# Patient Record
Sex: Female | Born: 1935 | Race: White | Hispanic: No | Marital: Married | Smoking: Never smoker
Health system: Southern US, Community
[De-identification: ages and names within clinical notes are randomized; demographics above are authoritative.]

## PROBLEM LIST (undated history)

## (undated) DIAGNOSIS — R10813 Right lower quadrant abdominal tenderness: Secondary | ICD-10-CM

## (undated) DIAGNOSIS — I10 Essential (primary) hypertension: Secondary | ICD-10-CM

## (undated) DIAGNOSIS — S4990XA Unspecified injury of shoulder and upper arm, unspecified arm, initial encounter: Secondary | ICD-10-CM

## (undated) DIAGNOSIS — H6120 Impacted cerumen, unspecified ear: Secondary | ICD-10-CM

## (undated) DIAGNOSIS — M109 Gout, unspecified: Secondary | ICD-10-CM

## (undated) DIAGNOSIS — G4701 Insomnia due to medical condition: Secondary | ICD-10-CM

## (undated) DIAGNOSIS — B9689 Other specified bacterial agents as the cause of diseases classified elsewhere: Secondary | ICD-10-CM

## (undated) DIAGNOSIS — M7061 Trochanteric bursitis, right hip: Secondary | ICD-10-CM

## (undated) DIAGNOSIS — G8929 Other chronic pain: Secondary | ICD-10-CM

## (undated) DIAGNOSIS — E785 Hyperlipidemia, unspecified: Secondary | ICD-10-CM

## (undated) DIAGNOSIS — N281 Cyst of kidney, acquired: Secondary | ICD-10-CM

## (undated) DIAGNOSIS — K5909 Other constipation: Secondary | ICD-10-CM

## (undated) DIAGNOSIS — N289 Disorder of kidney and ureter, unspecified: Secondary | ICD-10-CM

## (undated) DIAGNOSIS — E1129 Type 2 diabetes mellitus with other diabetic kidney complication: Secondary | ICD-10-CM

## (undated) DIAGNOSIS — M19019 Primary osteoarthritis, unspecified shoulder: Secondary | ICD-10-CM

## (undated) DIAGNOSIS — M5416 Radiculopathy, lumbar region: Secondary | ICD-10-CM

## (undated) DIAGNOSIS — M545 Low back pain, unspecified: Secondary | ICD-10-CM

## (undated) DIAGNOSIS — J209 Acute bronchitis, unspecified: Secondary | ICD-10-CM

## (undated) DIAGNOSIS — M25551 Pain in right hip: Secondary | ICD-10-CM

## (undated) DIAGNOSIS — K922 Gastrointestinal hemorrhage, unspecified: Secondary | ICD-10-CM

## (undated) DIAGNOSIS — H6690 Otitis media, unspecified, unspecified ear: Secondary | ICD-10-CM

## (undated) DIAGNOSIS — I4819 Other persistent atrial fibrillation: Secondary | ICD-10-CM

## (undated) DIAGNOSIS — K219 Gastro-esophageal reflux disease without esophagitis: Secondary | ICD-10-CM

## (undated) DIAGNOSIS — N185 Chronic kidney disease, stage 5: Secondary | ICD-10-CM

## (undated) DIAGNOSIS — R9431 Abnormal electrocardiogram [ECG] [EKG]: Secondary | ICD-10-CM

## (undated) DIAGNOSIS — D62 Acute posthemorrhagic anemia: Secondary | ICD-10-CM

## (undated) DIAGNOSIS — K625 Hemorrhage of anus and rectum: Secondary | ICD-10-CM

## (undated) DIAGNOSIS — D259 Leiomyoma of uterus, unspecified: Secondary | ICD-10-CM

## (undated) DIAGNOSIS — N390 Urinary tract infection, site not specified: Secondary | ICD-10-CM

## (undated) HISTORY — DX: Essential (primary) hypertension: I10

## (undated) HISTORY — DX: Low back pain, unspecified: M54.50

## (undated) HISTORY — DX: Disorder of kidney and ureter, unspecified: N28.9

## (undated) HISTORY — DX: Low back pain: M54.5

## (undated) HISTORY — DX: Hyperlipidemia, unspecified: E78.5

---
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
## 1997-06-12 ENCOUNTER — Ambulatory Visit (HOSPITAL_COMMUNITY): Admission: RE | Admit: 1997-06-12 | Discharge: 1997-06-12 | Payer: Self-pay | Admitting: Family Medicine

## 1997-06-20 ENCOUNTER — Other Ambulatory Visit: Admission: RE | Admit: 1997-06-20 | Discharge: 1997-06-20 | Payer: Self-pay | Admitting: Family Medicine

## 1998-08-07 ENCOUNTER — Other Ambulatory Visit: Admission: RE | Admit: 1998-08-07 | Discharge: 1998-08-07 | Payer: Self-pay | Admitting: Family Medicine

## 1999-09-02 ENCOUNTER — Other Ambulatory Visit: Admission: RE | Admit: 1999-09-02 | Discharge: 1999-09-02 | Payer: Self-pay | Admitting: Family Medicine

## 1999-10-28 ENCOUNTER — Encounter: Admission: RE | Admit: 1999-10-28 | Discharge: 1999-10-28 | Payer: Self-pay | Admitting: Family Medicine

## 1999-10-28 ENCOUNTER — Encounter: Payer: Self-pay | Admitting: Family Medicine

## 2001-06-04 ENCOUNTER — Other Ambulatory Visit: Admission: RE | Admit: 2001-06-04 | Discharge: 2001-06-04 | Payer: Self-pay | Admitting: Family Medicine

## 2001-06-15 ENCOUNTER — Encounter: Payer: Self-pay | Admitting: Family Medicine

## 2001-06-15 ENCOUNTER — Encounter: Admission: RE | Admit: 2001-06-15 | Discharge: 2001-06-15 | Payer: Self-pay | Admitting: Family Medicine

## 2002-07-10 ENCOUNTER — Other Ambulatory Visit: Admission: RE | Admit: 2002-07-10 | Discharge: 2002-07-10 | Payer: Self-pay | Admitting: Family Medicine

## 2002-07-22 ENCOUNTER — Encounter: Payer: Self-pay | Admitting: Family Medicine

## 2002-07-22 ENCOUNTER — Encounter: Admission: RE | Admit: 2002-07-22 | Discharge: 2002-07-22 | Payer: Self-pay | Admitting: Family Medicine

## 2003-02-24 LAB — HM COLONOSCOPY: HM Colonoscopy: NORMAL

## 2005-04-27 ENCOUNTER — Other Ambulatory Visit: Admission: RE | Admit: 2005-04-27 | Discharge: 2005-04-27 | Payer: Self-pay | Admitting: Family Medicine

## 2005-05-31 ENCOUNTER — Encounter: Admission: RE | Admit: 2005-05-31 | Discharge: 2005-05-31 | Payer: Self-pay | Admitting: Family Medicine

## 2006-09-10 ENCOUNTER — Encounter: Admission: RE | Admit: 2006-09-10 | Discharge: 2006-09-10 | Payer: Self-pay | Admitting: Family Medicine

## 2006-09-25 ENCOUNTER — Encounter: Admission: RE | Admit: 2006-09-25 | Discharge: 2006-09-25 | Payer: Self-pay | Admitting: Family Medicine

## 2007-09-28 ENCOUNTER — Other Ambulatory Visit: Admission: RE | Admit: 2007-09-28 | Discharge: 2007-09-28 | Payer: Self-pay | Admitting: Family Medicine

## 2007-09-28 ENCOUNTER — Encounter: Payer: Self-pay | Admitting: Internal Medicine

## 2007-10-03 LAB — CONVERTED CEMR LAB: Pap Smear: NORMAL

## 2007-11-01 ENCOUNTER — Encounter: Payer: Self-pay | Admitting: Internal Medicine

## 2007-11-01 ENCOUNTER — Encounter: Admission: RE | Admit: 2007-11-01 | Discharge: 2007-11-01 | Payer: Self-pay | Admitting: Family Medicine

## 2008-08-25 ENCOUNTER — Ambulatory Visit: Payer: Self-pay | Admitting: Internal Medicine

## 2008-08-25 DIAGNOSIS — N3281 Overactive bladder: Secondary | ICD-10-CM

## 2008-08-25 DIAGNOSIS — I10 Essential (primary) hypertension: Secondary | ICD-10-CM

## 2008-08-25 DIAGNOSIS — N185 Chronic kidney disease, stage 5: Secondary | ICD-10-CM | POA: Insufficient documentation

## 2008-08-25 DIAGNOSIS — M109 Gout, unspecified: Secondary | ICD-10-CM | POA: Insufficient documentation

## 2008-08-25 DIAGNOSIS — R9431 Abnormal electrocardiogram [ECG] [EKG]: Secondary | ICD-10-CM

## 2008-08-25 DIAGNOSIS — M546 Pain in thoracic spine: Secondary | ICD-10-CM

## 2008-08-25 DIAGNOSIS — E1129 Type 2 diabetes mellitus with other diabetic kidney complication: Secondary | ICD-10-CM

## 2008-08-25 HISTORY — DX: Chronic kidney disease, stage 5: N18.5

## 2008-08-25 HISTORY — DX: Abnormal electrocardiogram (ECG) (EKG): R94.31

## 2008-08-25 HISTORY — DX: Type 2 diabetes mellitus with other diabetic kidney complication: E11.29

## 2008-08-25 HISTORY — DX: Essential (primary) hypertension: I10

## 2008-08-25 HISTORY — DX: Gout, unspecified: M10.9

## 2008-08-25 LAB — CONVERTED CEMR LAB
ALT: 18 units/L (ref 0–35)
AST: 28 units/L (ref 0–37)
Albumin: 3.9 g/dL (ref 3.5–5.2)
Basophils Relative: 0.8 % (ref 0.0–3.0)
Eosinophils Relative: 6.3 % — ABNORMAL HIGH (ref 0.0–5.0)
GFR calc non Af Amer: 25.78 mL/min (ref >60)
Glucose, Bld: 108 mg/dL — ABNORMAL HIGH (ref 70–99)
HCT: 36.9 % (ref 36.0–46.0)
Hemoglobin: 12.6 g/dL (ref 12.0–15.0)
Ketones, ur: NEGATIVE mg/dL
LDL Cholesterol: 76 mg/dL (ref 0–99)
Lymphs Abs: 1.4 10*3/uL (ref 0.7–4.0)
Monocytes Relative: 6.7 % (ref 3.0–12.0)
Neutro Abs: 2.2 10*3/uL (ref 1.4–7.7)
Potassium: 3.9 meq/L (ref 3.5–5.1)
Sodium: 145 meq/L (ref 135–145)
Specific Gravity, Urine: 1.015 (ref 1.000–1.030)
TSH: 1 microintl units/mL (ref 0.35–5.50)
Urine Glucose: NEGATIVE mg/dL
Urobilinogen, UA: 0.2 (ref 0.0–1.0)
VLDL: 27.4 mg/dL (ref 0.0–40.0)
WBC: 4.2 10*3/uL — ABNORMAL LOW (ref 4.5–10.5)

## 2008-08-28 ENCOUNTER — Telehealth (INDEPENDENT_AMBULATORY_CARE_PROVIDER_SITE_OTHER): Payer: Self-pay | Admitting: *Deleted

## 2008-09-01 ENCOUNTER — Ambulatory Visit: Payer: Self-pay

## 2008-09-02 ENCOUNTER — Encounter: Payer: Self-pay | Admitting: Internal Medicine

## 2008-09-24 ENCOUNTER — Ambulatory Visit: Payer: Self-pay | Admitting: Internal Medicine

## 2008-10-22 ENCOUNTER — Ambulatory Visit: Payer: Self-pay | Admitting: Internal Medicine

## 2008-11-05 ENCOUNTER — Telehealth: Payer: Self-pay | Admitting: Internal Medicine

## 2008-11-10 ENCOUNTER — Encounter: Payer: Self-pay | Admitting: Internal Medicine

## 2008-12-08 ENCOUNTER — Telehealth: Payer: Self-pay | Admitting: Internal Medicine

## 2009-01-22 ENCOUNTER — Ambulatory Visit: Payer: Self-pay | Admitting: Internal Medicine

## 2009-01-22 LAB — CONVERTED CEMR LAB
BUN: 31 mg/dL — ABNORMAL HIGH (ref 6–23)
Basophils Absolute: 0.1 10*3/uL (ref 0.0–0.1)
Bilirubin, Direct: 0.1 mg/dL (ref 0.0–0.3)
Chloride: 105 meq/L (ref 96–112)
Cholesterol: 158 mg/dL (ref 0–200)
Creatinine, Ser: 1.6 mg/dL — ABNORMAL HIGH (ref 0.4–1.2)
Eosinophils Absolute: 0.3 10*3/uL (ref 0.0–0.7)
Eosinophils Relative: 5 % (ref 0.0–5.0)
Glucose, Bld: 104 mg/dL — ABNORMAL HIGH (ref 70–99)
Hgb A1c MFr Bld: 5.9 % (ref 4.6–6.5)
LDL Cholesterol: 92 mg/dL (ref 0–99)
Leukocytes, UA: NEGATIVE
MCHC: 33.7 g/dL (ref 30.0–36.0)
MCV: 99.5 fL (ref 78.0–100.0)
Monocytes Absolute: 0.4 10*3/uL (ref 0.1–1.0)
Neutrophils Relative %: 52 % (ref 43.0–77.0)
Nitrite: NEGATIVE
Platelets: 211 10*3/uL (ref 150.0–400.0)
RDW: 13.1 % (ref 11.5–14.6)
Specific Gravity, Urine: 1.02 (ref 1.000–1.030)
Total Bilirubin: 0.9 mg/dL (ref 0.3–1.2)
Total Protein, Urine: 100 mg/dL
Triglycerides: 132 mg/dL (ref 0.0–149.0)
WBC: 5.3 10*3/uL (ref 4.5–10.5)
pH: 5.5 (ref 5.0–8.0)

## 2009-01-23 ENCOUNTER — Encounter: Payer: Self-pay | Admitting: Internal Medicine

## 2009-02-18 ENCOUNTER — Encounter: Payer: Self-pay | Admitting: Internal Medicine

## 2009-02-23 ENCOUNTER — Telehealth: Payer: Self-pay | Admitting: Internal Medicine

## 2009-04-02 ENCOUNTER — Telehealth: Payer: Self-pay | Admitting: Internal Medicine

## 2009-05-27 ENCOUNTER — Ambulatory Visit: Payer: Self-pay | Admitting: Internal Medicine

## 2009-05-27 LAB — CONVERTED CEMR LAB
ALT: 18 U/L (ref 0–35)
AST: 25 U/L (ref 0–37)
Albumin: 3.7 g/dL (ref 3.5–5.2)
Alkaline Phosphatase: 77 U/L (ref 39–117)
BUN: 32 mg/dL — ABNORMAL HIGH (ref 6–23)
Basophils Absolute: 0 K/uL (ref 0.0–0.1)
Basophils Relative: 0.7 % (ref 0.0–3.0)
Bilirubin Urine: NEGATIVE
Bilirubin, Direct: 0.2 mg/dL (ref 0.0–0.3)
CO2: 33 meq/L — ABNORMAL HIGH (ref 19–32)
Calcium: 9.8 mg/dL (ref 8.4–10.5)
Chloride: 106 meq/L (ref 96–112)
Cholesterol: 160 mg/dL (ref 0–200)
Creatinine, Ser: 1.8 mg/dL — ABNORMAL HIGH (ref 0.4–1.2)
Eosinophils Absolute: 0.2 K/uL (ref 0.0–0.7)
Eosinophils Relative: 4.5 % (ref 0.0–5.0)
GFR calc non Af Amer: 29.06 mL/min (ref >60)
Glucose, Bld: 111 mg/dL — ABNORMAL HIGH (ref 70–99)
HCT: 39.1 % (ref 36.0–46.0)
HDL: 54.1 mg/dL (ref >39.00)
Hemoglobin: 12.9 g/dL (ref 12.0–15.0)
Hgb A1c MFr Bld: 5.9 % (ref 4.6–6.5)
Ketones, ur: NEGATIVE mg/dL
LDL Cholesterol: 79 mg/dL (ref 0–99)
Leukocytes, UA: NEGATIVE
Lymphocytes Relative: 35 % (ref 12.0–46.0)
Lymphs Abs: 1.6 K/uL (ref 0.7–4.0)
MCHC: 33.1 g/dL (ref 30.0–36.0)
MCV: 96.5 fL (ref 78.0–100.0)
Monocytes Absolute: 0.3 K/uL (ref 0.1–1.0)
Monocytes Relative: 7.4 % (ref 3.0–12.0)
Neutro Abs: 2.5 K/uL (ref 1.4–7.7)
Neutrophils Relative %: 52.4 % (ref 43.0–77.0)
Nitrite: NEGATIVE
Platelets: 189 K/uL (ref 150.0–400.0)
Potassium: 4.3 meq/L (ref 3.5–5.1)
RBC: 4.05 M/uL (ref 3.87–5.11)
RDW: 12.5 % (ref 11.5–14.6)
Sodium: 145 meq/L (ref 135–145)
Specific Gravity, Urine: >=1.03 (ref 1.000–1.030)
TSH: 3 u[IU]/mL (ref 0.35–5.50)
Total Bilirubin: 0.4 mg/dL (ref 0.3–1.2)
Total CHOL/HDL Ratio: 3
Total Protein, Urine: 100 mg/dL
Total Protein: 6.7 g/dL (ref 6.0–8.3)
Triglycerides: 134 mg/dL (ref 0.0–149.0)
Uric Acid, Serum: 6.1 mg/dL (ref 2.4–7.0)
Urine Glucose: NEGATIVE mg/dL
Urobilinogen, UA: 0.2 (ref 0.0–1.0)
VLDL: 26.8 mg/dL (ref 0.0–40.0)
WBC: 4.6 10*3/microliter (ref 4.5–10.5)
pH: 6 (ref 5.0–8.0)

## 2009-08-13 LAB — HM MAMMOGRAPHY: HM Mammogram: NORMAL

## 2009-09-29 ENCOUNTER — Ambulatory Visit: Payer: Self-pay | Admitting: Internal Medicine

## 2009-09-29 LAB — CONVERTED CEMR LAB
BUN: 43 mg/dL — ABNORMAL HIGH (ref 6–23)
CO2: 33 meq/L — ABNORMAL HIGH (ref 19–32)
Calcium: 9.9 mg/dL (ref 8.4–10.5)
Creatinine, Ser: 2.1 mg/dL — ABNORMAL HIGH (ref 0.4–1.2)
GFR calc non Af Amer: 24.04 mL/min (ref >60)
Glucose, Bld: 101 mg/dL — ABNORMAL HIGH (ref 70–99)
Sodium: 144 meq/L (ref 135–145)

## 2009-09-30 ENCOUNTER — Encounter: Payer: Self-pay | Admitting: Internal Medicine

## 2009-10-20 ENCOUNTER — Telehealth: Payer: Self-pay | Admitting: Internal Medicine

## 2009-11-02 ENCOUNTER — Encounter
Admission: RE | Admit: 2009-11-02 | Discharge: 2010-01-31 | Payer: Self-pay | Source: Home / Self Care | Admitting: Physical Medicine & Rehabilitation

## 2009-11-09 ENCOUNTER — Ambulatory Visit: Payer: Self-pay | Admitting: Physical Medicine & Rehabilitation

## 2009-12-03 ENCOUNTER — Ambulatory Visit: Payer: Self-pay | Admitting: Physical Medicine & Rehabilitation

## 2009-12-30 ENCOUNTER — Ambulatory Visit: Payer: Self-pay | Admitting: Internal Medicine

## 2009-12-30 DIAGNOSIS — E785 Hyperlipidemia, unspecified: Secondary | ICD-10-CM | POA: Insufficient documentation

## 2009-12-30 HISTORY — DX: Hyperlipidemia, unspecified: E78.5

## 2009-12-30 LAB — CONVERTED CEMR LAB
ALT: 16 units/L (ref 0–35)
Albumin: 3.6 g/dL (ref 3.5–5.2)
Alkaline Phosphatase: 72 units/L (ref 39–117)
Basophils Relative: 1 % (ref 0.0–3.0)
Bilirubin Urine: NEGATIVE
Bilirubin, Direct: 0.1 mg/dL (ref 0.0–0.3)
CO2: 31 meq/L (ref 19–32)
Calcium: 10.1 mg/dL (ref 8.4–10.5)
Chloride: 110 meq/L (ref 96–112)
Creatinine, Ser: 2 mg/dL — ABNORMAL HIGH (ref 0.4–1.2)
Eosinophils Relative: 5.7 % — ABNORMAL HIGH (ref 0.0–5.0)
Hemoglobin: 12.8 g/dL (ref 12.0–15.0)
Ketones, ur: NEGATIVE mg/dL
LDL Cholesterol: 92 mg/dL (ref 0–99)
Lymphocytes Relative: 34.8 % (ref 12.0–46.0)
MCV: 98.2 fL (ref 78.0–100.0)
Neutro Abs: 2.8 10*3/uL (ref 1.4–7.7)
Neutrophils Relative %: 51.8 % (ref 43.0–77.0)
RBC: 3.92 M/uL (ref 3.87–5.11)
Sodium: 148 meq/L — ABNORMAL HIGH (ref 135–145)
Total CHOL/HDL Ratio: 4
Total Protein, Urine: 100 mg/dL
Total Protein: 6.4 g/dL (ref 6.0–8.3)
Triglycerides: 140 mg/dL (ref 0.0–149.0)
Urine Glucose: NEGATIVE mg/dL
WBC: 5.4 10*3/uL (ref 4.5–10.5)
pH: 6 (ref 5.0–8.0)

## 2010-01-05 ENCOUNTER — Ambulatory Visit: Payer: Self-pay | Admitting: Physical Medicine & Rehabilitation

## 2010-04-01 ENCOUNTER — Encounter
Admission: RE | Admit: 2010-04-01 | Discharge: 2010-04-06 | Payer: Self-pay | Source: Home / Self Care | Attending: Physical Medicine & Rehabilitation | Admitting: Physical Medicine & Rehabilitation

## 2010-04-02 ENCOUNTER — Ambulatory Visit
Admission: RE | Admit: 2010-04-02 | Discharge: 2010-04-02 | Payer: Self-pay | Source: Home / Self Care | Attending: Internal Medicine | Admitting: Internal Medicine

## 2010-04-02 ENCOUNTER — Other Ambulatory Visit: Payer: Self-pay | Admitting: Internal Medicine

## 2010-04-02 LAB — HEPATIC FUNCTION PANEL
ALT: 17 U/L (ref 0–35)
AST: 21 U/L (ref 0–37)
Albumin: 3.9 g/dL (ref 3.5–5.2)
Alkaline Phosphatase: 69 U/L (ref 39–117)
Bilirubin, Direct: 0.1 mg/dL (ref 0.0–0.3)
Total Bilirubin: 0.6 mg/dL (ref 0.3–1.2)
Total Protein: 6.8 g/dL (ref 6.0–8.3)

## 2010-04-02 LAB — BASIC METABOLIC PANEL
BUN: 32 mg/dL — ABNORMAL HIGH (ref 6–23)
CO2: 32 mEq/L (ref 19–32)
Calcium: 9.7 mg/dL (ref 8.4–10.5)
Chloride: 103 mEq/L (ref 96–112)
Creatinine, Ser: 1.9 mg/dL — ABNORMAL HIGH (ref 0.4–1.2)
GFR: 27.92 mL/min — ABNORMAL LOW (ref >60.00)
Glucose, Bld: 105 mg/dL — ABNORMAL HIGH (ref 70–99)
Potassium: 4.1 mEq/L (ref 3.5–5.1)
Sodium: 145 mEq/L (ref 135–145)

## 2010-04-02 LAB — URIC ACID: Uric Acid, Serum: 5.8 mg/dL (ref 2.4–7.0)

## 2010-04-02 LAB — HEMOGLOBIN A1C: Hgb A1c MFr Bld: 5.8 % (ref 4.6–6.5)

## 2010-04-06 ENCOUNTER — Ambulatory Visit
Admission: RE | Admit: 2010-04-06 | Discharge: 2010-04-06 | Payer: Self-pay | Source: Home / Self Care | Attending: Physical Medicine & Rehabilitation | Admitting: Physical Medicine & Rehabilitation

## 2010-04-13 NOTE — Assessment & Plan Note (Signed)
Summary: 3 MONTH FOLLOW UP-LB   Vital Signs:  Patient profile:   77 year old female Menstrual status:  postmenopausal Height:      66 inches Weight:      215 pounds BMI:     34.83 O2 Sat:      97 % on Room air Temp:     97.8 degrees F oral Pulse rate:   64 / minute Pulse rhythm:   regular Resp:     16 per minute BP sitting:   128 / 70  (left arm) Cuff size:   large  Vitals Entered By: Estell Harpin CMA (December 30, 2009 9:39 AM)  Nutrition Counseling: Patient's BMI is greater than 25 and therefore counseled on weight management options.  O2 Flow:  Room air CC: follow-up visit, Preventive Care Is Patient Diabetic? Yes Did you bring your meter with you today? No Pain Assessment Patient in pain? no       Does patient need assistance? Functional Status Self care Ambulation Normal     Menstrual Status postmenopausal Last PAP Result Normal   Primary Care Provider:  [name] MD  CC:  follow-up visit and Preventive Care.  History of Present Illness:  Follow-Up Visit      This is a 77 year old woman who presents for Follow-up visit.  The patient denies chest pain, palpitations, dizziness, syncope, low blood sugar symptoms, high blood sugar symptoms, edema, SOB, DOE, PND, and orthopnea.  Since the last visit the patient notes no new problems or concerns.  The patient reports taking meds as prescribed, monitoring BP, monitoring blood sugars, and dietary compliance.  When questioned about possible medication side effects, the patient notes none.    Preventive Screening-Counseling & Management  Alcohol-Tobacco     Alcohol drinks/day: 0     Smoking Status: never     Tobacco Counseling: not indicated; no tobacco use  Hep-HIV-STD-Contraception     Hepatitis Risk: no risk noted     HIV Risk: no risk noted     STD Risk: no risk noted      Drug Use:  no.    Clinical Review Panels:  Prevention   Last Mammogram:  Normal Bilateral (08/13/2009)   Last Pap Smear:   Normal (10/03/2007)   Last Colonoscopy:  Normal (02/24/2003)  Immunizations   Last Tetanus Booster:  Tdap (08/25/2008)   Last Pneumovax:  Pneumovax (08/25/2008)  Lipid Management   Cholesterol:  160 (05/27/2009)   LDL (bad choesterol):  79 (05/27/2009)   HDL (good cholesterol):  54.10 (05/27/2009)  Diabetes Management   HgBA1C:  5.8 (09/29/2009)   Creatinine:  2.1 (09/29/2009)   Last Dilated Eye Exam:  normal (02/18/2009)   Last Foot Exam:  yes (12/30/2009)   Last Pneumovax:  Pneumovax (08/25/2008)  CBC   WBC:  4.6 (05/27/2009)   RBC:  4.05 (05/27/2009)   Hgb:  12.9 (05/27/2009)   Hct:  39.1 (05/27/2009)   Platelets:  189.0 (05/27/2009)   MCV  96.5 (05/27/2009)   MCHC  33.1 (05/27/2009)   RDW  12.5 (05/27/2009)   PMN:  52.4 (05/27/2009)   Lymphs:  35.0 (05/27/2009)   Monos:  7.4 (05/27/2009)   Eosinophils:  4.5 (05/27/2009)   Basophil:  0.7 (05/27/2009)  Complete Metabolic Panel   Glucose:  101 (09/29/2009)   Sodium:  144 (09/29/2009)   Potassium:  4.2 (09/29/2009)   Chloride:  108 (09/29/2009)   CO2:  33 (09/29/2009)   BUN:  43 (09/29/2009)   Creatinine:  2.1 (09/29/2009)   Albumin:  3.7 (05/27/2009)   Total Protein:  6.7 (05/27/2009)   Calcium:  9.9 (09/29/2009)   Total Bili:  0.4 (05/27/2009)   Alk Phos:  77 (05/27/2009)   SGPT (ALT):  18 (05/27/2009)   SGOT (AST):  25 (05/27/2009)   Medications Prior to Update: 1)  Lisinopril-Hydrochlorothiazide 20-25 Mg Tabs (Lisinopril-Hydrochlorothiazide) .... Take 1 Tablet By Mouth Once A Day 2)  Allopurinol 300 Mg Tabs (Allopurinol) .... 1/2 Once Daily 3)  Centrium Silver 4)  Prilosec Otc 5)  Klonopin 0.5 Mg Tabs (Clonazepam) .... One By Mouth Two Times A Day As Needed For Anxiety 6)  Bystolic 5 Mg Tabs (Nebivolol Hcl) .... Once Daily 7)  Bayer Contour Monitor W/device Kit (Blood Glucose Monitoring Suppl) .... Use Bid 8)  Bayer Contour Test  Strp (Glucose Blood) .... Use Two Times A Day 9)  Actos 15 Mg Tabs  (Pioglitazone Hcl) .... Once Daily 10)  Tramadol Hcl 50 Mg Tabs (Tramadol Hcl) .Marland Kitchen.. 1-2 By Mouth Qid As Needed For Low Back Pain  Current Medications (verified): 1)  Lisinopril-Hydrochlorothiazide 20-25 Mg Tabs (Lisinopril-Hydrochlorothiazide) .... Take 1 Tablet By Mouth Once A Day 2)  Allopurinol 300 Mg Tabs (Allopurinol) .... 1/2 Once Daily 3)  Centrium Silver 4)  Prilosec Otc 5)  Klonopin 0.5 Mg Tabs (Clonazepam) .... One By Mouth Two Times A Day As Needed For Anxiety 6)  Bystolic 5 Mg Tabs (Nebivolol Hcl) .... Once Daily 7)  Bayer Contour Monitor W/device Kit (Blood Glucose Monitoring Suppl) .... Use Bid 8)  Bayer Contour Test  Strp (Glucose Blood) .... Use Two Times A Day 9)  Tramadol Hcl 50 Mg Tabs (Tramadol Hcl) .Marland Kitchen.. 1-2 By Mouth Qid As Needed For Low Back Pain 10)  Januvia 100 Mg Tabs (Sitagliptin Phosphate) .... One By Mouth Once Daily For Diabetes 11)  Crestor 10 Mg Tabs (Rosuvastatin Calcium) .... One By Mouth Once Daily For Cholesterol  Allergies (verified): 1)  ! Metformin Hcl  Past History:  Past Surgical History: Last updated: 08/25/2008 Denies surgical history  Family History: Last updated: 09/24/2008 Family History Diabetes 1st degree relative Family History Hypertension  Social History: Last updated: 09/24/2008 Retired Never Smoked Alcohol use-no Drug use-no Regular exercise-no  Risk Factors: Alcohol Use: 0 (12/30/2009) Exercise: no (09/24/2008)  Risk Factors: Smoking Status: never (12/30/2009)  Past Medical History: Diabetes mellitus, type II Gout Hypertension Low back pain- foraminal narrowing at L4-5 and disc bulge at L5-S1 on MRI 08/2006 Renal insufficiency Urinary incontinence Hyperlipidemia  Family History: Reviewed history from 09/24/2008 and no changes required. Family History Diabetes 1st degree relative Family History Hypertension  Social History: Reviewed history from 09/24/2008 and no changes required. Retired Never  Smoked Alcohol use-no Drug use-no Regular exercise-no Hepatitis Risk:  no risk noted HIV Risk:  no risk noted STD Risk:  no risk noted  Review of Systems  The patient denies anorexia, fever, weight loss, weight gain, hoarseness, chest pain, syncope, dyspnea on exertion, prolonged cough, headaches, hemoptysis, abdominal pain, hematuria, suspicious skin lesions, difficulty walking, depression, enlarged lymph nodes, and angioedema.   MS:  Denies joint pain, joint redness, joint swelling, loss of strength, low back pain, muscle aches, muscle, cramps, and stiffness. Endo:  Denies cold intolerance, excessive hunger, excessive thirst, excessive urination, heat intolerance, polyuria, and weight change.  Physical Exam  General:  alert, well-developed, well-nourished, well-hydrated, cooperative to examination, good hygiene, and overweight-appearing.   Head:  normocephalic and atraumatic.   Mouth:  Oral mucosa and oropharynx without  lesions or exudates.  Teeth in good repair. Neck:  supple, full ROM, no masses, no carotid bruits, no cervical lymphadenopathy, and no neck tenderness.   Lungs:  normal respiratory effort, no intercostal retractions, no accessory muscle use, normal breath sounds, and no dullness.   Heart:  Normal rate and regular rhythm. S1 and S2 normal without gallop, murmur, click, rub or other extra sounds. Abdomen:  Bowel sounds positive,abdomen soft and non-tender without masses, organomegaly or hernias noted. Msk:  normal ROM, no joint tenderness, no joint swelling, and no joint warmth.   Pulses:  R and L carotid,radial,femoral,dorsalis pedis and posterior tibial pulses are full and equal bilaterally Extremities:  trace left pedal edema and trace right pedal edema.   Neurologic:  alert & oriented X3, cranial nerves II-XII intact, strength normal in all extremities, sensation intact to light touch, sensation intact to pinprick, gait normal, and DTRs symmetrical and normal.   Skin:   Intact without suspicious lesions or rashes Cervical Nodes:  no anterior cervical adenopathy and no posterior cervical adenopathy.   Psych:  Cognition and judgment appear intact. Alert and cooperative with normal attention span and concentration. No apparent delusions, illusions, hallucinations  Diabetes Management Exam:    Foot Exam (with socks and/or shoes not present):       Sensory-Pinprick/Light touch:          Left medial foot (L-4): normal          Left dorsal foot (L-5): normal          Left lateral foot (S-1): normal          Right medial foot (L-4): normal          Right dorsal foot (L-5): normal          Right lateral foot (S-1): normal       Sensory-Monofilament:          Left foot: normal          Right foot: normal       Inspection:          Left foot: normal          Right foot: normal       Nails:          Left foot: normal          Right foot: normal   Impression & Recommendations:  Problem # 1:  RENAL INSUFFICIENCY (ICD-588.9) Assessment Unchanged  Orders: Venipuncture [id number]) TLB-Lipid Panel (80061-LIPID) TLB-BMP (Basic Metabolic Panel-BMET) (99991111) TLB-CBC Platelet - w/Differential (85025-CBCD) TLB-Hepatic/Liver Function Pnl (80076-HEPATIC) TLB-TSH (Thyroid Stimulating Hormone) (84443-TSH) TLB-A1C / Hgb A1C (Glycohemoglobin) (83036-A1C) TLB-Udip w/ Micro (81001-URINE)  Problem # 2:  HYPERTENSION (ICD-401.9) Assessment: Improved  Her updated medication list for this problem includes:    Lisinopril-hydrochlorothiazide 20-25 Mg Tabs (Lisinopril-hydrochlorothiazide) .Marland Kitchen... Take 1 tablet by mouth once a day    Bystolic 5 Mg Tabs (Nebivolol hcl) ..... Once daily  Orders: Venipuncture [id number]) TLB-Lipid Panel (80061-LIPID) TLB-BMP (Basic Metabolic Panel-BMET) (99991111) TLB-CBC Platelet - w/Differential (85025-CBCD) TLB-Hepatic/Liver Function Pnl (80076-HEPATIC) TLB-TSH (Thyroid Stimulating Hormone) (84443-TSH) TLB-A1C / Hgb A1C  (Glycohemoglobin) (83036-A1C) TLB-Udip w/ Micro (81001-URINE)  BP today: 128/70 Prior BP: 122/84 (09/29/2009)  Prior 10 Yr Risk Heart Disease: 17 % (05/27/2009)  Labs Reviewed: K+: 4.2 (09/29/2009) Creat: : 2.1 (09/29/2009)   Chol: 160 (05/27/2009)   HDL: 54.10 (05/27/2009)   LDL: 79 (05/27/2009)   TG: 134.0 (05/27/2009)  Problem # 3:  DIABETES MELLITUS, TYPE II (ICD-250.00) Assessment: Unchanged  The  following medications were removed from the medication list:    Actos 15 Mg Tabs (Pioglitazone hcl) ..... Once daily Her updated medication list for this problem includes:    Lisinopril-hydrochlorothiazide 20-25 Mg Tabs (Lisinopril-hydrochlorothiazide) .Marland Kitchen... Take 1 tablet by mouth once a day    Januvia 100 Mg Tabs (Sitagliptin phosphate) ..... One by mouth once daily for diabetes  Orders: Venipuncture [id number]) TLB-Lipid Panel (80061-LIPID) TLB-BMP (Basic Metabolic Panel-BMET) (99991111) TLB-CBC Platelet - w/Differential (85025-CBCD) TLB-Hepatic/Liver Function Pnl (80076-HEPATIC) TLB-TSH (Thyroid Stimulating Hormone) (84443-TSH) TLB-A1C / Hgb A1C (Glycohemoglobin) (83036-A1C) TLB-Udip w/ Micro (81001-URINE)  Labs Reviewed: Creat: 2.1 (09/29/2009)     Last Eye Exam: normal (02/18/2009) Reviewed HgBA1c results: 5.8 (09/29/2009)  5.9 (05/27/2009)  Problem # 4:  GOUT (ICD-274.9) Assessment: Improved  Her updated medication list for this problem includes:    Allopurinol 300 Mg Tabs (Allopurinol) .Marland Kitchen... 1/2 once daily  Elevate extremity; warm compresses, symptomatic relief and medication as directed.   Problem # 5:  HYPERLIPIDEMIA (B2193296.4) Assessment: Unchanged  Her updated medication list for this problem includes:    Crestor 10 Mg Tabs (Rosuvastatin calcium) ..... One by mouth once daily for cholesterol  Labs Reviewed: SGOT: 25 (05/27/2009)   SGPT: 18 (05/27/2009)  Prior 10 Yr Risk Heart Disease: 17 % (05/27/2009)   HDL:54.10 (05/27/2009), 39.90 (01/22/2009)   LDL:79 (05/27/2009), 92 (01/22/2009)  Chol:160 (05/27/2009), 158 (01/22/2009)  Trig:134.0 (05/27/2009), 132.0 (01/22/2009)  Complete Medication List: 1)  Lisinopril-hydrochlorothiazide 20-25 Mg Tabs (Lisinopril-hydrochlorothiazide) .... Take 1 tablet by mouth once a day 2)  Allopurinol 300 Mg Tabs (Allopurinol) .... 1/2 once daily 3)  Centrium Silver  4)  Prilosec Otc  5)  Klonopin 0.5 Mg Tabs (Clonazepam) .... One by mouth two times a day as needed for anxiety 6)  Bystolic 5 Mg Tabs (Nebivolol hcl) .... Once daily 7)  Landscape architect W/device Kit (Blood glucose monitoring suppl) .... Use bid 8)  Bayer Contour Test Strp (Glucose blood) .... Use two times a day 9)  Tramadol Hcl 50 Mg Tabs (Tramadol hcl) .Marland Kitchen.. 1-2 by mouth qid as needed for low back pain 10)  Januvia 100 Mg Tabs (Sitagliptin phosphate) .... One by mouth once daily for diabetes 11)  Crestor 10 Mg Tabs (Rosuvastatin calcium) .... One by mouth once daily for cholesterol  Colorectal Screening:  Current Recommendations:    Colonoscopy recommended: patient defers today but will consider in the future  PAP Screening:    Hx Cervical Dysplasia in last 5 yrs? No    3 normal PAP smears in last 5 yrs? Yes    Last PAP smear:  10/03/2007    Reviewed PAP smear recommendations:  patient refuses understanding risks of delayed diagnosis  Mammogram Screening:    Last Mammogram:  08/13/2009  Mammogram Results:    Date of Exam:  08/13/2009    Results:  Normal Bilateral  Osteoporosis Risk Assessment:  Risk Factors for Fracture or Low Bone Density:   Race (White or Asian):     yes   Smoking status:       never  Immunization & Chemoprophylaxis:    Tetanus vaccine: Tdap  (08/25/2008)    Pneumovax: Pneumovax  (08/25/2008)  Patient Instructions: 1)  Please schedule a follow-up appointment in 3 months. 2)  It is important that you exercise regularly at least 20 minutes 5 times a week. If you develop chest pain, have severe  difficulty breathing, or feel very tired , stop exercising immediately and seek medical attention. 3)  You need to  lose weight. Consider a lower calorie diet and regular exercise.  4)  Check your blood sugars regularly. If your readings are usually above 200 or below 70 you should contact our office. 5)  It is important that your Diabetic A1c level is checked every 3 months. 6)  See your eye doctor yearly to check for diabetic eye damage. 7)  Check your feet each night for sore areas, calluses or signs of infection. 8)  Check your Blood Pressure regularly. If it is above 130/80: you should make an appointment. Prescriptions: CRESTOR 10 MG TABS (ROSUVASTATIN CALCIUM) One by mouth once daily for cholesterol  #84 x 0   Entered and Authorized by:   [name] MD   Signed by:   [name] MD on 12/30/2009   Method used:   Samples Given   RxID:   [id number] JANUVIA 100 MG TABS (SITAGLIPTIN PHOSPHATE) One by mouth once daily for diabetes  #140 x 0   Entered and Authorized by:   [name] MD   Signed by:   [name] MD on 12/30/2009   Method used:   Samples Given   RxID:   [id number]    Orders Added: 1)  Venipuncture [id number] 2)  TLB-Lipid Panel [80061-LIPID] 3)  TLB-BMP (Basic Metabolic Panel-BMET) [id number] 4)  TLB-CBC Platelet - w/Differential [85025-CBCD] 5)  TLB-Hepatic/Liver Function Pnl [80076-HEPATIC] 6)  TLB-TSH (Thyroid Stimulating Hormone) [84443-TSH] 7)  TLB-A1C / Hgb A1C (Glycohemoglobin) [83036-A1C] 8)  TLB-Udip w/ Micro [81001-URINE] 9)  Est. Patient Level IV [id number]

## 2010-04-13 NOTE — Letter (Signed)
Summary: Results Follow-up Letter  [facility]   [place], [place] [address]   Phone: [phone]  Fax: [phone]    09/30/2009  [address] [place], [place]  [address]  Dear Ms. HOGLEN,   The following are the results of your recent test(s):  Test     Result     Kidney function   a little worse Blood sugars   good control   _________________________________________________________  Please call for an appointment as directed _________________________________________________________ _________________________________________________________ _________________________________________________________  Sincerely,  [name] MD [facility]

## 2010-04-13 NOTE — Progress Notes (Signed)
  Phone Note Refill Request Message from:  Fax from Pharmacy on October 20, 2009 1:14 PM  Refills Requested: Medication #1:  KLONOPIN 0.5 MG TABS One by mouth two times a day as needed for anxiety   Dosage confirmed as above?Dosage Confirmed   Supply Requested: 1 month   Last Refilled: 02/24/2009   Notes: last given #60/3rf  Is this ok to refill? [facility]   Method Requested: Telephone to Pharmacy Next Appointment Scheduled: 12/30/2009 Initial call taken by: Estell Harpin CMA,  October 20, 2009 1:14 PM  Follow-up for Phone Call        yes Follow-up by: [name] MD,  October 21, 2009 7:37 AM    Prescriptions: KLONOPIN 0.5 MG TABS (CLONAZEPAM) One by mouth two times a day as needed for anxiety  #60 x 4   Entered by:   Estell Harpin CMA   Authorized by:   [name] MD   Signed by:   Estell Harpin CMA on 10/21/2009   Method used:   Telephoned to ...       [id number] (retail)       [address]       [place], [place]  [address]       Ph: [phone]       Fax: [phone]   RxID:   [id number]

## 2010-04-13 NOTE — Assessment & Plan Note (Signed)
Summary: 4 mos f/u // # / cd   Vital Signs:  Patient profile:   77 year old female Height:      66 inches Weight:      217 pounds BMI:     35.15 O2 Sat:      95 % on Room air Temp:     98.2 degrees F oral Pulse rate:   54 / minute Pulse rhythm:   regular Resp:     16 per minute BP sitting:   122 / 84  (left arm) Cuff size:   large  Vitals Entered By: Estell Harpin CMA (September 29, 2009 9:46 AM)  Nutrition Counseling: Patient's BMI is greater than 25 and therefore counseled on weight management options.  O2 Flow:  Room air CC: follow up/ lab results, Back pain Is Patient Diabetic? No Pain Assessment Patient in pain? no        Primary Care Provider:  [name] MD  CC:  follow up/ lab results and Back pain.  History of Present Illness:  Back Pain      This is a 77 year old woman who presents with Back pain.  The symptoms began 6-12 months ago.  The intensity is described as moderate.  The patient denies fever, chills, weakness, loss of sensation, fecal incontinence, urinary incontinence, urinary retention, dysuria, rest pain, inability to work, and inability to care for self.  The pain is located in the left low back.  The pain began gradually.  The pain radiates to the left hip.  The pain is made worse by standing or walking.  The pain is made better by inactivity and NSAID medications.  Risk factors for serious underlying conditions include duration of pain > 1 month, bedrest with no relief, and age >= 50 years.    Preventive Screening-Counseling & Management  Alcohol-Tobacco     Alcohol drinks/day: 0     Smoking Status: never  Clinical Review Panels:  Lipid Management   Cholesterol:  160 (05/27/2009)   LDL (bad choesterol):  79 (05/27/2009)   HDL (good cholesterol):  54.10 (05/27/2009)  Diabetes Management   HgBA1C:  5.9 (05/27/2009)   Creatinine:  1.8 (05/27/2009)   Last Dilated Eye Exam:  normal (02/18/2009)   Last Foot Exam:  yes (09/29/2009)   Last  Pneumovax:  Pneumovax (08/25/2008)  CBC   WBC:  4.6 (05/27/2009)   RBC:  4.05 (05/27/2009)   Hgb:  12.9 (05/27/2009)   Hct:  39.1 (05/27/2009)   Platelets:  189.0 (05/27/2009)   MCV  96.5 (05/27/2009)   MCHC  33.1 (05/27/2009)   RDW  12.5 (05/27/2009)   PMN:  52.4 (05/27/2009)   Lymphs:  35.0 (05/27/2009)   Monos:  7.4 (05/27/2009)   Eosinophils:  4.5 (05/27/2009)   Basophil:  0.7 (05/27/2009)  Complete Metabolic Panel   Glucose:  111 (05/27/2009)   Sodium:  145 (05/27/2009)   Potassium:  4.3 (05/27/2009)   Chloride:  106 (05/27/2009)   CO2:  33 (05/27/2009)   BUN:  32 (05/27/2009)   Creatinine:  1.8 (05/27/2009)   Albumin:  3.7 (05/27/2009)   Total Protein:  6.7 (05/27/2009)   Calcium:  9.8 (05/27/2009)   Total Bili:  0.4 (05/27/2009)   Alk Phos:  77 (05/27/2009)   SGPT (ALT):  18 (05/27/2009)   SGOT (AST):  25 (05/27/2009)   Medications Prior to Update: 1)  Lisinopril-Hydrochlorothiazide 20-25 Mg Tabs (Lisinopril-Hydrochlorothiazide) .... Take 1 Tablet By Mouth Once A Day 2)  Allopurinol 300  Mg Tabs (Allopurinol) .... 1/2 Once Daily 3)  Centrium Silver 4)  Prilosec Otc 5)  Klonopin 0.5 Mg Tabs (Clonazepam) .... One By Mouth Two Times A Day As Needed For Anxiety 6)  Bystolic 5 Mg Tabs (Nebivolol Hcl) .... Once Daily 7)  Bayer Contour Monitor W/device Kit (Blood Glucose Monitoring Suppl) .... Use Bid 8)  Bayer Contour Test  Strp (Glucose Blood) .... Use Two Times A Day 9)  Actos 15 Mg Tabs (Pioglitazone Hcl) .... Once Daily  Current Medications (verified): 1)  Lisinopril-Hydrochlorothiazide 20-25 Mg Tabs (Lisinopril-Hydrochlorothiazide) .... Take 1 Tablet By Mouth Once A Day 2)  Allopurinol 300 Mg Tabs (Allopurinol) .... 1/2 Once Daily 3)  Centrium Silver 4)  Prilosec Otc 5)  Klonopin 0.5 Mg Tabs (Clonazepam) .... One By Mouth Two Times A Day As Needed For Anxiety 6)  Bystolic 5 Mg Tabs (Nebivolol Hcl) .... Once Daily 7)  Bayer Contour Monitor W/device Kit (Blood  Glucose Monitoring Suppl) .... Use Bid 8)  Bayer Contour Test  Strp (Glucose Blood) .... Use Two Times A Day 9)  Actos 15 Mg Tabs (Pioglitazone Hcl) .... Once Daily 10)  Tramadol Hcl 50 Mg Tabs (Tramadol Hcl) .Marland Kitchen.. 1-2 By Mouth Qid As Needed For Low Back Pain  Allergies (verified): 1)  ! Metformin Hcl  Past History:  Past Medical History: Last updated: 08/25/2008 Diabetes mellitus, type II Gout Hypertension Low back pain- foraminal narrowing at L4-5 and disc bulge at L5-S1 on MRI 08/2006 Renal insufficiency Urinary incontinence  Past Surgical History: Last updated: 08/25/2008 Denies surgical history  Family History: Last updated: 09/24/2008 Family History Diabetes 1st degree relative Family History Hypertension  Social History: Last updated: 09/24/2008 Retired Never Smoked Alcohol use-no Drug use-no Regular exercise-no  Risk Factors: Alcohol Use: 0 (09/29/2009) Exercise: no (09/24/2008)  Risk Factors: Smoking Status: never (09/29/2009)  Family History: Reviewed history from 09/24/2008 and no changes required. Family History Diabetes 1st degree relative Family History Hypertension  Social History: Reviewed history from 09/24/2008 and no changes required. Retired Never Smoked Alcohol use-no Drug use-no Regular exercise-no  Review of Systems       The patient complains of weight gain.  The patient denies anorexia, fever, weight loss, chest pain, syncope, dyspnea on exertion, peripheral edema, prolonged cough, headaches, hemoptysis, abdominal pain, suspicious skin lesions, difficulty walking, and depression.   Endo:  Denies cold intolerance, excessive hunger, excessive thirst, excessive urination, heat intolerance, polyuria, and weight change.  Physical Exam  General:  alert, well-developed, well-nourished, well-hydrated, cooperative to examination, good hygiene, and overweight-appearing.   Mouth:  Oral mucosa and oropharynx without lesions or exudates.   Teeth in good repair. Neck:  supple, full ROM, no masses, no carotid bruits, no cervical lymphadenopathy, and no neck tenderness.   Lungs:  normal respiratory effort, no intercostal retractions, no accessory muscle use, normal breath sounds, and no dullness.   Heart:  Normal rate and regular rhythm. S1 and S2 normal without gallop, murmur, click, rub or other extra sounds. Abdomen:  Bowel sounds positive,abdomen soft and non-tender without masses, organomegaly or hernias noted. Msk:  normal ROM, no joint tenderness, no joint swelling, and no joint warmth.   Pulses:  R and L carotid,radial,femoral,dorsalis pedis and posterior tibial pulses are full and equal bilaterally Extremities:  trace left pedal edema and trace right pedal edema.   Neurologic:  alert & oriented X3, cranial nerves II-XII intact, strength normal in all extremities, sensation intact to light touch, sensation intact to pinprick, gait normal, and DTRs  symmetrical and normal.   Skin:  Intact without suspicious lesions or rashes Cervical Nodes:  no anterior cervical adenopathy and no posterior cervical adenopathy.   Axillary Nodes:  no R axillary adenopathy and no L axillary adenopathy.   Inguinal Nodes:  no R inguinal adenopathy and no L inguinal adenopathy.   Psych:  Cognition and judgment appear intact. Alert and cooperative with normal attention span and concentration. No apparent delusions, illusions, hallucinations  Diabetes Management Exam:    Foot Exam (with socks and/or shoes not present):       Sensory-Pinprick/Light touch:          Left medial foot (L-4): normal          Left dorsal foot (L-5): normal          Left lateral foot (S-1): normal          Right medial foot (L-4): normal          Right dorsal foot (L-5): normal          Right lateral foot (S-1): normal       Sensory-Monofilament:          Left foot: normal          Right foot: normal       Inspection:          Left foot: normal          Right foot:  normal       Nails:          Left foot: normal          Right foot: normal   Impression & Recommendations:  Problem # 1:  LOW BACK PAIN (ICD-724.2) Assessment Deteriorated  Her updated medication list for this problem includes:    Tramadol Hcl 50 Mg Tabs (Tramadol hcl) .Marland Kitchen... 1-2 by mouth qid as needed for low back pain  Orders: Pain Clinic Referral (Pain)  Problem # 2:  RENAL INSUFFICIENCY (ICD-588.9) Assessment: Unchanged  Orders: Venipuncture [id number]) TLB-BMP (Basic Metabolic Panel-BMET) (99991111) TLB-A1C / Hgb A1C (Glycohemoglobin) (83036-A1C)  Problem # 3:  HYPERTENSION (ICD-401.9) Assessment: Improved  Her updated medication list for this problem includes:    Lisinopril-hydrochlorothiazide 20-25 Mg Tabs (Lisinopril-hydrochlorothiazide) .Marland Kitchen... Take 1 tablet by mouth once a day    Bystolic 5 Mg Tabs (Nebivolol hcl) ..... Once daily  Orders: Venipuncture [id number]) TLB-BMP (Basic Metabolic Panel-BMET) (99991111) TLB-A1C / Hgb A1C (Glycohemoglobin) (83036-A1C)  BP today: 122/84 Prior BP: 136/70 (05/27/2009)  Prior 10 Yr Risk Heart Disease: 17 % (05/27/2009)  Labs Reviewed: K+: 4.3 (05/27/2009) Creat: : 1.8 (05/27/2009)   Chol: 160 (05/27/2009)   HDL: 54.10 (05/27/2009)   LDL: 79 (05/27/2009)   TG: 134.0 (05/27/2009)  Problem # 4:  DIABETES MELLITUS, TYPE II (ICD-250.00) Assessment: Improved  Her updated medication list for this problem includes:    Lisinopril-hydrochlorothiazide 20-25 Mg Tabs (Lisinopril-hydrochlorothiazide) .Marland Kitchen... Take 1 tablet by mouth once a day    Actos 15 Mg Tabs (Pioglitazone hcl) ..... Once daily  Orders: Venipuncture [id number]) TLB-BMP (Basic Metabolic Panel-BMET) (99991111) TLB-A1C / Hgb A1C (Glycohemoglobin) (83036-A1C)  Labs Reviewed: Creat: 1.8 (05/27/2009)     Last Eye Exam: normal (02/18/2009) Reviewed HgBA1c results: 5.9 (05/27/2009)  5.9 (01/22/2009)  Complete Medication List: 1)  Lisinopril-hydrochlorothiazide  20-25 Mg Tabs (Lisinopril-hydrochlorothiazide) .... Take 1 tablet by mouth once a day 2)  Allopurinol 300 Mg Tabs (Allopurinol) .... 1/2 once daily 3)  Centrium Silver  4)  Prilosec Otc  5)  Klonopin 0.5 Mg Tabs (  Clonazepam) .... One by mouth two times a day as needed for anxiety 6)  Bystolic 5 Mg Tabs (Nebivolol hcl) .... Once daily 7)  Landscape architect W/device Kit (Blood glucose monitoring suppl) .... Use bid 8)  Bayer Contour Test Strp (Glucose blood) .... Use two times a day 9)  Actos 15 Mg Tabs (Pioglitazone hcl) .... Once daily 10)  Tramadol Hcl 50 Mg Tabs (Tramadol hcl) .Marland Kitchen.. 1-2 by mouth qid as needed for low back pain  Other Orders: Radiology Referral (Radiology)  Patient Instructions: 1)  Please schedule a follow-up appointment in 3 months. 2)  It is important that you exercise regularly at least 20 minutes 5 times a week. If you develop chest pain, have severe difficulty breathing, or feel very tired , stop exercising immediately and seek medical attention. 3)  You need to lose weight. Consider a lower calorie diet and regular exercise.  4)  Check your blood sugars regularly. If your readings are usually above 200  or below 70 you should contact our office. 5)  It is important that your Diabetic A1c level is checked every 3 months. 6)  See your eye doctor yearly to check for diabetic eye damage. 7)  Check your feet each night for sore areas, calluses or signs of infection. 8)  Check your Blood Pressure regularly. If it is above 130/80: you should make an appointment. Prescriptions: TRAMADOL HCL 50 MG TABS (TRAMADOL HCL) 1-2 by mouth QID as needed for low back pain  #50 x 11   Entered and Authorized by:   [name] MD   Signed by:   [name] MD on 09/29/2009   Method used:   Print then Give to Patient   RxID:   [id number] BAYER CONTOUR TEST  STRP (GLUCOSE BLOOD) Use two times a day  #60 x 11   Entered and Authorized by:   [name] MD   Signed  by:   [name] MD on 09/29/2009   Method used:   Print then Give to Patient   [id number]    Not Administered:    Influenza Vaccine not given due to: vaccine availability

## 2010-04-13 NOTE — Letter (Signed)
Summary: Lipid Letter  [facility]   [place], [place] [address]   Phone: [phone]  Fax: [phone]    12/30/2009  Danielle Frazier [place], [place]  [address]  Dear Ms. Michalec:  We have carefully reviewed your last lipid profile from 12/30/2009 and the results are noted below with a summary of recommendations for lipid management.    Cholesterol:       163     Goal: <200   HDL "good" Cholesterol:   43.50     Goal: >50   LDL "bad" Cholesterol:   92     Goal: <100   Triglycerides:       140.0     Goal: <150    your other labs look great    TLC Diet (Therapeutic Lifestyle Change): Saturated Fats & Transfatty acids should be kept < 7% of total calories ***Reduce Saturated Fats Polyunstaurated Fat can be up to 10% of total calories Monounsaturated Fat Fat can be up to 20% of total calories Total Fat should be no greater than 25-35% of total calories Carbohydrates should be 50-60% of total calories Protein should be approximately 15% of total calories Fiber should be at least 20-30 grams a day ***Increased fiber may help lower LDL Total Cholesterol should be < 200mg /day Consider adding plant stanol/sterols to diet (example: Benacol spread) ***A higher intake of unsaturated fat may reduce Triglycerides and Increase HDL    Adjunctive Measures (may lower LIPIDS and reduce risk of Heart Attack) include: Aerobic Exercise (20-30 minutes 3-4 times a week) Limit Alcohol Consumption Weight Reduction Aspirin 75-81 mg a day by mouth (if not allergic or contraindicated) Dietary Fiber 20-30 grams a day by mouth     Current Medications: 1)    Lisinopril-hydrochlorothiazide 20-25 Mg Tabs (Lisinopril-hydrochlorothiazide) .... Take 1 tablet by mouth once a day 2)    Allopurinol 300 Mg Tabs (Allopurinol) .... 1/2 once daily 3)    Centrium Silver  4)    Prilosec Otc  5)    Klonopin 0.5 Mg Tabs (Clonazepam) .... One by mouth two times a day as needed for  anxiety 6)    Bystolic 5 Mg Tabs (Nebivolol hcl) .... Once daily 7)    Landscape architect W/device Kit (Blood glucose monitoring suppl) .... Use bid 8)    Bayer Contour Test  Strp (Glucose blood) .... Use two times a day 9)    Tramadol Hcl 50 Mg Tabs (Tramadol hcl) .Marland Kitchen.. 1-2 by mouth qid as needed for low back pain 10)    Januvia 100 Mg Tabs (Sitagliptin phosphate) .... One by mouth once daily for diabetes 11)    Crestor 10 Mg Tabs (Rosuvastatin calcium) .... One by mouth once daily for cholesterol  If you have any questions, please call. We appreciate being able to work with you.   Sincerely,    [facility] [name] MD

## 2010-04-13 NOTE — Progress Notes (Signed)
  Phone Note Call from Patient   Summary of Call: Needs a RX of Actos 15mg  sent to [facility] on [address]. Initial call taken by: Gardenia Phlegm CMA,  April 02, 2009 11:46 AM    Prescriptions: ACTOS 15 MG TABS (PIOGLITAZONE HCL) once daily  #56 x 2   Entered by:   Gardenia Phlegm CMA   Authorized by:   [name] MD   Signed by:   Gardenia Phlegm CMA on 04/02/2009   Method used:   Electronically to        [facility] [phone]* (retail)       [address]       [place], [place]  [address]       Ph: [phone]       Fax: [phone]   RxID:   [id number]

## 2010-04-13 NOTE — Letter (Signed)
Summary: Lipid Letter  [facility]   [place], [place] [address]   Phone: [phone]  Fax: [phone]    05/27/2009  Sherrey Suber [place], [place]  [address]  Dear Ms. Cisnero:  We have carefully reviewed your last lipid profile from 05/27/2009 and the results are noted below with a summary of recommendations for lipid management.    Cholesterol:       160     Goal: <200   HDL "good" Cholesterol:   54.10     Goal: >40   LDL "bad" Cholesterol:   79     Goal: <130   Triglycerides:       134.0     Goal: <150        TLC Diet (Therapeutic Lifestyle Change): Saturated Fats & Transfatty acids should be kept < 7% of total calories ***Reduce Saturated Fats Polyunstaurated Fat can be up to 10% of total calories Monounsaturated Fat Fat can be up to 20% of total calories Total Fat should be no greater than 25-35% of total calories Carbohydrates should be 50-60% of total calories Protein should be approximately 15% of total calories Fiber should be at least 20-30 grams a day ***Increased fiber may help lower LDL Total Cholesterol should be < 200mg /day Consider adding plant stanol/sterols to diet (example: Benacol spread) ***A higher intake of unsaturated fat may reduce Triglycerides and Increase HDL    Adjunctive Measures (may lower LIPIDS and reduce risk of Heart Attack) include: Aerobic Exercise (20-30 minutes 3-4 times a week) Limit Alcohol Consumption Weight Reduction Aspirin 75-81 mg a day by mouth (if not allergic or contraindicated) Dietary Fiber 20-30 grams a day by mouth     Current Medications: 1)    Lisinopril-hydrochlorothiazide 20-25 Mg Tabs (Lisinopril-hydrochlorothiazide) .... Take 1 tablet by mouth once a day 2)    Allopurinol 300 Mg Tabs (Allopurinol) .... 1/2 once daily 3)    Centrium Silver  4)    Prilosec Otc  5)    Klonopin 0.5 Mg Tabs (Clonazepam) .... One by mouth two times a day as needed for anxiety 6)    Bystolic 5  Mg Tabs (Nebivolol hcl) .... Once daily 7)    Landscape architect W/device Kit (Blood glucose monitoring suppl) .... Use bid 8)    Bayer Contour Test  Strp (Glucose blood) .... Use two times a day 9)    Actos 15 Mg Tabs (Pioglitazone hcl) .... Once daily  If you have any questions, please call. We appreciate being able to work with you.   Sincerely,    [facility] [name] MD

## 2010-04-13 NOTE — Letter (Signed)
Summary: Results Follow-up Letter  [facility]   [place], [place] [address]   Phone: [phone]  Fax: [phone]    05/27/2009  [address] [place], [place]  [address]  Dear Ms. KOZLOFF,   The following are the results of your recent test(s):  Test     Result     Kidney function   slightly worsened CBC       normal Liver       normal Thyroid     normal Blood sugars   normal Urine       normal   _________________________________________________________  Please call for an appointment as directed _________________________________________________________ _________________________________________________________ _________________________________________________________  Sincerely,  [name] MD [facility]

## 2010-04-13 NOTE — Assessment & Plan Note (Signed)
Summary: 4 mos f/u #/cd   Vital Signs:  Patient profile:   77 year old female Height:      66 inches Weight:      216 pounds BMI:     34.99 O2 Sat:      97 % on Room air Temp:     97.4 degrees F oral Pulse rate:   55 / minute Pulse rhythm:   regular BP sitting:   136 / 70  (left arm) Cuff size:   large  Vitals Entered By: Estell Harpin CMA (May 27, 2009 10:00 AM)  O2 Flow:  Room air CC: follow-up visit 50mos, Hypertension Management, Back Pain Is Patient Diabetic? Yes Did you bring your meter with you today? No Pain Assessment Patient in pain? no        Primary Care Provider:  [name] MD  CC:  follow-up visit 60mos, Hypertension Management, and Back Pain.  History of Present Illness: She returns for f/up and informs me that she has had LBP for many years and was told by her previous physician that she had bugling discs in her lower back and she has mild/persistent numbness in her left leg. She does not want to pursue any treatment of the LBP at this time b/c her husband is undergoing chemotherapy.  Back Pain History:      The patient's back pain started approximately 05/23/2006.  The pain is located in the lower back region and does radiate below the knees.  She states this is not work related.  She states that she has had a prior history of back pain.  The patient has not had any recent physical therapy for her back pain.  The following makes the back pain better: rest.  The following makes the back pain worse: bending.    Critical Exclusionary Diagnosis Criteria (CEDC) for Back Pain:      The patient denies a history of previous trauma.  She has no prior history of spinal surgery.  There are no symptoms to suggest infection, cauda equina, or psychosocial factors for back pain.  Cancer risk factors include age >50 yrs with new back pain and no improvement in low back pain after 4-6 weeks therapy.    Hypertension History:      She denies headache, chest pain,  palpitations, dyspnea with exertion, orthopnea, PND, peripheral edema, visual symptoms, neurologic problems, syncope, and side effects from treatment.  She notes no problems with any antihypertensive medication side effects.        Positive major cardiovascular risk factors include female age 72 years old or older, diabetes, and hypertension.  Negative major cardiovascular risk factors include no history of hyperlipidemia, negative family history for ischemic heart disease, and non-tobacco-user status.        Positive history for target organ damage include renal insufficiency.  Further assessment for target organ damage reveals no history of ASHD, cardiac end-organ damage (CHF/LVH), stroke/TIA, peripheral vascular disease, or hypertensive retinopathy.      Current Medications (verified): 1)  Lisinopril-Hydrochlorothiazide 20-25 Mg Tabs (Lisinopril-Hydrochlorothiazide) .... Take 1 Tablet By Mouth Once A Day 2)  Allopurinol 300 Mg Tabs (Allopurinol) .... 1/2 Once Daily 3)  Centrium Silver 4)  Prilosec Otc 5)  Klonopin 0.5 Mg Tabs (Clonazepam) .... One By Mouth Two Times A Day As Needed For Anxiety 6)  Bystolic 5 Mg Tabs (Nebivolol Hcl) .... Once Daily 7)  Bayer Contour Monitor W/device Kit (Blood Glucose Monitoring Suppl) .... Use Bid 8)  Bayer Contour  Test  Strp (Glucose Blood) .... Use Two Times A Day 9)  Actos 15 Mg Tabs (Pioglitazone Hcl) .... Once Daily  Allergies (verified): 1)  ! Metformin Hcl  Past History:  Past Medical History: Reviewed history from 08/25/2008 and no changes required. Diabetes mellitus, type II Gout Hypertension Low back pain- foraminal narrowing at L4-5 and disc bulge at L5-S1 on MRI 08/2006 Renal insufficiency Urinary incontinence  Past Surgical History: Reviewed history from 08/25/2008 and no changes required. Denies surgical history  Family History: Reviewed history from 09/24/2008 and no changes required. Family History Diabetes 1st degree  relative Family History Hypertension  Social History: Reviewed history from 09/24/2008 and no changes required. Retired Never Smoked Alcohol use-no Drug use-no Regular exercise-no  Review of Systems       The patient complains of weight gain.  The patient denies anorexia, fever, weight loss, chest pain, syncope, dyspnea on exertion, peripheral edema, prolonged cough, abdominal pain, hematuria, difficulty walking, depression, and angioedema.   Psych:  Denies alternate hallucination ( auditory/visual), anxiety, depression, easily angered, easily tearful, irritability, mental problems, panic attacks, sense of great danger, suicidal thoughts/plans, and thoughts of violence. Endo:  Denies cold intolerance, excessive hunger, excessive thirst, excessive urination, heat intolerance, and polyuria.  Physical Exam  General:  alert, well-developed, well-nourished, well-hydrated, cooperative to examination, good hygiene, and overweight-appearing.   Mouth:  Oral mucosa and oropharynx without lesions or exudates.  Teeth in good repair. Neck:  supple, full ROM, no masses, no carotid bruits, no cervical lymphadenopathy, and no neck tenderness.   Lungs:  normal respiratory effort, no intercostal retractions, no accessory muscle use, normal breath sounds, and no dullness.   Heart:  Normal rate and regular rhythm. S1 and S2 normal without gallop, murmur, click, rub or other extra sounds. Abdomen:  Bowel sounds positive,abdomen soft and non-tender without masses, organomegaly or hernias noted. Msk:  normal ROM, no joint tenderness, no joint swelling, and no joint warmth.   Pulses:  R and L carotid,radial,femoral,dorsalis pedis and posterior tibial pulses are full and equal bilaterally Extremities:  trace left pedal edema and trace right pedal edema.   Neurologic:  alert & oriented X3, cranial nerves II-XII intact, strength normal in all extremities, sensation intact to light touch, sensation intact to  pinprick, gait normal, and DTRs symmetrical and normal.   Skin:  Intact without suspicious lesions or rashes Cervical Nodes:  No lymphadenopathy noted Axillary Nodes:  No palpable lymphadenopathy Psych:  Cognition and judgment appear intact. Alert and cooperative with normal attention span and concentration. No apparent delusions, illusions, hallucinations  Low Back Pain Physical Exam:    Inspection-deformity:     No    Palpation-spinal tenderness:   No    Motor Exam/Strength:         Left Ankle Dorsiflexion (L5,L4):     normal       Left Great Toe Dorsiflexion (L5,L4):     normal       Left Heel Walk (L5,some L4):     normal       Left Single Squat & Rise-Quads (L4):   normal       Left Toe Walk-calf (S1):       normal       Right Ankle Dorsiflexion (L5,L4):     normal       Right Great Toe Dorsiflexion (L5,L4):       normal       Right Heel Walk (L5,some L4):     normal  Right Single Squat & Rise Quads (L4):   normal       Right Toe Walk-calf (S1):       normal  Diabetes Management Exam:    Foot Exam (with socks and/or shoes not present):       Sensory-Pinprick/Light touch:          Left medial foot (L-4): normal          Left dorsal foot (L-5): normal          Left lateral foot (S-1): normal          Right medial foot (L-4): normal          Right dorsal foot (L-5): normal          Right lateral foot (S-1): normal       Sensory-Monofilament:          Left foot: normal          Right foot: normal       Inspection:          Left foot: normal          Right foot: normal       Nails:          Left foot: normal          Right foot: normal   Impression & Recommendations:  Problem # 1:  LOW BACK PAIN (ICD-724.2) Assessment Unchanged  No further dx. or tx. at her request.  Discussed use of moist heat or ice, modified activities, medications, and stretching/strengthening exercises. Back care instructions given. To be seen in 2 weeks if no improvement; sooner if worsening  of symptoms.   Problem # 2:  HYPERTENSION (ICD-401.9) Assessment: Improved  Her updated medication list for this problem includes:    Lisinopril-hydrochlorothiazide 20-25 Mg Tabs (Lisinopril-hydrochlorothiazide) .Marland Kitchen... Take 1 tablet by mouth once a day    Bystolic 5 Mg Tabs (Nebivolol hcl) ..... Once daily  Orders: Prescription Created Electronically [phone]) Venipuncture [id number]) TLB-Lipid Panel (80061-LIPID) TLB-BMP (Basic Metabolic Panel-BMET) (99991111) TLB-CBC Platelet - w/Differential (85025-CBCD) TLB-Hepatic/Liver Function Pnl (80076-HEPATIC) TLB-TSH (Thyroid Stimulating Hormone) (84443-TSH) TLB-A1C / Hgb A1C (Glycohemoglobin) (83036-A1C) TLB-Udip w/ Micro (81001-URINE) TLB-Uric Acid, Blood (84550-URIC)  BP today: 136/70 Prior BP: 132/70 (01/22/2009)  10 Yr Risk Heart Disease: 17 % Prior 10 Yr Risk Heart Disease: 15 % (10/22/2008)  Labs Reviewed: K+: 3.9 (01/22/2009) Creat: : 1.6 (01/22/2009)   Chol: 158 (01/22/2009)   HDL: 39.90 (01/22/2009)   LDL: 92 (01/22/2009)   TG: 132.0 (01/22/2009)  Problem # 3:  DIABETES MELLITUS, TYPE II (ICD-250.00) Assessment: Unchanged  Her updated medication list for this problem includes:    Lisinopril-hydrochlorothiazide 20-25 Mg Tabs (Lisinopril-hydrochlorothiazide) .Marland Kitchen... Take 1 tablet by mouth once a day    Actos 15 Mg Tabs (Pioglitazone hcl) ..... Once daily  Orders: Prescription Created Electronically [phone]) Venipuncture ([phone]) TLB-Lipid Panel (80061-LIPID) TLB-BMP (Basic Metabolic Panel-BMET) (99991111) TLB-CBC Platelet - w/Differential (85025-CBCD) TLB-Hepatic/Liver Function Pnl (80076-HEPATIC) TLB-TSH (Thyroid Stimulating Hormone) (84443-TSH) TLB-A1C / Hgb A1C (Glycohemoglobin) (83036-A1C) TLB-Udip w/ Micro (81001-URINE) TLB-Uric Acid, Blood (84550-URIC)  Labs Reviewed: Creat: 1.6 (01/22/2009)     Last Eye Exam: normal (02/18/2009) Reviewed HgBA1c results: 5.9 (01/22/2009)  6.0 (08/25/2008)  Problem # 4:   ADJUSTMENT DISORDER WITH ANXIETY (ICD-309.24) Assessment: Improved  Complete Medication List: 1)  Lisinopril-hydrochlorothiazide 20-25 Mg Tabs (Lisinopril-hydrochlorothiazide) .... Take 1 tablet by mouth once a day 2)  Allopurinol 300 Mg Tabs (Allopurinol) .... 1/2 once daily 3)  Centrium Silver  4)  Prilosec Otc  5)  Klonopin 0.5 Mg Tabs (Clonazepam) .... One by mouth two times a day as needed for anxiety 6)  Bystolic 5 Mg Tabs (Nebivolol hcl) .... Once daily 7)  Landscape architect W/device Kit (Blood glucose monitoring suppl) .... Use bid 8)  Bayer Contour Test Strp (Glucose blood) .... Use two times a day 9)  Actos 15 Mg Tabs (Pioglitazone hcl) .... Once daily  Hypertension Assessment/Plan:      The patient's hypertensive risk group is category C: Target organ damage and/or diabetes.  Her calculated 10 year risk of coronary heart disease is 17 %.  Today's blood pressure is 136/70.  Her blood pressure goal is < 130/80.  Patient Instructions: 1)  Please schedule a follow-up appointment in 4 months. 2)  It is important that you exercise regularly at least 20 minutes 5 times a week. If you develop chest pain, have severe difficulty breathing, or feel very tired , stop exercising immediately and seek medical attention. 3)  You need to lose weight. Consider a lower calorie diet and regular exercise.  4)  Check your blood sugars regularly. If your readings are usually above 200  or below 70 you should contact our office. 5)  It is important that your Diabetic A1c level is checked every 3 months. 6)  See your eye doctor yearly to check for diabetic eye damage. 7)  Check your feet each night for sore areas, calluses or signs of infection. 8)  Check your Blood Pressure regularly. If it is above 130/80: you should make an appointment. Prescriptions: ACTOS 15 MG TABS (PIOGLITAZONE HCL) once daily  #30 x 11   Entered and Authorized by:   [name] MD   Signed by:   [name] MD on  05/27/2009   Method used:   Electronically to        [facility] [phone]* (retail)       [address]       [place], [place]  [address]       Ph: [phone]       Fax: [phone]   RxID:   [id number] ALLOPURINOL 300 MG TABS (ALLOPURINOL) 1/2 once daily  #30 Tablet x 11   Entered and Authorized by:   [name] MD   Signed by:   [name] MD on 05/27/2009   Method used:   Electronically to        [facility] [phone]* (retail)       [address]       [place], [place]  [address]       Ph: [phone]       Fax: [phone]   RxID:   [id number] LISINOPRIL-HYDROCHLOROTHIAZIDE 20-25 MG TABS (LISINOPRIL-HYDROCHLOROTHIAZIDE) Take 1 tablet by mouth once a day  #30 x 11   Entered and Authorized by:   [name] MD   Signed by:   [name] MD on 05/27/2009   Method used:   Electronically to        [facility] [phone]* (retail)       [address]       [place], [place]  [address]       Ph: [phone]       Fax: [phone]   RxID:  [id number]    Not Administered:    Influenza Vaccine not given due to: declined

## 2010-04-15 NOTE — Assessment & Plan Note (Signed)
Summary: 3 MO ROV /NWS  #   Vital Signs:  Patient profile:   77 year old female Menstrual status:  postmenopausal Height:      66 inches Weight:      213.50 pounds O2 Sat:      96 % on Room air Temp:     98.5 degrees F oral Pulse rate:   52 / minute Pulse rhythm:   regular Resp:     16 per minute BP sitting:   146 / 70  (left arm) Cuff size:   large  Vitals Entered By: [place] (April 02, 2010 9:36 AM)  O2 Flow:  Room air CC: follow-up visit Is Patient Diabetic? Yes Did you bring your meter with you today? No Pain Assessment Patient in pain? no        Primary Care Provider:  [name] MD  CC:  follow-up visit.  History of Present Illness:  Follow-Up Visit      This is a 77 year old woman who presents for Follow-up visit.  The patient denies chest pain, palpitations, dizziness, syncope, low blood sugar symptoms, high blood sugar symptoms, edema, SOB, DOE, PND, and orthopnea.  Since the last visit the patient notes no new problems or concerns.  The patient reports taking meds as prescribed, monitoring BP, monitoring blood sugars, and dietary compliance.  When questioned about possible medication side effects, the patient notes none.    Preventive Screening-Counseling & Management  Alcohol-Tobacco     Alcohol drinks/day: 0     Alcohol Counseling: not indicated; patient does not drink     Smoking Status: never     Tobacco Counseling: not indicated; no tobacco use  Hep-HIV-STD-Contraception     Hepatitis Risk: no risk noted     HIV Risk: no risk noted     STD Risk: no risk noted      Drug Use:  no.    Clinical Review Panels:  Prevention   Last Mammogram:  Normal Bilateral (08/13/2009)   Last Pap Smear:  Normal (10/03/2007)   Last Colonoscopy:  Normal (02/24/2003)  Immunizations   Last Tetanus Booster:  Tdap (08/25/2008)   Last Pneumovax:  Pneumovax (08/25/2008)  Lipid Management   Cholesterol:  163 (12/30/2009)   LDL (bad choesterol):   92 (12/30/2009)   HDL (good cholesterol):  43.50 (12/30/2009)  Diabetes Management   HgBA1C:  6.0 (12/30/2009)   Creatinine:  2.0 (12/30/2009)   Last Dilated Eye Exam:  normal (02/18/2009)   Last Foot Exam:  yes (04/02/2010)   Last Pneumovax:  Pneumovax (08/25/2008)  CBC   WBC:  5.4 (12/30/2009)   RBC:  3.92 (12/30/2009)   Hgb:  12.8 (12/30/2009)   Hct:  38.5 (12/30/2009)   Platelets:  200.0 (12/30/2009)   MCV  98.2 (12/30/2009)   MCHC  33.4 (12/30/2009)   RDW  14.0 (12/30/2009)   PMN:  51.8 (12/30/2009)   Lymphs:  34.8 (12/30/2009)   Monos:  6.7 (12/30/2009)   Eosinophils:  5.7 (12/30/2009)   Basophil:  1.0 (12/30/2009)  Complete Metabolic Panel   Glucose:  106 (12/30/2009)   Sodium:  148 (12/30/2009)   Potassium:  4.7 (12/30/2009)   Chloride:  110 (12/30/2009)   CO2:  31 (12/30/2009)   BUN:  32 (12/30/2009)   Creatinine:  2.0 (12/30/2009)   Albumin:  3.6 (12/30/2009)   Total Protein:  6.4 (12/30/2009)   Calcium:  10.1 (12/30/2009)   Total Bili:  0.7 (12/30/2009)   Alk Phos:  72 (  12/30/2009)   SGPT (ALT):  16 (12/30/2009)   SGOT (AST):  23 (12/30/2009)   Medications Prior to Update: 1)  Lisinopril-Hydrochlorothiazide 20-25 Mg Tabs (Lisinopril-Hydrochlorothiazide) .... Take 1 Tablet By Mouth Once A Day 2)  Allopurinol 300 Mg Tabs (Allopurinol) .... 1/2 Once Daily 3)  Centrium Silver 4)  Prilosec Otc 5)  Klonopin 0.5 Mg Tabs (Clonazepam) .... One By Mouth Two Times A Day As Needed For Anxiety 6)  Bystolic 5 Mg Tabs (Nebivolol Hcl) .... Once Daily 7)  Bayer Contour Monitor W/device Kit (Blood Glucose Monitoring Suppl) .... Use Bid 8)  Bayer Contour Test  Strp (Glucose Blood) .... Use Two Times A Day 9)  Tramadol Hcl 50 Mg Tabs (Tramadol Hcl) .Marland Kitchen.. 1-2 By Mouth Qid As Needed For Low Back Pain 10)  Januvia 100 Mg Tabs (Sitagliptin Phosphate) .... One By Mouth Once Daily For Diabetes 11)  Crestor 10 Mg Tabs (Rosuvastatin Calcium) .... One By Mouth Once Daily For  Cholesterol  Current Medications (verified): 1)  Lisinopril-Hydrochlorothiazide 20-25 Mg Tabs (Lisinopril-Hydrochlorothiazide) .... Take 1 Tablet By Mouth Once A Day 2)  Allopurinol 300 Mg Tabs (Allopurinol) .... 1/2 Once Daily 3)  Centrium Silver 4)  Prilosec Otc 5)  Klonopin 0.5 Mg Tabs (Clonazepam) .... One By Mouth Two Times A Day As Needed For Anxiety 6)  Bystolic 5 Mg Tabs (Nebivolol Hcl) .... Once Daily 7)  Bayer Contour Monitor W/device Kit (Blood Glucose Monitoring Suppl) .... Use Bid 8)  Bayer Contour Test  Strp (Glucose Blood) .... Use Two Times A Day 9)  Tramadol Hcl 50 Mg Tabs (Tramadol Hcl) .Marland Kitchen.. 1-2 By Mouth Qid As Needed For Low Back Pain 10)  Januvia 100 Mg Tabs (Sitagliptin Phosphate) .... One By Mouth Once Daily For Diabetes 11)  Crestor 10 Mg Tabs (Rosuvastatin Calcium) .... One By Mouth Once Daily For Cholesterol  Allergies (verified): 1)  ! Metformin Hcl  Past History:  Past Medical History: Last updated: 12/30/2009 Diabetes mellitus, type II Gout Hypertension Low back pain- foraminal narrowing at L4-5 and disc bulge at L5-S1 on MRI 08/2006 Renal insufficiency Urinary incontinence Hyperlipidemia  Past Surgical History: Last updated: 08/25/2008 Denies surgical history  Family History: Last updated: 09/24/2008 Family History Diabetes 1st degree relative Family History Hypertension  Social History: Last updated: 09/24/2008 Retired Never Smoked Alcohol use-no Drug use-no Regular exercise-no  Risk Factors: Alcohol Use: 0 (04/02/2010) Exercise: no (09/24/2008)  Risk Factors: Smoking Status: never (04/02/2010)  Family History: Reviewed history from 09/24/2008 and no changes required. Family History Diabetes 1st degree relative Family History Hypertension  Social History: Reviewed history from 09/24/2008 and no changes required. Retired Never Smoked Alcohol use-no Drug use-no Regular exercise-no  Review of Systems       The patient  complains of weight gain.  The patient denies anorexia, fever, weight loss, chest pain, syncope, dyspnea on exertion, peripheral edema, prolonged cough, headaches, hemoptysis, abdominal pain, hematuria, suspicious skin lesions, transient blindness, difficulty walking, depression, unusual weight change, enlarged lymph nodes, and angioedema.   MS:  Denies joint pain, joint redness, joint swelling, loss of strength, low back pain, mid back pain, muscle aches, and stiffness. Endo:  Denies cold intolerance, excessive hunger, excessive thirst, excessive urination, heat intolerance, polyuria, and weight change.  Physical Exam  General:  alert, well-developed, well-nourished, well-hydrated, cooperative to examination, good hygiene, and overweight-appearing.   Head:  normocephalic and atraumatic.   Mouth:  Oral mucosa and oropharynx without lesions or exudates.  Teeth in good repair. Neck:  supple,  full ROM, no masses, no carotid bruits, no cervical lymphadenopathy, and no neck tenderness.   Lungs:  normal respiratory effort, no intercostal retractions, no accessory muscle use, normal breath sounds, and no dullness.   Heart:  Normal rate and regular rhythm. S1 and S2 normal without gallop, murmur, click, rub or other extra sounds. Abdomen:  Bowel sounds positive,abdomen soft and non-tender without masses, organomegaly or hernias noted. Msk:  normal ROM, no joint tenderness, no joint swelling, and no joint warmth.   Pulses:  R and L carotid,radial,femoral,dorsalis pedis and posterior tibial pulses are full and equal bilaterally Extremities:  trace left pedal edema and trace right pedal edema.   Neurologic:  alert & oriented X3, cranial nerves II-XII intact, strength normal in all extremities, sensation intact to light touch, sensation intact to pinprick, gait normal, and DTRs symmetrical and normal.   Skin:  Intact without suspicious lesions or rashes Cervical Nodes:  no anterior cervical adenopathy and no  posterior cervical adenopathy.   Axillary Nodes:  no R axillary adenopathy and no L axillary adenopathy.   Psych:  Cognition and judgment appear intact. Alert and cooperative with normal attention span and concentration. No apparent delusions, illusions, hallucinations  Diabetes Management Exam:    Foot Exam (with socks and/or shoes not present):       Sensory-Pinprick/Light touch:          Left medial foot (L-4): normal          Left dorsal foot (L-5): normal          Left lateral foot (S-1): normal          Right medial foot (L-4): normal          Right dorsal foot (L-5): normal          Right lateral foot (S-1): normal       Sensory-Monofilament:          Left foot: normal          Right foot: normal       Inspection:          Left foot: normal          Right foot: normal       Nails:          Left foot: normal          Right foot: normal   Impression & Recommendations:  Problem # 1:  RENAL INSUFFICIENCY (ICD-588.9) Assessment Unchanged  Orders: Venipuncture [id number]) TLB-BMP (Basic Metabolic Panel-BMET) (99991111) TLB-Hepatic/Liver Function Pnl (80076-HEPATIC) TLB-A1C / Hgb A1C (Glycohemoglobin) (83036-A1C) TLB-Uric Acid, Blood (84550-URIC)  Problem # 2:  HYPERTENSION (ICD-401.9) Assessment: Unchanged  Her updated medication list for this problem includes:    Lisinopril-hydrochlorothiazide 20-25 Mg Tabs (Lisinopril-hydrochlorothiazide) .Marland Kitchen... Take 1 tablet by mouth once a day    Bystolic 5 Mg Tabs (Nebivolol hcl) ..... Once daily  Orders: Venipuncture [id number]) TLB-BMP (Basic Metabolic Panel-BMET) (99991111) TLB-Hepatic/Liver Function Pnl (80076-HEPATIC) TLB-A1C / Hgb A1C (Glycohemoglobin) (83036-A1C) TLB-Uric Acid, Blood (84550-URIC)  BP today: 146/70 Prior BP: 128/70 (12/30/2009)  Prior 10 Yr Risk Heart Disease: 17 % (05/27/2009)  Labs Reviewed: K+: 4.7 (12/30/2009) Creat: : 2.0 (12/30/2009)   Chol: 163 (12/30/2009)   HDL: 43.50 (12/30/2009)   LDL:  92 (12/30/2009)   TG: 140.0 (12/30/2009)  Problem # 3:  GOUT (ICD-274.9) Assessment: Improved  Her updated medication list for this problem includes:    Allopurinol 300 Mg Tabs (Allopurinol) .Marland Kitchen... 1/2 once daily  Orders: Venipuncture [id number]) TLB-BMP (Basic Metabolic  Panel-BMET) (80048-METABOL) TLB-Hepatic/Liver Function Pnl (80076-HEPATIC) TLB-A1C / Hgb A1C (Glycohemoglobin) (83036-A1C) TLB-Uric Acid, Blood (84550-URIC)  Elevate extremity; warm compresses, symptomatic relief and medication as directed.   Problem # 4:  DIABETES MELLITUS, TYPE II (ICD-250.00) Assessment: Improved  Her updated medication list for this problem includes:    Lisinopril-hydrochlorothiazide 20-25 Mg Tabs (Lisinopril-hydrochlorothiazide) .Marland Kitchen... Take 1 tablet by mouth once a day    Januvia 100 Mg Tabs (Sitagliptin phosphate) ..... One by mouth once daily for diabetes  Orders: Venipuncture [id number]) TLB-BMP (Basic Metabolic Panel-BMET) (99991111) TLB-Hepatic/Liver Function Pnl (80076-HEPATIC) TLB-A1C / Hgb A1C (Glycohemoglobin) (83036-A1C) TLB-Uric Acid, Blood (84550-URIC)  Labs Reviewed: Creat: 2.0 (12/30/2009)     Last Eye Exam: normal (02/18/2009) Reviewed HgBA1c results: 6.0 (12/30/2009)  5.8 (09/29/2009)  Complete Medication List: 1)  Lisinopril-hydrochlorothiazide 20-25 Mg Tabs (Lisinopril-hydrochlorothiazide) .... Take 1 tablet by mouth once a day 2)  Allopurinol 300 Mg Tabs (Allopurinol) .... 1/2 once daily 3)  Centrium Silver  4)  Prilosec Otc  5)  Klonopin 0.5 Mg Tabs (Clonazepam) .... One by mouth two times a day as needed for anxiety 6)  Bystolic 5 Mg Tabs (Nebivolol hcl) .... Once daily 7)  Landscape architect W/device Kit (Blood glucose monitoring suppl) .... Use bid 8)  Bayer Contour Test Strp (Glucose blood) .... Use two times a day 9)  Tramadol Hcl 50 Mg Tabs (Tramadol hcl) .Marland Kitchen.. 1-2 by mouth qid as needed for low back pain 10)  Januvia 100 Mg Tabs (Sitagliptin phosphate) ....  One by mouth once daily for diabetes 11)  Crestor 10 Mg Tabs (Rosuvastatin calcium) .... One by mouth once daily for cholesterol  Patient Instructions: 1)  Please schedule a follow-up appointment in 4 months. 2)  It is important that you exercise regularly at least 20 minutes 5 times a week. If you develop chest pain, have severe difficulty breathing, or feel very tired , stop exercising immediately and seek medical attention. 3)  You need to lose weight. Consider a lower calorie diet and regular exercise.  4)  Check your blood sugars regularly. If your readings are usually above 200 or below 70 you should contact our office. 5)  It is important that your Diabetic A1c level is checked every 3 months. 6)  See your eye doctor yearly to check for diabetic eye damage. 7)  Check your feet each night for sore areas, calluses or signs of infection. 8)  Check your Blood Pressure regularly. If it is above 130/80: you should make an appointment. Prescriptions: JANUVIA 100 MG TABS (SITAGLIPTIN PHOSPHATE) One by mouth once daily for diabetes  #140 x 0   Entered and Authorized by:   [name] MD   Signed by:   [name] MD on 04/02/2010   Method used:   Samples Given   RxID:   [id number] BYSTOLIC 5 MG TABS (NEBIVOLOL HCL) once daily  #168 x 0   Entered and Authorized by:   [name] MD   Signed by:   [name] MD on 04/02/2010   Method used:   Samples Given   RxID:   [id number]    Orders Added: 1)  Venipuncture [id number] 2)  TLB-BMP (Basic Metabolic Panel-BMET) 123456 3)  TLB-Hepatic/Liver Function Pnl [80076-HEPATIC] 4)  TLB-A1C / Hgb A1C (Glycohemoglobin) [83036-A1C] 5)  TLB-Uric Acid, Blood [84550-URIC] 6)  Est. Patient Level IV [id number]

## 2010-06-01 ENCOUNTER — Ambulatory Visit: Payer: Self-pay | Admitting: Physical Medicine & Rehabilitation

## 2010-06-03 ENCOUNTER — Other Ambulatory Visit: Payer: Self-pay | Admitting: Internal Medicine

## 2010-06-07 ENCOUNTER — Ambulatory Visit (HOSPITAL_BASED_OUTPATIENT_CLINIC_OR_DEPARTMENT_OTHER): Payer: MEDICARE | Admitting: Physical Medicine & Rehabilitation

## 2010-06-07 ENCOUNTER — Encounter: Payer: MEDICARE | Attending: Physical Medicine & Rehabilitation

## 2010-06-07 DIAGNOSIS — M47817 Spondylosis without myelopathy or radiculopathy, lumbosacral region: Secondary | ICD-10-CM

## 2010-06-07 DIAGNOSIS — M545 Low back pain, unspecified: Secondary | ICD-10-CM | POA: Insufficient documentation

## 2010-06-07 DIAGNOSIS — R209 Unspecified disturbances of skin sensation: Secondary | ICD-10-CM | POA: Insufficient documentation

## 2010-07-05 ENCOUNTER — Other Ambulatory Visit: Payer: Self-pay | Admitting: Internal Medicine

## 2010-07-08 ENCOUNTER — Other Ambulatory Visit: Payer: Self-pay | Admitting: Ophthalmology

## 2010-07-08 ENCOUNTER — Encounter (HOSPITAL_COMMUNITY): Payer: Medicare Other

## 2010-07-08 LAB — HEMOGLOBIN AND HEMATOCRIT, BLOOD
HCT: 41.4 % (ref 36.0–46.0)
Hemoglobin: 13.1 g/dL (ref 12.0–15.0)

## 2010-07-08 LAB — BASIC METABOLIC PANEL
Chloride: 102 mEq/L (ref 96–112)
Creatinine, Ser: 2.11 mg/dL — ABNORMAL HIGH (ref 0.4–1.2)
GFR calc Af Amer: 27 mL/min — ABNORMAL LOW (ref >60)
Potassium: 4.1 mEq/L (ref 3.5–5.1)
Sodium: 140 mEq/L (ref 135–145)

## 2010-07-12 ENCOUNTER — Encounter (HOSPITAL_BASED_OUTPATIENT_CLINIC_OR_DEPARTMENT_OTHER): Payer: Medicare Other | Admitting: Physical Medicine & Rehabilitation

## 2010-07-12 ENCOUNTER — Encounter: Payer: Medicare Other | Attending: Physical Medicine & Rehabilitation

## 2010-07-12 DIAGNOSIS — M545 Low back pain, unspecified: Secondary | ICD-10-CM | POA: Insufficient documentation

## 2010-07-12 DIAGNOSIS — M47817 Spondylosis without myelopathy or radiculopathy, lumbosacral region: Secondary | ICD-10-CM | POA: Insufficient documentation

## 2010-07-12 DIAGNOSIS — R209 Unspecified disturbances of skin sensation: Secondary | ICD-10-CM | POA: Insufficient documentation

## 2010-07-13 NOTE — Procedures (Signed)
Danielle Frazier, Danielle Frazier                 ACCOUNT NO.:  [id number]  MEDICAL RECORD NO.:  [id number]           PATIENT TYPE:  O  LOCATION:  [facility]                          FACILITY:  [facility]  PHYSICIAN:  Charlett Blake, M.D.DATE OF BIRTH:  21-Jul-1930  DATE OF PROCEDURE: DATE OF DISCHARGE:                              OPERATIVE REPORT  PROCEDURE:  Left L5 dorsal ramus injection, left L4 medial branch block, left L3 medial branch block under fluoroscopic guidance.  INDICATION:  Left-sided lumbar pain due to lumbosacral spondylosis.  She has had 6 months relief with medial branch blocks with recurrence approximately 1 month ago.  Informed consent was obtained after describing risks and benefits of the procedure with the patient.  These include bleeding, bruising, and infection.  She elects to proceed and has given written consent.  The patient placed prone on fluoroscopy table.  Betadine prep, sterile drape, a 25-gauge inch and half needle was used to anesthetize skin and subcu tissue with 1% lidocaine x2 mL at each of three sites.  Then a 22 gauge 3-1/2-inch spinal needle was inserted under fluoroscopic guidance first charting left S1 SAP sacral junction, bone contact made. Omnipaque 180 x 0.5 mL demonstrated no intravascular uptake, 10.5 mL of solution 1 mL of 4 mg/mL dexamethasone, 2% MPF lidocaine.  Then the left L5 SAP transverse process junction, targeted bone contact made, confirmed with lateral imaging.  Omnipaque 180 x 0.5 mL demonstrated no intravascular vascular uptake.  Then 0.5 mL of the dexamethasone lidocaine solution was injected.  Then the left L4 SAP transverse process junction, targeted bone contact made.  Omnipaque 180 x 0.5 mL demonstrated no intravascular uptake.  Then 0.5 mL of dexamethasone lidocaine solution was injected.  The patient tolerated procedure well. Postprocedure instructions given.     Charlett Blake, M.D. Electronically Signed    AEK/MEDQ   D:  07/12/2010 13:32:05  T:  07/13/2010 00:44:41  Job:  [id number]  cc:   [name], MD [place] [address]

## 2010-07-15 ENCOUNTER — Ambulatory Visit (HOSPITAL_COMMUNITY)
Admission: RE | Admit: 2010-07-15 | Discharge: 2010-07-15 | Disposition: A | Discharge status: Home / Self Care | Payer: Medicare Other | Source: Ambulatory Visit | Attending: Ophthalmology | Admitting: Ophthalmology

## 2010-07-15 DIAGNOSIS — Z79899 Other long term (current) drug therapy: Secondary | ICD-10-CM | POA: Insufficient documentation

## 2010-07-15 DIAGNOSIS — H2589 Other age-related cataract: Secondary | ICD-10-CM | POA: Insufficient documentation

## 2010-07-15 DIAGNOSIS — I10 Essential (primary) hypertension: Secondary | ICD-10-CM | POA: Insufficient documentation

## 2010-07-15 LAB — GLUCOSE, CAPILLARY: Glucose-Capillary: 95 mg/dL (ref 70–99)

## 2010-07-29 ENCOUNTER — Encounter: Payer: Self-pay | Admitting: Internal Medicine

## 2010-08-02 ENCOUNTER — Ambulatory Visit (INDEPENDENT_AMBULATORY_CARE_PROVIDER_SITE_OTHER): Payer: Medicare Other | Admitting: Internal Medicine

## 2010-08-02 ENCOUNTER — Encounter: Payer: Self-pay | Admitting: Internal Medicine

## 2010-08-02 VITALS — BP 140/76 | HR 56 | Temp 97.6°F | Resp 16 | Wt 211.8 lb

## 2010-08-02 DIAGNOSIS — I10 Essential (primary) hypertension: Secondary | ICD-10-CM

## 2010-08-02 DIAGNOSIS — E785 Hyperlipidemia, unspecified: Secondary | ICD-10-CM

## 2010-08-02 DIAGNOSIS — Z1231 Encounter for screening mammogram for malignant neoplasm of breast: Secondary | ICD-10-CM

## 2010-08-02 DIAGNOSIS — E119 Type 2 diabetes mellitus without complications: Secondary | ICD-10-CM

## 2010-08-02 DIAGNOSIS — N259 Disorder resulting from impaired renal tubular function, unspecified: Secondary | ICD-10-CM

## 2010-08-02 NOTE — Assessment & Plan Note (Signed)
Her renal function is stable 

## 2010-08-02 NOTE — Assessment & Plan Note (Signed)
Her blood pressure is well controlled. 

## 2010-08-02 NOTE — Assessment & Plan Note (Signed)
Her blood sugars are well controlled 

## 2010-08-02 NOTE — Patient Instructions (Signed)
Diabetes, Type 2 Diabetes is a lasting (chronic) disease. In type 2 diabetes, the pancreas does not make enough insulin (a hormone), and the body does not respond normally to the insulin that is made. This type of diabetes was also previously called adult onset diabetes. About 90% of all those who have diabetes have type 2. It usually occurs after the age of 40 but can occur at any age. CAUSES Unlike type 1 diabetes, which happens because insulin is no longer being made, type 2 diabetes happens because the body is making less insulin and has trouble using the insulin properly. SYMPTOMS  Drinking more than usual.   Urinating more than usual.   Blurred vision.   Dry, itchy skin.   Frequent infection like yeast infections in women.   More tired than usual (fatigue).  TREATMENT  Healthy eating.   Exercise.   Medication, if needed.   Monitoring blood glucose (sugar).   Seeing your caregiver regularly.  HOME CARE INSTRUCTIONS  Check your blood glucose (sugar) at least once daily. More frequent monitoring may be necessary, depending on your medications and on how well your diabetes is controlled. Your caregiver will advise you.   Take your medicine as directed by your caregiver.   Do not smoke.   Make wise food choices. Ask your caregiver for information. Weight loss can improve your diabetes.   Learn about low blood glucose (hypoglycemia) and how to treat it.   Get your eyes checked regularly.   Have a yearly physical exam. Have your blood pressure checked. Get your blood and urine tested.   Wear a pendant or bracelet saying that you have diabetes.   Check your feet every night for sores. Let your caregiver know if you have sores that are not healing.  SEEK MEDICAL CARE IF:  You are having problems keeping your blood glucose at target range.   You feel you might be having problems with your medicines.   You have symptoms of an illness that is not improving after 24  hours.   You have a sore or wound that is not healing.   You notice a change in vision or a new problem with your vision.   You develop a fever of more than 100.5.  Document Released: 02/28/2005 Document Re-Released: 03/22/2009 [employer] Patient Information 2011 [employer]. 

## 2010-08-02 NOTE — Assessment & Plan Note (Signed)
She is doing well on crestor 

## 2010-08-02 NOTE — Progress Notes (Signed)
Subjective:    Patient ID: Danielle Frazier, female    DOB: 26-Sep-1930, 77 y.o.   MRN: [id number]  Diabetes She presents for her follow-up diabetic visit. She has type 2 diabetes mellitus. Her disease course has been improving. There are no hypoglycemic associated symptoms. Pertinent negatives for hypoglycemia include no dizziness, headaches, pallor, seizures, speech difficulty or tremors. Pertinent negatives for diabetes include no blurred vision, no chest pain, no fatigue, no foot paresthesias, no foot ulcerations, no polydipsia, no polyphagia, no polyuria, no visual change, no weakness and no weight loss. There are no hypoglycemic complications. Symptoms are resolved. There are no diabetic complications. Current diabetic treatment includes oral agent (monotherapy). She is compliant with treatment all of the time. Her weight is stable. She is following a generally healthy diet. Meal planning includes avoidance of concentrated sweets. She has had a previous visit with a dietician. She participates in exercise intermittently. Her home blood glucose trend is decreasing steadily. Her breakfast blood glucose range is generally 90-110 mg/dl. Her lunch blood glucose range is generally 90-110 mg/dl. Her dinner blood glucose range is generally 90-110 mg/dl. Her highest blood glucose is 90-110 mg/dl. Her overall blood glucose range is 90-110 mg/dl. An ACE inhibitor/angiotensin II receptor blocker is being taken. She does not see a podiatrist.Eye exam is current.  Hyperlipidemia This is a chronic problem. The current episode started more than 1 year ago. The problem is controlled. Recent lipid tests were reviewed and are normal. Exacerbating diseases include chronic renal disease, diabetes and obesity. She has no history of hypothyroidism, liver disease or nephrotic syndrome. Factors aggravating her hyperlipidemia include beta blockers. Pertinent negatives include no chest pain, focal sensory loss, focal weakness, leg  pain, myalgias or shortness of breath. Current antihyperlipidemic treatment includes statins. The current treatment provides moderate improvement of lipids. There are no compliance problems.       Review of Systems  Constitutional: Negative for fever, chills, weight loss, diaphoresis, activity change, appetite change, fatigue and unexpected weight change.  HENT: Negative for sore throat, facial swelling, trouble swallowing, neck pain, neck stiffness and voice change.   Eyes: Negative for blurred vision, photophobia and visual disturbance.  Respiratory: Negative for apnea, cough, choking, chest tightness, shortness of breath, wheezing and stridor.   Cardiovascular: Negative for chest pain, palpitations and leg swelling.  Gastrointestinal: Negative for nausea, vomiting, abdominal pain, diarrhea, constipation and blood in stool.  Genitourinary: Negative for dysuria, urgency, polyuria, frequency, hematuria, decreased urine volume, enuresis, difficulty urinating and dyspareunia.  Musculoskeletal: Negative for myalgias, back pain, joint swelling, arthralgias and gait problem.  Skin: Negative for color change, pallor and rash.  Neurological: Negative for dizziness, tremors, focal weakness, seizures, syncope, facial asymmetry, speech difficulty, weakness, light-headedness, numbness and headaches.  Hematological: Negative for polydipsia, polyphagia and adenopathy. Does not bruise/bleed easily.  Psychiatric/Behavioral: Negative.        Objective:   Physical Exam  Vitals reviewed. Constitutional: She is oriented to person, place, and time. She appears well-developed and well-nourished. No distress.  HENT:  Head: Normocephalic and atraumatic.  Right Ear: External ear normal.  Left Ear: External ear normal.  Nose: Nose normal.  Mouth/Throat: Oropharynx is clear and moist. No oropharyngeal exudate.  Eyes: Conjunctivae and EOM are normal. Pupils are equal, round, and reactive to light. Right eye  exhibits no discharge. Left eye exhibits no discharge. No scleral icterus.  Neck: Normal range of motion. Neck supple. No JVD present. No tracheal deviation present. No thyromegaly present.  Cardiovascular: Normal rate,  regular rhythm, normal heart sounds and intact distal pulses.  Exam reveals no gallop and no friction rub.   No murmur heard. Pulmonary/Chest: Effort normal and breath sounds normal. No stridor. No respiratory distress. She has no wheezes. She has no rales. She exhibits no tenderness.  Abdominal: Soft. Bowel sounds are normal. She exhibits no distension and no mass. There is no tenderness. There is no guarding.  Musculoskeletal: Normal range of motion. She exhibits no edema and no tenderness.  Lymphadenopathy:    She has no cervical adenopathy.  Neurological: She is alert and oriented to person, place, and time. She has normal reflexes. She displays normal reflexes. No cranial nerve deficit. She exhibits normal muscle tone. Coordination normal.  Skin: Skin is warm and dry. No rash noted. She is not diaphoretic. No erythema. No pallor.  Psychiatric: She has a normal mood and affect. Her behavior is normal. Judgment and thought content normal.        Lab Results  Component Value Date   WBC 5.4 12/30/2009   HGB 13.1 07/08/2010   HCT 41.4 07/08/2010   PLT 200.0 12/30/2009   CHOL 163 12/30/2009   TRIG 140.0 12/30/2009   HDL 43.50 12/30/2009   ALT 17 04/02/2010   AST 21 04/02/2010   NA 140 07/08/2010   K 4.1 07/08/2010   CL 102 07/08/2010   CREATININE 2.11* 07/08/2010   BUN 28* 07/08/2010   CO2 31 07/08/2010   TSH 1.51 12/30/2009   HGBA1C 5.8 04/02/2010    Assessment & Plan:

## 2010-08-10 ENCOUNTER — Other Ambulatory Visit (HOSPITAL_COMMUNITY): Payer: Medicare Other

## 2010-08-10 ENCOUNTER — Encounter: Payer: Medicare Other | Attending: Physical Medicine & Rehabilitation

## 2010-08-10 ENCOUNTER — Ambulatory Visit (HOSPITAL_BASED_OUTPATIENT_CLINIC_OR_DEPARTMENT_OTHER): Payer: Medicare Other | Admitting: Physical Medicine & Rehabilitation

## 2010-08-10 DIAGNOSIS — M5430 Sciatica, unspecified side: Secondary | ICD-10-CM

## 2010-08-10 DIAGNOSIS — M545 Low back pain, unspecified: Secondary | ICD-10-CM | POA: Insufficient documentation

## 2010-08-10 DIAGNOSIS — M47817 Spondylosis without myelopathy or radiculopathy, lumbosacral region: Secondary | ICD-10-CM | POA: Insufficient documentation

## 2010-08-10 DIAGNOSIS — R209 Unspecified disturbances of skin sensation: Secondary | ICD-10-CM | POA: Insufficient documentation

## 2010-08-11 NOTE — Assessment & Plan Note (Signed)
Danielle Frazier is a patient who has seen Dr. Letta Pate for primary left-sided low back pain and thigh pain.  She comes in today following up her left L5 dorsal ramus injection and medial branch block.  She rates her pain is 8 about 2.  She states her injections helped quite a bit, may be not quite as much as the first but her pain is greatly improved.  Her sleep pattern is good now.  She has some aggravation with walking, bending, and standing.  Rest and injections seem to help mobility.  She is independent.  She drives.  She climbs steps.  She is retired.  REVIEW OF SYSTEMS:  Notable for those difficulties described above as well as some bladder control issues.  PAST MEDICAL HISTORY:  Unchanged.  SOCIAL HISTORY:  She is married.  She lives with her husband.  FAMILY HISTORY:  Unchanged.  PHYSICAL EXAMINATION:  VITAL SIGNS:  Blood pressure 159/67, pulse 57, respirations 18, O2 sats 97% on room air. NEUROLOGIC:  Motor strength is good in the lower extremities.  Sensation is intact.  Her reflexes are within normal limits.  Constitutionally she is within normal limits.  She is alert and oriented x3.  Her affect is bright and alert.  She is very nice lady.  Her gait is normal at this point.  IMPRESSION: 1. Left-sided low back pain with radiculopathy. 2. Facet arthropathy.  PLAN:  She will follow up with Dr. Letta Pate in 3 months.  No prescriptions were given.  Her questions were encouraged and answered.     Danielle Frazier L. Blenda Nicely Electronically Signed    RLW/MedQ D:  08/10/2010 14:28:02  T:  08/11/2010 03:40:01  Job #:  [id number]

## 2010-08-12 ENCOUNTER — Ambulatory Visit (HOSPITAL_COMMUNITY)
Admission: RE | Admit: 2010-08-12 | Discharge: 2010-08-12 | Disposition: A | Discharge status: Home / Self Care | Payer: Medicare Other | Source: Ambulatory Visit | Attending: Ophthalmology | Admitting: Ophthalmology

## 2010-08-12 DIAGNOSIS — E119 Type 2 diabetes mellitus without complications: Secondary | ICD-10-CM | POA: Insufficient documentation

## 2010-08-12 DIAGNOSIS — Z79899 Other long term (current) drug therapy: Secondary | ICD-10-CM | POA: Insufficient documentation

## 2010-08-12 DIAGNOSIS — H2589 Other age-related cataract: Secondary | ICD-10-CM | POA: Insufficient documentation

## 2010-08-12 DIAGNOSIS — I10 Essential (primary) hypertension: Secondary | ICD-10-CM | POA: Insufficient documentation

## 2010-08-12 LAB — GLUCOSE, CAPILLARY: Glucose-Capillary: 119 mg/dL — ABNORMAL HIGH (ref 70–99)

## 2010-08-23 NOTE — Op Note (Signed)
  NAMEBAILEIGH, Danielle Frazier NO.:  [id number]  MEDICAL RECORD NO.:  [id number]           PATIENT TYPE:  O  LOCATION:  [facility]YP                          FACILITY:  [facility]  PHYSICIAN:  Richardo Hanks, MD       DATE OF BIRTH:  [birth year]  DATE OF PROCEDURE:  08/12/2010 DATE OF DISCHARGE:                              OPERATIVE REPORT   PREOPERATIVE DIAGNOSIS:  Combined cataract, left side; diagnosis code 366.19.  POSTOPERATIVE DIAGNOSIS:  Combined cataract, left side; diagnosis code 366.19.  __________.  PROSTHETIC DEVICE USED:  Lenstec posterior chamber lens, model number is Softec HD, power of 22.75, serial number is [id number].          ______________________________ Richardo Hanks, MD     KEH/MEDQ  D:  08/12/2010  T:  08/13/2010  Job:  [id number]  Electronically Signed by Tonny Branch MD on 08/23/2010 12:19:24 PM

## 2010-08-23 NOTE — Op Note (Signed)
  Danielle Frazier, Danielle Frazier NO.:  [id number]  MEDICAL RECORD NO.:  [id number]           PATIENT TYPE:  O  LOCATION:  [facility]YP                          FACILITY:  [facility]  PHYSICIAN:  Richardo Hanks, MD       DATE OF BIRTH:  07-03-1930  DATE OF PROCEDURE:  07/15/2010 DATE OF DISCHARGE:                              OPERATIVE REPORT   PREOPERATIVE DIAGNOSIS:  Combined cataract, right eye.  Diagnosis code 366.19.  POSTOPERATIVE DIAGNOSIS:  Combined cataract, right eye.  Diagnosis code 366.19.  OPERATION PERFORMED:  Phacoemulsification with posterior chamber intraocular lens implantation, right eye.  SURGEON:  Franky Macho. Dewaun Kinzler, MD  ANESTHESIA:  General endotracheal anesthesia.  OPERATIVE SUMMARY:  In the preoperative area, dilating drops were placed into the right eye.  The patient was then brought into the operating room where she was placed under general anesthesia.  The eye was then prepped and draped.  Beginning with a 75 blade, a paracentesis port was made at the surgeon's 2 o'clock position.  The anterior chamber was then filled with a 1% nonpreserved lidocaine solution with epinephrine.  This was followed by Viscoat to deepen the chamber.  A small fornix-based peritomy was performed superiorly.  Next, a single iris hook was placed through the limbus superiorly.  A 2.4-mm keratome blade was then used to make a clear corneal incision over the iris hook.  A bent cystotome needle and Utrata forceps were used to create a continuous tear capsulotomy.  Hydrodissection was performed using balanced salt solution on a fine cannula.  The lens nucleus was then removed using phacoemulsification in a quadrant cracking technique.  The cortical material was then removed with irrigation and aspiration.  The capsular bag and anterior chamber were refilled with Provisc.  The wound was widened to approximately 3 mm and a posterior chamber intraocular lens was placed into the capsular bag  without difficulty using an [employer] lens injecting system.  A single 10-0 nylon suture was then used to close the incision as well as stromal hydration.  The Provisc was removed from the anterior chamber and capsular bag with irrigation and aspiration.  At this point, the wounds were tested for leak, which were negative.  The anterior chamber remained deep and stable.  The patient tolerated the procedure well.  There were no operative complications, and she awoke from general anesthesia without problem.  Prosthetic device used is a Lenstec posterior chamber lens model Softec HD, power of 22.5, serial number is [id number].          ______________________________ Richardo Hanks, MD     KEH/MEDQ  D:  07/16/2010  T:  07/16/2010  Job:  [id number]  Electronically Signed by Tonny Branch MD on 08/23/2010 12:19:20 PM

## 2010-11-02 ENCOUNTER — Encounter: Payer: Medicare Other | Attending: Physical Medicine & Rehabilitation

## 2010-11-02 ENCOUNTER — Ambulatory Visit: Payer: Medicare Other | Admitting: Physical Medicine & Rehabilitation

## 2010-11-02 DIAGNOSIS — M47817 Spondylosis without myelopathy or radiculopathy, lumbosacral region: Secondary | ICD-10-CM | POA: Insufficient documentation

## 2010-11-02 DIAGNOSIS — R209 Unspecified disturbances of skin sensation: Secondary | ICD-10-CM | POA: Insufficient documentation

## 2010-11-02 DIAGNOSIS — M545 Low back pain, unspecified: Secondary | ICD-10-CM | POA: Insufficient documentation

## 2010-11-11 ENCOUNTER — Telehealth: Payer: Self-pay | Admitting: *Deleted

## 2010-11-11 NOTE — Telephone Encounter (Signed)
Pt left [facility] req samples or RX of med, no name of med given.

## 2010-11-12 ENCOUNTER — Telehealth: Payer: Self-pay

## 2010-11-12 ENCOUNTER — Ambulatory Visit (HOSPITAL_BASED_OUTPATIENT_CLINIC_OR_DEPARTMENT_OTHER): Payer: Medicare Other | Admitting: Physical Medicine & Rehabilitation

## 2010-11-12 DIAGNOSIS — M47817 Spondylosis without myelopathy or radiculopathy, lumbosacral region: Secondary | ICD-10-CM

## 2010-11-12 MED ORDER — CLONAZEPAM 0.5 MG PO TABS: 0.5000 mg | ORAL_TABLET | Freq: Two times a day (BID) | ORAL | Status: DC | PRN

## 2010-11-12 NOTE — Telephone Encounter (Signed)
yes

## 2010-11-12 NOTE — Assessment & Plan Note (Signed)
REASON FOR VISIT:  Back pain.  HISTORY:  A 77 year old female who has lumbar spondylosis.  Her last medial branch block on left side was July 12, 2010.  She has had continued relief from that, she is 4 months post.  Her previous medial branch block lasted from December 03, 2009 until March 2012, this was about 6 months duration.  Pain level is 4/10.  Her Oswestry score 16% this compares with an Oswestry score of 20% on December 02, 2009.  She remains independent with all self-care and mobility.  REVIEW OF SYSTEMS:  Positive for bladder control problems, numbness, blood sugar regulation problems.  She is not really taking her tramadol anymore.  She takes occasionally but not everyday.  PAST MEDICAL HISTORY:  Hypertension, diabetes, and kidney disease.  SOCIAL HISTORY:  Married, lives with husband.  FAMILY HISTORY:  Heart disease, diabetes, hypertension.  PHYSICAL EXAMINATION:  VITAL SIGNS:  Blood pressure 159/57, pulse 56, respirations 16, and O2 sat 95% on room air. GENERAL:  This is an overweight female in no acute stress.  Orientation x3.  Affect alert.  Gait is normal.  Her back has no tenderness to palpation.  She has good forward flexion, extension is limited as this lateral bending left greater than right side.  IMPRESSION:  Lumbar spondylosis most symptomatic on the left, but overall doing well.  Still no significant recurrence 4 months post.  PLAN:  I will see her back in another 4 months.  Certainly if her medication usage increases that would be assigned that the injection is wearing off and she can call to schedule another one between now and then.  We would be doing left-sided medial branch blocks at L3, L4, L5. Discussed with the patient and agrees with plan.     Charlett Blake, M.D. Electronically Signed    AEK/MedQ D:  11/12/2010 13:30:56  T:  11/12/2010 14:29:20  Job #:  [id number]  cc:   Dr. Parks Ranger

## 2010-11-12 NOTE — Telephone Encounter (Signed)
Please advise if ok to refill clonazepam 0.5mg  thanks

## 2010-11-12 NOTE — Telephone Encounter (Signed)
Called in.

## 2010-11-12 NOTE — Telephone Encounter (Signed)
14 day samples place up front for pt pick up. Pt aware.

## 2010-12-09 ENCOUNTER — Other Ambulatory Visit: Payer: Self-pay | Admitting: Internal Medicine

## 2011-01-03 ENCOUNTER — Other Ambulatory Visit (INDEPENDENT_AMBULATORY_CARE_PROVIDER_SITE_OTHER): Payer: Medicare Other

## 2011-01-03 ENCOUNTER — Encounter: Payer: Self-pay | Admitting: Internal Medicine

## 2011-01-03 ENCOUNTER — Ambulatory Visit (INDEPENDENT_AMBULATORY_CARE_PROVIDER_SITE_OTHER): Payer: Medicare Other | Admitting: Internal Medicine

## 2011-01-03 DIAGNOSIS — E119 Type 2 diabetes mellitus without complications: Secondary | ICD-10-CM

## 2011-01-03 DIAGNOSIS — I10 Essential (primary) hypertension: Secondary | ICD-10-CM

## 2011-01-03 DIAGNOSIS — N259 Disorder resulting from impaired renal tubular function, unspecified: Secondary | ICD-10-CM

## 2011-01-03 LAB — URINALYSIS, ROUTINE W REFLEX MICROSCOPIC
Nitrite: NEGATIVE
Specific Gravity, Urine: 1.025 (ref 1.000–1.030)
Total Protein, Urine: 100
pH: 6 (ref 5.0–8.0)

## 2011-01-03 LAB — COMPREHENSIVE METABOLIC PANEL
AST: 28 U/L (ref 0–37)
Alkaline Phosphatase: 73 U/L (ref 39–117)
BUN: 35 mg/dL — ABNORMAL HIGH (ref 6–23)
Creatinine, Ser: 1.9 mg/dL — ABNORMAL HIGH (ref 0.4–1.2)
Total Bilirubin: 0.7 mg/dL (ref 0.3–1.2)

## 2011-01-03 LAB — CBC WITH DIFFERENTIAL/PLATELET
Basophils Absolute: 0 10*3/uL (ref 0.0–0.1)
Eosinophils Absolute: 0.2 10*3/uL (ref 0.0–0.7)
HCT: 40.3 % (ref 36.0–46.0)
Hemoglobin: 13.3 g/dL (ref 12.0–15.0)
Lymphocytes Relative: 29.3 % (ref 12.0–46.0)
Lymphs Abs: 2 10*3/uL (ref 0.7–4.0)
MCHC: 33 g/dL (ref 30.0–36.0)
Neutro Abs: 4.1 10*3/uL (ref 1.4–7.7)
RDW: 13.8 % (ref 11.5–14.6)

## 2011-01-03 NOTE — Assessment & Plan Note (Signed)
I will check her A1C to see if she is well controlled

## 2011-01-03 NOTE — Progress Notes (Signed)
Subjective:    Patient ID: Danielle Frazier, female    DOB: 17-Apr-1930, 77 y.o.   MRN: [id number]  Diabetes She presents for her follow-up diabetic visit. She has type 2 diabetes mellitus. Her disease course has been stable. There are no hypoglycemic associated symptoms. Pertinent negatives for hypoglycemia include no dizziness, headaches, pallor, seizures, speech difficulty or tremors. Pertinent negatives for diabetes include no blurred vision, no chest pain, no fatigue, no foot paresthesias, no foot ulcerations, no polydipsia, no polyphagia, no polyuria, no visual change, no weakness and no weight loss. There are no hypoglycemic complications. Symptoms are stable. There are no diabetic complications. Current diabetic treatment includes oral agent (monotherapy). She is compliant with treatment all of the time. Her weight is stable. She is following a generally healthy diet. Meal planning includes avoidance of concentrated sweets. She has not had a previous visit with a dietician. She never participates in exercise. There is no change in her home blood glucose trend. Her breakfast blood glucose range is generally 90-110 mg/dl. Her lunch blood glucose range is generally 110-130 mg/dl. Her dinner blood glucose range is generally 110-130 mg/dl. Her highest blood glucose is 110-130 mg/dl. Her overall blood glucose range is 110-130 mg/dl. An ACE inhibitor/angiotensin II receptor blocker is being taken. She does not see a podiatrist.Eye exam is current.      Review of Systems  Constitutional: Negative for fever, chills, weight loss, diaphoresis, activity change, appetite change, fatigue and unexpected weight change.  HENT: Negative.   Eyes: Negative.  Negative for blurred vision.  Respiratory: Negative for apnea, cough, choking, chest tightness, shortness of breath, wheezing and stridor.   Cardiovascular: Negative for chest pain, palpitations and leg swelling.  Gastrointestinal: Negative for nausea,  vomiting, abdominal pain, diarrhea and constipation.  Genitourinary: Negative.  Negative for polyuria.  Musculoskeletal: Negative for myalgias, back pain, joint swelling, arthralgias and gait problem.  Skin: Negative for color change, pallor and rash.  Neurological: Negative for dizziness, tremors, seizures, syncope, facial asymmetry, speech difficulty, weakness, light-headedness, numbness and headaches.  Hematological: Negative for polydipsia, polyphagia and adenopathy. Does not bruise/bleed easily.  Psychiatric/Behavioral: Negative.        Objective:   Physical Exam  Vitals reviewed. Constitutional: She is oriented to person, place, and time. She appears well-developed and well-nourished. No distress.  HENT:  Head: Normocephalic and atraumatic.  Mouth/Throat: Oropharynx is clear and moist. No oropharyngeal exudate.  Eyes: Conjunctivae are normal. Right eye exhibits no discharge. Left eye exhibits no discharge. No scleral icterus.  Neck: Normal range of motion. Neck supple. No JVD present. No tracheal deviation present. No thyromegaly present.  Cardiovascular: Normal rate, regular rhythm, normal heart sounds and intact distal pulses.  Exam reveals no gallop and no friction rub.   No murmur heard. Pulmonary/Chest: Effort normal and breath sounds normal. No stridor. No respiratory distress. She has no wheezes. She has no rales. She exhibits no tenderness.  Abdominal: Soft. Bowel sounds are normal. She exhibits no distension and no mass. There is no tenderness. There is no rebound and no guarding.  Musculoskeletal: Normal range of motion. She exhibits no edema and no tenderness.  Lymphadenopathy:    She has no cervical adenopathy.  Neurological: She is oriented to person, place, and time. She displays normal reflexes. No cranial nerve deficit. She exhibits normal muscle tone. Coordination normal.  Skin: Skin is warm and dry. No rash noted. She is not diaphoretic. No erythema. No pallor.    Psychiatric: She has a normal mood  and affect. Her behavior is normal. Judgment and thought content normal.      Lab Results  Component Value Date   WBC 5.4 12/30/2009   HGB 13.1 07/08/2010   HCT 41.4 07/08/2010   PLT 200.0 12/30/2009   GLUCOSE 107* 07/08/2010   CHOL 163 12/30/2009   TRIG 140.0 12/30/2009   HDL 43.50 12/30/2009   LDLCALC 92 12/30/2009   ALT 17 04/02/2010   AST 21 04/02/2010   NA 140 07/08/2010   K 4.1 07/08/2010   CL 102 07/08/2010   CREATININE 2.11* 07/08/2010   BUN 28* 07/08/2010   CO2 31 07/08/2010   TSH 1.51 12/30/2009   HGBA1C 5.8 04/02/2010      Assessment & Plan:

## 2011-01-03 NOTE — Patient Instructions (Signed)

## 2011-01-03 NOTE — Assessment & Plan Note (Signed)
I will monitor her renal function today 

## 2011-01-03 NOTE — Assessment & Plan Note (Signed)
Her BP is well controlled 

## 2011-03-10 ENCOUNTER — Encounter: Payer: Medicare Other | Attending: Physical Medicine & Rehabilitation

## 2011-03-10 ENCOUNTER — Ambulatory Visit (HOSPITAL_BASED_OUTPATIENT_CLINIC_OR_DEPARTMENT_OTHER): Payer: Medicare Other | Admitting: Physical Medicine & Rehabilitation

## 2011-03-10 DIAGNOSIS — R209 Unspecified disturbances of skin sensation: Secondary | ICD-10-CM | POA: Insufficient documentation

## 2011-03-10 DIAGNOSIS — M47817 Spondylosis without myelopathy or radiculopathy, lumbosacral region: Secondary | ICD-10-CM | POA: Insufficient documentation

## 2011-03-10 DIAGNOSIS — G8929 Other chronic pain: Secondary | ICD-10-CM | POA: Insufficient documentation

## 2011-03-10 NOTE — Assessment & Plan Note (Signed)
A 77 year old female with lumbosacral spondylosis, she has done very well after last lumbar medial branch block.  She has had good pain relief since April of 2012.  She states that the injection is still working.  She is using tramadol on a very occasional basis.  She took 1 tablet before cooking Christmas dinner, but otherwise did not use it on a daily basis even.  Her review of systems is positive for bladder control issue, this is a long-term issue, nothing new.  She has some numbness on her left leg which is also old.  Her pain score is 4/10.  Her pain is described as dull.  Her walking tolerance is 15 minutes.  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  PAST HISTORY:  Diabetes, kidney disease, hypertension, but she denies any new diagnoses or new surgeries since I last saw her.  SOCIAL HISTORY:  Married, nonsmoker, nondrinker.  OBJECTIVE:  VITAL SIGNS:  Blood pressure 181/61, pulse 64, respirations 16, O2 sat 88% on room air, weight 214 pounds, height 5 feet 6 inches. GENERAL:  Overweight female, in no acute distress. MUSCULOSKELETAL:  Her back has no tenderness to palpation.  Her skin has multiple seborrheic keratosis.  She has no tenderness over the hips, no pain with hip range of motion.  Lower extremity strength is normal. Lower extremity range of motion is normal.  Gait shows no signs of toe drag or knee instability.  IMPRESSION:  Lumbar spondylosis with chronic pain.  She has been doing well from a pain standpoint, minimal tramadol usage.  As I discussed with her, she is likely going to need another injection at some point, however, I cannot predict exactly how long this injection will last.  It has lasted 8 months so far.  The last injection lasted only about 6 months.  I will see her back in 6 months, but in the meantime, if it wears off sooner, she is to call to reschedule bilateral medial branch blocks L3, 4, 5.     Charlett Blake,  M.D. Electronically Signed    AEK/MedQ D:  03/10/2011 10:22:32  T:  03/10/2011 19:45:34  Job #:  [id number]  cc:   [name], MD [facility] [place] [id number]

## 2011-05-06 ENCOUNTER — Ambulatory Visit (INDEPENDENT_AMBULATORY_CARE_PROVIDER_SITE_OTHER): Payer: Medicare Other | Admitting: Internal Medicine

## 2011-05-06 ENCOUNTER — Encounter: Payer: Self-pay | Admitting: Internal Medicine

## 2011-05-06 ENCOUNTER — Other Ambulatory Visit (INDEPENDENT_AMBULATORY_CARE_PROVIDER_SITE_OTHER): Payer: Medicare Other

## 2011-05-06 DIAGNOSIS — N259 Disorder resulting from impaired renal tubular function, unspecified: Secondary | ICD-10-CM

## 2011-05-06 DIAGNOSIS — I10 Essential (primary) hypertension: Secondary | ICD-10-CM

## 2011-05-06 DIAGNOSIS — E1129 Type 2 diabetes mellitus with other diabetic kidney complication: Secondary | ICD-10-CM

## 2011-05-06 DIAGNOSIS — N058 Unspecified nephritic syndrome with other morphologic changes: Secondary | ICD-10-CM

## 2011-05-06 DIAGNOSIS — M545 Low back pain, unspecified: Secondary | ICD-10-CM

## 2011-05-06 DIAGNOSIS — E1165 Type 2 diabetes mellitus with hyperglycemia: Secondary | ICD-10-CM

## 2011-05-06 DIAGNOSIS — E785 Hyperlipidemia, unspecified: Secondary | ICD-10-CM

## 2011-05-06 DIAGNOSIS — Z79899 Other long term (current) drug therapy: Secondary | ICD-10-CM

## 2011-05-06 LAB — URINALYSIS, ROUTINE W REFLEX MICROSCOPIC
Nitrite: NEGATIVE
Total Protein, Urine: 100
pH: 6 (ref 5.0–8.0)

## 2011-05-06 LAB — CBC WITH DIFFERENTIAL/PLATELET
Basophils Relative: 0.3 % (ref 0.0–3.0)
Eosinophils Absolute: 0.3 10*3/uL (ref 0.0–0.7)
MCHC: 32.7 g/dL (ref 30.0–36.0)
MCV: 97.6 fl (ref 78.0–100.0)
Monocytes Absolute: 0.5 10*3/uL (ref 0.1–1.0)
Neutrophils Relative %: 60.6 % (ref 43.0–77.0)
Platelets: 190 10*3/uL (ref 150.0–400.0)
RDW: 13.8 % (ref 11.5–14.6)

## 2011-05-06 LAB — HEMOGLOBIN A1C: Hgb A1c MFr Bld: 6.1 % (ref 4.6–6.5)

## 2011-05-06 LAB — COMPREHENSIVE METABOLIC PANEL
AST: 21 U/L (ref 0–37)
Albumin: 3.8 g/dL (ref 3.5–5.2)
Alkaline Phosphatase: 75 U/L (ref 39–117)
BUN: 34 mg/dL — ABNORMAL HIGH (ref 6–23)
Potassium: 3.9 mEq/L (ref 3.5–5.1)
Sodium: 141 mEq/L (ref 135–145)

## 2011-05-06 MED ORDER — SITAGLIPTIN PHOSPHATE 100 MG PO TABS: 100.0000 mg | ORAL_TABLET | Freq: Every day | ORAL | Status: DC

## 2011-05-06 MED ORDER — LISINOPRIL-HYDROCHLOROTHIAZIDE 20-25 MG PO TABS: 1.0000 | ORAL_TABLET | Freq: Every day | ORAL | Status: DC

## 2011-05-06 MED ORDER — BAYER CONTOUR MONITOR W/DEVICE KIT: 1.0000 | PACK | Freq: Two times a day (BID) | Status: DC

## 2011-05-06 MED ORDER — NEBIVOLOL HCL 5 MG PO TABS: 5.0000 mg | ORAL_TABLET | Freq: Every day | ORAL | Status: DC

## 2011-05-06 MED ORDER — GLUCOSE BLOOD VI STRP: ORAL_STRIP | Status: DC

## 2011-05-06 MED ORDER — ROSUVASTATIN CALCIUM 10 MG PO TABS: 10.0000 mg | ORAL_TABLET | Freq: Every day | ORAL | Status: DC

## 2011-05-06 NOTE — Assessment & Plan Note (Signed)
Her BS seems to be well controlled, I will check her a1c today

## 2011-05-06 NOTE — Assessment & Plan Note (Signed)
unchanged

## 2011-05-06 NOTE — Assessment & Plan Note (Signed)
She is doing well on crestor, I will check her CPK/FLP/CMP today

## 2011-05-06 NOTE — Assessment & Plan Note (Signed)
Her BP is well controlled, I will monitor her lytes and renal function

## 2011-05-06 NOTE — Patient Instructions (Signed)

## 2011-05-06 NOTE — Progress Notes (Signed)
Subjective:    Patient ID: Danielle Frazier, female    DOB: 1931-02-16, 78 y.o.   MRN: [id number]  Diabetes She presents for her follow-up diabetic visit. She has type 2 diabetes mellitus. Her disease course has been stable. There are no hypoglycemic associated symptoms. Pertinent negatives for hypoglycemia include no dizziness, headaches, pallor, seizures, speech difficulty or tremors. Pertinent negatives for diabetes include no blurred vision, no chest pain, no fatigue, no foot paresthesias, no foot ulcerations, no polydipsia, no polyphagia, no polyuria, no visual change, no weakness and no weight loss. There are no hypoglycemic complications. Symptoms are stable. Diabetic complications include nephropathy. Risk factors for coronary artery disease include no known risk factors. Current diabetic treatment includes oral agent (monotherapy). She is compliant with treatment all of the time. Her weight is stable. She is following a generally healthy diet. Meal planning includes avoidance of concentrated sweets. She participates in exercise intermittently. There is no change in her home blood glucose trend. An ACE inhibitor/angiotensin II receptor blocker is being taken. She does not see a podiatrist.Eye exam is current.      Review of Systems  Constitutional: Negative for fever, chills, weight loss, diaphoresis, activity change, appetite change, fatigue and unexpected weight change.  HENT: Negative.   Eyes: Negative.  Negative for blurred vision.  Respiratory: Negative for cough, chest tightness, shortness of breath, wheezing and stridor.   Cardiovascular: Negative for chest pain, palpitations and leg swelling.  Gastrointestinal: Negative for nausea, vomiting, abdominal pain, diarrhea and constipation.  Genitourinary: Negative for dysuria, urgency, polyuria, frequency, hematuria, flank pain, decreased urine volume, enuresis, difficulty urinating and dyspareunia.  Musculoskeletal: Positive for back pain  (chronic, unchanged) and arthralgias (aching in her large joints). Negative for myalgias, joint swelling and gait problem.  Skin: Negative for color change, pallor, rash and wound.  Neurological: Negative for dizziness, tremors, seizures, syncope, facial asymmetry, speech difficulty, weakness, light-headedness, numbness and headaches.  Hematological: Negative for polydipsia, polyphagia and adenopathy. Does not bruise/bleed easily.  Psychiatric/Behavioral: Negative.        Objective:   Physical Exam  Vitals reviewed. Constitutional: She is oriented to person, place, and time. She appears well-developed and well-nourished. No distress.  HENT:  Head: Normocephalic and atraumatic.  Nose: Nose normal.  Mouth/Throat: Oropharynx is clear and moist. No oropharyngeal exudate.  Eyes: Conjunctivae are normal. Right eye exhibits no discharge. Left eye exhibits no discharge. No scleral icterus.  Neck: Normal range of motion. Neck supple. No JVD present. No tracheal deviation present. No thyromegaly present.  Cardiovascular: Normal rate, regular rhythm, normal heart sounds and intact distal pulses.  Exam reveals no gallop and no friction rub.   No murmur heard. Pulmonary/Chest: Effort normal and breath sounds normal. No stridor. No respiratory distress. She has no wheezes. She has no rales. She exhibits no tenderness.  Abdominal: Soft. Bowel sounds are normal. She exhibits no distension and no mass. There is no tenderness. There is no rebound and no guarding.  Musculoskeletal: Normal range of motion. She exhibits no edema and no tenderness.  Lymphadenopathy:    She has no cervical adenopathy.  Neurological: She is oriented to person, place, and time.  Skin: Skin is warm and dry. No rash noted. She is not diaphoretic. No erythema. No pallor.  Psychiatric: She has a normal mood and affect. Her behavior is normal. Judgment and thought content normal.     Lab Results  Component Value Date   WBC 6.9  01/03/2011   HGB 13.3 01/03/2011  HCT 40.3 01/03/2011   PLT 185.0 01/03/2011   GLUCOSE 132* 01/03/2011   CHOL 163 12/30/2009   TRIG 140.0 12/30/2009   HDL 43.50 12/30/2009   LDLCALC 92 12/30/2009   ALT 24 01/03/2011   AST 28 01/03/2011   NA 144 01/03/2011   K 4.1 01/03/2011   CL 105 01/03/2011   CREATININE 1.9* 01/03/2011   BUN 35* 01/03/2011   CO2 30 01/03/2011   TSH 1.51 12/30/2009   HGBA1C 6.0 01/03/2011       Assessment & Plan:

## 2011-05-06 NOTE — Assessment & Plan Note (Signed)
I will monitor her renal function today 

## 2011-07-08 ENCOUNTER — Other Ambulatory Visit: Payer: Self-pay

## 2011-07-08 MED ORDER — ALLOPURINOL 300 MG PO TABS: ORAL_TABLET | ORAL | Status: DC

## 2011-07-18 ENCOUNTER — Other Ambulatory Visit: Payer: Self-pay | Admitting: Internal Medicine

## 2011-07-19 ENCOUNTER — Telehealth: Payer: Self-pay

## 2011-07-19 NOTE — Telephone Encounter (Signed)
yes

## 2011-07-19 NOTE — Telephone Encounter (Signed)
Received refill request from [facility] request refills for clonazepam 0.5mg  bid. Rx last written 10/25/10 #[id number]/[id number] and pt last seen 05/06/11. Please advise Thanks

## 2011-07-21 NOTE — Telephone Encounter (Signed)
Rx called in 

## 2011-09-06 ENCOUNTER — Ambulatory Visit: Payer: Medicare Other | Admitting: Physical Medicine & Rehabilitation

## 2011-09-07 ENCOUNTER — Encounter: Payer: Self-pay | Admitting: Internal Medicine

## 2011-09-07 ENCOUNTER — Other Ambulatory Visit (INDEPENDENT_AMBULATORY_CARE_PROVIDER_SITE_OTHER): Payer: Medicare Other

## 2011-09-07 ENCOUNTER — Ambulatory Visit (INDEPENDENT_AMBULATORY_CARE_PROVIDER_SITE_OTHER): Payer: Medicare Other | Admitting: Internal Medicine

## 2011-09-07 VITALS — BP 120/70 | HR 52 | Temp 97.4°F | Resp 16 | Wt 213.5 lb

## 2011-09-07 DIAGNOSIS — N259 Disorder resulting from impaired renal tubular function, unspecified: Secondary | ICD-10-CM

## 2011-09-07 DIAGNOSIS — N281 Cyst of kidney, acquired: Secondary | ICD-10-CM

## 2011-09-07 DIAGNOSIS — E785 Hyperlipidemia, unspecified: Secondary | ICD-10-CM

## 2011-09-07 DIAGNOSIS — E1129 Type 2 diabetes mellitus with other diabetic kidney complication: Secondary | ICD-10-CM

## 2011-09-07 DIAGNOSIS — N058 Unspecified nephritic syndrome with other morphologic changes: Secondary | ICD-10-CM

## 2011-09-07 DIAGNOSIS — E1165 Type 2 diabetes mellitus with hyperglycemia: Secondary | ICD-10-CM

## 2011-09-07 DIAGNOSIS — I10 Essential (primary) hypertension: Secondary | ICD-10-CM

## 2011-09-07 DIAGNOSIS — Q619 Cystic kidney disease, unspecified: Secondary | ICD-10-CM

## 2011-09-07 HISTORY — DX: Cyst of kidney, acquired: N28.1

## 2011-09-07 LAB — BASIC METABOLIC PANEL
GFR: 19.86 mL/min — ABNORMAL LOW (ref >60.00)
Glucose, Bld: 110 mg/dL — ABNORMAL HIGH (ref 70–99)
Potassium: 3.7 mEq/L (ref 3.5–5.1)
Sodium: 144 mEq/L (ref 135–145)

## 2011-09-07 LAB — URINALYSIS, ROUTINE W REFLEX MICROSCOPIC
Ketones, ur: NEGATIVE
Specific Gravity, Urine: 1.025 (ref 1.000–1.030)
Total Protein, Urine: 100
Urine Glucose: NEGATIVE
Urobilinogen, UA: 0.2 (ref 0.0–1.0)

## 2011-09-07 LAB — HEMOGLOBIN A1C: Hgb A1c MFr Bld: 6.1 % (ref 4.6–6.5)

## 2011-09-07 NOTE — Assessment & Plan Note (Signed)
Prior scan was done several years ago, I will recheck to see if any of the cysts have become suspicious

## 2011-09-07 NOTE — Assessment & Plan Note (Signed)
I have asked her to get an updated u/s done to see if she has any signs of obstruction, scarring, enlargement - I will recheck her renal function today and will address if needed

## 2011-09-07 NOTE — Assessment & Plan Note (Signed)
I will check her a1c today and will monitor her renal function 

## 2011-09-07 NOTE — Patient Instructions (Signed)
Chronic Renal Insufficiency Chronic renal insufficiency (also called kidney failure) occurs when there is kidney damage done. The damage prevents the kidneys from working like they should.  The kidneys do many important things. They:  Filter waste out of the blood.   Regulate the amount of water and various salts in the blood stream.   Produce chemicals that:   Prompt the bone marrow to make red blood cells.   Regulate blood pressure.   Keep calcium in balance throughout the bones and the body.  When the kidneys are damaged, they can no longer filter waste products out of the blood. These substances build up in the blood, causing illness.  CAUSES   Diabetes.   High blood pressure.   Glomerular diseases: Conditions that damage the tiny blood vessels (glomeruli) within the kidneys, such as:   Membranous nephropathy.   IgA nephropathy.   Focal segmental glomerulosclerosis.   Poisons (such as overdoses or misuse of acetaminophen or NSAIDS, or exposure to other toxic substances).   Kidney injuries.   Kidney cancer or cancer that spreads to the kidney.   Medications such as NSAIDs. These problems are rare.   Kidney stones.   Alport disease.   Polycystic kidneys.  SYMPTOMS  Most people do not notice symptoms of kidney failure until their kidney function drops below about 30-40% of normal. Symptoms can include:  Weakness.   Tiredness.   Frequent urination.   Intense need to urinate.   Excess bruising.   Low urine production.   Blood in the urine.   Pain in the kidney area.   Feeling sick to your stomach (nausea).   Vomiting.   Unusual bleeding.   Numbness in hands and feet.   Swelling in legs, arms and face.   Confusion.  DIAGNOSIS  Your caregiver will look for signs of kidney failure. Tests to diagnose kidney failure may include:  Urine tests: May reveal the presence of blood, protein or sugar.   Blood tests: May show low red blood cell count  (anemia) or high levels of waste products (BUN and creatinine) that are normally filtered out of the bloodstream by the kidneys.   Imaging tests - These are tests that create pictures of the organs inside the abdomen, such as the kidneys. They may reveal masses growing in the kidneys or blockages to the flow of urine. Possible imaging tests may include:   Ultrasound.   CT scan.   MRI.   Intravenous pyelogram or IVP. This is a test that involves injecting dye into the bloodstream and then taking a series of x-rays of the kidneys. This allows the kidneys and other parts of the urinary system to be viewed more clearly.   Kidney biopsy - A small sample of kidney is removed using a special needle. The sample is examined for abnormalities under a microscope.  TREATMENT  Chronic kidney failure cannot usually be cured. The various symptoms are treated, and measures are taken to avoid further kidney damage. Treatment for mild to moderate kidney failure may include:  Medication for high blood pressure.   Good control of diabetes.   Medication and diet change to improve anemia.   A low-sodium, low-potassium, low-protein and/or low-cholesterol diet.   Limiting the quantity of liquids in the diet.  Treatment for more severe kidney failure may require:  Dialysis - Mechanical methods of filtering the blood.   Kidney transplant - An operation that removes the diseased kidney and replaces it with a donated kidney.  HOME  CARE INSTRUCTIONS   Take medication as told by your caregiver.   Quit smoking if you are a smoker. Talk to your caregiver about a smoking cessation program.   Follow your prescribed diet.   If you are prescribed vitamins, take them as told.  SEEK IMMEDIATE MEDICAL CARE IF:  You start to produce less urine.   You notice blood in your urine.   You have increased pain.   You have increased weakness, fatigue or confusion.   You notice new swelling.   You develop a  fever.   You feel that you are having side effects of medicines prescribed.  Document Released: 12/08/2007 Document Revised: 02/17/2011 Document Reviewed: 03/22/2010 [facility] Patient Information 2012 [place].

## 2011-09-07 NOTE — Assessment & Plan Note (Signed)
She has some muscle aches so I have asked her to stop crestor and I will check her CPK level today to see if she has myopathy

## 2011-09-07 NOTE — Assessment & Plan Note (Signed)
Her BP is well controlled, I will check her renal function and lytes

## 2011-09-07 NOTE — Progress Notes (Signed)
Subjective:    Patient ID: Danielle Frazier, female    DOB: [birth year], 78 y.o.   MRN: [id number]  Hypertension This is a chronic problem. The current episode started more than 1 year ago. The problem has been gradually improving since onset. The problem is controlled. Pertinent negatives include no anxiety, blurred vision, chest pain, headaches, malaise/fatigue, neck pain, orthopnea, palpitations, peripheral edema, PND, shortness of breath or sweats. Past treatments include ACE inhibitors, diuretics and beta blockers. The current treatment provides significant improvement. There are no compliance problems.  Identifiable causes of hypertension include chronic renal disease.  Hyperlipidemia This is a chronic problem. The current episode started more than 1 year ago. The problem is controlled. Exacerbating diseases include chronic renal disease, diabetes and obesity. She has no history of hypothyroidism, liver disease or nephrotic syndrome. Factors aggravating her hyperlipidemia include thiazides. Associated symptoms include myalgias. Pertinent negatives include no chest pain, focal sensory loss, focal weakness, leg pain or shortness of breath. Current antihyperlipidemic treatment includes statins. The current treatment provides significant improvement of lipids. There are no compliance problems.       Review of Systems  Constitutional: Negative for fever, chills, malaise/fatigue, diaphoresis, activity change, appetite change, fatigue and unexpected weight change.  HENT: Negative for neck pain.   Eyes: Negative.  Negative for blurred vision.  Respiratory: Negative for cough, chest tightness, shortness of breath, wheezing and stridor.   Cardiovascular: Negative for chest pain, palpitations, orthopnea, leg swelling and PND.  Gastrointestinal: Negative for nausea, vomiting, abdominal pain, diarrhea, constipation, blood in stool and abdominal distention.  Genitourinary: Negative.   Musculoskeletal:  Positive for myalgias and arthralgias. Negative for joint swelling and gait problem.  Skin: Negative for color change, pallor, rash and wound.  Neurological: Negative for dizziness, tremors, focal weakness, seizures, syncope, facial asymmetry, speech difficulty, weakness, light-headedness, numbness and headaches.  Hematological: Negative for adenopathy. Does not bruise/bleed easily.  Psychiatric/Behavioral: Negative.        Objective:   Physical Exam  Vitals reviewed. Constitutional: She is oriented to person, place, and time. She appears well-developed and well-nourished. No distress.  HENT:  Head: Normocephalic and atraumatic.  Mouth/Throat: Oropharynx is clear and moist. No oropharyngeal exudate.  Eyes: Conjunctivae are normal. Right eye exhibits no discharge. Left eye exhibits no discharge. No scleral icterus.  Neck: Normal range of motion. Neck supple. No JVD present. No tracheal deviation present. No thyromegaly present.  Cardiovascular: Normal rate, regular rhythm, normal heart sounds and intact distal pulses.  Exam reveals no gallop and no friction rub.   No murmur heard. Pulmonary/Chest: Effort normal and breath sounds normal. No stridor. No respiratory distress. She has no wheezes. She has no rales. She exhibits no tenderness.  Abdominal: Soft. Bowel sounds are normal. She exhibits no distension and no mass. There is no tenderness. There is no rebound and no guarding.  Musculoskeletal: Normal range of motion. She exhibits no edema and no tenderness.  Lymphadenopathy:    She has no cervical adenopathy.  Neurological: She is oriented to person, place, and time.  Skin: Skin is warm and dry. No rash noted. She is not diaphoretic. No erythema. No pallor.  Psychiatric: She has a normal mood and affect. Her behavior is normal. Judgment and thought content normal.      Lab Results  Component Value Date   WBC 7.5 05/06/2011   HGB 13.4 05/06/2011   HCT 41.2 05/06/2011   PLT 190.0  05/06/2011   GLUCOSE 115* 05/06/2011   CHOL 93 05/06/2011  TRIG 109.0 05/06/2011   HDL 47.60 05/06/2011   LDLCALC 24 05/06/2011   ALT 22 05/06/2011   AST 21 05/06/2011   NA 141 05/06/2011   K 3.9 05/06/2011   CL 104 05/06/2011   CREATININE 2.2* 05/06/2011   BUN 34* 05/06/2011   CO2 32 05/06/2011   TSH 1.51 12/30/2009   HGBA1C 6.1 05/06/2011      Assessment & Plan:

## 2011-09-19 ENCOUNTER — Ambulatory Visit
Admission: RE | Admit: 2011-09-19 | Discharge: 2011-09-19 | Disposition: A | Discharge status: Home / Self Care | Payer: Medicare Other | Source: Ambulatory Visit | Attending: Internal Medicine | Admitting: Internal Medicine

## 2011-09-19 DIAGNOSIS — N259 Disorder resulting from impaired renal tubular function, unspecified: Secondary | ICD-10-CM

## 2011-09-19 DIAGNOSIS — N281 Cyst of kidney, acquired: Secondary | ICD-10-CM

## 2011-09-27 LAB — HM DIABETES EYE EXAM

## 2012-01-04 ENCOUNTER — Ambulatory Visit (INDEPENDENT_AMBULATORY_CARE_PROVIDER_SITE_OTHER): Payer: Medicare Other | Admitting: Internal Medicine

## 2012-01-04 ENCOUNTER — Encounter: Payer: Self-pay | Admitting: Internal Medicine

## 2012-01-04 ENCOUNTER — Other Ambulatory Visit (INDEPENDENT_AMBULATORY_CARE_PROVIDER_SITE_OTHER): Payer: Medicare Other

## 2012-01-04 VITALS — BP 130/78 | HR 60 | Temp 97.6°F | Resp 16 | Ht 66.0 in | Wt 210.5 lb

## 2012-01-04 DIAGNOSIS — N259 Disorder resulting from impaired renal tubular function, unspecified: Secondary | ICD-10-CM

## 2012-01-04 DIAGNOSIS — E1129 Type 2 diabetes mellitus with other diabetic kidney complication: Secondary | ICD-10-CM

## 2012-01-04 DIAGNOSIS — I10 Essential (primary) hypertension: Secondary | ICD-10-CM

## 2012-01-04 DIAGNOSIS — Z23 Encounter for immunization: Secondary | ICD-10-CM

## 2012-01-04 DIAGNOSIS — E1165 Type 2 diabetes mellitus with hyperglycemia: Secondary | ICD-10-CM

## 2012-01-04 LAB — HEMOGLOBIN A1C: Hgb A1c MFr Bld: 6.1 % (ref 4.6–6.5)

## 2012-01-04 LAB — URINALYSIS, ROUTINE W REFLEX MICROSCOPIC
Ketones, ur: NEGATIVE
Total Protein, Urine: 100
Urine Glucose: NEGATIVE
Urobilinogen, UA: 0.2 (ref 0.0–1.0)

## 2012-01-04 LAB — BASIC METABOLIC PANEL
BUN: 39 mg/dL — ABNORMAL HIGH (ref 6–23)
Creatinine, Ser: 2.3 mg/dL — ABNORMAL HIGH (ref 0.4–1.2)
GFR: 21.43 mL/min — ABNORMAL LOW (ref >60.00)
Glucose, Bld: 109 mg/dL — ABNORMAL HIGH (ref 70–99)

## 2012-01-04 NOTE — Assessment & Plan Note (Signed)
I asked her to stop taking aleve, I will recheck her renal function today and will address if worsening function is noted

## 2012-01-04 NOTE — Patient Instructions (Signed)

## 2012-01-04 NOTE — Assessment & Plan Note (Signed)
Her BP is well controlled 

## 2012-01-04 NOTE — Assessment & Plan Note (Addendum)
I will check her a1c and will monitor her renal function today  Late note, her a1c= 6.1 so I have advised her to stop taking Januvia

## 2012-01-04 NOTE — Progress Notes (Signed)
Subjective:    Patient ID: Danielle Frazier, female    DOB: December 16, 1930, 78 y.o.   MRN: [id number]  Hypertension This is a chronic problem. The current episode started more than 1 year ago. The problem is unchanged. The problem is controlled. Pertinent negatives include no anxiety, blurred vision, chest pain, headaches, malaise/fatigue, neck pain, orthopnea, palpitations, peripheral edema, PND, shortness of breath or sweats. Agents associated with hypertension include NSAIDs. Past treatments include beta blockers, diuretics and ACE inhibitors. The current treatment provides significant improvement. Compliance problems include exercise and diet.  Hypertensive end-organ damage includes kidney disease. Identifiable causes of hypertension include chronic renal disease.      Review of Systems  Constitutional: Negative for fever, chills, malaise/fatigue, diaphoresis, activity change, appetite change, fatigue and unexpected weight change.  HENT: Negative.  Negative for neck pain.   Eyes: Negative.  Negative for blurred vision.  Respiratory: Negative for cough, chest tightness, shortness of breath, wheezing and stridor.   Cardiovascular: Negative for chest pain, palpitations, orthopnea, leg swelling and PND.  Gastrointestinal: Negative for nausea, vomiting, abdominal pain, diarrhea and constipation.  Genitourinary: Negative.  Negative for dysuria, urgency, frequency, hematuria, decreased urine volume, enuresis and difficulty urinating.  Musculoskeletal: Positive for arthralgias (knees). Negative for myalgias, back pain, joint swelling and gait problem.  Skin: Negative for color change, pallor, rash and wound.  Neurological: Negative for dizziness, tremors, seizures, syncope, facial asymmetry, speech difficulty, weakness, light-headedness, numbness and headaches.  Hematological: Negative for adenopathy. Does not bruise/bleed easily.  Psychiatric/Behavioral: Negative.        Objective:   Physical Exam    Vitals reviewed. Constitutional: She is oriented to person, place, and time. She appears well-developed and well-nourished. No distress.  HENT:  Head: Normocephalic and atraumatic.  Mouth/Throat: Oropharynx is clear and moist. No oropharyngeal exudate.  Eyes: Conjunctivae normal are normal. Right eye exhibits no discharge. Left eye exhibits no discharge. No scleral icterus.  Neck: Normal range of motion. Neck supple. No JVD present. No tracheal deviation present. No thyromegaly present.  Cardiovascular: Normal rate, regular rhythm, normal heart sounds and intact distal pulses.  Exam reveals no gallop and no friction rub.   No murmur heard. Pulmonary/Chest: Effort normal and breath sounds normal. No stridor. No respiratory distress. She has no wheezes. She has no rales. She exhibits no tenderness.  Abdominal: Soft. Bowel sounds are normal. She exhibits no distension and no mass. There is no tenderness. There is no rebound and no guarding.  Musculoskeletal: Normal range of motion. She exhibits no edema and no tenderness.  Lymphadenopathy:    She has no cervical adenopathy.  Neurological: She is oriented to person, place, and time.  Skin: Skin is warm and dry. No rash noted. She is not diaphoretic. No erythema. No pallor.  Psychiatric: She has a normal mood and affect. Her behavior is normal. Judgment and thought content normal.      Lab Results  Component Value Date   WBC 7.5 05/06/2011   HGB 13.4 05/06/2011   HCT 41.2 05/06/2011   PLT 190.0 05/06/2011   GLUCOSE 110* 09/07/2011   CHOL 93 05/06/2011   TRIG 109.0 05/06/2011   HDL 47.60 05/06/2011   LDLCALC 24 05/06/2011   ALT 22 05/06/2011   AST 21 05/06/2011   NA 144 09/07/2011   K 3.7 09/07/2011   CL 106 09/07/2011   CREATININE 2.5* 09/07/2011   BUN 34* 09/07/2011   CO2 33* 09/07/2011   TSH 1.51 12/30/2009   HGBA1C 6.1 09/07/2011  Assessment & Plan:

## 2012-01-05 ENCOUNTER — Encounter: Payer: Self-pay | Admitting: Internal Medicine

## 2012-01-05 NOTE — Addendum Note (Signed)
Addended by: [name] on: 01/05/2012 07:54 AM   Modules accepted: Orders

## 2012-01-31 LAB — HM MAMMOGRAPHY: HM Mammogram: NORMAL

## 2012-05-21 ENCOUNTER — Encounter: Payer: Self-pay | Admitting: Internal Medicine

## 2012-05-21 ENCOUNTER — Ambulatory Visit (INDEPENDENT_AMBULATORY_CARE_PROVIDER_SITE_OTHER)
Admission: RE | Admit: 2012-05-21 | Discharge: 2012-05-21 | Disposition: A | Discharge status: Home / Self Care | Payer: Medicare Other | Source: Ambulatory Visit | Attending: Internal Medicine | Admitting: Internal Medicine

## 2012-05-21 ENCOUNTER — Other Ambulatory Visit (INDEPENDENT_AMBULATORY_CARE_PROVIDER_SITE_OTHER): Payer: Medicare Other

## 2012-05-21 ENCOUNTER — Ambulatory Visit (INDEPENDENT_AMBULATORY_CARE_PROVIDER_SITE_OTHER): Payer: Medicare Other | Admitting: Internal Medicine

## 2012-05-21 VITALS — BP 146/84 | HR 59 | Temp 97.3°F | Resp 16 | Wt 211.5 lb

## 2012-05-21 DIAGNOSIS — E1129 Type 2 diabetes mellitus with other diabetic kidney complication: Secondary | ICD-10-CM

## 2012-05-21 DIAGNOSIS — S4990XA Unspecified injury of shoulder and upper arm, unspecified arm, initial encounter: Secondary | ICD-10-CM | POA: Insufficient documentation

## 2012-05-21 DIAGNOSIS — M19019 Primary osteoarthritis, unspecified shoulder: Secondary | ICD-10-CM

## 2012-05-21 DIAGNOSIS — E1165 Type 2 diabetes mellitus with hyperglycemia: Secondary | ICD-10-CM

## 2012-05-21 DIAGNOSIS — E785 Hyperlipidemia, unspecified: Secondary | ICD-10-CM

## 2012-05-21 DIAGNOSIS — S4991XA Unspecified injury of right shoulder and upper arm, initial encounter: Secondary | ICD-10-CM

## 2012-05-21 DIAGNOSIS — M545 Low back pain: Secondary | ICD-10-CM

## 2012-05-21 DIAGNOSIS — N259 Disorder resulting from impaired renal tubular function, unspecified: Secondary | ICD-10-CM

## 2012-05-21 DIAGNOSIS — I10 Essential (primary) hypertension: Secondary | ICD-10-CM

## 2012-05-21 DIAGNOSIS — S4980XA Other specified injuries of shoulder and upper arm, unspecified arm, initial encounter: Secondary | ICD-10-CM

## 2012-05-21 HISTORY — DX: Unspecified injury of shoulder and upper arm, unspecified arm, initial encounter: S49.90XA

## 2012-05-21 HISTORY — DX: Primary osteoarthritis, unspecified shoulder: M19.019

## 2012-05-21 LAB — CBC WITH DIFFERENTIAL/PLATELET
Basophils Absolute: 0 10*3/uL (ref 0.0–0.1)
Eosinophils Absolute: 0.2 10*3/uL (ref 0.0–0.7)
HCT: 41.7 % (ref 36.0–46.0)
Hemoglobin: 13.7 g/dL (ref 12.0–15.0)
Lymphs Abs: 2.4 10*3/uL (ref 0.7–4.0)
MCHC: 32.8 g/dL (ref 30.0–36.0)
Monocytes Absolute: 0.4 10*3/uL (ref 0.1–1.0)
Monocytes Relative: 6.6 % (ref 3.0–12.0)
Neutro Abs: 3.4 10*3/uL (ref 1.4–7.7)
Platelets: 203 10*3/uL (ref 150.0–400.0)
RDW: 14.1 % (ref 11.5–14.6)

## 2012-05-21 LAB — LIPID PANEL
HDL: 38.3 mg/dL — ABNORMAL LOW (ref >39.00)
Total CHOL/HDL Ratio: 4
VLDL: 33.6 mg/dL (ref 0.0–40.0)

## 2012-05-21 LAB — COMPREHENSIVE METABOLIC PANEL
ALT: 33 U/L (ref 0–35)
AST: 32 U/L (ref 0–37)
CO2: 30 mEq/L (ref 19–32)
Chloride: 106 mEq/L (ref 96–112)
Creatinine, Ser: 2.1 mg/dL — ABNORMAL HIGH (ref 0.4–1.2)
GFR: 24.54 mL/min — ABNORMAL LOW (ref >60.00)
Sodium: 142 mEq/L (ref 135–145)
Total Bilirubin: 0.9 mg/dL (ref 0.3–1.2)
Total Protein: 6.8 g/dL (ref 6.0–8.3)

## 2012-05-21 LAB — HEMOGLOBIN A1C: Hgb A1c MFr Bld: 6.2 % (ref 4.6–6.5)

## 2012-05-21 LAB — TSH: TSH: 2.26 u[IU]/mL (ref 0.35–5.50)

## 2012-05-21 MED ORDER — CELECOXIB 200 MG PO CAPS: 200.0000 mg | ORAL_CAPSULE | Freq: Every day | ORAL | Status: DC

## 2012-05-21 NOTE — Patient Instructions (Signed)
Shoulder Pain The shoulder is the joint that connects your arms to your body. The bones that form the shoulder joint include the upper arm bone (humerus), the shoulder blade (scapula), and the collarbone (clavicle). The top of the humerus is shaped like a ball and fits into a rather flat socket on the scapula (glenoid cavity). A combination of muscles and strong, fibrous tissues that connect muscles to bones (tendons) support your shoulder joint and hold the ball in the socket. Small, fluid-filled sacs (bursae) are located in different areas of the joint. They act as cushions between the bones and the overlying soft tissues and help reduce friction between the gliding tendons and the bone as you move your arm. Your shoulder joint allows a wide range of motion in your arm. This range of motion allows you to do things like scratch your back or throw a ball. However, this range of motion also makes your shoulder more prone to pain from overuse and injury. Causes of shoulder pain can originate from both injury and overuse and usually can be grouped in the following four categories:  Redness, swelling, and pain (inflammation) of the tendon (tendinitis) or the bursae (bursitis).  Instability, such as a dislocation of the joint.  Inflammation of the joint (arthritis).  Broken bone (fracture). HOME CARE INSTRUCTIONS   Apply ice to the sore area.  Put ice in a plastic bag.  Place a towel between your skin and the bag.  Leave the ice on for 15 to 20 minutes, 3 to 4 times per day for the first 2 days.  If you have a shoulder sling or immobilizer, wear it as long as your caregiver instructs. Only remove it to shower or bathe. Move your arm as little as possible, but keep your hand moving to prevent swelling.  Only take over-the-counter or prescription medicines for pain, discomfort, or fever as directed by your caregiver. SEEK MEDICAL CARE IF:   Your shoulder pain increases, or new pain develops in  your arm, hand, or fingers.  Your hand or fingers become cold and numb.  Your pain is not relieved with medicines. SEEK IMMEDIATE MEDICAL CARE IF:   Your arm, hand, or fingers are numb or tingling.  Your arm, hand, or fingers are significantly swollen or turn white or blue. MAKE SURE YOU:   Understand these instructions.  Will watch your condition.  Will get help right away if you are not doing well or get worse. Document Released: 12/08/2004 Document Revised: 05/23/2011 Document Reviewed: 02/12/2011 [employer] Patient Information 2013 [employer].  

## 2012-05-21 NOTE — Assessment & Plan Note (Signed)
Continue tramadol as needed 

## 2012-05-21 NOTE — Assessment & Plan Note (Signed)
Her BP is well controlled Today I will check her lytes and renal function 

## 2012-05-21 NOTE — Assessment & Plan Note (Signed)
I will check her a1c today and will treat if needed

## 2012-05-21 NOTE — Assessment & Plan Note (Signed)
I will monitor her renal function today. If it is declining then I will advise further.

## 2012-05-21 NOTE — Progress Notes (Signed)
Subjective:    Patient ID: Danielle Frazier, female    DOB: 1931-03-09, 79 y.o.   MRN: [id number]  Shoulder Injury  The incident occurred at home. The right shoulder is affected. Incident onset: 6 weeks ago. The injury mechanism was a fall. The quality of the pain is described as aching. The pain does not radiate. The pain is at a severity of 3/10. The pain is mild. Pertinent negatives include no chest pain, muscle weakness, numbness or tingling. The symptoms are aggravated by movement and palpation. Treatments tried: tramadol. The treatment provided mild relief.      Review of Systems  Constitutional: Negative for fever, chills, diaphoresis, activity change, appetite change, fatigue and unexpected weight change.  HENT: Negative.   Eyes: Negative.   Respiratory: Negative for cough, chest tightness, shortness of breath, wheezing and stridor.   Cardiovascular: Negative for chest pain, palpitations and leg swelling.  Gastrointestinal: Negative for nausea, vomiting, abdominal pain, diarrhea, constipation, blood in stool and abdominal distention.  Endocrine: Negative.   Genitourinary: Negative.   Musculoskeletal: Positive for back pain and arthralgias. Negative for myalgias, joint swelling and gait problem.  Skin: Negative for color change, pallor, rash and wound.  Allergic/Immunologic: Negative.   Neurological: Negative for dizziness, tingling, weakness and numbness.  Hematological: Negative for adenopathy. Does not bruise/bleed easily.  Psychiatric/Behavioral: Negative.        Objective:   Physical Exam  Vitals reviewed. Constitutional: She is oriented to person, place, and time. She appears well-developed and well-nourished. No distress.  HENT:  Head: Normocephalic and atraumatic.  Mouth/Throat: Oropharynx is clear and moist. No oropharyngeal exudate.  Eyes: Conjunctivae are normal. Right eye exhibits no discharge. Left eye exhibits no discharge. No scleral icterus.  Neck: Normal  range of motion. Neck supple. No JVD present. No tracheal deviation present. No thyromegaly present.  Cardiovascular: Normal rate, regular rhythm, normal heart sounds and intact distal pulses.  Exam reveals no gallop and no friction rub.   No murmur heard. Pulmonary/Chest: Effort normal and breath sounds normal. No stridor. No respiratory distress. She has no wheezes. She has no rales. She exhibits no tenderness.  Abdominal: Soft. Bowel sounds are normal. She exhibits no distension and no mass. There is no tenderness. There is no rebound and no guarding.  Musculoskeletal: She exhibits no edema and no tenderness.       Right shoulder: She exhibits decreased range of motion, tenderness and bony tenderness. She exhibits no swelling, no effusion, no crepitus, no deformity, no laceration, no pain, no spasm, normal pulse and normal strength.  Lymphadenopathy:    She has no cervical adenopathy.  Neurological: She is oriented to person, place, and time.  Skin: Skin is warm and dry. No rash noted. She is not diaphoretic. No erythema. No pallor.  Psychiatric: She has a normal mood and affect. Her behavior is normal. Judgment and thought content normal.     Lab Results  Component Value Date   WBC 7.5 05/06/2011   HGB 13.4 05/06/2011   HCT 41.2 05/06/2011   PLT 190.0 05/06/2011   GLUCOSE 109* 01/04/2012   CHOL 93 05/06/2011   TRIG 109.0 05/06/2011   HDL 47.60 05/06/2011   LDLCALC 24 05/06/2011   ALT 22 05/06/2011   AST 21 05/06/2011   NA 143 01/04/2012   K 4.6 01/04/2012   CL 106 01/04/2012   CREATININE 2.3* 01/04/2012   BUN 39* 01/04/2012   CO2 31 01/04/2012   TSH 1.51 12/30/2009   HGBA1C 6.1 01/04/2012  Assessment & Plan:

## 2012-05-21 NOTE — Assessment & Plan Note (Signed)
The plain xray shows DJD but no fracture She will try celebrex I have recommended that she see ortho

## 2012-05-21 NOTE — Assessment & Plan Note (Signed)
Repeat FLP today.

## 2012-07-12 ENCOUNTER — Other Ambulatory Visit: Payer: Self-pay | Admitting: Internal Medicine

## 2012-07-23 ENCOUNTER — Ambulatory Visit (INDEPENDENT_AMBULATORY_CARE_PROVIDER_SITE_OTHER)
Admission: RE | Admit: 2012-07-23 | Discharge: 2012-07-23 | Disposition: A | Discharge status: Home / Self Care | Payer: Medicare Other | Source: Ambulatory Visit | Attending: Internal Medicine | Admitting: Internal Medicine

## 2012-07-23 ENCOUNTER — Ambulatory Visit (INDEPENDENT_AMBULATORY_CARE_PROVIDER_SITE_OTHER): Payer: Medicare Other | Admitting: Internal Medicine

## 2012-07-23 ENCOUNTER — Encounter: Payer: Self-pay | Admitting: Internal Medicine

## 2012-07-23 VITALS — BP 110/70 | HR 65 | Temp 97.1°F | Resp 16 | Wt 211.0 lb

## 2012-07-23 DIAGNOSIS — M109 Gout, unspecified: Secondary | ICD-10-CM

## 2012-07-23 DIAGNOSIS — M25551 Pain in right hip: Secondary | ICD-10-CM

## 2012-07-23 DIAGNOSIS — I10 Essential (primary) hypertension: Secondary | ICD-10-CM

## 2012-07-23 DIAGNOSIS — M25559 Pain in unspecified hip: Secondary | ICD-10-CM

## 2012-07-23 DIAGNOSIS — G8929 Other chronic pain: Secondary | ICD-10-CM

## 2012-07-23 DIAGNOSIS — N259 Disorder resulting from impaired renal tubular function, unspecified: Secondary | ICD-10-CM

## 2012-07-23 DIAGNOSIS — E1165 Type 2 diabetes mellitus with hyperglycemia: Secondary | ICD-10-CM

## 2012-07-23 DIAGNOSIS — E1129 Type 2 diabetes mellitus with other diabetic kidney complication: Secondary | ICD-10-CM

## 2012-07-23 HISTORY — DX: Other chronic pain: G89.29

## 2012-07-23 MED ORDER — LISINOPRIL-HYDROCHLOROTHIAZIDE 20-25 MG PO TABS: 1.0000 | ORAL_TABLET | Freq: Every day | ORAL | Status: DC

## 2012-07-23 MED ORDER — ALLOPURINOL 300 MG PO TABS: ORAL_TABLET | ORAL | Status: DC

## 2012-07-23 NOTE — Assessment & Plan Note (Signed)
Her BP is well controlled 

## 2012-07-23 NOTE — Patient Instructions (Signed)

## 2012-07-23 NOTE — Assessment & Plan Note (Signed)
I will check a plain film today to see if she a fracture, djd, etc She will continue meds for pain

## 2012-07-23 NOTE — Progress Notes (Signed)
  Subjective:    Patient ID: Danielle Frazier, female    DOB: 24-May-1930, 79 y.o.   MRN: [id number]  Hip Pain  Incident onset: one month ago. There was no injury mechanism. The pain is present in the right hip. The quality of the pain is described as aching. The pain is at a severity of 2/10. The pain is mild. The pain has been intermittent since onset. Pertinent negatives include no inability to bear weight, loss of motion, loss of sensation, muscle weakness, numbness or tingling. She has tried NSAIDs and acetaminophen (tramadol) for the symptoms. The treatment provided moderate relief.      Review of Systems  Constitutional: Negative.   HENT: Negative.   Eyes: Negative.   Respiratory: Negative.  Negative for shortness of breath, wheezing and stridor.   Cardiovascular: Negative.  Negative for chest pain, palpitations and leg swelling.  Gastrointestinal: Negative.   Endocrine: Negative.   Genitourinary: Negative.   Musculoskeletal: Positive for arthralgias. Negative for myalgias, back pain, joint swelling and gait problem.  Skin: Negative.   Allergic/Immunologic: Negative.   Neurological: Negative.  Negative for dizziness, tingling, weakness and numbness.  Hematological: Negative.   Psychiatric/Behavioral: Negative.        Objective:   Physical Exam  Vitals reviewed. Constitutional: She is oriented to person, place, and time. She appears well-developed and well-nourished. No distress.  HENT:  Head: Normocephalic and atraumatic.  Mouth/Throat: Oropharynx is clear and moist. No oropharyngeal exudate.  Eyes: Conjunctivae are normal. Right eye exhibits no discharge. Left eye exhibits no discharge. No scleral icterus.  Neck: Normal range of motion. Neck supple. No JVD present. No tracheal deviation present. No thyromegaly present.  Cardiovascular: Normal rate, regular rhythm and intact distal pulses.  Exam reveals no gallop and no friction rub.   No murmur heard. Pulmonary/Chest: Effort  normal and breath sounds normal. No stridor. No respiratory distress. She has no wheezes. She has no rales. She exhibits no tenderness.  Abdominal: Soft. Bowel sounds are normal. She exhibits no distension and no mass. There is no tenderness. There is no rebound and no guarding.  Musculoskeletal: Normal range of motion. She exhibits no edema and no tenderness.       Right hip: Normal. She exhibits normal range of motion, normal strength, no tenderness, no bony tenderness, no swelling, no crepitus, no deformity and no laceration.  Lymphadenopathy:    She has no cervical adenopathy.  Neurological: She is oriented to person, place, and time.  Skin: Skin is warm and dry. No rash noted. She is not diaphoretic. No erythema. No pallor.  Psychiatric: She has a normal mood and affect. Her behavior is normal. Judgment and thought content normal.     Lab Results  Component Value Date   WBC 6.6 05/21/2012   HGB 13.7 05/21/2012   HCT 41.7 05/21/2012   PLT 203.0 05/21/2012   GLUCOSE 128* 05/21/2012   CHOL 143 05/21/2012   TRIG 168.0* 05/21/2012   HDL 38.30* 05/21/2012   LDLCALC 71 05/21/2012   ALT 33 05/21/2012   AST 32 05/21/2012   NA 142 05/21/2012   K 4.1 05/21/2012   CL 106 05/21/2012   CREATININE 2.1* 05/21/2012   BUN 34* 05/21/2012   CO2 30 05/21/2012   TSH 2.26 05/21/2012   HGBA1C 6.2 05/21/2012       Assessment & Plan:

## 2012-07-23 NOTE — Assessment & Plan Note (Signed)
Her blood sugar is well controlled

## 2012-08-20 ENCOUNTER — Ambulatory Visit (INDEPENDENT_AMBULATORY_CARE_PROVIDER_SITE_OTHER): Payer: Medicare Other | Admitting: Internal Medicine

## 2012-08-20 ENCOUNTER — Encounter: Payer: Self-pay | Admitting: Internal Medicine

## 2012-08-20 VITALS — BP 140/82 | HR 59 | Temp 97.5°F | Resp 16 | Wt 209.0 lb

## 2012-08-20 DIAGNOSIS — M545 Low back pain: Secondary | ICD-10-CM

## 2012-08-20 DIAGNOSIS — I10 Essential (primary) hypertension: Secondary | ICD-10-CM

## 2012-08-20 DIAGNOSIS — M109 Gout, unspecified: Secondary | ICD-10-CM

## 2012-08-20 DIAGNOSIS — M25559 Pain in unspecified hip: Secondary | ICD-10-CM

## 2012-08-20 MED ORDER — ALLOPURINOL 300 MG PO TABS: ORAL_TABLET | ORAL | Status: DC

## 2012-08-20 MED ORDER — TRAMADOL HCL 50 MG PO TABS: 50.0000 mg | ORAL_TABLET | Freq: Four times a day (QID) | ORAL | Status: DC | PRN

## 2012-08-20 NOTE — Assessment & Plan Note (Signed)
Her LBP has improved She will continue her current meds

## 2012-08-20 NOTE — Progress Notes (Signed)
Subjective:    Patient ID: Danielle Frazier, female    DOB: 1930/10/31, 79 y.o.   MRN: [id number]  Back Pain This is a recurrent problem. The current episode started more than 1 month ago. The problem occurs intermittently. The problem has been gradually improving since onset. The pain is present in the lumbar spine. The quality of the pain is described as aching. The pain does not radiate. The pain is at a severity of 2/10. The pain is mild. The pain is worse during the day. The symptoms are aggravated by bending and standing. Pertinent negatives include no abdominal pain, bladder incontinence, bowel incontinence, chest pain, dysuria, fever, headaches, leg pain, numbness, paresis, paresthesias, pelvic pain, perianal numbness, tingling, weakness or weight loss. She has tried NSAIDs and analgesics for the symptoms. The treatment provided significant relief.      Review of Systems  Constitutional: Negative.  Negative for fever and weight loss.  HENT: Negative.   Eyes: Negative.   Respiratory: Negative.  Negative for cough, chest tightness, shortness of breath, wheezing and stridor.   Cardiovascular: Negative.  Negative for chest pain, palpitations and leg swelling.  Gastrointestinal: Negative.  Negative for nausea, vomiting, abdominal pain, diarrhea, constipation and bowel incontinence.  Endocrine: Negative.   Genitourinary: Negative.  Negative for bladder incontinence, dysuria and pelvic pain.  Musculoskeletal: Positive for back pain. Negative for myalgias, joint swelling, arthralgias and gait problem.  Skin: Negative.   Allergic/Immunologic: Negative.   Neurological: Negative.  Negative for tingling, weakness, numbness, headaches and paresthesias.  Hematological: Negative.   Psychiatric/Behavioral: Negative.        Objective:   Physical Exam  Vitals reviewed. Constitutional: She is oriented to person, place, and time. She appears well-developed and well-nourished. No distress.  HENT:   Head: Normocephalic and atraumatic.  Mouth/Throat: Oropharynx is clear and moist. No oropharyngeal exudate.  Eyes: Conjunctivae are normal. Right eye exhibits no discharge. Left eye exhibits no discharge. No scleral icterus.  Neck: Normal range of motion. Neck supple. No JVD present. No tracheal deviation present. No thyromegaly present.  Cardiovascular: Normal rate, regular rhythm, normal heart sounds and intact distal pulses.  Exam reveals no gallop and no friction rub.   No murmur heard. Pulmonary/Chest: Effort normal and breath sounds normal. No stridor. No respiratory distress. She has no wheezes. She has no rales. She exhibits no tenderness.  Abdominal: Soft. Bowel sounds are normal. She exhibits no distension and no mass. There is no tenderness. There is no rebound and no guarding.  Musculoskeletal: Normal range of motion. She exhibits no edema and no tenderness.  Lymphadenopathy:    She has no cervical adenopathy.  Neurological: She is oriented to person, place, and time.  Skin: Skin is warm and dry. No rash noted. She is not diaphoretic. No erythema. No pallor.  Psychiatric: She has a normal mood and affect. Her behavior is normal. Judgment and thought content normal.      Lab Results  Component Value Date   WBC 6.6 05/21/2012   HGB 13.7 05/21/2012   HCT 41.7 05/21/2012   PLT 203.0 05/21/2012   GLUCOSE 128* 05/21/2012   CHOL 143 05/21/2012   TRIG 168.0* 05/21/2012   HDL 38.30* 05/21/2012   LDLCALC 71 05/21/2012   ALT 33 05/21/2012   AST 32 05/21/2012   NA 142 05/21/2012   K 4.1 05/21/2012   CL 106 05/21/2012   CREATININE 2.1* 05/21/2012   BUN 34* 05/21/2012   CO2 30 05/21/2012   TSH 2.26 05/21/2012  HGBA1C 6.2 05/21/2012      Assessment & Plan:

## 2012-08-20 NOTE — Patient Instructions (Signed)
Back Pain, Adult  Low back pain is very common. About 1 in 5 people have back pain. The cause of low back pain is rarely dangerous. The pain often gets better over time. About half of people with a sudden onset of back pain feel better in just 2 weeks. About 8 in 10 people feel better by 6 weeks.   CAUSES  Some common causes of back pain include:  · Strain of the muscles or ligaments supporting the spine.  · Wear and tear (degeneration) of the spinal discs.  · Arthritis.  · Direct injury to the back.  DIAGNOSIS  Most of the time, the direct cause of low back pain is not known. However, back pain can be treated effectively even when the exact cause of the pain is unknown. Answering your caregiver's questions about your overall health and symptoms is one of the most accurate ways to make sure the cause of your pain is not dangerous. If your caregiver needs more information, he or she may order lab work or imaging tests (X-rays or MRIs). However, even if imaging tests show changes in your back, this usually does not require surgery.  HOME CARE INSTRUCTIONS  For many people, back pain returns. Since low back pain is rarely dangerous, it is often a condition that people can learn to manage on their own.   · Remain active. It is stressful on the back to sit or stand in one place. Do not sit, drive, or stand in one place for more than 30 minutes at a time. Take short walks on level surfaces as soon as pain allows. Try to increase the length of time you walk each day.  · Do not stay in bed. Resting more than 1 or 2 days can delay your recovery.  · Do not avoid exercise or work. Your body is made to move. It is not dangerous to be active, even though your back may hurt. Your back will likely heal faster if you return to being active before your pain is gone.  · Pay attention to your body when you  bend and lift. Many people have less discomfort when lifting if they bend their knees, keep the load close to their bodies, and  avoid twisting. Often, the most comfortable positions are those that put less stress on your recovering back.  · Find a comfortable position to sleep. Use a firm mattress and lie on your side with your knees slightly bent. If you lie on your back, put a pillow under your knees.  · Only take over-the-counter or prescription medicines as directed by your caregiver. Over-the-counter medicines to reduce pain and inflammation are often the most helpful. Your caregiver may prescribe muscle relaxant drugs. These medicines help dull your pain so you can more quickly return to your normal activities and healthy exercise.  · Put ice on the injured area.  · Put ice in a plastic bag.  · Place a towel between your skin and the bag.  · Leave the ice on for 15-20 minutes, 3-4 times a day for the first 2 to 3 days. After that, ice and heat may be alternated to reduce pain and spasms.  · Ask your caregiver about trying back exercises and gentle massage. This may be of some benefit.  · Avoid feeling anxious or stressed. Stress increases muscle tension and can worsen back pain. It is important to recognize when you are anxious or stressed and learn ways to manage it. Exercise is a great option.  SEEK MEDICAL CARE IF:  · You have pain that is not relieved with rest or   medicine.  · You have pain that does not improve in 1 week.  · You have new symptoms.  · You are generally not feeling well.  SEEK IMMEDIATE MEDICAL CARE IF:   · You have pain that radiates from your back into your legs.  · You develop new bowel or bladder control problems.  · You have unusual weakness or numbness in your arms or legs.  · You develop nausea or vomiting.  · You develop abdominal pain.  · You feel faint.  Document Released: 02/28/2005 Document Revised: 08/30/2011 Document Reviewed: 07/19/2010  Exit[employer]® Patient Information ©2014 [employer].

## 2012-08-20 NOTE — Assessment & Plan Note (Signed)
Her BP is well controlled 

## 2012-10-02 LAB — HM DIABETES EYE EXAM

## 2012-10-27 ENCOUNTER — Other Ambulatory Visit: Payer: Self-pay | Admitting: Internal Medicine

## 2012-11-20 ENCOUNTER — Ambulatory Visit (INDEPENDENT_AMBULATORY_CARE_PROVIDER_SITE_OTHER)
Admission: RE | Admit: 2012-11-20 | Discharge: 2012-11-20 | Disposition: A | Discharge status: Home / Self Care | Payer: Medicare Other | Source: Ambulatory Visit | Attending: Internal Medicine | Admitting: Internal Medicine

## 2012-11-20 ENCOUNTER — Encounter: Payer: Self-pay | Admitting: Internal Medicine

## 2012-11-20 ENCOUNTER — Ambulatory Visit (INDEPENDENT_AMBULATORY_CARE_PROVIDER_SITE_OTHER): Payer: Medicare Other | Admitting: Internal Medicine

## 2012-11-20 ENCOUNTER — Other Ambulatory Visit (INDEPENDENT_AMBULATORY_CARE_PROVIDER_SITE_OTHER): Payer: Medicare Other

## 2012-11-20 VITALS — BP 120/84 | HR 64 | Temp 97.2°F | Resp 16 | Wt 210.0 lb

## 2012-11-20 DIAGNOSIS — M546 Pain in thoracic spine: Secondary | ICD-10-CM

## 2012-11-20 DIAGNOSIS — E1129 Type 2 diabetes mellitus with other diabetic kidney complication: Secondary | ICD-10-CM

## 2012-11-20 DIAGNOSIS — I10 Essential (primary) hypertension: Secondary | ICD-10-CM

## 2012-11-20 LAB — CBC WITH DIFFERENTIAL/PLATELET
Basophils Absolute: 0 10*3/uL (ref 0.0–0.1)
Eosinophils Absolute: 0.2 10*3/uL (ref 0.0–0.7)
Lymphocytes Relative: 23.7 % (ref 12.0–46.0)
MCHC: 32.8 g/dL (ref 30.0–36.0)
Monocytes Relative: 5.4 % (ref 3.0–12.0)
Neutro Abs: 5.6 10*3/uL (ref 1.4–7.7)
Neutrophils Relative %: 68.7 % (ref 43.0–77.0)
Platelets: 218 10*3/uL (ref 150.0–400.0)
RDW: 14.3 % (ref 11.5–14.6)

## 2012-11-20 LAB — BASIC METABOLIC PANEL
CO2: 30 mEq/L (ref 19–32)
Chloride: 106 mEq/L (ref 96–112)
Creatinine, Ser: 2.2 mg/dL — ABNORMAL HIGH (ref 0.4–1.2)
Potassium: 3.8 mEq/L (ref 3.5–5.1)
Sodium: 143 mEq/L (ref 135–145)

## 2012-11-20 LAB — HEMOGLOBIN A1C: Hgb A1c MFr Bld: 6.3 % (ref 4.6–6.5)

## 2012-11-20 NOTE — Progress Notes (Signed)
Subjective:    Patient ID: Danielle Frazier, female    DOB: 1930/12/20, 79 y.o.   MRN: [id number]  Back Pain This is a new problem. The current episode started in the past 7 days. The problem occurs intermittently. The pain is present in the thoracic spine. The quality of the pain is described as aching. The pain is at a severity of 2/10. The pain is mild. The pain is worse during the day. The symptoms are aggravated by standing and position. Pertinent negatives include no abdominal pain, bladder incontinence, bowel incontinence, chest pain, dysuria, fever, headaches, leg pain, numbness, paresis, paresthesias, pelvic pain, perianal numbness, tingling, weakness or weight loss. She has tried analgesics and NSAIDs for the symptoms. The treatment provided moderate relief.      Review of Systems  Constitutional: Negative.  Negative for fever, chills, weight loss, diaphoresis, appetite change and fatigue.  HENT: Negative.   Eyes: Negative.   Respiratory: Negative.  Negative for apnea, cough, choking, chest tightness, shortness of breath, wheezing and stridor.   Cardiovascular: Negative.  Negative for chest pain and leg swelling.  Gastrointestinal: Negative.  Negative for nausea, vomiting, abdominal pain, diarrhea, constipation and bowel incontinence.  Endocrine: Negative.  Negative for polydipsia, polyphagia and polyuria.  Genitourinary: Negative.  Negative for bladder incontinence, dysuria and pelvic pain.  Musculoskeletal: Positive for back pain. Negative for myalgias, joint swelling and gait problem.  Skin: Negative.   Allergic/Immunologic: Negative.   Neurological: Negative.  Negative for dizziness, tingling, tremors, seizures, syncope, facial asymmetry, speech difficulty, weakness, light-headedness, numbness, headaches and paresthesias.  Hematological: Negative.  Negative for adenopathy. Does not bruise/bleed easily.  Psychiatric/Behavioral: Negative.        Objective:   Physical Exam   Vitals reviewed. Constitutional: She is oriented to person, place, and time. She appears well-developed and well-nourished. No distress.  HENT:  Head: Normocephalic and atraumatic.  Mouth/Throat: Oropharynx is clear and moist. No oropharyngeal exudate.  Eyes: Conjunctivae are normal. Right eye exhibits no discharge. Left eye exhibits no discharge. No scleral icterus.  Neck: Normal range of motion. Neck supple. No JVD present. No tracheal deviation present. No thyromegaly present.  Cardiovascular: Normal rate, regular rhythm, normal heart sounds and intact distal pulses.  Exam reveals no gallop and no friction rub.   No murmur heard. Pulmonary/Chest: Effort normal and breath sounds normal. No stridor. No respiratory distress. She has no wheezes. She has no rales. She exhibits no tenderness.  Abdominal: Soft. Bowel sounds are normal. She exhibits no distension and no mass. There is no tenderness. There is no rebound and no guarding.  Musculoskeletal: Normal range of motion. She exhibits no edema and no tenderness.       Cervical back: Normal.       Thoracic back: Normal. She exhibits normal range of motion, no tenderness, no bony tenderness, no swelling, no edema, no deformity, no laceration, no pain, no spasm and normal pulse.       Lumbar back: Normal.  Lymphadenopathy:    She has no cervical adenopathy.  Neurological: She is alert and oriented to person, place, and time. She has normal reflexes. She displays normal reflexes. No cranial nerve deficit. She exhibits normal muscle tone. Coordination normal.  Skin: Skin is warm and dry. No rash noted. She is not diaphoretic. No erythema. No pallor.  Psychiatric: She has a normal mood and affect. Her behavior is normal. Judgment and thought content normal.     Lab Results  Component Value Date   WBC  6.6 05/21/2012   HGB 13.7 05/21/2012   HCT 41.7 05/21/2012   PLT 203.0 05/21/2012   GLUCOSE 128* 05/21/2012   CHOL 143 05/21/2012   TRIG 168.0*  05/21/2012   HDL 38.30* 05/21/2012   LDLCALC 71 05/21/2012   ALT 33 05/21/2012   AST 32 05/21/2012   NA 142 05/21/2012   K 4.1 05/21/2012   CL 106 05/21/2012   CREATININE 2.1* 05/21/2012   BUN 34* 05/21/2012   CO2 30 05/21/2012   TSH 2.26 05/21/2012   HGBA1C 6.2 05/21/2012       Assessment & Plan:

## 2012-11-20 NOTE — Assessment & Plan Note (Signed)
She is due for an A1C I will treat if it is much > 7.0

## 2012-11-20 NOTE — Assessment & Plan Note (Signed)
I will recheck her renal function today. °

## 2012-11-20 NOTE — Assessment & Plan Note (Signed)
Her BP is well controlled Today I will check her lytes and renal function 

## 2012-11-20 NOTE — Assessment & Plan Note (Signed)
I will check a plain film of her T-spine to see if there is a vertebral fracture She will continue the current meds for pain

## 2012-11-20 NOTE — Patient Instructions (Signed)
Back Pain, Adult  Low back pain is very common. About 1 in 5 people have back pain. The cause of low back pain is rarely dangerous. The pain often gets better over time. About half of people with a sudden onset of back pain feel better in just 2 weeks. About 8 in 10 people feel better by 6 weeks.   CAUSES  Some common causes of back pain include:  · Strain of the muscles or ligaments supporting the spine.  · Wear and tear (degeneration) of the spinal discs.  · Arthritis.  · Direct injury to the back.  DIAGNOSIS  Most of the time, the direct cause of low back pain is not known. However, back pain can be treated effectively even when the exact cause of the pain is unknown. Answering your caregiver's questions about your overall health and symptoms is one of the most accurate ways to make sure the cause of your pain is not dangerous. If your caregiver needs more information, he or she may order lab work or imaging tests (X-rays or MRIs). However, even if imaging tests show changes in your back, this usually does not require surgery.  HOME CARE INSTRUCTIONS  For many people, back pain returns. Since low back pain is rarely dangerous, it is often a condition that people can learn to manage on their own.   · Remain active. It is stressful on the back to sit or stand in one place. Do not sit, drive, or stand in one place for more than 30 minutes at a time. Take short walks on level surfaces as soon as pain allows. Try to increase the length of time you walk each day.  · Do not stay in bed. Resting more than 1 or 2 days can delay your recovery.  · Do not avoid exercise or work. Your body is made to move. It is not dangerous to be active, even though your back may hurt. Your back will likely heal faster if you return to being active before your pain is gone.  · Pay attention to your body when you  bend and lift. Many people have less discomfort when lifting if they bend their knees, keep the load close to their bodies, and  avoid twisting. Often, the most comfortable positions are those that put less stress on your recovering back.  · Find a comfortable position to sleep. Use a firm mattress and lie on your side with your knees slightly bent. If you lie on your back, put a pillow under your knees.  · Only take over-the-counter or prescription medicines as directed by your caregiver. Over-the-counter medicines to reduce pain and inflammation are often the most helpful. Your caregiver may prescribe muscle relaxant drugs. These medicines help dull your pain so you can more quickly return to your normal activities and healthy exercise.  · Put ice on the injured area.  · Put ice in a plastic bag.  · Place a towel between your skin and the bag.  · Leave the ice on for 15-20 minutes, 3-4 times a day for the first 2 to 3 days. After that, ice and heat may be alternated to reduce pain and spasms.  · Ask your caregiver about trying back exercises and gentle massage. This may be of some benefit.  · Avoid feeling anxious or stressed. Stress increases muscle tension and can worsen back pain. It is important to recognize when you are anxious or stressed and learn ways to manage it. Exercise is a great option.  SEEK MEDICAL CARE IF:  · You have pain that is not relieved with rest or   medicine.  · You have pain that does not improve in 1 week.  · You have new symptoms.  · You are generally not feeling well.  SEEK IMMEDIATE MEDICAL CARE IF:   · You have pain that radiates from your back into your legs.  · You develop new bowel or bladder control problems.  · You have unusual weakness or numbness in your arms or legs.  · You develop nausea or vomiting.  · You develop abdominal pain.  · You feel faint.  Document Released: 02/28/2005 Document Revised: 08/30/2011 Document Reviewed: 07/19/2010  Exit[employer]® Patient Information ©2014 [employer].

## 2012-12-31 ENCOUNTER — Other Ambulatory Visit: Payer: Self-pay | Admitting: Internal Medicine

## 2013-02-21 ENCOUNTER — Other Ambulatory Visit: Payer: Self-pay | Admitting: Internal Medicine

## 2013-03-22 ENCOUNTER — Encounter: Payer: Self-pay | Admitting: Internal Medicine

## 2013-03-22 ENCOUNTER — Ambulatory Visit (INDEPENDENT_AMBULATORY_CARE_PROVIDER_SITE_OTHER): Payer: Medicare Other | Admitting: Internal Medicine

## 2013-03-22 ENCOUNTER — Other Ambulatory Visit (INDEPENDENT_AMBULATORY_CARE_PROVIDER_SITE_OTHER): Payer: Medicare Other

## 2013-03-22 VITALS — BP 136/90 | HR 63 | Temp 98.2°F | Resp 16 | Ht 66.0 in | Wt 211.0 lb

## 2013-03-22 DIAGNOSIS — B9689 Other specified bacterial agents as the cause of diseases classified elsewhere: Secondary | ICD-10-CM | POA: Insufficient documentation

## 2013-03-22 DIAGNOSIS — N183 Chronic kidney disease, stage 3 unspecified: Secondary | ICD-10-CM

## 2013-03-22 DIAGNOSIS — I10 Essential (primary) hypertension: Secondary | ICD-10-CM

## 2013-03-22 DIAGNOSIS — E1129 Type 2 diabetes mellitus with other diabetic kidney complication: Secondary | ICD-10-CM

## 2013-03-22 DIAGNOSIS — J019 Acute sinusitis, unspecified: Secondary | ICD-10-CM

## 2013-03-22 DIAGNOSIS — E1165 Type 2 diabetes mellitus with hyperglycemia: Principal | ICD-10-CM

## 2013-03-22 DIAGNOSIS — E669 Obesity, unspecified: Secondary | ICD-10-CM

## 2013-03-22 HISTORY — DX: Other specified bacterial agents as the cause of diseases classified elsewhere: B96.89

## 2013-03-22 HISTORY — DX: Morbid (severe) obesity due to excess calories: E66.01

## 2013-03-22 LAB — BASIC METABOLIC PANEL
BUN: 36 mg/dL — AB (ref 6–23)
CHLORIDE: 107 meq/L (ref 96–112)
CO2: 29 mEq/L (ref 19–32)
CREATININE: 2.5 mg/dL — AB (ref 0.4–1.2)
Calcium: 9.3 mg/dL (ref 8.4–10.5)
GFR: 19.51 mL/min — ABNORMAL LOW (ref >60.00)
GLUCOSE: 123 mg/dL — AB (ref 70–99)
Potassium: 3.4 mEq/L — ABNORMAL LOW (ref 3.5–5.1)
Sodium: 144 mEq/L (ref 135–145)

## 2013-03-22 LAB — HEMOGLOBIN A1C: HEMOGLOBIN A1C: 6.4 % (ref 4.6–6.5)

## 2013-03-22 MED ORDER — CEFUROXIME AXETIL 500 MG PO TABS: 500.0000 mg | ORAL_TABLET | Freq: Two times a day (BID) | ORAL | Status: DC

## 2013-03-22 NOTE — Assessment & Plan Note (Signed)
I will treat the infection with ceftin 

## 2013-03-22 NOTE — Patient Instructions (Signed)

## 2013-03-22 NOTE — Assessment & Plan Note (Signed)
She is working on her lifestyle modifications to lose weight. 

## 2013-03-22 NOTE — Progress Notes (Signed)
Subjective:    Patient ID: Danielle Frazier, female    DOB: 1930/05/11, 80 y.o.   MRN: [id number]  Sinusitis This is a new problem. The current episode started 1 to 4 weeks ago. The problem has been gradually worsening since onset. There has been no fever. The fever has been present for less than 1 day. Her pain is at a severity of 0/10. She is experiencing no pain. Associated symptoms include chills, sinus pressure and a sore throat. Pertinent negatives include no congestion, coughing, diaphoresis, ear pain, headaches, hoarse voice, neck pain, shortness of breath, sneezing or swollen glands. Past treatments include oral decongestants. The treatment provided mild relief.      Review of Systems  Constitutional: Positive for chills. Negative for fever, diaphoresis, activity change, appetite change, fatigue and unexpected weight change.  HENT: Positive for postnasal drip, rhinorrhea, sinus pressure and sore throat. Negative for congestion, ear pain, hoarse voice, mouth sores, nosebleeds, sneezing and tinnitus.   Eyes: Negative.   Respiratory: Negative.  Negative for cough, choking, chest tightness, shortness of breath, wheezing and stridor.   Cardiovascular: Negative.  Negative for chest pain, palpitations and leg swelling.  Gastrointestinal: Negative.  Negative for nausea, abdominal pain, diarrhea, constipation and blood in stool.  Endocrine: Negative.  Negative for polydipsia, polyphagia and polyuria.  Genitourinary: Negative.  Negative for dysuria, urgency, frequency, hematuria, flank pain, enuresis, difficulty urinating and pelvic pain.  Musculoskeletal: Negative.  Negative for neck pain.  Skin: Negative.   Allergic/Immunologic: Negative.   Neurological: Negative.  Negative for dizziness and headaches.  Hematological: Negative.  Negative for adenopathy. Does not bruise/bleed easily.  Psychiatric/Behavioral: Negative.        Objective:   Physical Exam  Vitals reviewed. Constitutional:  She is oriented to person, place, and time. She appears well-developed and well-nourished.  Non-toxic appearance. She does not have a sickly appearance. She does not appear ill. No distress.  HENT:  Right Ear: Hearing, tympanic membrane, external ear and ear canal normal.  Left Ear: Hearing, tympanic membrane, external ear and ear canal normal.  Nose: No mucosal edema or rhinorrhea. Right sinus exhibits maxillary sinus tenderness. Right sinus exhibits no frontal sinus tenderness. Left sinus exhibits maxillary sinus tenderness. Left sinus exhibits no frontal sinus tenderness.  Mouth/Throat: Oropharynx is clear and moist and mucous membranes are normal. Mucous membranes are not pale, not dry and not cyanotic. No trismus in the jaw. No uvula swelling. No oropharyngeal exudate, posterior oropharyngeal edema, posterior oropharyngeal erythema or tonsillar abscesses.  Eyes: Conjunctivae are normal. Right eye exhibits no discharge. Left eye exhibits no discharge. No scleral icterus.  Neck: Normal range of motion. Neck supple. No JVD present. No tracheal deviation present. No thyromegaly present.  Cardiovascular: Normal rate, regular rhythm, normal heart sounds and intact distal pulses.  Exam reveals no gallop and no friction rub.   No murmur heard. Pulmonary/Chest: Effort normal and breath sounds normal. No stridor. No respiratory distress. She has no wheezes. She has no rales. She exhibits no tenderness.  Abdominal: Soft. Bowel sounds are normal. She exhibits no distension and no mass. There is no tenderness. There is no rebound and no guarding.  Musculoskeletal: Normal range of motion. She exhibits no edema and no tenderness.  Lymphadenopathy:    She has no cervical adenopathy.  Neurological: She is oriented to person, place, and time.  Skin: Skin is warm and dry. No rash noted. She is not diaphoretic. No erythema. No pallor.  Psychiatric: She has a normal mood  and affect. Her behavior is normal. Judgment  and thought content normal.     Lab Results  Component Value Date   WBC 8.1 11/20/2012   HGB 14.1 11/20/2012   HCT 43.1 11/20/2012   PLT 218.0 11/20/2012   GLUCOSE 140* 11/20/2012   CHOL 143 05/21/2012   TRIG 168.0* 05/21/2012   HDL 38.30* 05/21/2012   LDLCALC 71 05/21/2012   ALT 33 05/21/2012   AST 32 05/21/2012   NA 143 11/20/2012   K 3.8 11/20/2012   CL 106 11/20/2012   CREATININE 2.2* 11/20/2012   BUN 32* 11/20/2012   CO2 30 11/20/2012   TSH 2.26 05/21/2012   HGBA1C 6.3 11/20/2012       Assessment & Plan:

## 2013-03-22 NOTE — Assessment & Plan Note (Signed)
I will monitor her renal function today 

## 2013-03-22 NOTE — Assessment & Plan Note (Signed)
-   Repeat A1C today

## 2013-03-22 NOTE — Assessment & Plan Note (Signed)
Her BP is adequately well controlled 

## 2013-03-22 NOTE — Progress Notes (Signed)
Pre visit review using our clinic review tool, if applicable. No additional management support is needed unless otherwise documented below in the visit note. 

## 2013-04-24 ENCOUNTER — Ambulatory Visit (INDEPENDENT_AMBULATORY_CARE_PROVIDER_SITE_OTHER)
Admission: RE | Admit: 2013-04-24 | Discharge: 2013-04-24 | Disposition: A | Discharge status: Home / Self Care | Payer: Medicare Other | Source: Ambulatory Visit | Attending: Internal Medicine | Admitting: Internal Medicine

## 2013-04-24 ENCOUNTER — Ambulatory Visit (INDEPENDENT_AMBULATORY_CARE_PROVIDER_SITE_OTHER): Payer: Medicare Other | Admitting: Internal Medicine

## 2013-04-24 ENCOUNTER — Encounter: Payer: Self-pay | Admitting: Internal Medicine

## 2013-04-24 ENCOUNTER — Other Ambulatory Visit (INDEPENDENT_AMBULATORY_CARE_PROVIDER_SITE_OTHER): Payer: Medicare Other

## 2013-04-24 VITALS — BP 130/82 | HR 56 | Temp 98.5°F | Resp 16 | Wt 210.0 lb

## 2013-04-24 DIAGNOSIS — K5900 Constipation, unspecified: Secondary | ICD-10-CM

## 2013-04-24 DIAGNOSIS — N390 Urinary tract infection, site not specified: Secondary | ICD-10-CM | POA: Insufficient documentation

## 2013-04-24 DIAGNOSIS — Q619 Cystic kidney disease, unspecified: Secondary | ICD-10-CM

## 2013-04-24 DIAGNOSIS — R10813 Right lower quadrant abdominal tenderness: Secondary | ICD-10-CM

## 2013-04-24 DIAGNOSIS — K5909 Other constipation: Secondary | ICD-10-CM

## 2013-04-24 DIAGNOSIS — N281 Cyst of kidney, acquired: Secondary | ICD-10-CM

## 2013-04-24 HISTORY — DX: Right lower quadrant abdominal tenderness: R10.813

## 2013-04-24 HISTORY — DX: Urinary tract infection, site not specified: N39.0

## 2013-04-24 HISTORY — DX: Other constipation: K59.09

## 2013-04-24 LAB — COMPREHENSIVE METABOLIC PANEL
ALBUMIN: 3.8 g/dL (ref 3.5–5.2)
ALK PHOS: 94 U/L (ref 39–117)
ALT: 41 U/L — ABNORMAL HIGH (ref 0–35)
AST: 43 U/L — ABNORMAL HIGH (ref 0–37)
BUN: 35 mg/dL — AB (ref 6–23)
CALCIUM: 9.8 mg/dL (ref 8.4–10.5)
CO2: 27 meq/L (ref 19–32)
Chloride: 107 mEq/L (ref 96–112)
Creatinine, Ser: 2.4 mg/dL — ABNORMAL HIGH (ref 0.4–1.2)
GFR: 21.15 mL/min — AB (ref >60.00)
GLUCOSE: 143 mg/dL — AB (ref 70–99)
POTASSIUM: 3.6 meq/L (ref 3.5–5.1)
Sodium: 143 mEq/L (ref 135–145)
Total Bilirubin: 0.8 mg/dL (ref 0.3–1.2)
Total Protein: 7.3 g/dL (ref 6.0–8.3)

## 2013-04-24 LAB — CBC WITH DIFFERENTIAL/PLATELET
BASOS ABS: 0 10*3/uL (ref 0.0–0.1)
Basophils Relative: 0.5 % (ref 0.0–3.0)
EOS ABS: 0.2 10*3/uL (ref 0.0–0.7)
Eosinophils Relative: 1.8 % (ref 0.0–5.0)
HCT: 42.6 % (ref 36.0–46.0)
Hemoglobin: 13.5 g/dL (ref 12.0–15.0)
LYMPHS ABS: 1.5 10*3/uL (ref 0.7–4.0)
Lymphocytes Relative: 17.3 % (ref 12.0–46.0)
MCHC: 31.8 g/dL (ref 30.0–36.0)
MCV: 99.4 fl (ref 78.0–100.0)
MONO ABS: 0.4 10*3/uL (ref 0.1–1.0)
Monocytes Relative: 4.1 % (ref 3.0–12.0)
NEUTROS ABS: 6.6 10*3/uL (ref 1.4–7.7)
Neutrophils Relative %: 76.3 % (ref 43.0–77.0)
Platelets: 268 10*3/uL (ref 150.0–400.0)
RBC: 4.28 Mil/uL (ref 3.87–5.11)
RDW: 13.7 % (ref 11.5–14.6)
WBC: 8.7 10*3/uL (ref 4.5–10.5)

## 2013-04-24 LAB — URINALYSIS, ROUTINE W REFLEX MICROSCOPIC
BILIRUBIN URINE: NEGATIVE
KETONES UR: NEGATIVE
Nitrite: NEGATIVE
Specific Gravity, Urine: >=1.03 — AB (ref 1.000–1.030)
Total Protein, Urine: >=300 — AB
URINE GLUCOSE: NEGATIVE
UROBILINOGEN UA: 0.2 (ref 0.0–1.0)
pH: 5.5 (ref 5.0–8.0)

## 2013-04-24 LAB — LIPASE: Lipase: 59 U/L (ref 11.0–59.0)

## 2013-04-24 LAB — AMYLASE: Amylase: 131 U/L (ref 27–131)

## 2013-04-24 LAB — TSH: TSH: 4.54 u[IU]/mL (ref 0.35–5.50)

## 2013-04-24 MED ORDER — CIPROFLOXACIN HCL 250 MG PO TABS: 250.0000 mg | ORAL_TABLET | Freq: Two times a day (BID) | ORAL | Status: DC

## 2013-04-24 MED ORDER — LINACLOTIDE 145 MCG PO CAPS: 145.0000 ug | ORAL_CAPSULE | Freq: Every day | ORAL | Status: DC

## 2013-04-24 NOTE — Progress Notes (Signed)
Subjective:    Patient ID: Danielle Frazier, female    DOB: 07-27-1930, 80 y.o.   MRN: [id number]  Abdominal Pain This is a recurrent problem. The current episode started 1 to 4 weeks ago. The onset quality is gradual. The problem occurs intermittently. The problem has been unchanged. The pain is located in the RLQ. The pain is at a severity of 2/10. The pain is mild. The quality of the pain is aching. The abdominal pain does not radiate. Associated symptoms include constipation, dysuria and frequency. Pertinent negatives include no anorexia, arthralgias, belching, diarrhea, fever, flatus, headaches, hematochezia, hematuria, melena, myalgias, nausea, vomiting or weight loss. Nothing aggravates the pain. The pain is relieved by nothing. She has tried nothing for the symptoms. There is no history of abdominal surgery, Crohn's disease, gallstones, GERD, irritable bowel syndrome, pancreatitis, PUD or ulcerative colitis.      Review of Systems  Constitutional: Negative.  Negative for fever and weight loss.  HENT: Negative.   Eyes: Negative.   Cardiovascular: Negative for chest pain, palpitations and leg swelling.  Gastrointestinal: Positive for abdominal pain and constipation. Negative for nausea, vomiting, diarrhea, blood in stool, melena, hematochezia, abdominal distention, anal bleeding, rectal pain, anorexia and flatus.  Endocrine: Negative.   Genitourinary: Positive for dysuria, frequency and pelvic pain. Negative for urgency, hematuria, flank pain, decreased urine volume, vaginal bleeding, vaginal discharge, enuresis, difficulty urinating, vaginal pain, menstrual problem and dyspareunia.  Musculoskeletal: Negative.  Negative for arthralgias and myalgias.  Skin: Negative.   Allergic/Immunologic: Negative.   Neurological: Negative.  Negative for headaches.  Hematological: Negative.  Negative for adenopathy. Does not bruise/bleed easily.  Psychiatric/Behavioral: Negative.        Objective:     Physical Exam  Vitals reviewed. Constitutional: She is oriented to person, place, and time. She appears well-developed and well-nourished.  Non-toxic appearance. She does not have a sickly appearance. She does not appear ill. No distress.  HENT:  Head: Normocephalic and atraumatic.  Mouth/Throat: Oropharynx is clear and moist. No oropharyngeal exudate.  Eyes: Conjunctivae are normal. Right eye exhibits no discharge. Left eye exhibits no discharge. No scleral icterus.  Neck: Normal range of motion. Neck supple. No JVD present. No tracheal deviation present. No thyromegaly present.  Cardiovascular: Normal rate, regular rhythm, normal heart sounds and intact distal pulses.  Exam reveals no gallop and no friction rub.   No murmur heard. Pulmonary/Chest: Effort normal and breath sounds normal. No stridor. No respiratory distress. She has no wheezes. She has no rales. She exhibits no tenderness.  Abdominal: Soft. She exhibits no distension, no abdominal bruit, no pulsatile midline mass and no mass. Bowel sounds are decreased. There is no hepatosplenomegaly, splenomegaly or hepatomegaly. There is tenderness in the right lower quadrant and suprapubic area. There is no rigidity, no rebound, no guarding, no CVA tenderness, no tenderness at McBurney's point and negative Murphy's sign. No hernia. Hernia confirmed negative in the ventral area, confirmed negative in the right inguinal area and confirmed negative in the left inguinal area.  Musculoskeletal: Normal range of motion. She exhibits no edema and no tenderness.  Lymphadenopathy:    She has no cervical adenopathy.  Neurological: She is oriented to person, place, and time.  Skin: Skin is warm and dry. No rash noted. She is not diaphoretic. No erythema. No pallor.  Psychiatric: She has a normal mood and affect. Her behavior is normal. Judgment and thought content normal.     Lab Results  Component Value Date  WBC 8.1 11/20/2012   HGB 14.1 11/20/2012    HCT 43.1 11/20/2012   PLT 218.0 11/20/2012   GLUCOSE 123* 03/22/2013   CHOL 143 05/21/2012   TRIG 168.0* 05/21/2012   HDL 38.30* 05/21/2012   LDLCALC 71 05/21/2012   ALT 33 05/21/2012   AST 32 05/21/2012   NA 144 03/22/2013   K 3.4* 03/22/2013   CL 107 03/22/2013   CREATININE 2.5* 03/22/2013   BUN 36* 03/22/2013   CO2 29 03/22/2013   TSH 2.26 05/21/2012   HGBA1C 6.4 03/22/2013       Assessment & Plan:

## 2013-04-24 NOTE — Assessment & Plan Note (Signed)
On the UA there appears to be evidence of a UTI, will treat with cipro

## 2013-04-24 NOTE — Patient Instructions (Signed)
Abdominal Pain, Adult °Many things can cause abdominal pain. Usually, abdominal pain is not caused by a disease and will improve without treatment. It can often be observed and treated at home. Your health care provider will do a physical exam and possibly order blood tests and X-rays to help determine the seriousness of your pain. However, in many cases, more time must pass before a clear cause of the pain can be found. Before that point, your health care provider may not know if you need more testing or further treatment. °HOME CARE INSTRUCTIONS  °Monitor your abdominal pain for any changes. The following actions may help to alleviate any discomfort you are experiencing: °· Only take over-the-counter or prescription medicines as directed by your health care provider. °· Do not take laxatives unless directed to do so by your health care provider. °· Try a clear liquid diet (broth, tea, or water) as directed by your health care provider. Slowly move to a bland diet as tolerated. °SEEK MEDICAL CARE IF: °· You have unexplained abdominal pain. °· You have abdominal pain associated with nausea or diarrhea. °· You have pain when you urinate or have a bowel movement. °· You experience abdominal pain that wakes you in the night. °· You have abdominal pain that is worsened or improved by eating food. °· You have abdominal pain that is worsened with eating fatty foods. °SEEK IMMEDIATE MEDICAL CARE IF:  °· Your pain does not go away within 2 hours. °· You have a fever. °· You keep throwing up (vomiting). °· Your pain is felt only in portions of the abdomen, such as the right side or the left lower portion of the abdomen. °· You pass bloody or black tarry stools. °MAKE SURE YOU: °· Understand these instructions.   °· Will watch your condition.   °· Will get help right away if you are not doing well or get worse.   °Document Released: 12/08/2004 Document Revised: 12/19/2012 Document Reviewed: 11/07/2012 °[employer]® Patient  Information ©2014 [employer]. ° °

## 2013-04-24 NOTE — Assessment & Plan Note (Signed)
I will check her labs to look for organic causes of pain and have asked her to get an abd/pelvic U/S done to look for mass, ovarian cyst, etc

## 2013-04-24 NOTE — Assessment & Plan Note (Signed)
Start cipro and recheck the renal u/s

## 2013-04-24 NOTE — Assessment & Plan Note (Signed)
Plain film is normal Labs show no evidence of secondary causes of constipation She will try linzess for symptom relief

## 2013-04-24 NOTE — Progress Notes (Signed)
Pre visit review using our clinic review tool, if applicable. No additional management support is needed unless otherwise documented below in the visit note. 

## 2013-04-29 ENCOUNTER — Ambulatory Visit
Admission: RE | Admit: 2013-04-29 | Discharge: 2013-04-29 | Disposition: A | Discharge status: Home / Self Care | Payer: Medicare Other | Source: Ambulatory Visit | Attending: Internal Medicine | Admitting: Internal Medicine

## 2013-04-29 DIAGNOSIS — K5909 Other constipation: Secondary | ICD-10-CM

## 2013-04-29 DIAGNOSIS — R10813 Right lower quadrant abdominal tenderness: Secondary | ICD-10-CM

## 2013-05-16 ENCOUNTER — Ambulatory Visit (INDEPENDENT_AMBULATORY_CARE_PROVIDER_SITE_OTHER): Payer: Medicare Other | Admitting: Internal Medicine

## 2013-05-16 ENCOUNTER — Encounter: Payer: Self-pay | Admitting: Internal Medicine

## 2013-05-16 VITALS — BP 130/88 | HR 59 | Temp 98.5°F | Resp 16 | Ht 66.0 in | Wt 211.5 lb

## 2013-05-16 DIAGNOSIS — D259 Leiomyoma of uterus, unspecified: Secondary | ICD-10-CM

## 2013-05-16 DIAGNOSIS — Q619 Cystic kidney disease, unspecified: Secondary | ICD-10-CM

## 2013-05-16 DIAGNOSIS — I10 Essential (primary) hypertension: Secondary | ICD-10-CM

## 2013-05-16 DIAGNOSIS — N281 Cyst of kidney, acquired: Secondary | ICD-10-CM

## 2013-05-16 DIAGNOSIS — R10813 Right lower quadrant abdominal tenderness: Secondary | ICD-10-CM

## 2013-05-16 HISTORY — DX: Leiomyoma of uterus, unspecified: D25.9

## 2013-05-16 NOTE — Assessment & Plan Note (Signed)
Her BP is well controlled 

## 2013-05-16 NOTE — Assessment & Plan Note (Signed)
Her pain is resolving GYN referral to see if the fibroid is causing the pain

## 2013-05-16 NOTE — Patient Instructions (Signed)
Abdominal Pain, Adult °Many things can cause abdominal pain. Usually, abdominal pain is not caused by a disease and will improve without treatment. It can often be observed and treated at home. Your health care provider will do a physical exam and possibly order blood tests and X-rays to help determine the seriousness of your pain. However, in many cases, more time must pass before a clear cause of the pain can be found. Before that point, your health care provider may not know if you need more testing or further treatment. °HOME CARE INSTRUCTIONS  °Monitor your abdominal pain for any changes. The following actions may help to alleviate any discomfort you are experiencing: °· Only take over-the-counter or prescription medicines as directed by your health care provider. °· Do not take laxatives unless directed to do so by your health care provider. °· Try a clear liquid diet (broth, tea, or water) as directed by your health care provider. Slowly move to a bland diet as tolerated. °SEEK MEDICAL CARE IF: °· You have unexplained abdominal pain. °· You have abdominal pain associated with nausea or diarrhea. °· You have pain when you urinate or have a bowel movement. °· You experience abdominal pain that wakes you in the night. °· You have abdominal pain that is worsened or improved by eating food. °· You have abdominal pain that is worsened with eating fatty foods. °SEEK IMMEDIATE MEDICAL CARE IF:  °· Your pain does not go away within 2 hours. °· You have a fever. °· You keep throwing up (vomiting). °· Your pain is felt only in portions of the abdomen, such as the right side or the left lower portion of the abdomen. °· You pass bloody or black tarry stools. °MAKE SURE YOU: °· Understand these instructions.   °· Will watch your condition.   °· Will get help right away if you are not doing well or get worse.   °Document Released: 12/08/2004 Document Revised: 12/19/2012 Document Reviewed: 11/07/2012 °[employer]® Patient  Information ©2014 [employer]. ° °

## 2013-05-16 NOTE — Assessment & Plan Note (Signed)
I have asked her to see GYN to see if this needs further investigation

## 2013-05-16 NOTE — Assessment & Plan Note (Signed)
These were stable on the recent U/S

## 2013-05-16 NOTE — Progress Notes (Signed)
Subjective:    Patient ID: Danielle Frazier, female    DOB: 22-Mar-1930, 80 y.o.   MRN: [id number]  Abdominal Pain This is a recurrent problem. The onset quality is gradual. The problem occurs intermittently. The problem has been gradually improving. The pain is located in the RLQ. The pain is at a severity of 1/10. The pain is mild. The quality of the pain is aching. The abdominal pain radiates to the RLQ. Pertinent negatives include no anorexia, arthralgias, belching, constipation, diarrhea, dysuria, fever, flatus, frequency, headaches, hematochezia, hematuria, melena, myalgias, nausea, vomiting or weight loss.      Review of Systems  Constitutional: Negative.  Negative for fever, chills, weight loss, diaphoresis, activity change and fatigue.  HENT: Negative.   Eyes: Negative.   Respiratory: Negative.  Negative for apnea, cough, choking, chest tightness, shortness of breath, wheezing and stridor.   Cardiovascular: Negative.  Negative for chest pain, palpitations and leg swelling.  Gastrointestinal: Positive for abdominal pain. Negative for nausea, vomiting, diarrhea, constipation, melena, hematochezia, anorexia and flatus.  Endocrine: Negative.   Genitourinary: Negative.  Negative for dysuria, urgency, frequency, hematuria, flank pain, decreased urine volume and difficulty urinating.  Musculoskeletal: Negative.  Negative for arthralgias and myalgias.  Skin: Negative.   Allergic/Immunologic: Negative.   Neurological: Negative.  Negative for headaches.  Hematological: Negative.  Negative for adenopathy. Does not bruise/bleed easily.  Psychiatric/Behavioral: Negative.        Objective:   Physical Exam  Vitals reviewed. Constitutional: She is oriented to person, place, and time. She appears well-developed and well-nourished. No distress.  HENT:  Head: Normocephalic and atraumatic.  Mouth/Throat: Oropharynx is clear and moist. No oropharyngeal exudate.  Eyes: Conjunctivae are normal.  Right eye exhibits no discharge. Left eye exhibits no discharge. No scleral icterus.  Neck: Normal range of motion. Neck supple. No JVD present. No tracheal deviation present. No thyromegaly present.  Cardiovascular: Normal rate, regular rhythm, normal heart sounds and intact distal pulses.  Exam reveals no gallop and no friction rub.   No murmur heard. Pulmonary/Chest: Effort normal and breath sounds normal. No stridor. No respiratory distress. She has no wheezes. She has no rales. She exhibits no tenderness.  Abdominal: Soft. Normal appearance and bowel sounds are normal. She exhibits no shifting dullness, no distension, no pulsatile liver, no fluid wave, no abdominal bruit, no ascites, no pulsatile midline mass and no mass. There is no hepatosplenomegaly, splenomegaly or hepatomegaly. There is no tenderness. There is no rigidity, no rebound, no guarding, no CVA tenderness, no tenderness at McBurney's point and negative Murphy's sign. No hernia. Hernia confirmed negative in the ventral area, confirmed negative in the right inguinal area and confirmed negative in the left inguinal area.  Musculoskeletal: Normal range of motion. She exhibits no edema and no tenderness.  Lymphadenopathy:    She has no cervical adenopathy.  Neurological: She is oriented to person, place, and time.  Skin: Skin is warm and dry. No rash noted. She is not diaphoretic. No erythema. No pallor.    Lab Results  Component Value Date   WBC 8.7 04/24/2013   HGB 13.5 04/24/2013   HCT 42.6 04/24/2013   PLT 268.0 04/24/2013   GLUCOSE 143* 04/24/2013   CHOL 143 05/21/2012   TRIG 168.0* 05/21/2012   HDL 38.30* 05/21/2012   LDLCALC 71 05/21/2012   ALT 41* 04/24/2013   AST 43* 04/24/2013   NA 143 04/24/2013   K 3.6 04/24/2013   CL 107 04/24/2013   CREATININE 2.4* 04/24/2013  BUN 35* 04/24/2013   CO2 27 04/24/2013   TSH 4.54 04/24/2013   HGBA1C 6.4 03/22/2013        Assessment & Plan:

## 2013-05-16 NOTE — Progress Notes (Signed)
Pre visit review using our clinic review tool, if applicable. No additional management support is needed unless otherwise documented below in the visit note. 

## 2013-06-07 ENCOUNTER — Encounter: Payer: Self-pay | Admitting: Internal Medicine

## 2013-07-04 ENCOUNTER — Ambulatory Visit (INDEPENDENT_AMBULATORY_CARE_PROVIDER_SITE_OTHER): Payer: Medicare Other | Admitting: Obstetrics & Gynecology

## 2013-07-04 ENCOUNTER — Encounter: Payer: Self-pay | Admitting: Obstetrics & Gynecology

## 2013-07-04 ENCOUNTER — Other Ambulatory Visit (HOSPITAL_COMMUNITY)
Admission: RE | Admit: 2013-07-04 | Discharge: 2013-07-04 | Disposition: A | Discharge status: Home / Self Care | Payer: Medicare Other | Source: Ambulatory Visit | Attending: Obstetrics & Gynecology | Admitting: Obstetrics & Gynecology

## 2013-07-04 VITALS — BP 176/86 | HR 82 | Temp 98.2°F | Ht 66.0 in | Wt 207.4 lb

## 2013-07-04 DIAGNOSIS — N841 Polyp of cervix uteri: Secondary | ICD-10-CM | POA: Insufficient documentation

## 2013-07-04 DIAGNOSIS — D219 Benign neoplasm of connective and other soft tissue, unspecified: Secondary | ICD-10-CM

## 2013-07-04 DIAGNOSIS — D259 Leiomyoma of uterus, unspecified: Secondary | ICD-10-CM

## 2013-07-04 NOTE — Progress Notes (Signed)
Patient reports that she has pelvic pain intermittently for the past few months. She had an ultrasound that showed a fibroid.

## 2013-07-04 NOTE — Progress Notes (Signed)
   Subjective:    Patient ID: Danielle Frazier, female    DOB: July 02, 1930, 80 y.o.   MRN: [id number]  HPI  This lovely 80 yo healthy lady is here today because of a 6 month h/o a pain in her right groin, RLQ that radiates down her anterior thigh. It previously used to radiate down to her toe. On a pain scale, she rates this pain as a 1 out of 10. She is not sexually active now because her husband had prostate cancer. An u/s showed a 1.5 cm fibroid in the posterior wall.  Review of Systems     Objective:   Physical Exam  Speculum exam reveals a small polyp. I removed it easily and reassured her that this is most likely benign. I did send it to pathology.      Assessment & Plan:  Pain as described above. I am certain that this is not from her 1.5 cm post menopausal fibroid. I strongly suspect disc disease. I have recommended a chiropractor.

## 2013-07-06 ENCOUNTER — Other Ambulatory Visit: Payer: Self-pay | Admitting: Internal Medicine

## 2013-08-20 ENCOUNTER — Other Ambulatory Visit (INDEPENDENT_AMBULATORY_CARE_PROVIDER_SITE_OTHER): Payer: Medicare Other

## 2013-08-20 ENCOUNTER — Ambulatory Visit (INDEPENDENT_AMBULATORY_CARE_PROVIDER_SITE_OTHER): Payer: Medicare Other | Admitting: Internal Medicine

## 2013-08-20 ENCOUNTER — Encounter: Payer: Self-pay | Admitting: Internal Medicine

## 2013-08-20 VITALS — BP 138/88 | HR 54 | Temp 97.8°F | Resp 16 | Ht 66.0 in | Wt 209.0 lb

## 2013-08-20 DIAGNOSIS — N183 Chronic kidney disease, stage 3 unspecified: Secondary | ICD-10-CM

## 2013-08-20 DIAGNOSIS — R32 Unspecified urinary incontinence: Secondary | ICD-10-CM

## 2013-08-20 DIAGNOSIS — E1165 Type 2 diabetes mellitus with hyperglycemia: Principal | ICD-10-CM

## 2013-08-20 DIAGNOSIS — N2889 Other specified disorders of kidney and ureter: Secondary | ICD-10-CM

## 2013-08-20 DIAGNOSIS — I10 Essential (primary) hypertension: Secondary | ICD-10-CM

## 2013-08-20 DIAGNOSIS — E1129 Type 2 diabetes mellitus with other diabetic kidney complication: Secondary | ICD-10-CM

## 2013-08-20 DIAGNOSIS — E785 Hyperlipidemia, unspecified: Secondary | ICD-10-CM

## 2013-08-20 DIAGNOSIS — Z23 Encounter for immunization: Secondary | ICD-10-CM

## 2013-08-20 LAB — URINALYSIS, ROUTINE W REFLEX MICROSCOPIC
Bilirubin Urine: NEGATIVE
KETONES UR: NEGATIVE
LEUKOCYTES UA: NEGATIVE
Nitrite: NEGATIVE
Total Protein, Urine: >=300 — AB
URINE GLUCOSE: NEGATIVE
UROBILINOGEN UA: 0.2 (ref 0.0–1.0)
pH: 6 (ref 5.0–8.0)

## 2013-08-20 LAB — CBC WITH DIFFERENTIAL/PLATELET
BASOS ABS: 0 10*3/uL (ref 0.0–0.1)
Basophils Relative: 0.4 % (ref 0.0–3.0)
Eosinophils Absolute: 0.2 10*3/uL (ref 0.0–0.7)
Eosinophils Relative: 3.3 % (ref 0.0–5.0)
HEMATOCRIT: 40.7 % (ref 36.0–46.0)
Hemoglobin: 13.5 g/dL (ref 12.0–15.0)
LYMPHS ABS: 2.7 10*3/uL (ref 0.7–4.0)
Lymphocytes Relative: 35.4 % (ref 12.0–46.0)
MCHC: 33.1 g/dL (ref 30.0–36.0)
MCV: 95.5 fl (ref 78.0–100.0)
MONO ABS: 0.5 10*3/uL (ref 0.1–1.0)
Monocytes Relative: 6.7 % (ref 3.0–12.0)
NEUTROS PCT: 54.2 % (ref 43.0–77.0)
Neutro Abs: 4.1 10*3/uL (ref 1.4–7.7)
PLATELETS: 219 10*3/uL (ref 150.0–400.0)
RBC: 4.26 Mil/uL (ref 3.87–5.11)
RDW: 14.2 % (ref 11.5–15.5)
WBC: 7.6 10*3/uL (ref 4.0–10.5)

## 2013-08-20 LAB — BASIC METABOLIC PANEL
BUN: 43 mg/dL — AB (ref 6–23)
CO2: 28 mEq/L (ref 19–32)
CREATININE: 2.3 mg/dL — AB (ref 0.4–1.2)
Calcium: 9.8 mg/dL (ref 8.4–10.5)
Chloride: 108 mEq/L (ref 96–112)
GFR: 21.23 mL/min — ABNORMAL LOW (ref >60.00)
GLUCOSE: 125 mg/dL — AB (ref 70–99)
Potassium: 4 mEq/L (ref 3.5–5.1)
Sodium: 143 mEq/L (ref 135–145)

## 2013-08-20 LAB — LIPID PANEL
CHOL/HDL RATIO: 4
CHOLESTEROL: 153 mg/dL (ref 0–200)
HDL: 41.2 mg/dL (ref >39.00)
LDL Cholesterol: 84 mg/dL (ref 0–99)
NonHDL: 111.8
Triglycerides: 138 mg/dL (ref 0.0–149.0)
VLDL: 27.6 mg/dL (ref 0.0–40.0)

## 2013-08-20 LAB — TSH: TSH: 2.94 u[IU]/mL (ref 0.35–4.50)

## 2013-08-20 LAB — HEMOGLOBIN A1C: Hgb A1c MFr Bld: 6.4 % (ref 4.6–6.5)

## 2013-08-20 MED ORDER — DARIFENACIN HYDROBROMIDE ER 15 MG PO TB24: 15.0000 mg | ORAL_TABLET | Freq: Every day | ORAL | Status: DC

## 2013-08-20 NOTE — Patient Instructions (Signed)

## 2013-08-20 NOTE — Progress Notes (Signed)
Pre visit review using our clinic review tool, if applicable. No additional management support is needed unless otherwise documented below in the visit note. 

## 2013-08-20 NOTE — Progress Notes (Signed)
Subjective:    Patient ID: Danielle Frazier, female    DOB: 04-Mar-1931, 80 y.o.   MRN: [id number]  Hypertension This is a chronic problem. The current episode started more than 1 year ago. The problem has been gradually improving since onset. The problem is controlled. Pertinent negatives include no anxiety, blurred vision, chest pain, headaches, malaise/fatigue, neck pain, orthopnea, palpitations, peripheral edema, PND, shortness of breath or sweats. There are no associated agents to hypertension. Past treatments include angiotensin blockers, diuretics and beta blockers. Hypertensive end-organ damage includes kidney disease. Identifiable causes of hypertension include chronic renal disease.      Review of Systems  Constitutional: Negative.  Negative for fever, chills, malaise/fatigue, diaphoresis, appetite change and fatigue.  HENT: Negative.   Eyes: Negative.  Negative for blurred vision.  Respiratory: Negative.  Negative for apnea, cough, choking, chest tightness, shortness of breath, wheezing and stridor.   Cardiovascular: Negative.  Negative for chest pain, palpitations, orthopnea, leg swelling and PND.  Gastrointestinal: Negative.  Negative for nausea, vomiting, abdominal pain, diarrhea, constipation and blood in stool.  Endocrine: Positive for polyuria. Negative for polydipsia and polyphagia.  Genitourinary: Positive for frequency. Negative for dysuria, urgency, hematuria, flank pain, decreased urine volume, vaginal bleeding, vaginal discharge, enuresis, difficulty urinating, genital sores, vaginal pain, menstrual problem, pelvic pain and dyspareunia.  Musculoskeletal: Negative.  Negative for arthralgias, back pain, joint swelling, myalgias and neck pain.  Skin: Negative.  Negative for rash.  Allergic/Immunologic: Negative.   Neurological: Negative.  Negative for dizziness, tremors, weakness, light-headedness, numbness and headaches.  Hematological: Negative.  Negative for adenopathy.  Does not bruise/bleed easily.  Psychiatric/Behavioral: Negative.        Objective:   Physical Exam  Vitals reviewed. Constitutional: She is oriented to person, place, and time. She appears well-developed and well-nourished. No distress.  HENT:  Head: Normocephalic and atraumatic.  Mouth/Throat: Oropharynx is clear and moist. No oropharyngeal exudate.  Eyes: Conjunctivae are normal. Right eye exhibits no discharge. Left eye exhibits no discharge. No scleral icterus.  Neck: Normal range of motion. Neck supple. No JVD present. No tracheal deviation present. No thyromegaly present.  Cardiovascular: Normal rate, regular rhythm, normal heart sounds and intact distal pulses.  Exam reveals no gallop and no friction rub.   No murmur heard. Pulmonary/Chest: Effort normal and breath sounds normal. No stridor. No respiratory distress. She has no wheezes. She has no rales. She exhibits no tenderness.  Abdominal: Soft. Bowel sounds are normal. She exhibits no distension and no mass. There is no tenderness. There is no rebound and no guarding.  Musculoskeletal: Normal range of motion. She exhibits no edema and no tenderness.  Lymphadenopathy:    She has no cervical adenopathy.  Neurological: She is oriented to person, place, and time.  Skin: Skin is warm and dry. No rash noted. She is not diaphoretic. No erythema. No pallor.  Psychiatric: She has a normal mood and affect. Her behavior is normal. Judgment and thought content normal.     Lab Results  Component Value Date   WBC 8.7 04/24/2013   HGB 13.5 04/24/2013   HCT 42.6 04/24/2013   PLT 268.0 04/24/2013   GLUCOSE 143* 04/24/2013   CHOL 143 05/21/2012   TRIG 168.0* 05/21/2012   HDL 38.30* 05/21/2012   LDLCALC 71 05/21/2012   ALT 41* 04/24/2013   AST 43* 04/24/2013   NA 143 04/24/2013   K 3.6 04/24/2013   CL 107 04/24/2013   CREATININE 2.4* 04/24/2013   BUN 35* 04/24/2013  CO2 27 04/24/2013   TSH 4.54 04/24/2013   HGBA1C 6.4 03/22/2013         Assessment & Plan:

## 2013-08-21 ENCOUNTER — Encounter: Payer: Self-pay | Admitting: Internal Medicine

## 2013-08-21 NOTE — Assessment & Plan Note (Signed)
Her BP is well controlled I will monitor her lytes and renal function 

## 2013-08-21 NOTE — Assessment & Plan Note (Signed)
She will try enablex for this

## 2013-08-21 NOTE — Assessment & Plan Note (Signed)
Her blood sugars are well controlled 

## 2013-08-21 NOTE — Assessment & Plan Note (Signed)
Her renal function is stable 

## 2013-08-21 NOTE — Assessment & Plan Note (Signed)
She is not willing to take a statin

## 2013-09-23 LAB — HM MAMMOGRAPHY: HM MAMMO: NORMAL

## 2013-09-29 ENCOUNTER — Other Ambulatory Visit: Payer: Self-pay | Admitting: Internal Medicine

## 2013-12-18 LAB — HM DIABETES EYE EXAM

## 2013-12-20 ENCOUNTER — Ambulatory Visit: Payer: Medicare Other | Admitting: Internal Medicine

## 2013-12-31 ENCOUNTER — Encounter: Payer: Self-pay | Admitting: Internal Medicine

## 2013-12-31 ENCOUNTER — Other Ambulatory Visit (INDEPENDENT_AMBULATORY_CARE_PROVIDER_SITE_OTHER): Payer: Medicare Other

## 2013-12-31 ENCOUNTER — Ambulatory Visit (INDEPENDENT_AMBULATORY_CARE_PROVIDER_SITE_OTHER): Payer: Medicare Other | Admitting: Internal Medicine

## 2013-12-31 VITALS — BP 140/86 | HR 58 | Temp 97.8°F | Resp 16 | Ht 66.0 in | Wt 210.0 lb

## 2013-12-31 DIAGNOSIS — N183 Chronic kidney disease, stage 3 unspecified: Secondary | ICD-10-CM

## 2013-12-31 DIAGNOSIS — N189 Chronic kidney disease, unspecified: Secondary | ICD-10-CM

## 2013-12-31 DIAGNOSIS — I10 Essential (primary) hypertension: Secondary | ICD-10-CM

## 2013-12-31 DIAGNOSIS — E1122 Type 2 diabetes mellitus with diabetic chronic kidney disease: Secondary | ICD-10-CM

## 2013-12-31 DIAGNOSIS — Z23 Encounter for immunization: Secondary | ICD-10-CM

## 2013-12-31 LAB — BASIC METABOLIC PANEL
BUN: 32 mg/dL — ABNORMAL HIGH (ref 6–23)
CO2: 31 meq/L (ref 19–32)
Calcium: 10.1 mg/dL (ref 8.4–10.5)
Chloride: 106 mEq/L (ref 96–112)
Creatinine, Ser: 2.5 mg/dL — ABNORMAL HIGH (ref 0.4–1.2)
GFR: 19.75 mL/min — AB (ref >60.00)
GLUCOSE: 143 mg/dL — AB (ref 70–99)
Potassium: 4.4 mEq/L (ref 3.5–5.1)
SODIUM: 142 meq/L (ref 135–145)

## 2013-12-31 LAB — HEMOGLOBIN A1C: Hgb A1c MFr Bld: 6.3 % (ref 4.6–6.5)

## 2013-12-31 NOTE — Assessment & Plan Note (Signed)
Her blood sugars are well controlled 

## 2013-12-31 NOTE — Progress Notes (Signed)
Pre visit review using our clinic review tool, if applicable. No additional management support is needed unless otherwise documented below in the visit note. 

## 2013-12-31 NOTE — Assessment & Plan Note (Signed)
Her renal function has declined just slightly She will avoid nephrotoxic agents Will control her BP and BS as well as possible

## 2013-12-31 NOTE — Patient Instructions (Signed)

## 2013-12-31 NOTE — Progress Notes (Signed)
   Subjective:    Patient ID: Danielle Frazier, female    DOB: 05-Nov-1930, 80 y.o.   MRN: [id number]  Hypertension This is a chronic problem. The current episode started more than 1 year ago. The problem is controlled. Associated symptoms include anxiety. Pertinent negatives include no blurred vision, chest pain, headaches, malaise/fatigue, neck pain, orthopnea, palpitations, peripheral edema, PND, shortness of breath or sweats. Past treatments include beta blockers, ACE inhibitors and diuretics. The current treatment provides moderate improvement. Compliance problems include diet and exercise.  Hypertensive end-organ damage includes kidney disease. Identifiable causes of hypertension include chronic renal disease.      Review of Systems  Constitutional: Negative.  Negative for fever, chills, malaise/fatigue, diaphoresis, appetite change and fatigue.  HENT: Negative.   Eyes: Negative.  Negative for blurred vision.  Respiratory: Negative.  Negative for cough, choking, chest tightness and shortness of breath.   Cardiovascular: Negative.  Negative for chest pain, palpitations, orthopnea, leg swelling and PND.  Gastrointestinal: Negative.  Negative for nausea, vomiting, abdominal pain, diarrhea, constipation and blood in stool.  Endocrine: Negative.  Negative for polydipsia, polyphagia and polyuria.  Genitourinary: Negative.  Negative for dysuria, urgency, frequency, hematuria, decreased urine volume, enuresis and difficulty urinating.  Musculoskeletal: Negative.  Negative for arthralgias, back pain and neck pain.  Skin: Negative.  Negative for rash.  Allergic/Immunologic: Negative.   Neurological: Negative.  Negative for headaches.  Hematological: Negative.  Negative for adenopathy. Does not bruise/bleed easily.  Psychiatric/Behavioral: Negative.        Objective:   Physical Exam  Vitals reviewed. Constitutional: She is oriented to person, place, and time. She appears well-developed and  well-nourished. No distress.  HENT:  Head: Normocephalic and atraumatic.  Mouth/Throat: Oropharynx is clear and moist. No oropharyngeal exudate.  Eyes: Conjunctivae are normal. Right eye exhibits no discharge. Left eye exhibits no discharge. No scleral icterus.  Neck: Normal range of motion. Neck supple. No JVD present. No tracheal deviation present. No thyromegaly present.  Cardiovascular: Normal rate, regular rhythm, normal heart sounds and intact distal pulses.  Exam reveals no gallop and no friction rub.   No murmur heard. Pulmonary/Chest: Effort normal and breath sounds normal. No stridor. No respiratory distress. She has no wheezes. She has no rales. She exhibits no tenderness.  Abdominal: Soft. Bowel sounds are normal. She exhibits no distension and no mass. There is no tenderness. There is no rebound and no guarding.  Musculoskeletal: Normal range of motion. She exhibits no edema and no tenderness.  Lymphadenopathy:    She has no cervical adenopathy.  Neurological: She is oriented to person, place, and time.  Skin: Skin is warm and dry. No rash noted. She is not diaphoretic. No erythema. No pallor.     Lab Results  Component Value Date   WBC 7.6 08/20/2013   HGB 13.5 08/20/2013   HCT 40.7 08/20/2013   PLT 219.0 08/20/2013   GLUCOSE 125* 08/20/2013   CHOL 153 08/20/2013   TRIG 138.0 08/20/2013   HDL 41.20 08/20/2013   LDLCALC 84 08/20/2013   ALT 41* 04/24/2013   AST 43* 04/24/2013   NA 143 08/20/2013   K 4.0 08/20/2013   CL 108 08/20/2013   CREATININE 2.3* 08/20/2013   BUN 43* 08/20/2013   CO2 28 08/20/2013   TSH 2.94 08/20/2013   HGBA1C 6.4 08/20/2013       Assessment & Plan:

## 2013-12-31 NOTE — Assessment & Plan Note (Signed)
Her BP is well controlled 

## 2014-01-01 ENCOUNTER — Telehealth: Payer: Self-pay | Admitting: Internal Medicine

## 2014-01-01 NOTE — Telephone Encounter (Signed)
emmi mailed  °

## 2014-01-03 ENCOUNTER — Telehealth: Payer: Self-pay

## 2014-01-03 NOTE — Telephone Encounter (Signed)
LVM for pt to call back.    RE: schedule AWV for 2015 with Marya Amsler if pt allows.

## 2014-01-06 ENCOUNTER — Telehealth: Payer: Self-pay

## 2014-01-06 NOTE — Telephone Encounter (Signed)
Spoke to pt. Pt would like to wait until April appointment to do that.

## 2014-01-27 ENCOUNTER — Ambulatory Visit: Payer: Medicare Other | Admitting: Gastroenterology

## 2014-04-12 ENCOUNTER — Other Ambulatory Visit: Payer: Self-pay | Admitting: Internal Medicine

## 2014-05-01 ENCOUNTER — Encounter: Payer: Self-pay | Admitting: Internal Medicine

## 2014-05-25 ENCOUNTER — Other Ambulatory Visit: Payer: Self-pay | Admitting: Internal Medicine

## 2014-07-02 ENCOUNTER — Ambulatory Visit (INDEPENDENT_AMBULATORY_CARE_PROVIDER_SITE_OTHER): Payer: Medicare Other | Admitting: Internal Medicine

## 2014-07-02 ENCOUNTER — Other Ambulatory Visit (INDEPENDENT_AMBULATORY_CARE_PROVIDER_SITE_OTHER): Payer: Medicare Other

## 2014-07-02 ENCOUNTER — Encounter: Payer: Self-pay | Admitting: Internal Medicine

## 2014-07-02 ENCOUNTER — Ambulatory Visit: Payer: Medicare Other | Admitting: Internal Medicine

## 2014-07-02 VITALS — BP 118/68 | HR 63 | Temp 97.8°F | Resp 16 | Ht 66.0 in | Wt 210.0 lb

## 2014-07-02 DIAGNOSIS — N183 Chronic kidney disease, stage 3 unspecified: Secondary | ICD-10-CM

## 2014-07-02 DIAGNOSIS — E1122 Type 2 diabetes mellitus with diabetic chronic kidney disease: Secondary | ICD-10-CM

## 2014-07-02 DIAGNOSIS — I10 Essential (primary) hypertension: Secondary | ICD-10-CM

## 2014-07-02 DIAGNOSIS — N189 Chronic kidney disease, unspecified: Secondary | ICD-10-CM | POA: Diagnosis not present

## 2014-07-02 DIAGNOSIS — E785 Hyperlipidemia, unspecified: Secondary | ICD-10-CM | POA: Diagnosis not present

## 2014-07-02 DIAGNOSIS — Z79891 Long term (current) use of opiate analgesic: Secondary | ICD-10-CM | POA: Diagnosis not present

## 2014-07-02 DIAGNOSIS — Z79899 Other long term (current) drug therapy: Secondary | ICD-10-CM | POA: Diagnosis not present

## 2014-07-02 LAB — CBC WITH DIFFERENTIAL/PLATELET
Basophils Absolute: 0 10*3/uL (ref 0.0–0.1)
Basophils Relative: 0.5 % (ref 0.0–3.0)
EOS ABS: 0.2 10*3/uL (ref 0.0–0.7)
Eosinophils Relative: 3.2 % (ref 0.0–5.0)
HCT: 41.5 % (ref 36.0–46.0)
HEMOGLOBIN: 13.6 g/dL (ref 12.0–15.0)
LYMPHS ABS: 2.3 10*3/uL (ref 0.7–4.0)
LYMPHS PCT: 31.8 % (ref 12.0–46.0)
MCHC: 32.8 g/dL (ref 30.0–36.0)
MCV: 95.8 fl (ref 78.0–100.0)
MONO ABS: 0.5 10*3/uL (ref 0.1–1.0)
Monocytes Relative: 6.5 % (ref 3.0–12.0)
NEUTROS ABS: 4.2 10*3/uL (ref 1.4–7.7)
Neutrophils Relative %: 58 % (ref 43.0–77.0)
Platelets: 201 10*3/uL (ref 150.0–400.0)
RBC: 4.33 Mil/uL (ref 3.87–5.11)
RDW: 14.3 % (ref 11.5–15.5)
WBC: 7.3 10*3/uL (ref 4.0–10.5)

## 2014-07-02 LAB — COMPREHENSIVE METABOLIC PANEL
ALBUMIN: 4 g/dL (ref 3.5–5.2)
ALT: 38 U/L — ABNORMAL HIGH (ref 0–35)
AST: 40 U/L — ABNORMAL HIGH (ref 0–37)
Alkaline Phosphatase: 122 U/L — ABNORMAL HIGH (ref 39–117)
BILIRUBIN TOTAL: 0.6 mg/dL (ref 0.2–1.2)
BUN: 45 mg/dL — ABNORMAL HIGH (ref 6–23)
CALCIUM: 10.3 mg/dL (ref 8.4–10.5)
CO2: 29 mEq/L (ref 19–32)
Chloride: 105 mEq/L (ref 96–112)
Creatinine, Ser: 2.47 mg/dL — ABNORMAL HIGH (ref 0.40–1.20)
GFR: 19.9 mL/min — ABNORMAL LOW (ref >60.00)
GLUCOSE: 133 mg/dL — AB (ref 70–99)
POTASSIUM: 4.1 meq/L (ref 3.5–5.1)
Sodium: 141 mEq/L (ref 135–145)
TOTAL PROTEIN: 7.1 g/dL (ref 6.0–8.3)

## 2014-07-02 LAB — URINALYSIS, ROUTINE W REFLEX MICROSCOPIC
Bilirubin Urine: NEGATIVE
Ketones, ur: NEGATIVE
Leukocytes, UA: NEGATIVE
Nitrite: NEGATIVE
SPECIFIC GRAVITY, URINE: 1.025 (ref 1.000–1.030)
TOTAL PROTEIN, URINE-UPE24: 100 — AB
URINE GLUCOSE: NEGATIVE
UROBILINOGEN UA: 0.2 (ref 0.0–1.0)
pH: 6 (ref 5.0–8.0)

## 2014-07-02 LAB — HEMOGLOBIN A1C: Hgb A1c MFr Bld: 6.3 % (ref 4.6–6.5)

## 2014-07-02 LAB — MICROALBUMIN / CREATININE URINE RATIO
CREATININE, U: 91.4 mg/dL
Microalb Creat Ratio: 105 mg/g — ABNORMAL HIGH (ref 0.0–30.0)
Microalb, Ur: 96 mg/dL — ABNORMAL HIGH (ref 0.0–1.9)

## 2014-07-02 LAB — LIPID PANEL
Cholesterol: 154 mg/dL (ref 0–200)
HDL: 39.3 mg/dL (ref >39.00)
LDL Cholesterol: 75 mg/dL (ref 0–99)
NonHDL: 114.7
Total CHOL/HDL Ratio: 4
Triglycerides: 199 mg/dL — ABNORMAL HIGH (ref 0.0–149.0)
VLDL: 39.8 mg/dL (ref 0.0–40.0)

## 2014-07-02 LAB — TSH: TSH: 3.05 u[IU]/mL (ref 0.35–4.50)

## 2014-07-02 NOTE — Patient Instructions (Signed)
Preventive Care for Adults A healthy lifestyle and preventive care can promote health and wellness. Preventive health guidelines for women include the following key practices.  A routine yearly physical is a good way to check with your health care provider about your health and preventive screening. It is a chance to share any concerns and updates on your health and to receive a thorough exam.  Visit your dentist for a routine exam and preventive care every 6 months. Brush your teeth twice a day and floss once a day. Good oral hygiene prevents tooth decay and gum disease.  The frequency of eye exams is based on your age, health, family medical history, use of contact lenses, and other factors. Follow your health care provider's recommendations for frequency of eye exams.  Eat a healthy diet. Foods like vegetables, fruits, whole grains, low-fat dairy products, and lean protein foods contain the nutrients you need without too many calories. Decrease your intake of foods high in solid fats, added sugars, and salt. Eat the right amount of calories for you.Get information about a proper diet from your health care provider, if necessary.  Regular physical exercise is one of the most important things you can do for your health. Most adults should get at least 150 minutes of moderate-intensity exercise (any activity that increases your heart rate and causes you to sweat) each week. In addition, most adults need muscle-strengthening exercises on 2 or more days a week.  Maintain a healthy weight. The body mass index (BMI) is a screening tool to identify possible weight problems. It provides an estimate of body fat based on height and weight. Your health care provider can find your BMI and can help you achieve or maintain a healthy weight.For adults 20 years and older:  A BMI below 18.5 is considered underweight.  A BMI of 18.5 to 24.9 is normal.  A BMI of 25 to 29.9 is considered overweight.  A BMI of  30 and above is considered obese.  Maintain normal blood lipids and cholesterol levels by exercising and minimizing your intake of saturated fat. Eat a balanced diet with plenty of fruit and vegetables. Blood tests for lipids and cholesterol should begin at age 76 and be repeated every 5 years. If your lipid or cholesterol levels are high, you are over 50, or you are at high risk for heart disease, you may need your cholesterol levels checked more frequently.Ongoing high lipid and cholesterol levels should be treated with medicines if diet and exercise are not working.  If you smoke, find out from your health care provider how to quit. If you do not use tobacco, do not start.  Lung cancer screening is recommended for adults aged 22-80 years who are at high risk for developing lung cancer because of a history of smoking. A yearly low-dose CT scan of the lungs is recommended for people who have at least a 30-pack-year history of smoking and are a current smoker or have quit within the past 15 years. A pack year of smoking is smoking an average of 1 pack of cigarettes a day for 1 year (for example: 1 pack a day for 30 years or 2 packs a day for 15 years). Yearly screening should continue until the smoker has stopped smoking for at least 15 years. Yearly screening should be stopped for people who develop a health problem that would prevent them from having lung cancer treatment.  If you are pregnant, do not drink alcohol. If you are breastfeeding,  be very cautious about drinking alcohol. If you are not pregnant and choose to drink alcohol, do not have more than 1 drink per day. One drink is considered to be 12 ounces (355 mL) of beer, 5 ounces (148 mL) of wine, or 1.5 ounces (44 mL) of liquor.  Avoid use of street drugs. Do not share needles with anyone. Ask for help if you need support or instructions about stopping the use of drugs.  High blood pressure causes heart disease and increases the risk of  stroke. Your blood pressure should be checked at least every 1 to 2 years. Ongoing high blood pressure should be treated with medicines if weight loss and exercise do not work.  If you are 75-52 years old, ask your health care provider if you should take aspirin to prevent strokes.  Diabetes screening involves taking a blood sample to check your fasting blood sugar level. This should be done once every 3 years, after age 15, if you are within normal weight and without risk factors for diabetes. Testing should be considered at a younger age or be carried out more frequently if you are overweight and have at least 1 risk factor for diabetes.  Breast cancer screening is essential preventive care for women. You should practice "breast self-awareness." This means understanding the normal appearance and feel of your breasts and may include breast self-examination. Any changes detected, no matter how small, should be reported to a health care provider. Women in their 58s and 30s should have a clinical breast exam (CBE) by a health care provider as part of a regular health exam every 1 to 3 years. After age 16, women should have a CBE every year. Starting at age 53, women should consider having a mammogram (breast X-ray test) every year. Women who have a family history of breast cancer should talk to their health care provider about genetic screening. Women at a high risk of breast cancer should talk to their health care providers about having an MRI and a mammogram every year.  Breast cancer gene (BRCA)-related cancer risk assessment is recommended for women who have family members with BRCA-related cancers. BRCA-related cancers include breast, ovarian, tubal, and peritoneal cancers. Having family members with these cancers may be associated with an increased risk for harmful changes (mutations) in the breast cancer genes BRCA1 and BRCA2. Results of the assessment will determine the need for genetic counseling and  BRCA1 and BRCA2 testing.  Routine pelvic exams to screen for cancer are no longer recommended for nonpregnant women who are considered low risk for cancer of the pelvic organs (ovaries, uterus, and vagina) and who do not have symptoms. Ask your health care provider if a screening pelvic exam is right for you.  If you have had past treatment for cervical cancer or a condition that could lead to cancer, you need Pap tests and screening for cancer for at least 20 years after your treatment. If Pap tests have been discontinued, your risk factors (such as having a new sexual partner) need to be reassessed to determine if screening should be resumed. Some women have medical problems that increase the chance of getting cervical cancer. In these cases, your health care provider may recommend more frequent screening and Pap tests.  The HPV test is an additional test that may be used for cervical cancer screening. The HPV test looks for the virus that can cause the cell changes on the cervix. The cells collected during the Pap test can be  tested for HPV. The HPV test could be used to screen women aged 30 years and older, and should be used in women of any age who have unclear Pap test results. After the age of 30, women should have HPV testing at the same frequency as a Pap test.  Colorectal cancer can be detected and often prevented. Most routine colorectal cancer screening begins at the age of 50 years and continues through age 75 years. However, your health care provider may recommend screening at an earlier age if you have risk factors for colon cancer. On a yearly basis, your health care provider may provide home test kits to check for hidden blood in the stool. Use of a small camera at the end of a tube, to directly examine the colon (sigmoidoscopy or colonoscopy), can detect the earliest forms of colorectal cancer. Talk to your health care provider about this at age 50, when routine screening begins. Direct  exam of the colon should be repeated every 5-10 years through age 75 years, unless early forms of pre-cancerous polyps or small growths are found.  People who are at an increased risk for hepatitis B should be screened for this virus. You are considered at high risk for hepatitis B if:  You were born in a country where hepatitis B occurs often. Talk with your health care provider about which countries are considered high risk.  Your parents were born in a high-risk country and you have not received a shot to protect against hepatitis B (hepatitis B vaccine).  You have HIV or AIDS.  You use needles to inject street drugs.  You live with, or have sex with, someone who has hepatitis B.  You get hemodialysis treatment.  You take certain medicines for conditions like cancer, organ transplantation, and autoimmune conditions.  Hepatitis C blood testing is recommended for all people born from 1945 through 1965 and any individual with known risks for hepatitis C.  Practice safe sex. Use condoms and avoid high-risk sexual practices to reduce the spread of sexually transmitted infections (STIs). STIs include gonorrhea, chlamydia, syphilis, trichomonas, herpes, HPV, and human immunodeficiency virus (HIV). Herpes, HIV, and HPV are viral illnesses that have no cure. They can result in disability, cancer, and death.  You should be screened for sexually transmitted illnesses (STIs) including gonorrhea and chlamydia if:  You are sexually active and are younger than 24 years.  You are older than 24 years and your health care provider tells you that you are at risk for this type of infection.  Your sexual activity has changed since you were last screened and you are at an increased risk for chlamydia or gonorrhea. Ask your health care provider if you are at risk.  If you are at risk of being infected with HIV, it is recommended that you take a prescription medicine daily to prevent HIV infection. This is  called preexposure prophylaxis (PrEP). You are considered at risk if:  You are a heterosexual woman, are sexually active, and are at increased risk for HIV infection.  You take drugs by injection.  You are sexually active with a partner who has HIV.  Talk with your health care provider about whether you are at high risk of being infected with HIV. If you choose to begin PrEP, you should first be tested for HIV. You should then be tested every 3 months for as long as you are taking PrEP.  Osteoporosis is a disease in which the bones lose minerals and strength   with aging. This can result in serious bone fractures or breaks. The risk of osteoporosis can be identified using a bone density scan. Women ages 65 years and over and women at risk for fractures or osteoporosis should discuss screening with their health care providers. Ask your health care provider whether you should take a calcium supplement or vitamin D to reduce the rate of osteoporosis.  Menopause can be associated with physical symptoms and risks. Hormone replacement therapy is available to decrease symptoms and risks. You should talk to your health care provider about whether hormone replacement therapy is right for you.  Use sunscreen. Apply sunscreen liberally and repeatedly throughout the day. You should seek shade when your shadow is shorter than you. Protect yourself by wearing long sleeves, pants, a wide-brimmed hat, and sunglasses year round, whenever you are outdoors.  Once a month, do a whole body skin exam, using a mirror to look at the skin on your back. Tell your health care provider of new moles, moles that have irregular borders, moles that are larger than a pencil eraser, or moles that have changed in shape or color.  Stay current with required vaccines (immunizations).  Influenza vaccine. All adults should be immunized every year.  Tetanus, diphtheria, and acellular pertussis (Td, Tdap) vaccine. Pregnant women should  receive 1 dose of Tdap vaccine during each pregnancy. The dose should be obtained regardless of the length of time since the last dose. Immunization is preferred during the 27th-36th week of gestation. An adult who has not previously received Tdap or who does not know her vaccine status should receive 1 dose of Tdap. This initial dose should be followed by tetanus and diphtheria toxoids (Td) booster doses every 10 years. Adults with an unknown or incomplete history of completing a 3-dose immunization series with Td-containing vaccines should begin or complete a primary immunization series including a Tdap dose. Adults should receive a Td booster every 10 years.  Varicella vaccine. An adult without evidence of immunity to varicella should receive 2 doses or a second dose if she has previously received 1 dose. Pregnant females who do not have evidence of immunity should receive the first dose after pregnancy. This first dose should be obtained before leaving the health care facility. The second dose should be obtained 4-8 weeks after the first dose.  Human papillomavirus (HPV) vaccine. Females aged 13-26 years who have not received the vaccine previously should obtain the 3-dose series. The vaccine is not recommended for use in pregnant females. However, pregnancy testing is not needed before receiving a dose. If a female is found to be pregnant after receiving a dose, no treatment is needed. In that case, the remaining doses should be delayed until after the pregnancy. Immunization is recommended for any person with an immunocompromised condition through the age of 26 years if she did not get any or all doses earlier. During the 3-dose series, the second dose should be obtained 4-8 weeks after the first dose. The third dose should be obtained 24 weeks after the first dose and 16 weeks after the second dose.  Zoster vaccine. One dose is recommended for adults aged 60 years or older unless certain conditions are  present.  Measles, mumps, and rubella (MMR) vaccine. Adults born before 1957 generally are considered immune to measles and mumps. Adults born in 1957 or later should have 1 or more doses of MMR vaccine unless there is a contraindication to the vaccine or there is laboratory evidence of immunity to   each of the three diseases. A routine second dose of MMR vaccine should be obtained at least 28 days after the first dose for students attending postsecondary schools, health care workers, or international travelers. People who received inactivated measles vaccine or an unknown type of measles vaccine during 1963-1967 should receive 2 doses of MMR vaccine. People who received inactivated mumps vaccine or an unknown type of mumps vaccine before 1979 and are at high risk for mumps infection should consider immunization with 2 doses of MMR vaccine. For females of childbearing age, rubella immunity should be determined. If there is no evidence of immunity, females who are not pregnant should be vaccinated. If there is no evidence of immunity, females who are pregnant should delay immunization until after pregnancy. Unvaccinated health care workers born before 1957 who lack laboratory evidence of measles, mumps, or rubella immunity or laboratory confirmation of disease should consider measles and mumps immunization with 2 doses of MMR vaccine or rubella immunization with 1 dose of MMR vaccine.  Pneumococcal 13-valent conjugate (PCV13) vaccine. When indicated, a person who is uncertain of her immunization history and has no record of immunization should receive the PCV13 vaccine. An adult aged 19 years or older who has certain medical conditions and has not been previously immunized should receive 1 dose of PCV13 vaccine. This PCV13 should be followed with a dose of pneumococcal polysaccharide (PPSV23) vaccine. The PPSV23 vaccine dose should be obtained at least 8 weeks after the dose of PCV13 vaccine. An adult aged 19  years or older who has certain medical conditions and previously received 1 or more doses of PPSV23 vaccine should receive 1 dose of PCV13. The PCV13 vaccine dose should be obtained 1 or more years after the last PPSV23 vaccine dose.  Pneumococcal polysaccharide (PPSV23) vaccine. When PCV13 is also indicated, PCV13 should be obtained first. All adults aged 65 years and older should be immunized. An adult younger than age 65 years who has certain medical conditions should be immunized. Any person who resides in a nursing home or long-term care facility should be immunized. An adult smoker should be immunized. People with an immunocompromised condition and certain other conditions should receive both PCV13 and PPSV23 vaccines. People with human immunodeficiency virus (HIV) infection should be immunized as soon as possible after diagnosis. Immunization during chemotherapy or radiation therapy should be avoided. Routine use of PPSV23 vaccine is not recommended for American Indians, [place] Natives, or people younger than 65 years unless there are medical conditions that require PPSV23 vaccine. When indicated, people who have unknown immunization and have no record of immunization should receive PPSV23 vaccine. One-time revaccination 5 years after the first dose of PPSV23 is recommended for people aged 19-64 years who have chronic kidney failure, nephrotic syndrome, asplenia, or immunocompromised conditions. People who received 1-2 doses of PPSV23 before age 65 years should receive another dose of PPSV23 vaccine at age 65 years or later if at least 5 years have passed since the previous dose. Doses of PPSV23 are not needed for people immunized with PPSV23 at or after age 65 years.  Meningococcal vaccine. Adults with asplenia or persistent complement component deficiencies should receive 2 doses of quadrivalent meningococcal conjugate (MenACWY-D) vaccine. The doses should be obtained at least 2 months apart.  Microbiologists working with certain meningococcal bacteria, military recruits, people at risk during an outbreak, and people who travel to or live in countries with a high rate of meningitis should be immunized. A first-year college student up through age   21 years who is living in a residence hall should receive a dose if she did not receive a dose on or after her 16th birthday. Adults who have certain high-risk conditions should receive one or more doses of vaccine.  Hepatitis A vaccine. Adults who wish to be protected from this disease, have certain high-risk conditions, work with hepatitis A-infected animals, work in hepatitis A research labs, or travel to or work in countries with a high rate of hepatitis A should be immunized. Adults who were previously unvaccinated and who anticipate close contact with an international adoptee during the first 60 days after arrival in the [place] from a country with a high rate of hepatitis A should be immunized.  Hepatitis B vaccine. Adults who wish to be protected from this disease, have certain high-risk conditions, may be exposed to blood or other infectious body fluids, are household contacts or sex partners of hepatitis B positive people, are clients or workers in certain care facilities, or travel to or work in countries with a high rate of hepatitis B should be immunized.  Haemophilus influenzae type b (Hib) vaccine. A previously unvaccinated person with asplenia or sickle cell disease or having a scheduled splenectomy should receive 1 dose of Hib vaccine. Regardless of previous immunization, a recipient of a hematopoietic stem cell transplant should receive a 3-dose series 6-12 months after her successful transplant. Hib vaccine is not recommended for adults with HIV infection. Preventive Services / Frequency Ages 64 to 68 years  Blood pressure check.** / Every 1 to 2 years.  Lipid and cholesterol check.** / Every 5 years beginning at age  22.  Clinical breast exam.** / Every 3 years for women in their 88s and 53s.  BRCA-related cancer risk assessment.** / For women who have family members with a BRCA-related cancer (breast, ovarian, tubal, or peritoneal cancers).  Pap test.** / Every 2 years from ages 90 through 51. Every 3 years starting at age 21 through age 56 or 3 with a history of 3 consecutive normal Pap tests.  HPV screening.** / Every 3 years from ages 24 through ages 1 to 46 with a history of 3 consecutive normal Pap tests.  Hepatitis C blood test.** / For any individual with known risks for hepatitis C.  Skin self-exam. / Monthly.  Influenza vaccine. / Every year.  Tetanus, diphtheria, and acellular pertussis (Tdap, Td) vaccine.** / Consult your health care provider. Pregnant women should receive 1 dose of Tdap vaccine during each pregnancy. 1 dose of Td every 10 years.  Varicella vaccine.** / Consult your health care provider. Pregnant females who do not have evidence of immunity should receive the first dose after pregnancy.  HPV vaccine. / 3 doses over 6 months, if 72 and younger. The vaccine is not recommended for use in pregnant females. However, pregnancy testing is not needed before receiving a dose.  Measles, mumps, rubella (MMR) vaccine.** / You need at least 1 dose of MMR if you were born in 1957 or later. You may also need a 2nd dose. For females of childbearing age, rubella immunity should be determined. If there is no evidence of immunity, females who are not pregnant should be vaccinated. If there is no evidence of immunity, females who are pregnant should delay immunization until after pregnancy.  Pneumococcal 13-valent conjugate (PCV13) vaccine.** / Consult your health care provider.  Pneumococcal polysaccharide (PPSV23) vaccine.** / 1 to 2 doses if you smoke cigarettes or if you have certain conditions.  Meningococcal vaccine.** /  1 dose if you are age 19 to 21 years and a first-year college  student living in a residence hall, or have one of several medical conditions, you need to get vaccinated against meningococcal disease. You may also need additional booster doses.  Hepatitis A vaccine.** / Consult your health care provider.  Hepatitis B vaccine.** / Consult your health care provider.  Haemophilus influenzae type b (Hib) vaccine.** / Consult your health care provider. Ages 40 to 64 years  Blood pressure check.** / Every 1 to 2 years.  Lipid and cholesterol check.** / Every 5 years beginning at age 20 years.  Lung cancer screening. / Every year if you are aged 55-80 years and have a 30-pack-year history of smoking and currently smoke or have quit within the past 15 years. Yearly screening is stopped once you have quit smoking for at least 15 years or develop a health problem that would prevent you from having lung cancer treatment.  Clinical breast exam.** / Every year after age 40 years.  BRCA-related cancer risk assessment.** / For women who have family members with a BRCA-related cancer (breast, ovarian, tubal, or peritoneal cancers).  Mammogram.** / Every year beginning at age 40 years and continuing for as long as you are in good health. Consult with your health care provider.  Pap test.** / Every 3 years starting at age 30 years through age 65 or 70 years with a history of 3 consecutive normal Pap tests.  HPV screening.** / Every 3 years from ages 30 years through ages 65 to 70 years with a history of 3 consecutive normal Pap tests.  Fecal occult blood test (FOBT) of stool. / Every year beginning at age 50 years and continuing until age 75 years. You may not need to do this test if you get a colonoscopy every 10 years.  Flexible sigmoidoscopy or colonoscopy.** / Every 5 years for a flexible sigmoidoscopy or every 10 years for a colonoscopy beginning at age 50 years and continuing until age 75 years.  Hepatitis C blood test.** / For all people born from 1945 through  1965 and any individual with known risks for hepatitis C.  Skin self-exam. / Monthly.  Influenza vaccine. / Every year.  Tetanus, diphtheria, and acellular pertussis (Tdap/Td) vaccine.** / Consult your health care provider. Pregnant women should receive 1 dose of Tdap vaccine during each pregnancy. 1 dose of Td every 10 years.  Varicella vaccine.** / Consult your health care provider. Pregnant females who do not have evidence of immunity should receive the first dose after pregnancy.  Zoster vaccine.** / 1 dose for adults aged 60 years or older.  Measles, mumps, rubella (MMR) vaccine.** / You need at least 1 dose of MMR if you were born in 1957 or later. You may also need a 2nd dose. For females of childbearing age, rubella immunity should be determined. If there is no evidence of immunity, females who are not pregnant should be vaccinated. If there is no evidence of immunity, females who are pregnant should delay immunization until after pregnancy.  Pneumococcal 13-valent conjugate (PCV13) vaccine.** / Consult your health care provider.  Pneumococcal polysaccharide (PPSV23) vaccine.** / 1 to 2 doses if you smoke cigarettes or if you have certain conditions.  Meningococcal vaccine.** / Consult your health care provider.  Hepatitis A vaccine.** / Consult your health care provider.  Hepatitis B vaccine.** / Consult your health care provider.  Haemophilus influenzae type b (Hib) vaccine.** / Consult your health care provider. Ages 65   years and over  Blood pressure check.** / Every 1 to 2 years.  Lipid and cholesterol check.** / Every 5 years beginning at age 22 years.  Lung cancer screening. / Every year if you are aged 73-80 years and have a 30-pack-year history of smoking and currently smoke or have quit within the past 15 years. Yearly screening is stopped once you have quit smoking for at least 15 years or develop a health problem that would prevent you from having lung cancer  treatment.  Clinical breast exam.** / Every year after age 4 years.  BRCA-related cancer risk assessment.** / For women who have family members with a BRCA-related cancer (breast, ovarian, tubal, or peritoneal cancers).  Mammogram.** / Every year beginning at age 40 years and continuing for as long as you are in good health. Consult with your health care provider.  Pap test.** / Every 3 years starting at age 9 years through age 34 or 91 years with 3 consecutive normal Pap tests. Testing can be stopped between 65 and 70 years with 3 consecutive normal Pap tests and no abnormal Pap or HPV tests in the past 10 years.  HPV screening.** / Every 3 years from ages 57 years through ages 64 or 45 years with a history of 3 consecutive normal Pap tests. Testing can be stopped between 65 and 70 years with 3 consecutive normal Pap tests and no abnormal Pap or HPV tests in the past 10 years.  Fecal occult blood test (FOBT) of stool. / Every year beginning at age 15 years and continuing until age 17 years. You may not need to do this test if you get a colonoscopy every 10 years.  Flexible sigmoidoscopy or colonoscopy.** / Every 5 years for a flexible sigmoidoscopy or every 10 years for a colonoscopy beginning at age 86 years and continuing until age 71 years.  Hepatitis C blood test.** / For all people born from [birth year] through 1963 and any individual with known risks for hepatitis C.  Osteoporosis screening.** / A one-time screening for women ages 83 years and over and women at risk for fractures or osteoporosis.  Skin self-exam. / Monthly.  Influenza vaccine. / Every year.  Tetanus, diphtheria, and acellular pertussis (Tdap/Td) vaccine.** / 1 dose of Td every 10 years.  Varicella vaccine.** / Consult your health care provider.  Zoster vaccine.** / 1 dose for adults aged 61 years or older.  Pneumococcal 13-valent conjugate (PCV13) vaccine.** / Consult your health care provider.  Pneumococcal  polysaccharide (PPSV23) vaccine.** / 1 dose for all adults aged 28 years and older.  Meningococcal vaccine.** / Consult your health care provider.  Hepatitis A vaccine.** / Consult your health care provider.  Hepatitis B vaccine.** / Consult your health care provider.  Haemophilus influenzae type b (Hib) vaccine.** / Consult your health care provider. ** Family history and personal history of risk and conditions may change your health care provider's recommendations. Document Released: 04/26/2001 Document Revised: 07/15/2013 Document Reviewed: 07/26/2010 [facility] Patient Information 2015 [place], [place]. This information is not intended to replace advice given to you by your health care provider. Make sure you discuss any questions you have with your health care provider.

## 2014-07-02 NOTE — Progress Notes (Signed)
   Subjective:    Patient ID: Danielle Frazier, female    DOB: 24-Sep-1930, 81 y.o.   MRN: [id number]  Hypertension This is a chronic problem. The current episode started more than 1 year ago. The problem has been gradually improving since onset. The problem is controlled. Associated symptoms include anxiety. Pertinent negatives include no blurred vision, chest pain, headaches, malaise/fatigue, neck pain, orthopnea, palpitations, peripheral edema, PND, shortness of breath or sweats. Past treatments include diuretics and ACE inhibitors. The current treatment provides significant improvement. Compliance problems include diet and exercise.  Hypertensive end-organ damage includes kidney disease.      Review of Systems  Constitutional: Negative.  Negative for fever, chills, malaise/fatigue, diaphoresis, appetite change and fatigue.  HENT: Negative.   Eyes: Negative.  Negative for blurred vision.  Respiratory: Negative.  Negative for cough, choking, chest tightness, shortness of breath and stridor.   Cardiovascular: Negative.  Negative for chest pain, palpitations, orthopnea, leg swelling and PND.  Gastrointestinal: Negative.  Negative for nausea, vomiting, abdominal pain, diarrhea, constipation and blood in stool.  Endocrine: Negative.  Negative for polydipsia, polyphagia and polyuria.  Genitourinary: Negative.   Musculoskeletal: Positive for arthralgias. Negative for myalgias, back pain, joint swelling and neck pain.  Skin: Negative.  Negative for rash.  Allergic/Immunologic: Negative.   Neurological: Negative.  Negative for headaches.  Hematological: Negative.  Negative for adenopathy. Does not bruise/bleed easily.  Psychiatric/Behavioral: Negative.        Objective:   Physical Exam  Constitutional: She is oriented to person, place, and time. She appears well-developed and well-nourished. No distress.  HENT:  Head: Normocephalic and atraumatic.  Mouth/Throat: Oropharynx is clear and moist.  No oropharyngeal exudate.  Eyes: Conjunctivae are normal. Right eye exhibits no discharge. Left eye exhibits no discharge. No scleral icterus.  Neck: Normal range of motion. Neck supple. No JVD present. No tracheal deviation present. No thyromegaly present.  Cardiovascular: Normal rate, regular rhythm, normal heart sounds and intact distal pulses.  Exam reveals no gallop and no friction rub.   No murmur heard. Pulmonary/Chest: Effort normal and breath sounds normal. No stridor. No respiratory distress. She has no wheezes. She has no rales. She exhibits no tenderness.  Abdominal: Soft. Bowel sounds are normal. She exhibits no distension and no mass. There is no tenderness. There is no rebound and no guarding.  Musculoskeletal: Normal range of motion. She exhibits no edema or tenderness.  Lymphadenopathy:    She has no cervical adenopathy.  Neurological: She is oriented to person, place, and time.  Skin: Skin is warm and dry. No rash noted. She is not diaphoretic. No erythema. No pallor.  Psychiatric: She has a normal mood and affect. Her behavior is normal. Judgment and thought content normal.  Vitals reviewed.    Lab Results  Component Value Date   WBC 7.6 08/20/2013   HGB 13.5 08/20/2013   HCT 40.7 08/20/2013   PLT 219.0 08/20/2013   GLUCOSE 143* 12/31/2013   CHOL 153 08/20/2013   TRIG 138.0 08/20/2013   HDL 41.20 08/20/2013   LDLCALC 84 08/20/2013   ALT 41* 04/24/2013   AST 43* 04/24/2013   NA 142 12/31/2013   K 4.4 12/31/2013   CL 106 12/31/2013   CREATININE 2.5* 12/31/2013   BUN 32* 12/31/2013   CO2 31 12/31/2013   TSH 2.94 08/20/2013   HGBA1C 6.3 12/31/2013       Assessment & Plan:

## 2014-07-02 NOTE — Progress Notes (Signed)
Pre visit review using our clinic review tool, if applicable. No additional management support is needed unless otherwise documented below in the visit note. 

## 2014-07-05 ENCOUNTER — Encounter: Payer: Self-pay | Admitting: Internal Medicine

## 2014-07-05 NOTE — Assessment & Plan Note (Signed)
Her LDL is well controlled She does not want to start a statin

## 2014-07-05 NOTE — Assessment & Plan Note (Signed)
She has prediabetes No meds are needed She will work to improve her lifestyle modifications

## 2014-07-05 NOTE — Assessment & Plan Note (Signed)
Her BP is well controlled Her renal function is stable Lytes are all WNL

## 2014-07-05 NOTE — Assessment & Plan Note (Signed)
Her renal function is stable Will avoid nephrotoxic agents Will maintain good control of the BP

## 2014-08-06 ENCOUNTER — Encounter: Payer: Self-pay | Admitting: Internal Medicine

## 2014-09-04 ENCOUNTER — Other Ambulatory Visit: Payer: Self-pay

## 2014-09-04 MED ORDER — NEBIVOLOL HCL 5 MG PO TABS: 5.0000 mg | ORAL_TABLET | Freq: Every day | ORAL | Status: DC

## 2014-11-28 ENCOUNTER — Other Ambulatory Visit: Payer: Self-pay

## 2014-11-28 MED ORDER — ALLOPURINOL 300 MG PO TABS: 150.0000 mg | ORAL_TABLET | Freq: Every day | ORAL | Status: DC

## 2014-12-12 ENCOUNTER — Other Ambulatory Visit: Payer: Self-pay

## 2014-12-12 MED ORDER — LISINOPRIL-HYDROCHLOROTHIAZIDE 20-25 MG PO TABS: 1.0000 | ORAL_TABLET | Freq: Every day | ORAL | Status: DC

## 2014-12-25 DIAGNOSIS — Z1231 Encounter for screening mammogram for malignant neoplasm of breast: Secondary | ICD-10-CM | POA: Diagnosis not present

## 2014-12-25 LAB — HM MAMMOGRAPHY

## 2014-12-29 ENCOUNTER — Encounter: Payer: Self-pay | Admitting: Internal Medicine

## 2015-01-01 ENCOUNTER — Ambulatory Visit (INDEPENDENT_AMBULATORY_CARE_PROVIDER_SITE_OTHER): Payer: Medicare Other | Admitting: Internal Medicine

## 2015-01-01 ENCOUNTER — Encounter: Payer: Self-pay | Admitting: Internal Medicine

## 2015-01-01 ENCOUNTER — Other Ambulatory Visit (INDEPENDENT_AMBULATORY_CARE_PROVIDER_SITE_OTHER): Payer: Medicare Other

## 2015-01-01 VITALS — BP 136/88 | HR 60 | Temp 98.0°F | Resp 16 | Ht 66.0 in | Wt 208.0 lb

## 2015-01-01 DIAGNOSIS — Z23 Encounter for immunization: Secondary | ICD-10-CM

## 2015-01-01 DIAGNOSIS — E1121 Type 2 diabetes mellitus with diabetic nephropathy: Secondary | ICD-10-CM

## 2015-01-01 DIAGNOSIS — N183 Chronic kidney disease, stage 3 unspecified: Secondary | ICD-10-CM

## 2015-01-01 DIAGNOSIS — I10 Essential (primary) hypertension: Secondary | ICD-10-CM

## 2015-01-01 DIAGNOSIS — N189 Chronic kidney disease, unspecified: Secondary | ICD-10-CM

## 2015-01-01 LAB — COMPREHENSIVE METABOLIC PANEL
ALT: 32 U/L (ref 0–35)
AST: 32 U/L (ref 0–37)
Albumin: 4.1 g/dL (ref 3.5–5.2)
Alkaline Phosphatase: 128 U/L — ABNORMAL HIGH (ref 39–117)
BUN: 37 mg/dL — AB (ref 6–23)
CO2: 30 mEq/L (ref 19–32)
Calcium: 10.1 mg/dL (ref 8.4–10.5)
Chloride: 106 mEq/L (ref 96–112)
Creatinine, Ser: 2.7 mg/dL — ABNORMAL HIGH (ref 0.40–1.20)
GFR: 17.94 mL/min — AB (ref >60.00)
GLUCOSE: 133 mg/dL — AB (ref 70–99)
POTASSIUM: 3.9 meq/L (ref 3.5–5.1)
SODIUM: 144 meq/L (ref 135–145)
Total Bilirubin: 0.5 mg/dL (ref 0.2–1.2)
Total Protein: 7.2 g/dL (ref 6.0–8.3)

## 2015-01-01 LAB — HEMOGLOBIN A1C: Hgb A1c MFr Bld: 6.2 % (ref 4.6–6.5)

## 2015-01-01 NOTE — Progress Notes (Signed)
Subjective:  Patient ID: Danielle Frazier, female    DOB: 1930-07-28  Age: 81 y.o. MRN: [id number]  CC: Hypertension   HPI Danielle Frazier presents for f/up on HTN - she feels well and offers no new complaints.  Outpatient Prescriptions Prior to Visit  Medication Sig Dispense Refill  . allopurinol (ZYLOPRIM) 300 MG tablet Take 0.5 tablets (150 mg total) by mouth daily. 45 tablet 1  . Blood Glucose Monitoring Suppl (BAYER CONTOUR MONITOR) W/DEVICE KIT 1 Act by Does not apply route 2 (two) times daily. 100 kit 0  . clonazePAM (KLONOPIN) 0.5 MG tablet TAKE 1 TABLET TWICE A DAY 60 tablet 3  . darifenacin (ENABLEX) 15 MG 24 hr tablet Take 1 tablet (15 mg total) by mouth daily. 90 tablet 3  . glucose blood (BAYER CONTOUR TEST) test strip Use BID 100 each 11  . Linaclotide (LINZESS) 145 MCG CAPS capsule Take 1 capsule (145 mcg total) by mouth daily. 35 capsule 0  . lisinopril-hydrochlorothiazide (PRINZIDE,ZESTORETIC) 20-25 MG tablet Take 1 tablet by mouth daily. 90 tablet 1  . Multiple Vitamins-Minerals (CENTRUM SILVER PO) Take 1 tablet by mouth daily.      . nebivolol (BYSTOLIC) 5 MG tablet Take 1 tablet (5 mg total) by mouth daily. 90 tablet 1  . omeprazole (PRILOSEC OTC) 20 MG tablet Take 20 mg by mouth daily.      . traMADol (ULTRAM) 50 MG tablet Take 1 tablet (50 mg total) by mouth every 6 (six) hours as needed for pain. 65 tablet 11   No facility-administered medications prior to visit.    ROS Review of Systems  Constitutional: Negative.  Negative for fever, chills, diaphoresis, appetite change and fatigue.  HENT: Negative.  Negative for sore throat, trouble swallowing and voice change.   Eyes: Negative.  Negative for photophobia and visual disturbance.  Respiratory: Negative.  Negative for cough, choking, chest tightness and shortness of breath.   Cardiovascular: Negative.  Negative for chest pain, palpitations and leg swelling.  Gastrointestinal: Positive for constipation. Negative  for nausea, vomiting, abdominal pain and diarrhea.  Endocrine: Negative.   Genitourinary: Negative.   Musculoskeletal: Positive for arthralgias.  Skin: Negative.   Allergic/Immunologic: Negative.   Neurological: Negative.  Negative for dizziness.  Hematological: Negative.  Negative for adenopathy. Does not bruise/bleed easily.  Psychiatric/Behavioral: Negative.     Objective:  BP 136/88 mmHg  Pulse 60  Temp(Src) 98 F (36.7 C) (Oral)  Resp 16  Ht _0  (1.676 m)  Wt 208 lb (94.348 kg)  BMI 33.59 kg/m2  SpO2 94%  BP Readings from Last 3 Encounters:  01/01/15 136/88  07/02/14 118/68  12/31/13 140/86    Wt Readings from Last 3 Encounters:  01/01/15 208 lb (94.348 kg)  07/02/14 210 lb (95.255 kg)  12/31/13 210 lb (95.255 kg)    Physical Exam  Constitutional: She is oriented to person, place, and time. No distress.  HENT:  Head: Normocephalic and atraumatic.  Mouth/Throat: Oropharynx is clear and moist. No oropharyngeal exudate.  Eyes: Conjunctivae are normal. Right eye exhibits no discharge. Left eye exhibits no discharge. No scleral icterus.  Neck: Normal range of motion. Neck supple. No JVD present. No tracheal deviation present. No thyromegaly present.  Cardiovascular: Normal rate, regular rhythm, normal heart sounds and intact distal pulses.  Exam reveals no gallop and no friction rub.   No murmur heard. Pulmonary/Chest: Effort normal and breath sounds normal. No stridor. No respiratory distress. She has no wheezes. She has no  rales. She exhibits no tenderness.  Abdominal: Soft. Bowel sounds are normal. She exhibits no distension and no mass. There is no tenderness. There is no rebound and no guarding.  Musculoskeletal: Normal range of motion. She exhibits no edema or tenderness.  Lymphadenopathy:    She has no cervical adenopathy.  Neurological: She is oriented to person, place, and time.  Skin: Skin is warm and dry. No rash noted. She is not diaphoretic. No  erythema. No pallor.  Vitals reviewed.   Lab Results  Component Value Date   WBC 7.3 07/02/2014   HGB 13.6 07/02/2014   HCT 41.5 07/02/2014   PLT 201.0 07/02/2014   GLUCOSE 133* 01/01/2015   CHOL 154 07/02/2014   TRIG 199.0* 07/02/2014   HDL 39.30 07/02/2014   LDLCALC 75 07/02/2014   ALT 32 01/01/2015   AST 32 01/01/2015   NA 144 01/01/2015   K 3.9 01/01/2015   CL 106 01/01/2015   CREATININE 2.70* 01/01/2015   BUN 37* 01/01/2015   CO2 30 01/01/2015   TSH 3.05 07/02/2014   HGBA1C 6.2 01/01/2015   MICROALBUR 96.0* 07/02/2014    No results found.  Assessment & Plan:   Danielle Frazier was seen today for hypertension.  Diagnoses and all orders for this visit:  Essential hypertension- her BP is well controlled, lytes and renal function are stable -     Comprehensive metabolic panel; Future  Chronic renal insufficiency, stage III (moderate)- her renal function is stable, she will avoid nsaids, will maintain good BP controlled -     Comprehensive metabolic panel; Future  Type 2 diabetes mellitus with diabetic nephropathy, without long-term current use of insulin (Danielle Frazier)- her blood sugars are well controlled, she does not need any oral meds at this time -     Comprehensive metabolic panel; Future -     Hemoglobin A1c; Future  Need for influenza vaccination -     Flu vaccine HIGH DOSE PF  I have discontinued Danielle Frazier's traMADol. I am also having her maintain her Multiple Vitamins-Minerals (CENTRUM SILVER PO), omeprazole, BAYER CONTOUR MONITOR, glucose blood, Linaclotide, darifenacin, clonazePAM, nebivolol, allopurinol, and lisinopril-hydrochlorothiazide.  No orders of the defined types were placed in this encounter.     Follow-up: Return in about 6 months (around 07/02/2015).  [name], MD

## 2015-01-01 NOTE — Patient Instructions (Signed)
Hypertension Hypertension, commonly called high blood pressure, is when the force of blood pumping through your arteries is too strong. Your arteries are the blood vessels that carry blood from your heart throughout your body. A blood pressure reading consists of a higher number over a lower number, such as 110/72. The higher number (systolic) is the pressure inside your arteries when your heart pumps. The lower number (diastolic) is the pressure inside your arteries when your heart relaxes. Ideally you want your blood pressure below 120/80. Hypertension forces your heart to work harder to pump blood. Your arteries may become narrow or stiff. Having untreated or uncontrolled hypertension can cause heart attack, stroke, kidney disease, and other problems. RISK FACTORS Some risk factors for high blood pressure are controllable. Others are not.  Risk factors you cannot control include:   Race. You may be at higher risk if you are African American.  Age. Risk increases with age.  Gender. Men are at higher risk than women before age 45 years. After age 65, women are at higher risk than men. Risk factors you can control include:  Not getting enough exercise or physical activity.  Being overweight.  Getting too much fat, sugar, calories, or salt in your diet.  Drinking too much alcohol. SIGNS AND SYMPTOMS Hypertension does not usually cause signs or symptoms. Extremely high blood pressure (hypertensive crisis) may cause headache, anxiety, shortness of breath, and nosebleed. DIAGNOSIS To check if you have hypertension, your health care provider will measure your blood pressure while you are seated, with your arm held at the level of your heart. It should be measured at least twice using the same arm. Certain conditions can cause a difference in blood pressure between your right and left arms. A blood pressure reading that is higher than normal on one occasion does not mean that you need treatment. If  it is not clear whether you have high blood pressure, you may be asked to return on a different day to have your blood pressure checked again. Or, you may be asked to monitor your blood pressure at home for 1 or more weeks. TREATMENT Treating high blood pressure includes making lifestyle changes and possibly taking medicine. Living a healthy lifestyle can help lower high blood pressure. You may need to change some of your habits. Lifestyle changes may include:  Following the DASH diet. This diet is high in fruits, vegetables, and whole grains. It is low in salt, red meat, and added sugars.  Keep your sodium intake below 2,300 mg per day.  Getting at least 30-45 minutes of aerobic exercise at least 4 times per week.  Losing weight if necessary.  Not smoking.  Limiting alcoholic beverages.  Learning ways to reduce stress. Your health care provider may prescribe medicine if lifestyle changes are not enough to get your blood pressure under control, and if one of the following is true:  You are 18-59 years of age and your systolic blood pressure is above 140.  You are 60 years of age or older, and your systolic blood pressure is above 150.  Your diastolic blood pressure is above 90.  You have diabetes, and your systolic blood pressure is over 140 or your diastolic blood pressure is over 90.  You have kidney disease and your blood pressure is above 140/90.  You have heart disease and your blood pressure is above 140/90. Your personal target blood pressure may vary depending on your medical conditions, your age, and other factors. HOME CARE INSTRUCTIONS    Have your blood pressure rechecked as directed by your health care provider.   Take medicines only as directed by your health care provider. Follow the directions carefully. Blood pressure medicines must be taken as prescribed. The medicine does not work as well when you skip doses. Skipping doses also puts you at risk for  problems.  Do not smoke.   Monitor your blood pressure at home as directed by your health care provider. SEEK MEDICAL CARE IF:   You think you are having a reaction to medicines taken.  You have recurrent headaches or feel dizzy.  You have swelling in your ankles.  You have trouble with your vision. SEEK IMMEDIATE MEDICAL CARE IF:  You develop a severe headache or confusion.  You have unusual weakness, numbness, or feel faint.  You have severe chest or abdominal pain.  You vomit repeatedly.  You have trouble breathing. MAKE SURE YOU:   Understand these instructions.  Will watch your condition.  Will get help right away if you are not doing well or get worse.   This information is not intended to replace advice given to you by your health care provider. Make sure you discuss any questions you have with your health care provider.   Document Released: 02/28/2005 Document Revised: 07/15/2014 Document Reviewed: 12/21/2012 [employer] Interactive Patient Education 2016 [employer]  

## 2015-01-01 NOTE — Progress Notes (Signed)
Pre visit review using our clinic review tool, if applicable. No additional management support is needed unless otherwise documented below in the visit note. 

## 2015-02-12 DIAGNOSIS — M546 Pain in thoracic spine: Secondary | ICD-10-CM | POA: Diagnosis not present

## 2015-02-24 ENCOUNTER — Other Ambulatory Visit: Payer: Self-pay | Admitting: Internal Medicine

## 2015-03-02 ENCOUNTER — Encounter: Payer: Self-pay | Admitting: Internal Medicine

## 2015-04-08 ENCOUNTER — Other Ambulatory Visit: Payer: Self-pay | Admitting: Internal Medicine

## 2015-05-25 ENCOUNTER — Other Ambulatory Visit: Payer: Self-pay | Admitting: Internal Medicine

## 2015-05-25 NOTE — Telephone Encounter (Signed)
Last OV adddressing gout was may/2014----are you ok with refilling---please advise, thanks

## 2015-05-26 DIAGNOSIS — H524 Presbyopia: Secondary | ICD-10-CM | POA: Diagnosis not present

## 2015-05-26 DIAGNOSIS — H5203 Hypermetropia, bilateral: Secondary | ICD-10-CM | POA: Diagnosis not present

## 2015-05-26 DIAGNOSIS — Z961 Presence of intraocular lens: Secondary | ICD-10-CM | POA: Diagnosis not present

## 2015-05-26 DIAGNOSIS — H52223 Regular astigmatism, bilateral: Secondary | ICD-10-CM | POA: Diagnosis not present

## 2015-05-26 LAB — HM DIABETES EYE EXAM

## 2015-06-13 ENCOUNTER — Other Ambulatory Visit: Payer: Self-pay | Admitting: Internal Medicine

## 2015-07-06 ENCOUNTER — Encounter: Payer: Self-pay | Admitting: Internal Medicine

## 2015-07-06 ENCOUNTER — Ambulatory Visit (INDEPENDENT_AMBULATORY_CARE_PROVIDER_SITE_OTHER): Payer: Medicare Other | Admitting: Internal Medicine

## 2015-07-06 ENCOUNTER — Other Ambulatory Visit (INDEPENDENT_AMBULATORY_CARE_PROVIDER_SITE_OTHER): Payer: Medicare Other

## 2015-07-06 VITALS — BP 138/84 | HR 62 | Temp 98.8°F | Resp 16 | Ht 66.0 in | Wt 203.0 lb

## 2015-07-06 DIAGNOSIS — N183 Chronic kidney disease, stage 3 unspecified: Secondary | ICD-10-CM

## 2015-07-06 DIAGNOSIS — Z23 Encounter for immunization: Secondary | ICD-10-CM | POA: Diagnosis not present

## 2015-07-06 DIAGNOSIS — K5900 Constipation, unspecified: Secondary | ICD-10-CM

## 2015-07-06 DIAGNOSIS — I10 Essential (primary) hypertension: Secondary | ICD-10-CM

## 2015-07-06 DIAGNOSIS — E785 Hyperlipidemia, unspecified: Secondary | ICD-10-CM

## 2015-07-06 DIAGNOSIS — N189 Chronic kidney disease, unspecified: Secondary | ICD-10-CM

## 2015-07-06 DIAGNOSIS — E1121 Type 2 diabetes mellitus with diabetic nephropathy: Secondary | ICD-10-CM

## 2015-07-06 DIAGNOSIS — Q6102 Congenital multiple renal cysts: Secondary | ICD-10-CM | POA: Diagnosis not present

## 2015-07-06 DIAGNOSIS — Z0000 Encounter for general adult medical examination without abnormal findings: Secondary | ICD-10-CM | POA: Insufficient documentation

## 2015-07-06 DIAGNOSIS — M109 Gout, unspecified: Secondary | ICD-10-CM | POA: Diagnosis not present

## 2015-07-06 DIAGNOSIS — K5909 Other constipation: Secondary | ICD-10-CM

## 2015-07-06 DIAGNOSIS — K219 Gastro-esophageal reflux disease without esophagitis: Secondary | ICD-10-CM | POA: Insufficient documentation

## 2015-07-06 DIAGNOSIS — N281 Cyst of kidney, acquired: Secondary | ICD-10-CM

## 2015-07-06 HISTORY — DX: Gastro-esophageal reflux disease without esophagitis: K21.9

## 2015-07-06 LAB — URINALYSIS, ROUTINE W REFLEX MICROSCOPIC
Bilirubin Urine: NEGATIVE
Ketones, ur: NEGATIVE
Leukocytes, UA: NEGATIVE
NITRITE: NEGATIVE
Specific Gravity, Urine: 1.025 (ref 1.000–1.030)
Urine Glucose: NEGATIVE
Urobilinogen, UA: 0.2 (ref 0.0–1.0)
WBC UA: NONE SEEN (ref 0–?)
pH: 5.5 (ref 5.0–8.0)

## 2015-07-06 LAB — CBC WITH DIFFERENTIAL/PLATELET
BASOS ABS: 0 10*3/uL (ref 0.0–0.1)
Basophils Relative: 0.5 % (ref 0.0–3.0)
EOS ABS: 0.2 10*3/uL (ref 0.0–0.7)
Eosinophils Relative: 2.7 % (ref 0.0–5.0)
HCT: 41.3 % (ref 36.0–46.0)
Hemoglobin: 13.7 g/dL (ref 12.0–15.0)
LYMPHS ABS: 2.2 10*3/uL (ref 0.7–4.0)
LYMPHS PCT: 28.2 % (ref 12.0–46.0)
MCHC: 33.2 g/dL (ref 30.0–36.0)
MCV: 96.5 fl (ref 78.0–100.0)
Monocytes Absolute: 0.5 10*3/uL (ref 0.1–1.0)
Monocytes Relative: 5.8 % (ref 3.0–12.0)
NEUTROS ABS: 4.9 10*3/uL (ref 1.4–7.7)
NEUTROS PCT: 62.8 % (ref 43.0–77.0)
PLATELETS: 210 10*3/uL (ref 150.0–400.0)
RBC: 4.28 Mil/uL (ref 3.87–5.11)
RDW: 14.3 % (ref 11.5–15.5)
WBC: 7.8 10*3/uL (ref 4.0–10.5)

## 2015-07-06 LAB — COMPREHENSIVE METABOLIC PANEL
ALK PHOS: 131 U/L — AB (ref 39–117)
ALT: 28 U/L (ref 0–35)
AST: 27 U/L (ref 0–37)
Albumin: 4 g/dL (ref 3.5–5.2)
BILIRUBIN TOTAL: 0.6 mg/dL (ref 0.2–1.2)
BUN: 41 mg/dL — ABNORMAL HIGH (ref 6–23)
CALCIUM: 10.3 mg/dL (ref 8.4–10.5)
CO2: 29 meq/L (ref 19–32)
CREATININE: 2.72 mg/dL — AB (ref 0.40–1.20)
Chloride: 105 mEq/L (ref 96–112)
GFR: 17.76 mL/min — ABNORMAL LOW (ref >60.00)
Glucose, Bld: 138 mg/dL — ABNORMAL HIGH (ref 70–99)
Potassium: 3.8 mEq/L (ref 3.5–5.1)
Sodium: 143 mEq/L (ref 135–145)
TOTAL PROTEIN: 7.1 g/dL (ref 6.0–8.3)

## 2015-07-06 LAB — LIPID PANEL
CHOL/HDL RATIO: 4
CHOLESTEROL: 158 mg/dL (ref 0–200)
HDL: 39.8 mg/dL (ref >39.00)
NonHDL: 118.56
Triglycerides: 209 mg/dL — ABNORMAL HIGH (ref 0.0–149.0)
VLDL: 41.8 mg/dL — ABNORMAL HIGH (ref 0.0–40.0)

## 2015-07-06 LAB — LDL CHOLESTEROL, DIRECT: Direct LDL: 90 mg/dL

## 2015-07-06 LAB — T4, FREE: Free T4: 0.86 ng/dL (ref 0.60–1.60)

## 2015-07-06 LAB — MICROALBUMIN / CREATININE URINE RATIO
CREATININE, U: 171.1 mg/dL
MICROALB UR: 101.9 mg/dL — AB (ref 0.0–1.9)
MICROALB/CREAT RATIO: 59.5 mg/g — AB (ref 0.0–30.0)

## 2015-07-06 LAB — TSH: TSH: 3.13 u[IU]/mL (ref 0.35–4.50)

## 2015-07-06 LAB — URIC ACID: URIC ACID, SERUM: 6.3 mg/dL (ref 2.4–7.0)

## 2015-07-06 LAB — HEMOGLOBIN A1C: HEMOGLOBIN A1C: 6.3 % (ref 4.6–6.5)

## 2015-07-06 MED ORDER — RANITIDINE HCL 300 MG PO TABS: 300.0000 mg | ORAL_TABLET | Freq: Every day | ORAL | Status: DC

## 2015-07-06 MED ORDER — CLONAZEPAM 0.5 MG PO TABS: 0.5000 mg | ORAL_TABLET | Freq: Two times a day (BID) | ORAL | Status: DC

## 2015-07-06 NOTE — Progress Notes (Signed)
Subjective:  Patient ID: Danielle Frazier, female    DOB: [birth year]  Age: 82 y.o. MRN: [id number]  CC: Annual Exam; Hyperlipidemia; Hypertension; Diabetes; and Medicare Wellness   HPI [name] presents for a CPX.  She tells me her blood pressure iss well controlled on a combination of lisinopril, hydrochlorothiazide, and Bystolic. She has had no episodes of headache, blurred vision, chest pain, shortness of breath, edema, or palpitations.  She is taking a proton pump inhibitor for GERD, she is concerned about the renal side effects and would like for me to prescribe something for heartburn that doesn't affect her kidneys.  She tells me her blood sugars are well-controlled, she has a rare episode of polyuria but she denies polydipsia, and polyphagia.  Outpatient Prescriptions Prior to Visit  Medication Sig Dispense Refill  . allopurinol (ZYLOPRIM) 300 MG tablet TAKE 1/2 TABLETS (150 MG TOTAL) BY MOUTH DAILY. 45 tablet 1  . Blood Glucose Monitoring Suppl (BAYER CONTOUR MONITOR) W/DEVICE KIT 1 Act by Does not apply route 2 (two) times daily. 100 kit 0  . BYSTOLIC 5 MG tablet TAKE 1 TABLET BY MOUTH DAILY 90 tablet 1  . darifenacin (ENABLEX) 15 MG 24 hr tablet Take 1 tablet (15 mg total) by mouth daily. 90 tablet 3  . glucose blood (BAYER CONTOUR TEST) test strip Use BID 100 each 11  . Linaclotide (LINZESS) 145 MCG CAPS capsule Take 1 capsule (145 mcg total) by mouth daily. 35 capsule 0  . lisinopril-hydrochlorothiazide (PRINZIDE,ZESTORETIC) 20-25 MG tablet TAKE 1 TABLET BY MOUTH DAILY. 90 tablet 1  . Multiple Vitamins-Minerals (CENTRUM SILVER PO) Take 1 tablet by mouth daily.      . clonazePAM (KLONOPIN) 0.5 MG tablet TAKE 1 TABLET BY MOUTH TWICE A DAY 60 tablet 3  . omeprazole (PRILOSEC OTC) 20 MG tablet Take 20 mg by mouth daily.       No facility-administered medications prior to visit.    ROS Review of Systems  Constitutional: Negative.  Negative for fever, chills, diaphoresis,  appetite change and fatigue.  HENT: Negative.   Eyes: Negative.   Respiratory: Negative.  Negative for cough, choking, chest tightness, shortness of breath and stridor.   Cardiovascular: Negative.  Negative for chest pain, palpitations and leg swelling.  Gastrointestinal: Positive for constipation. Negative for nausea, vomiting, abdominal pain and diarrhea.  Endocrine: Positive for polyuria. Negative for polydipsia and polyphagia.  Genitourinary: Negative.  Negative for dysuria, urgency, frequency, decreased urine volume and difficulty urinating.  Musculoskeletal: Positive for back pain. Negative for myalgias, joint swelling and arthralgias.       She has chronic, unchanged back pain.  Skin: Negative.  Negative for color change and rash.  Allergic/Immunologic: Negative.   Neurological: Negative.   Hematological: Negative.  Negative for adenopathy. Does not bruise/bleed easily.  Psychiatric/Behavioral: Positive for sleep disturbance. Negative for suicidal ideas, confusion, self-injury, dysphoric mood and decreased concentration. The patient is nervous/anxious.     Objective:  BP 138/84 mmHg  Pulse 62  Temp(Src) 98.8 F (37.1 C) (Oral)  Resp 16  Ht 5' 6" (1.676 m)  Wt 203 lb (92.08 kg)  BMI 32.78 kg/m2  SpO2 93%  BP Readings from Last 3 Encounters:  07/06/15 138/84  01/01/15 136/88  07/02/14 118/68    Wt Readings from Last 3 Encounters:  07/06/15 203 lb (92.08 kg)  01/01/15 208 lb (94.348 kg)  07/02/14 210 lb (95.255 kg)    Physical Exam  Constitutional: She is oriented to person, place, and  time. She appears well-developed and well-nourished.  Non-toxic appearance. She does not have a sickly appearance. She does not appear ill. No distress.  HENT:  Mouth/Throat: Oropharynx is clear and moist.  Eyes: Conjunctivae are normal. Right eye exhibits no discharge. Left eye exhibits no discharge. No scleral icterus.  Neck: Normal range of motion. Neck supple. No JVD present. No  tracheal deviation present. No thyromegaly present.  Cardiovascular: Normal rate, regular rhythm, normal heart sounds and intact distal pulses.  Exam reveals no gallop and no friction rub.   No murmur heard. Pulmonary/Chest: Effort normal and breath sounds normal. No stridor. No respiratory distress. She has no decreased breath sounds. She has no wheezes. She has no rhonchi. She has no rales. She exhibits no tenderness.  Abdominal: Soft. Bowel sounds are normal. She exhibits no distension and no mass. There is no tenderness. There is no rebound and no guarding.  Genitourinary: No breast swelling, tenderness, discharge or bleeding. Pelvic exam was performed with patient supine.  GU and rectal exam was deferred at her request  Musculoskeletal: Normal range of motion. She exhibits no edema or tenderness.  Lymphadenopathy:    She has no cervical adenopathy.  Neurological: She is oriented to person, place, and time.  Skin: Skin is warm and dry. No rash noted. She is not diaphoretic. No erythema. No pallor.  Vitals reviewed.   Lab Results  Component Value Date   WBC 7.8 07/06/2015   HGB 13.7 07/06/2015   HCT 41.3 07/06/2015   PLT 210.0 07/06/2015   GLUCOSE 138* 07/06/2015   CHOL 158 07/06/2015   TRIG 209.0* 07/06/2015   HDL 39.80 07/06/2015   LDLDIRECT 90.0 07/06/2015   LDLCALC 75 07/02/2014   ALT 28 07/06/2015   AST 27 07/06/2015   NA 143 07/06/2015   K 3.8 07/06/2015   CL 105 07/06/2015   CREATININE 2.72* 07/06/2015   BUN 41* 07/06/2015   CO2 29 07/06/2015   TSH 3.13 07/06/2015   HGBA1C 6.3 07/06/2015   MICROALBUR 101.9* 07/06/2015    No results found.  Assessment & Plan:   Catie was seen today for annual exam, hyperlipidemia, hypertension, diabetes and medicare wellness.  Diagnoses and all orders for this visit:  Essential hypertension- her blood pressures well controlled, electrolytes and renal function are stable. -     Comprehensive metabolic panel;  Future  Constipation, chronic- her labs show no secondary metabolic causes for constipation, improvement noted. -     Comprehensive metabolic panel; Future -     T4, free; Future -     TSH; Future  Type 2 diabetes mellitus with diabetic nephropathy, without long-term current use of insulin ([place])- her blood sugars are well-controlled, no therapy is needed at this time. -     Hemoglobin A1c; Future -     Microalbumin / creatinine urine ratio; Future  Hyperlipidemia with target LDL less than 100- she has achieved her LDL goal -     Lipid panel; Future  GOUT- her uric acid level is nearly at the goal of 6, she has had no recent outbreaks of gout, will continue allopurinol -     Uric acid; Future  Chronic renal insufficiency, stage III (moderate)- her renal function is stable, she will avoid nephrotoxic agents, will continue to maintain good blood pressure control -     Comprehensive metabolic panel; Future -     CBC with Differential/Platelet; Future -     Urinalysis, Routine w reflex microscopic (not at [facility]); Future  Bilateral renal cysts -     Urinalysis, Routine w reflex microscopic (not at [facility]); Future  Gastroesophageal reflux disease without esophagitis- I will stop the proton pump inhibitor at her request and will treat the heartburn with an H2 blocker -     ranitidine (ZANTAC) 300 MG tablet; Take 1 tablet (300 mg total) by mouth at bedtime.  Need for 23-polyvalent pneumococcal polysaccharide vaccine -     Pneumococcal polysaccharide vaccine 23-valent greater than or equal to 2yo subcutaneous/IM  Other orders -     clonazePAM (KLONOPIN) 0.5 MG tablet; Take 1 tablet (0.5 mg total) by mouth 2 (two) times daily.  I have discontinued Ms. Daquila's omeprazole. I have also changed her clonazePAM. Additionally, I am having her start on ranitidine. Lastly, I am having her maintain her Multiple Vitamins-Minerals (CENTRUM SILVER PO), BAYER CONTOUR MONITOR, glucose blood, linaclotide,  darifenacin, BYSTOLIC, allopurinol, and lisinopril-hydrochlorothiazide.  Meds ordered this encounter  Medications  . clonazePAM (KLONOPIN) 0.5 MG tablet    Sig: Take 1 tablet (0.5 mg total) by mouth 2 (two) times daily.    Dispense:  60 tablet    Refill:  5    This request is for a new prescription for a controlled substance as required by Federal/State law.  . ranitidine (ZANTAC) 300 MG tablet    Sig: Take 1 tablet (300 mg total) by mouth at bedtime.    Dispense:  90 tablet    Refill:  3   See AVS for instructions about healthy living and anticipatory guidance.  Follow-up: Return in about 6 months (around 01/05/2016).  [name], MD

## 2015-07-06 NOTE — Progress Notes (Signed)
Pre visit review using our clinic review tool, if applicable. No additional management support is needed unless otherwise documented below in the visit note. 

## 2015-07-06 NOTE — Assessment & Plan Note (Signed)

## 2015-07-06 NOTE — Patient Instructions (Addendum)
Danielle Frazier , Thank you for taking time to come for your Medicare Wellness Visit. I appreciate your ongoing commitment to your health goals. Please review the following plan we discussed and let me know if I can assist you in the future.   These are the goals we discussed: Will keep up exercise for tone and ROM in shoulders with the bands as instructed;  Continue to walk; short and brief;  Goals    None      This is a list of the screening recommended for you and due dates:  Health Maintenance  Topic Date Due  . Shingles Vaccine  03/01/1993  . Complete foot exam   01/01/2015  . Eye exam for diabetics  03/15/2015  . Hemoglobin A1C  07/02/2015  . Flu Shot  10/13/2015  . Tetanus Vaccine  08/26/2018  . DEXA scan (bone density measurement)  Completed  . Pneumonia vaccines  Completed    '   Preventive Care for Adults, Female A healthy lifestyle and preventive care can promote health and wellness. Preventive health guidelines for women include the following key practices.  A routine yearly physical is a good way to check with your health care provider about your health and preventive screening. It is a chance to share any concerns and updates on your health and to receive a thorough exam.  Visit your dentist for a routine exam and preventive care every 6 months. Brush your teeth twice a day and floss once a day. Good oral hygiene prevents tooth decay and gum disease.  The frequency of eye exams is based on your age, health, family medical history, use of contact lenses, and other factors. Follow your health care provider's recommendations for frequency of eye exams.  Eat a healthy diet. Foods like vegetables, fruits, whole grains, low-fat dairy products, and lean protein foods contain the nutrients you need without too many calories. Decrease your intake of foods high in solid fats, added sugars, and salt. Eat the right amount of calories for you.Get information about a proper diet from  your health care provider, if necessary.  Regular physical exercise is one of the most important things you can do for your health. Most adults should get at least 150 minutes of moderate-intensity exercise (any activity that increases your heart rate and causes you to sweat) each week. In addition, most adults need muscle-strengthening exercises on 2 or more days a week.  Maintain a healthy weight. The body mass index (BMI) is a screening tool to identify possible weight problems. It provides an estimate of body fat based on height and weight. Your health care provider can find your BMI and can help you achieve or maintain a healthy weight.For adults 20 years and older:  A BMI below 18.5 is considered underweight.  A BMI of 18.5 to 24.9 is normal.  A BMI of 25 to 29.9 is considered overweight.  A BMI of 30 and above is considered obese.  Maintain normal blood lipids and cholesterol levels by exercising and minimizing your intake of saturated fat. Eat a balanced diet with plenty of fruit and vegetables. Blood tests for lipids and cholesterol should begin at age 53 and be repeated every 5 years. If your lipid or cholesterol levels are high, you are over 50, or you are at high risk for heart disease, you may need your cholesterol levels checked more frequently.Ongoing high lipid and cholesterol levels should be treated with medicines if diet and exercise are not working.  If  you smoke, find out from your health care provider how to quit. If you do not use tobacco, do not start.  Lung cancer screening is recommended for adults aged 52-80 years who are at high risk for developing lung cancer because of a history of smoking. A yearly low-dose CT scan of the lungs is recommended for people who have at least a 30-pack-year history of smoking and are a current smoker or have quit within the past 15 years. A pack year of smoking is smoking an average of 1 pack of cigarettes a day for 1 year (for example:  1 pack a day for 30 years or 2 packs a day for 15 years). Yearly screening should continue until the smoker has stopped smoking for at least 15 years. Yearly screening should be stopped for people who develop a health problem that would prevent them from having lung cancer treatment.  If you are pregnant, do not drink alcohol. If you are breastfeeding, be very cautious about drinking alcohol. If you are not pregnant and choose to drink alcohol, do not have more than 1 drink per day. One drink is considered to be 12 ounces (355 mL) of beer, 5 ounces (148 mL) of wine, or 1.5 ounces (44 mL) of liquor.  Avoid use of street drugs. Do not share needles with anyone. Ask for help if you need support or instructions about stopping the use of drugs.  High blood pressure causes heart disease and increases the risk of stroke. Your blood pressure should be checked at least every 1 to 2 years. Ongoing high blood pressure should be treated with medicines if weight loss and exercise do not work.  If you are 60-2 years old, ask your health care provider if you should take aspirin to prevent strokes.  Diabetes screening is done by taking a blood sample to check your blood glucose level after you have not eaten for a certain period of time (fasting). If you are not overweight and you do not have risk factors for diabetes, you should be screened once every 3 years starting at age 66. If you are overweight or obese and you are 73-33 years of age, you should be screened for diabetes every year as part of your cardiovascular risk assessment.  Breast cancer screening is essential preventive care for women. You should practice "breast self-awareness." This means understanding the normal appearance and feel of your breasts and may include breast self-examination. Any changes detected, no matter how small, should be reported to a health care provider. Women in their 33s and 30s should have a clinical breast exam (CBE) by a health  care provider as part of a regular health exam every 1 to 3 years. After age 80, women should have a CBE every year. Starting at age 61, women should consider having a mammogram (breast X-ray test) every year. Women who have a family history of breast cancer should talk to their health care provider about genetic screening. Women at a high risk of breast cancer should talk to their health care providers about having an MRI and a mammogram every year.  Breast cancer gene (BRCA)-related cancer risk assessment is recommended for women who have family members with BRCA-related cancers. BRCA-related cancers include breast, ovarian, tubal, and peritoneal cancers. Having family members with these cancers may be associated with an increased risk for harmful changes (mutations) in the breast cancer genes BRCA1 and BRCA2. Results of the assessment will determine the need for genetic counseling and BRCA1  and BRCA2 testing.  Your health care provider may recommend that you be screened regularly for cancer of the pelvic organs (ovaries, uterus, and vagina). This screening involves a pelvic examination, including checking for microscopic changes to the surface of your cervix (Pap test). You may be encouraged to have this screening done every 3 years, beginning at age 27.  For women ages 22-65, health care providers may recommend pelvic exams and Pap testing every 3 years, or they may recommend the Pap and pelvic exam, combined with testing for human papilloma virus (HPV), every 5 years. Some types of HPV increase your risk of cervical cancer. Testing for HPV may also be done on women of any age with unclear Pap test results.  Other health care providers may not recommend any screening for nonpregnant women who are considered low risk for pelvic cancer and who do not have symptoms. Ask your health care provider if a screening pelvic exam is right for you.  If you have had past treatment for cervical cancer or a condition  that could lead to cancer, you need Pap tests and screening for cancer for at least 20 years after your treatment. If Pap tests have been discontinued, your risk factors (such as having a new sexual partner) need to be reassessed to determine if screening should resume. Some women have medical problems that increase the chance of getting cervical cancer. In these cases, your health care provider may recommend more frequent screening and Pap tests.  Colorectal cancer can be detected and often prevented. Most routine colorectal cancer screening begins at the age of 27 years and continues through age 62 years. However, your health care provider may recommend screening at an earlier age if you have risk factors for colon cancer. On a yearly basis, your health care provider may provide home test kits to check for hidden blood in the stool. Use of a small camera at the end of a tube, to directly examine the colon (sigmoidoscopy or colonoscopy), can detect the earliest forms of colorectal cancer. Talk to your health care provider about this at age 32, when routine screening begins. Direct exam of the colon should be repeated every 5-10 years through age 30 years, unless early forms of precancerous polyps or small growths are found.  People who are at an increased risk for hepatitis B should be screened for this virus. You are considered at high risk for hepatitis B if:  You were born in a country where hepatitis B occurs often. Talk with your health care provider about which countries are considered high risk.  Your parents were born in a high-risk country and you have not received a shot to protect against hepatitis B (hepatitis B vaccine).  You have HIV or AIDS.  You use needles to inject street drugs.  You live with, or have sex with, someone who has hepatitis B.  You get hemodialysis treatment.  You take certain medicines for conditions like cancer, organ transplantation, and autoimmune  conditions.  Hepatitis C blood testing is recommended for all people born from [birth year] through 1965 and any individual with known risks for hepatitis C.  Practice safe sex. Use condoms and avoid high-risk sexual practices to reduce the spread of sexually transmitted infections (STIs). STIs include gonorrhea, chlamydia, syphilis, trichomonas, herpes, HPV, and human immunodeficiency virus (HIV). Herpes, HIV, and HPV are viral illnesses that have no cure. They can result in disability, cancer, and death.  You should be screened for sexually transmitted illnesses (STIs)  including gonorrhea and chlamydia if:  You are sexually active and are younger than 24 years.  You are older than 24 years and your health care provider tells you that you are at risk for this type of infection.  Your sexual activity has changed since you were last screened and you are at an increased risk for chlamydia or gonorrhea. Ask your health care provider if you are at risk.  If you are at risk of being infected with HIV, it is recommended that you take a prescription medicine daily to prevent HIV infection. This is called preexposure prophylaxis (PrEP). You are considered at risk if:  You are sexually active and do not regularly use condoms or know the HIV status of your partner(s).  You take drugs by injection.  You are sexually active with a partner who has HIV.  Talk with your health care provider about whether you are at high risk of being infected with HIV. If you choose to begin PrEP, you should first be tested for HIV. You should then be tested every 3 months for as long as you are taking PrEP.  Osteoporosis is a disease in which the bones lose minerals and strength with aging. This can result in serious bone fractures or breaks. The risk of osteoporosis can be identified using a bone density scan. Women ages 68 years and over and women at risk for fractures or osteoporosis should discuss screening with their health  care providers. Ask your health care provider whether you should take a calcium supplement or vitamin D to reduce the rate of osteoporosis.  Menopause can be associated with physical symptoms and risks. Hormone replacement therapy is available to decrease symptoms and risks. You should talk to your health care provider about whether hormone replacement therapy is right for you.  Use sunscreen. Apply sunscreen liberally and repeatedly throughout the day. You should seek shade when your shadow is shorter than you. Protect yourself by wearing long sleeves, pants, a wide-brimmed hat, and sunglasses year round, whenever you are outdoors.  Once a month, do a whole body skin exam, using a mirror to look at the skin on your back. Tell your health care provider of new moles, moles that have irregular borders, moles that are larger than a pencil eraser, or moles that have changed in shape or color.  Stay current with required vaccines (immunizations).  Influenza vaccine. All adults should be immunized every year.  Tetanus, diphtheria, and acellular pertussis (Td, Tdap) vaccine. Pregnant women should receive 1 dose of Tdap vaccine during each pregnancy. The dose should be obtained regardless of the length of time since the last dose. Immunization is preferred during the 27th-36th week of gestation. An adult who has not previously received Tdap or who does not know her vaccine status should receive 1 dose of Tdap. This initial dose should be followed by tetanus and diphtheria toxoids (Td) booster doses every 10 years. Adults with an unknown or incomplete history of completing a 3-dose immunization series with Td-containing vaccines should begin or complete a primary immunization series including a Tdap dose. Adults should receive a Td booster every 10 years.  Varicella vaccine. An adult without evidence of immunity to varicella should receive 2 doses or a second dose if she has previously received 1 dose. Pregnant  females who do not have evidence of immunity should receive the first dose after pregnancy. This first dose should be obtained before leaving the health care facility. The second dose should be obtained  4-8 weeks after the first dose.  Human papillomavirus (HPV) vaccine. Females aged 13-26 years who have not received the vaccine previously should obtain the 3-dose series. The vaccine is not recommended for use in pregnant females. However, pregnancy testing is not needed before receiving a dose. If a female is found to be pregnant after receiving a dose, no treatment is needed. In that case, the remaining doses should be delayed until after the pregnancy. Immunization is recommended for any person with an immunocompromised condition through the age of 62 years if she did not get any or all doses earlier. During the 3-dose series, the second dose should be obtained 4-8 weeks after the first dose. The third dose should be obtained 24 weeks after the first dose and 16 weeks after the second dose.  Zoster vaccine. One dose is recommended for adults aged 59 years or older unless certain conditions are present.  Measles, mumps, and rubella (MMR) vaccine. Adults born before 40 generally are considered immune to measles and mumps. Adults born in [birth year] or later should have 1 or more doses of MMR vaccine unless there is a contraindication to the vaccine or there is laboratory evidence of immunity to each of the three diseases. A routine second dose of MMR vaccine should be obtained at least 28 days after the first dose for students attending postsecondary schools, health care workers, or international travelers. People who received inactivated measles vaccine or an unknown type of measles vaccine during 1963-1967 should receive 2 doses of MMR vaccine. People who received inactivated mumps vaccine or an unknown type of mumps vaccine before 1979 and are at high risk for mumps infection should consider immunization with 2  doses of MMR vaccine. For females of childbearing age, rubella immunity should be determined. If there is no evidence of immunity, females who are not pregnant should be vaccinated. If there is no evidence of immunity, females who are pregnant should delay immunization until after pregnancy. Unvaccinated health care workers born before [birth year] who lack laboratory evidence of measles, mumps, or rubella immunity or laboratory confirmation of disease should consider measles and mumps immunization with 2 doses of MMR vaccine or rubella immunization with 1 dose of MMR vaccine.  Pneumococcal 13-valent conjugate (PCV13) vaccine. When indicated, a person who is uncertain of his immunization history and has no record of immunization should receive the PCV13 vaccine. All adults 72 years of age and older should receive this vaccine. An adult aged 1 years or older who has certain medical conditions and has not been previously immunized should receive 1 dose of PCV13 vaccine. This PCV13 should be followed with a dose of pneumococcal polysaccharide (PPSV23) vaccine. Adults who are at high risk for pneumococcal disease should obtain the PPSV23 vaccine at least 8 weeks after the dose of PCV13 vaccine. Adults older than 80 years of age who have normal immune system function should obtain the PPSV23 vaccine dose at least 1 year after the dose of PCV13 vaccine.  Pneumococcal polysaccharide (PPSV23) vaccine. When PCV13 is also indicated, PCV13 should be obtained first. All adults aged 31 years and older should be immunized. An adult younger than age 88 years who has certain medical conditions should be immunized. Any person who resides in a nursing home or long-term care facility should be immunized. An adult smoker should be immunized. People with an immunocompromised condition and certain other conditions should receive both PCV13 and PPSV23 vaccines. People with human immunodeficiency virus (HIV) infection should be immunized as  soon as possible after diagnosis. Immunization during chemotherapy or radiation therapy should be avoided. Routine use of PPSV23 vaccine is not recommended for American Indians, [place] Natives, or people younger than 65 years unless there are medical conditions that require PPSV23 vaccine. When indicated, people who have unknown immunization and have no record of immunization should receive PPSV23 vaccine. One-time revaccination 5 years after the first dose of PPSV23 is recommended for people aged 19-64 years who have chronic kidney failure, nephrotic syndrome, asplenia, or immunocompromised conditions. People who received 1-2 doses of PPSV23 before age 14 years should receive another dose of PPSV23 vaccine at age 23 years or later if at least 5 years have passed since the previous dose. Doses of PPSV23 are not needed for people immunized with PPSV23 at or after age 75 years.  Meningococcal vaccine. Adults with asplenia or persistent complement component deficiencies should receive 2 doses of quadrivalent meningococcal conjugate (MenACWY-D) vaccine. The doses should be obtained at least 2 months apart. Microbiologists working with certain meningococcal bacteria, [employer] recruits, people at risk during an outbreak, and people who travel to or live in countries with a high rate of meningitis should be immunized. A first-year college student up through age 17 years who is living in a residence hall should receive a dose if she did not receive a dose on or after her 16th birthday. Adults who have certain high-risk conditions should receive one or more doses of vaccine.  Hepatitis A vaccine. Adults who wish to be protected from this disease, have certain high-risk conditions, work with hepatitis A-infected animals, work in hepatitis A research labs, or travel to or work in countries with a high rate of hepatitis A should be immunized. Adults who were previously unvaccinated and who anticipate close contact with an  international adoptee during the first 60 days after arrival in the [place] from a country with a high rate of hepatitis A should be immunized.  Hepatitis B vaccine. Adults who wish to be protected from this disease, have certain high-risk conditions, may be exposed to blood or other infectious body fluids, are household contacts or sex partners of hepatitis B positive people, are clients or workers in certain care facilities, or travel to or work in countries with a high rate of hepatitis B should be immunized.  Haemophilus influenzae type b (Hib) vaccine. A previously unvaccinated person with asplenia or sickle cell disease or having a scheduled splenectomy should receive 1 dose of Hib vaccine. Regardless of previous immunization, a recipient of a hematopoietic stem cell transplant should receive a 3-dose series 6-12 months after her successful transplant. Hib vaccine is not recommended for adults with HIV infection. Preventive Services / Frequency Ages 18 to 20 years  Blood pressure check.** / Every 3-5 years.  Lipid and cholesterol check.** / Every 5 years beginning at age 37.  Clinical breast exam.** / Every 3 years for women in their 59s and 51s.  BRCA-related cancer risk assessment.** / For women who have family members with a BRCA-related cancer (breast, ovarian, tubal, or peritoneal cancers).  Pap test.** / Every 2 years from ages 71 through 58. Every 3 years starting at age 1 through age 39 or 57 with a history of 3 consecutive normal Pap tests.  HPV screening.** / Every 3 years from ages 67 through ages 75 to 67 with a history of 3 consecutive normal Pap tests.  Hepatitis C blood test.** / For any individual with known risks for hepatitis C.  Skin self-exam. /  Monthly.  Influenza vaccine. / Every year.  Tetanus, diphtheria, and acellular pertussis (Tdap, Td) vaccine.** / Consult your health care provider. Pregnant women should receive 1 dose of Tdap vaccine during each  pregnancy. 1 dose of Td every 10 years.  Varicella vaccine.** / Consult your health care provider. Pregnant females who do not have evidence of immunity should receive the first dose after pregnancy.  HPV vaccine. / 3 doses over 6 months, if 26 and younger. The vaccine is not recommended for use in pregnant females. However, pregnancy testing is not needed before receiving a dose.  Measles, mumps, rubella (MMR) vaccine.** / You need at least 1 dose of MMR if you were born in 1957 or later. You may also need a 2nd dose. For females of childbearing age, rubella immunity should be determined. If there is no evidence of immunity, females who are not pregnant should be vaccinated. If there is no evidence of immunity, females who are pregnant should delay immunization until after pregnancy.  Pneumococcal 13-valent conjugate (PCV13) vaccine.** / Consult your health care provider.  Pneumococcal polysaccharide (PPSV23) vaccine.** / 1 to 2 doses if you smoke cigarettes or if you have certain conditions.  Meningococcal vaccine.** / 1 dose if you are age 19 to 21 years and a first-year college student living in a residence hall, or have one of several medical conditions, you need to get vaccinated against meningococcal disease. You may also need additional booster doses.  Hepatitis A vaccine.** / Consult your health care provider.  Hepatitis B vaccine.** / Consult your health care provider.  Haemophilus influenzae type b (Hib) vaccine.** / Consult your health care provider. Ages 40 to 64 years  Blood pressure check.** / Every year.  Lipid and cholesterol check.** / Every 5 years beginning at age 20 years.  Lung cancer screening. / Every year if you are aged 55-80 years and have a 30-pack-year history of smoking and currently smoke or have quit within the past 15 years. Yearly screening is stopped once you have quit smoking for at least 15 years or develop a health problem that would prevent you from  having lung cancer treatment.  Clinical breast exam.** / Every year after age 40 years.  BRCA-related cancer risk assessment.** / For women who have family members with a BRCA-related cancer (breast, ovarian, tubal, or peritoneal cancers).  Mammogram.** / Every year beginning at age 40 years and continuing for as long as you are in good health. Consult with your health care provider.  Pap test.** / Every 3 years starting at age 30 years through age 65 or 70 years with a history of 3 consecutive normal Pap tests.  HPV screening.** / Every 3 years from ages 30 years through ages 65 to 70 years with a history of 3 consecutive normal Pap tests.  Fecal occult blood test (FOBT) of stool. / Every year beginning at age 50 years and continuing until age 75 years. You may not need to do this test if you get a colonoscopy every 10 years.  Flexible sigmoidoscopy or colonoscopy.** / Every 5 years for a flexible sigmoidoscopy or every 10 years for a colonoscopy beginning at age 50 years and continuing until age 75 years.  Hepatitis C blood test.** / For all people born from 1945 through 1965 and any individual with known risks for hepatitis C.  Skin self-exam. / Monthly.  Influenza vaccine. / Every year.  Tetanus, diphtheria, and acellular pertussis (Tdap/Td) vaccine.** / Consult your health care provider. Pregnant   women should receive 1 dose of Tdap vaccine during each pregnancy. 1 dose of Td every 10 years.  Varicella vaccine.** / Consult your health care provider. Pregnant females who do not have evidence of immunity should receive the first dose after pregnancy.  Zoster vaccine.** / 1 dose for adults aged 26 years or older.  Measles, mumps, rubella (MMR) vaccine.** / You need at least 1 dose of MMR if you were born in 1957 or later. You may also need a second dose. For females of childbearing age, rubella immunity should be determined. If there is no evidence of immunity, females who are not  pregnant should be vaccinated. If there is no evidence of immunity, females who are pregnant should delay immunization until after pregnancy.  Pneumococcal 13-valent conjugate (PCV13) vaccine.** / Consult your health care provider.  Pneumococcal polysaccharide (PPSV23) vaccine.** / 1 to 2 doses if you smoke cigarettes or if you have certain conditions.  Meningococcal vaccine.** / Consult your health care provider.  Hepatitis A vaccine.** / Consult your health care provider.  Hepatitis B vaccine.** / Consult your health care provider.  Haemophilus influenzae type b (Hib) vaccine.** / Consult your health care provider. Ages 51 years and over  Blood pressure check.** / Every year.  Lipid and cholesterol check.** / Every 5 years beginning at age 31 years.  Lung cancer screening. / Every year if you are aged 19-80 years and have a 30-pack-year history of smoking and currently smoke or have quit within the past 15 years. Yearly screening is stopped once you have quit smoking for at least 15 years or develop a health problem that would prevent you from having lung cancer treatment.  Clinical breast exam.** / Every year after age 38 years.  BRCA-related cancer risk assessment.** / For women who have family members with a BRCA-related cancer (breast, ovarian, tubal, or peritoneal cancers).  Mammogram.** / Every year beginning at age 62 years and continuing for as long as you are in good health. Consult with your health care provider.  Pap test.** / Every 3 years starting at age 21 years through age 65 or 6 years with 3 consecutive normal Pap tests. Testing can be stopped between 65 and 70 years with 3 consecutive normal Pap tests and no abnormal Pap or HPV tests in the past 10 years.  HPV screening.** / Every 3 years from ages 55 years through ages 75 or 12 years with a history of 3 consecutive normal Pap tests. Testing can be stopped between 65 and 70 years with 3 consecutive normal Pap tests  and no abnormal Pap or HPV tests in the past 10 years.  Fecal occult blood test (FOBT) of stool. / Every year beginning at age 75 years and continuing until age 54 years. You may not need to do this test if you get a colonoscopy every 10 years.  Flexible sigmoidoscopy or colonoscopy.** / Every 5 years for a flexible sigmoidoscopy or every 10 years for a colonoscopy beginning at age 69 years and continuing until age 33 years.  Hepatitis C blood test.** / For all people born from [birth year] through 1965 and any individual with known risks for hepatitis C.  Osteoporosis screening.** / A one-time screening for women ages 67 years and over and women at risk for fractures or osteoporosis.  Skin self-exam. / Monthly.  Influenza vaccine. / Every year.  Tetanus, diphtheria, and acellular pertussis (Tdap/Td) vaccine.** / 1 dose of Td every 10 years.  Varicella vaccine.** / Consult your  health care provider.  Zoster vaccine.** / 1 dose for adults aged 73 years or older.  Pneumococcal 13-valent conjugate (PCV13) vaccine.** / Consult your health care provider.  Pneumococcal polysaccharide (PPSV23) vaccine.** / 1 dose for all adults aged 76 years and older.  Meningococcal vaccine.** / Consult your health care provider.  Hepatitis A vaccine.** / Consult your health care provider.  Hepatitis B vaccine.** / Consult your health care provider.  Haemophilus influenzae type b (Hib) vaccine.** / Consult your health care provider. ** Family history and personal history of risk and conditions may change your health care provider's recommendations.   This information is not intended to replace advice given to you by your health care provider. Make sure you discuss any questions you have with your health care provider.   Document Released: 04/26/2001 Document Revised: 03/21/2014 Document Reviewed: 07/26/2010 Elsevier Interactive Patient Education 2016 [place] Prevention in the Home  Falls can  cause injuries and can affect people from all age groups. There are many simple things that you can do to make your home safe and to help prevent falls. WHAT CAN I DO ON THE OUTSIDE OF MY HOME?  Regularly repair the edges of walkways and driveways and fix any cracks.  Remove high doorway thresholds.  Trim any shrubbery on the main path into your home.  Use bright outdoor lighting.  Clear walkways of debris and clutter, including tools and rocks.  Regularly check that handrails are securely fastened and in good repair. Both sides of any steps should have handrails.  Install guardrails along the edges of any raised decks or porches.  Have leaves, snow, and ice cleared regularly.  Use sand or salt on walkways during winter months.  In the garage, clean up any spills right away, including grease or oil spills. WHAT CAN I DO IN THE BATHROOM?  Use night lights.  Install grab bars by the toilet and in the tub and shower. Do not use towel bars as grab bars.  Use non-skid mats or decals on the floor of the tub or shower.  If you need to sit down while you are in the shower, use a plastic, non-slip stool.Marland Kitchen  Keep the floor dry. Immediately clean up any water that spills on the floor.  Remove soap buildup in the tub or shower on a regular basis.  Attach bath mats securely with double-sided non-slip rug tape.  Remove throw rugs and other tripping hazards from the floor. WHAT CAN I DO IN THE BEDROOM?  Use night lights.  Make sure that a bedside light is easy to reach.  Do not use oversized bedding that drapes onto the floor.  Have a firm chair that has side arms to use for getting dressed.  Remove throw rugs and other tripping hazards from the floor. WHAT CAN I DO IN THE KITCHEN?   Clean up any spills right away.  Avoid walking on wet floors.  Place frequently used items in easy-to-reach places.  If you need to reach for something above you, use a sturdy step stool that  has a grab bar.  Keep electrical cables out of the way.  Do not use floor polish or wax that makes floors slippery. If you have to use wax, make sure that it is non-skid floor wax.  Remove throw rugs and other tripping hazards from the floor. WHAT CAN I DO IN THE STAIRWAYS?  Do not leave any items on the stairs.  Make sure that there are  handrails on both sides of the stairs. Fix handrails that are broken or loose. Make sure that handrails are as long as the stairways.  Check any carpeting to make sure that it is firmly attached to the stairs. Fix any carpet that is loose or worn.  Avoid having throw rugs at the top or bottom of stairways, or secure the rugs with carpet tape to prevent them from moving.  Make sure that you have a light switch at the top of the stairs and the bottom of the stairs. If you do not have them, have them installed. WHAT ARE SOME OTHER FALL PREVENTION TIPS?  Wear closed-toe shoes that fit well and support your feet. Wear shoes that have rubber soles or low heels.  When you use a stepladder, make sure that it is completely opened and that the sides are firmly locked. Have someone hold the ladder while you are using it. Do not climb a closed stepladder.  Add color or contrast paint or tape to grab bars and handrails in your home. Place contrasting color strips on the first and last steps.  Use mobility aids as needed, such as canes, walkers, scooters, and crutches.  Turn on lights if it is dark. Replace any light bulbs that burn out.  Set up furniture so that there are clear paths. Keep the furniture in the same spot.  Fix any uneven floor surfaces.  Choose a carpet design that does not hide the edge of steps of a stairway.  Be aware of any and all pets.  Review your medicines with your healthcare provider. Some medicines can cause dizziness or changes in blood pressure, which increase your risk of falling. Talk with your health care provider about other  ways that you can decrease your risk of falls. This may include working with a physical therapist or trainer to improve your strength, balance, and endurance.   This information is not intended to replace advice given to you by your health care provider. Make sure you discuss any questions you have with your health care provider.   Document Released: 02/18/2002 Document Revised: 07/15/2014 Document Reviewed: 04/04/2014 Elsevier Interactive Patient Education [employer].

## 2015-07-06 NOTE — Progress Notes (Signed)
Subjective:   Danielle Frazier is a 82 y.o. female who presents for Medicare Annual (Subsequent) preventive examination.  Review of Systems:  HRA assessment completed during visit; [place]  The Patient was informed that this wellness visit is to identify risk and educate on how to reduce risk for increase disease through lifestyle changes.   ROS deferred to CPE exam with physician today Family and medical hx given below;   Risk Type 2 DM/ do not take any meds; rechecking labs today  Hyperlipidemia: Triglycerides slightly elevated; has snacks; not concerned with weight; Eats at home HTN medically managed /  CKD; understands to monitor new meds and low sodium diet Had shot for back pain; x 5 to 6 years ago  Tobacco; never smoked  Secondary smoke? no ETOH; no  Medication review/ Adherence/ but then stated she does not take med for over active bladder; only gets up x 1 at hs (9pm to 6am)   BMI: 3f 6in 203  Diet; eat 3 meals a day; Cereal and toast Lunch snack; sandwich Meal at hs; cooks meal; not many processed foods   Exercise; does not do much Walking; can't do as much; legs give out;  When she gets down, she can't get back up  Recommended walking in short intervals several times throughout the day and continue to use stretch bands at home for upper body   HOME SAFETY; lives in one level;  [place] near;  Age in place? (Long term plan) will stay as long as she can; No plan if she should have to move; suggested she look at long term communities and discuss with family any preferences   Removal of clutter clearing paths through the home,  No railing in the  bathroom;  Bathroom safety; Shower in tub; no issues; has sliding glass doors and may take them down; can get a shower; understands a shower transfer bench may be helpful but does not want one now.  [place] reviewed and will keep in a  safe place if these exist. Driving accidents; no; Wears a seatbelt Advised to use sun protection or large brim hat Not in the sun very much   Stressors (1-5) no stress verbalized   Depression: Denies feeling depressed or hopeless; voices pleasure in daily life Mood stable; no; tries to have an upbeat look   Cognitive; Presents with no issues; Engaged in assessment  Manages checkbook, medications; no failures of task Has issues with names or people; sometimes she can remember well;  Ad8 score reviewed for issues; . Issues making decisions; no . Less interest in hobbies / activities: no . Repeats questions, stories; family complaining: no . Trouble using ordinary gadgets; microwave; computer: no . Forgets the month or year: no . Mismanaging finances: no . Missing apt: no . Daily problems with thinking of memory; sometimes . AD8 Score 1  Patient presents with minimal issues; Abbreviated MMSE;  Recall was 3 for 3; could not subtract serial 7's from 100; but subtracted serial 3's from 20 x 5; copy of picture adequate; could write a sentence; Oriented to date and time x3  Copied picture;  *Could not show correct placement of hands on a clock if time were 3:35;  Fall assessment; no falls  Gait assessment appears normal; states she has limited dizziness in the sun; or under stress;   Mobilization and Functional losses from last year to this year? Last year  was easier to get up and down   Sleep pattern changes; lately sleeps well Hours of sleep on average? Goes to bed at 9am and sleeps until 6am  Urinary or fecal incontinence reviewed/ get up at hx x 1 but goes back to sleep  Not taking bladder medicine; doesn't feel she really needs it on most day unless traveling;   Counseling Health Maintenance Colonoscopy; 02/2003; no more needed  EKG: 06/2010  Mammogram 12/2014/ had one 2017; neg  Dexa 04/2011; not sure she has had one but no risk at this time   PAP; no more  Hearing:  '2000hz'  both ears   Ophthalmology exam; had one in March; "[facility]"  in [place].    Immunizations Due  Shingles; has not had one; but is checking at the drug store   Advanced Directive;  06/2013  Health Recommendations and Referrals To continue to do strengthening exercises every day in lieu of immobility being a threat to her remaining independent in her home.   Current Care Team reviewed and updated  Cardiac Risk Factors include: advanced age (>22mn, >>55women);diabetes mellitus;dyslipidemia;family history of premature cardiovascular disease;hypertension;microalbuminuria;sedentary lifestyle     Objective:     Vitals: BP 138/84 mmHg  Pulse 62  Temp(Src) 98.8 F (37.1 C) (Oral)  Resp 16  Ht '5\' 6"'  (1.676 m)  Wt 203 lb (92.08 kg)  BMI 32.78 kg/m2  SpO2 93%  Body mass index is 32.78 kg/(m^2).   Tobacco History  Smoking status  . Never Smoker   Smokeless tobacco  . Never Used     Counseling given: Not Answered   Past Medical History  Diagnosis Date  . Diabetes mellitus     type II  . Gout   . Hypertension   . LBP (low back pain)     foraminal narrowing at L4-5 and disc bulge at L5- S1 on MRI 6-08  . Renal insufficiency   . Urinary incontinence   . Hyperlipidemia    No past surgical history on file. Family History  Problem Relation Age of Onset  . Diabetes Other   . Hypertension Other    History  Sexual Activity  . Sexual Activity: Not Currently    Outpatient Encounter Prescriptions as of 07/06/2015  Medication Sig  . allopurinol (ZYLOPRIM) 300 MG tablet TAKE 1/2 TABLETS (150 MG TOTAL) BY MOUTH DAILY.  .Marland KitchenBlood Glucose Monitoring Suppl (BAYER CONTOUR MONITOR) W/DEVICE KIT 1 Act by Does not apply route 2 (two) times daily.  .Marland KitchenBYSTOLIC 5 MG tablet TAKE 1 TABLET BY MOUTH DAILY  . clonazePAM (KLONOPIN) 0.5 MG tablet Take 1 tablet (0.5 mg total) by mouth 2 (two) times daily.  .Marland Kitchendarifenacin (ENABLEX) 15 MG 24 hr tablet Take 1 tablet (15 mg total) by  mouth daily.  .Marland Kitchenglucose blood (BAYER CONTOUR TEST) test strip Use BID  . Linaclotide (LINZESS) 145 MCG CAPS capsule Take 1 capsule (145 mcg total) by mouth daily.  .Marland Kitchenlisinopril-hydrochlorothiazide (PRINZIDE,ZESTORETIC) 20-25 MG tablet TAKE 1 TABLET BY MOUTH DAILY.  . Multiple Vitamins-Minerals (CENTRUM SILVER PO) Take 1 tablet by mouth daily.    . [DISCONTINUED] clonazePAM (KLONOPIN) 0.5 MG tablet TAKE 1 TABLET BY MOUTH TWICE A DAY  . [DISCONTINUED] omeprazole (PRILOSEC OTC) 20 MG tablet Take 20 mg by mouth daily.    . ranitidine (ZANTAC) 300 MG tablet Take 1 tablet (300 mg total) by mouth at bedtime.   No facility-administered encounter medications on file as of 07/06/2015.    Activities of  Daily Living In your present state of health, do you have any difficulty performing the following activities: 07/06/2015  Hearing? N  Vision? N  Difficulty concentrating or making decisions? N  Walking or climbing stairs? Y  Dressing or bathing? N  Doing errands, shopping? N  Preparing Food and eating ? N  Using the Toilet? N  In the past six months, have you accidently leaked urine? N  Do you have problems with loss of bowel control? N  Managing your Medications? N  Managing your Finances? N  Housekeeping or managing your Housekeeping? N    Patient Care Team: [name], MD as PCP - General    Assessment:     Exercise Activities and Dietary recommendations Current Exercise Habits: Home exercise routine, Type of exercise: strength training/weights;walking, Time (Minutes): 20, Frequency (Times/Week): 3, Weekly Exercise (Minutes/Week): 60, Intensity: Mild, Exercise limited by: orthopedic condition(s)  Goals    . Exercise 150 minutes per week (moderate activity)     To do short, interval walks To continue to do resistance exercise with stretch bands as directed by PT.       Fall Risk Fall Risk  07/06/2015 07/06/2015 07/02/2014 07/04/2013 11/20/2012  Falls in the past year? No No No Yes No   Number falls in past yr: - - - 2 or more -   Depression Screen PHQ 2/9 Scores 07/06/2015 07/06/2015 07/02/2014 11/20/2012  PHQ - 2 Score 0 0 0 0     Cognitive Testing No flowsheet data found.  See note Ad8 score 1 with minor recall Failed clock test; otherwise normal   Immunization History  Administered Date(s) Administered  . Influenza Split 01/04/2012  . Influenza, High Dose Seasonal PF 01/01/2015  . Influenza,inj,Quad PF,36+ Mos 12/31/2013  . Influenza-Unspecified 12/12/2012  . Pneumococcal Conjugate-13 08/20/2013  . Pneumococcal Polysaccharide-23 08/25/2008  . Td 08/25/2008   Screening Tests Health Maintenance  Topic Date Due  . ZOSTAVAX  03/01/1993  . FOOT EXAM  01/01/2015  . OPHTHALMOLOGY EXAM  03/15/2015  . HEMOGLOBIN A1C  07/02/2015  . INFLUENZA VACCINE  10/13/2015  . TETANUS/TDAP  08/26/2018  . DEXA SCAN  Completed  . PNA vac Low Risk Adult  Completed      Plan:   Will continue to exercise;  Discussed risk of falls with decreased mobility and given information.   During the course of the visit the patient was educated and counseled about the following appropriate screening and preventive services:   Vaccines to include Pneumoccal, Influenza, Hepatitis B, Td, Zostavax, HCV/ updated per Dr. Ronnald Ramp  Will plan to take shingles at pharmacy  Electrocardiogram/ deferred  Cardiovascular Disease/ discussed; BP well managed; is comfortable at current weight; low sodium diet  Colorectal cancer screening; aged out  Bone density screening/ defers; no issues; no sure if she had Dexa in 2013;   Diabetes screening/ ongoing  Glaucoma screening/ recent eye exam neg   Mammography/ recent 2017 and neg  Nutrition counseling / low sodium discussed in lieu of CKD   Patient Instructions (the written plan) was given to the patient.   Wynetta Fines, RN  07/06/2015

## 2015-07-09 ENCOUNTER — Telehealth: Payer: Self-pay

## 2015-07-09 NOTE — Telephone Encounter (Signed)
Call to [facility] in [place]; [phone] and is faxing over last eye exam to [phone]

## 2015-07-15 NOTE — Telephone Encounter (Signed)
No eye report on chart; Called [facility] and will refax vision report to my attention. Last eye exam was 05/26/2015

## 2015-07-20 ENCOUNTER — Encounter: Payer: Self-pay | Admitting: Internal Medicine

## 2015-10-21 ENCOUNTER — Other Ambulatory Visit: Payer: Self-pay | Admitting: Internal Medicine

## 2015-11-17 ENCOUNTER — Other Ambulatory Visit: Payer: Self-pay | Admitting: Internal Medicine

## 2015-12-15 ENCOUNTER — Other Ambulatory Visit: Payer: Self-pay | Admitting: Internal Medicine

## 2016-01-05 ENCOUNTER — Encounter: Payer: Self-pay | Admitting: Internal Medicine

## 2016-01-05 ENCOUNTER — Ambulatory Visit (INDEPENDENT_AMBULATORY_CARE_PROVIDER_SITE_OTHER): Payer: Medicare Other | Admitting: Internal Medicine

## 2016-01-05 ENCOUNTER — Other Ambulatory Visit (INDEPENDENT_AMBULATORY_CARE_PROVIDER_SITE_OTHER): Payer: Medicare Other

## 2016-01-05 VITALS — BP 150/90 | HR 68 | Temp 98.3°F | Resp 16 | Ht 66.0 in | Wt 199.8 lb

## 2016-01-05 DIAGNOSIS — E1121 Type 2 diabetes mellitus with diabetic nephropathy: Secondary | ICD-10-CM

## 2016-01-05 DIAGNOSIS — Z23 Encounter for immunization: Secondary | ICD-10-CM

## 2016-01-05 DIAGNOSIS — I10 Essential (primary) hypertension: Secondary | ICD-10-CM | POA: Diagnosis not present

## 2016-01-05 DIAGNOSIS — E669 Obesity, unspecified: Secondary | ICD-10-CM

## 2016-01-05 LAB — CBC WITH DIFFERENTIAL/PLATELET
BASOS ABS: 0 10*3/uL (ref 0.0–0.1)
Basophils Relative: 0.6 % (ref 0.0–3.0)
EOS ABS: 0.3 10*3/uL (ref 0.0–0.7)
Eosinophils Relative: 3.7 % (ref 0.0–5.0)
HEMATOCRIT: 40.8 % (ref 36.0–46.0)
Hemoglobin: 13.4 g/dL (ref 12.0–15.0)
LYMPHS ABS: 2.5 10*3/uL (ref 0.7–4.0)
LYMPHS PCT: 30.4 % (ref 12.0–46.0)
MCHC: 32.9 g/dL (ref 30.0–36.0)
MCV: 97.3 fl (ref 78.0–100.0)
MONOS PCT: 6.1 % (ref 3.0–12.0)
Monocytes Absolute: 0.5 10*3/uL (ref 0.1–1.0)
NEUTROS ABS: 4.9 10*3/uL (ref 1.4–7.7)
NEUTROS PCT: 59.2 % (ref 43.0–77.0)
PLATELETS: 226 10*3/uL (ref 150.0–400.0)
RBC: 4.19 Mil/uL (ref 3.87–5.11)
RDW: 14.4 % (ref 11.5–15.5)
WBC: 8.2 10*3/uL (ref 4.0–10.5)

## 2016-01-05 LAB — BASIC METABOLIC PANEL
BUN: 39 mg/dL — ABNORMAL HIGH (ref 6–23)
CHLORIDE: 106 meq/L (ref 96–112)
CO2: 29 meq/L (ref 19–32)
Calcium: 10.4 mg/dL (ref 8.4–10.5)
Creatinine, Ser: 2.74 mg/dL — ABNORMAL HIGH (ref 0.40–1.20)
GFR: 17.59 mL/min — ABNORMAL LOW (ref >60.00)
Glucose, Bld: 126 mg/dL — ABNORMAL HIGH (ref 70–99)
POTASSIUM: 4 meq/L (ref 3.5–5.1)
Sodium: 143 mEq/L (ref 135–145)

## 2016-01-05 NOTE — Progress Notes (Signed)
Subjective:  Patient ID: Danielle Frazier, female    DOB: [birth year]  Age: 82 y.o. MRN: [id number]  CC: Hypertension and Diabetes   HPI Danielle Frazier presents for follow-up. She feels well and offers no complaints. She tells me her blood pressure and blood sugars are well-controlled at home. She has had no recent episodes of headache, blurred vision, chest pain, shortness of breath, palpitations, edema, or fatigue.  Outpatient Medications Prior to Visit  Medication Sig Dispense Refill  . allopurinol (ZYLOPRIM) 300 MG tablet TAKE 1/2 TABLETS (150 MG TOTAL) BY MOUTH DAILY. 45 tablet 1  . Blood Glucose Monitoring Suppl (BAYER CONTOUR MONITOR) W/DEVICE KIT 1 Act by Does not apply route 2 (two) times daily. 100 kit 0  . BYSTOLIC 5 MG tablet TAKE 1 TABLET BY MOUTH DAILY 90 tablet 1  . clonazePAM (KLONOPIN) 0.5 MG tablet Take 1 tablet (0.5 mg total) by mouth 2 (two) times daily. 60 tablet 5  . darifenacin (ENABLEX) 15 MG 24 hr tablet Take 1 tablet (15 mg total) by mouth daily. 90 tablet 3  . glucose blood (BAYER CONTOUR TEST) test strip Use BID 100 each 11  . Linaclotide (LINZESS) 145 MCG CAPS capsule Take 1 capsule (145 mcg total) by mouth daily. 35 capsule 0  . lisinopril-hydrochlorothiazide (PRINZIDE,ZESTORETIC) 20-25 MG tablet TAKE 1 TABLET BY MOUTH DAILY. 90 tablet 1  . Multiple Vitamins-Minerals (CENTRUM SILVER PO) Take 1 tablet by mouth daily.      . ranitidine (ZANTAC) 300 MG tablet Take 1 tablet (300 mg total) by mouth at bedtime. 90 tablet 3   No facility-administered medications prior to visit.     ROS Review of Systems  Constitutional: Negative.  Negative for activity change, appetite change, diaphoresis, fatigue and unexpected weight change.  HENT: Negative.   Eyes: Negative.  Negative for photophobia and visual disturbance.  Respiratory: Negative.  Negative for cough, choking, chest tightness, shortness of breath and stridor.   Cardiovascular: Negative.  Negative for chest pain,  palpitations and leg swelling.  Gastrointestinal: Negative.  Negative for abdominal pain, constipation, diarrhea, nausea and vomiting.  Endocrine: Negative for polydipsia, polyphagia and polyuria.  Genitourinary: Negative.  Negative for decreased urine volume, difficulty urinating, dysuria, hematuria and urgency.  Musculoskeletal: Negative for back pain, myalgias and neck pain.  Skin: Negative.  Negative for color change and rash.  Allergic/Immunologic: Negative.   Neurological: Negative.  Negative for dizziness, weakness and light-headedness.  Hematological: Negative.  Negative for adenopathy. Does not bruise/bleed easily.  Psychiatric/Behavioral: Negative.     Objective:  BP (!) 150/90 (BP Location: Left Arm, Patient Position: Sitting, Cuff Size: Large)   Pulse 68   Temp 98.3 F (36.8 C) (Oral)   Resp 16   Ht '5\' 6"'  (1.676 m)   Wt 199 lb 12 oz (90.6 kg)   SpO2 98%   BMI 32.24 kg/m   BP Readings from Last 3 Encounters:  01/05/16 (!) 150/90  07/06/15 138/84  01/01/15 136/88    Wt Readings from Last 3 Encounters:  01/05/16 199 lb 12 oz (90.6 kg)  07/06/15 203 lb (92.1 kg)  01/01/15 208 lb (94.3 kg)    Physical Exam  Constitutional: She is oriented to person, place, and time. No distress.  HENT:  Mouth/Throat: Oropharynx is clear and moist. No oropharyngeal exudate.  Eyes: Conjunctivae are normal. Right eye exhibits no discharge. Left eye exhibits no discharge. No scleral icterus.  Neck: Normal range of motion. Neck supple. No JVD present. No tracheal deviation  present. No thyromegaly present.  Cardiovascular: Normal rate, regular rhythm, normal heart sounds and intact distal pulses.  Exam reveals no gallop and no friction rub.   No murmur heard. Pulmonary/Chest: Effort normal and breath sounds normal. No stridor. No respiratory distress. She has no wheezes. She has no rales. She exhibits no tenderness.  Abdominal: Soft. Bowel sounds are normal. She exhibits no distension  and no mass. There is no tenderness. There is no rebound and no guarding.  Musculoskeletal: Normal range of motion. She exhibits no edema, tenderness or deformity.  Lymphadenopathy:    She has no cervical adenopathy.  Neurological: She is oriented to person, place, and time.  Skin: Skin is warm and dry. No rash noted. She is not diaphoretic. No erythema. No pallor.  Vitals reviewed.   Lab Results  Component Value Date   WBC 8.2 01/05/2016   HGB 13.4 01/05/2016   HCT 40.8 01/05/2016   PLT 226.0 01/05/2016   GLUCOSE 126 (H) 01/05/2016   CHOL 158 07/06/2015   TRIG 209.0 (H) 07/06/2015   HDL 39.80 07/06/2015   LDLDIRECT 90.0 07/06/2015   LDLCALC 75 07/02/2014   ALT 28 07/06/2015   AST 27 07/06/2015   NA 143 01/05/2016   K 4.0 01/05/2016   CL 106 01/05/2016   CREATININE 2.74 (H) 01/05/2016   BUN 39 (H) 01/05/2016   CO2 29 01/05/2016   TSH 3.13 07/06/2015   HGBA1C 6.3 07/06/2015   MICROALBUR 101.9 (H) 07/06/2015    No results found.  Assessment & Plan:   Danielle Frazier was seen today for hypertension and diabetes.  Diagnoses and all orders for this visit:  Need for prophylactic vaccination and inoculation against influenza -     Flu vaccine HIGH DOSE PF (Fluzone High dose)  Essential hypertension- her blood pressure is adequately well-controlled, electrolytes and renal function are stable.  Type 2 diabetes mellitus with diabetic nephropathy, without long-term current use of insulin (Danielle Frazier)- her blood sugars are adequately well-controlled -     CBC w/Diff; Future -     Basic Metabolic Panel (BMET); Future  Obesity (BMI 30.0-34.9)- she agrees to work on her lifestyle modifications to help her lose weight.   I am having Danielle Frazier maintain her Multiple Vitamins-Minerals (CENTRUM SILVER PO), BAYER CONTOUR MONITOR, glucose blood, linaclotide, darifenacin, clonazePAM, ranitidine, BYSTOLIC, allopurinol, and lisinopril-hydrochlorothiazide.  No orders of the defined types were placed in  this encounter.    Follow-up: Return in about 6 months (around 07/05/2016).  Danielle Calico, MD

## 2016-01-05 NOTE — Progress Notes (Signed)
Pre visit review using our clinic review tool, if applicable. No additional management support is needed unless otherwise documented below in the visit note. 

## 2016-01-05 NOTE — Patient Instructions (Signed)
Hypertension Hypertension, commonly called high blood pressure, is when the force of blood pumping through your arteries is too strong. Your arteries are the blood vessels that carry blood from your heart throughout your body. A blood pressure reading consists of a higher number over a lower number, such as 110/72. The higher number (systolic) is the pressure inside your arteries when your heart pumps. The lower number (diastolic) is the pressure inside your arteries when your heart relaxes. Ideally you want your blood pressure below 120/80. Hypertension forces your heart to work harder to pump blood. Your arteries may become narrow or stiff. Having untreated or uncontrolled hypertension can cause heart attack, stroke, kidney disease, and other problems. RISK FACTORS Some risk factors for high blood pressure are controllable. Others are not.  Risk factors you cannot control include:   Race. You may be at higher risk if you are African American.  Age. Risk increases with age.  Gender. Men are at higher risk than women before age 45 years. After age 65, women are at higher risk than men. Risk factors you can control include:  Not getting enough exercise or physical activity.  Being overweight.  Getting too much fat, sugar, calories, or salt in your diet.  Drinking too much alcohol. SIGNS AND SYMPTOMS Hypertension does not usually cause signs or symptoms. Extremely high blood pressure (hypertensive crisis) may cause headache, anxiety, shortness of breath, and nosebleed. DIAGNOSIS To check if you have hypertension, your health care provider will measure your blood pressure while you are seated, with your arm held at the level of your heart. It should be measured at least twice using the same arm. Certain conditions can cause a difference in blood pressure between your right and left arms. A blood pressure reading that is higher than normal on one occasion does not mean that you need treatment. If  it is not clear whether you have high blood pressure, you may be asked to return on a different day to have your blood pressure checked again. Or, you may be asked to monitor your blood pressure at home for 1 or more weeks. TREATMENT Treating high blood pressure includes making lifestyle changes and possibly taking medicine. Living a healthy lifestyle can help lower high blood pressure. You may need to change some of your habits. Lifestyle changes may include:  Following the DASH diet. This diet is high in fruits, vegetables, and whole grains. It is low in salt, red meat, and added sugars.  Keep your sodium intake below 2,300 mg per day.  Getting at least 30-45 minutes of aerobic exercise at least 4 times per week.  Losing weight if necessary.  Not smoking.  Limiting alcoholic beverages.  Learning ways to reduce stress. Your health care provider may prescribe medicine if lifestyle changes are not enough to get your blood pressure under control, and if one of the following is true:  You are 18-59 years of age and your systolic blood pressure is above 140.  You are 60 years of age or older, and your systolic blood pressure is above 150.  Your diastolic blood pressure is above 90.  You have diabetes, and your systolic blood pressure is over 140 or your diastolic blood pressure is over 90.  You have kidney disease and your blood pressure is above 140/90.  You have heart disease and your blood pressure is above 140/90. Your personal target blood pressure may vary depending on your medical conditions, your age, and other factors. HOME CARE INSTRUCTIONS    Have your blood pressure rechecked as directed by your health care provider.   Take medicines only as directed by your health care provider. Follow the directions carefully. Blood pressure medicines must be taken as prescribed. The medicine does not work as well when you skip doses. Skipping doses also puts you at risk for  problems.  Do not smoke.   Monitor your blood pressure at home as directed by your health care provider. SEEK MEDICAL CARE IF:   You think you are having a reaction to medicines taken.  You have recurrent headaches or feel dizzy.  You have swelling in your ankles.  You have trouble with your vision. SEEK IMMEDIATE MEDICAL CARE IF:  You develop a severe headache or confusion.  You have unusual weakness, numbness, or feel faint.  You have severe chest or abdominal pain.  You vomit repeatedly.  You have trouble breathing. MAKE SURE YOU:   Understand these instructions.  Will watch your condition.  Will get help right away if you are not doing well or get worse.   This information is not intended to replace advice given to you by your health care provider. Make sure you discuss any questions you have with your health care provider.   Document Released: 02/28/2005 Document Revised: 07/15/2014 Document Reviewed: 12/21/2012 [employer] Interactive Patient Education 2016 [employer]  

## 2016-01-27 DIAGNOSIS — Z1231 Encounter for screening mammogram for malignant neoplasm of breast: Secondary | ICD-10-CM | POA: Diagnosis not present

## 2016-01-27 LAB — HM MAMMOGRAPHY

## 2016-01-28 ENCOUNTER — Encounter: Payer: Self-pay | Admitting: Internal Medicine

## 2016-05-03 ENCOUNTER — Other Ambulatory Visit: Payer: Self-pay | Admitting: Internal Medicine

## 2016-06-03 ENCOUNTER — Other Ambulatory Visit: Payer: Self-pay | Admitting: Internal Medicine

## 2016-06-12 ENCOUNTER — Other Ambulatory Visit: Payer: Self-pay | Admitting: Internal Medicine

## 2016-06-21 DIAGNOSIS — C44622 Squamous cell carcinoma of skin of right upper limb, including shoulder: Secondary | ICD-10-CM | POA: Diagnosis not present

## 2016-06-21 DIAGNOSIS — C44212 Basal cell carcinoma of skin of right ear and external auricular canal: Secondary | ICD-10-CM | POA: Diagnosis not present

## 2016-06-21 DIAGNOSIS — L570 Actinic keratosis: Secondary | ICD-10-CM | POA: Diagnosis not present

## 2016-06-23 DIAGNOSIS — C44212 Basal cell carcinoma of skin of right ear and external auricular canal: Secondary | ICD-10-CM | POA: Diagnosis not present

## 2016-06-23 DIAGNOSIS — C44622 Squamous cell carcinoma of skin of right upper limb, including shoulder: Secondary | ICD-10-CM | POA: Diagnosis not present

## 2016-07-04 ENCOUNTER — Other Ambulatory Visit: Payer: Self-pay | Admitting: Internal Medicine

## 2016-07-05 ENCOUNTER — Other Ambulatory Visit (INDEPENDENT_AMBULATORY_CARE_PROVIDER_SITE_OTHER): Payer: Medicare Other

## 2016-07-05 ENCOUNTER — Encounter: Payer: Self-pay | Admitting: Internal Medicine

## 2016-07-05 ENCOUNTER — Ambulatory Visit (INDEPENDENT_AMBULATORY_CARE_PROVIDER_SITE_OTHER): Payer: Medicare Other | Admitting: Internal Medicine

## 2016-07-05 VITALS — BP 134/84 | HR 62 | Temp 98.0°F | Ht 66.0 in | Wt 195.2 lb

## 2016-07-05 DIAGNOSIS — I10 Essential (primary) hypertension: Secondary | ICD-10-CM

## 2016-07-05 DIAGNOSIS — E785 Hyperlipidemia, unspecified: Secondary | ICD-10-CM

## 2016-07-05 DIAGNOSIS — N183 Chronic kidney disease, stage 3 unspecified: Secondary | ICD-10-CM

## 2016-07-05 DIAGNOSIS — E1121 Type 2 diabetes mellitus with diabetic nephropathy: Secondary | ICD-10-CM | POA: Diagnosis not present

## 2016-07-05 LAB — CBC WITH DIFFERENTIAL/PLATELET
BASOS ABS: 0.1 10*3/uL (ref 0.0–0.1)
Basophils Relative: 0.8 % (ref 0.0–3.0)
Eosinophils Absolute: 0.2 10*3/uL (ref 0.0–0.7)
Eosinophils Relative: 3.2 % (ref 0.0–5.0)
HEMATOCRIT: 40.2 % (ref 36.0–46.0)
Hemoglobin: 13.1 g/dL (ref 12.0–15.0)
LYMPHS PCT: 25.7 % (ref 12.0–46.0)
Lymphs Abs: 2 10*3/uL (ref 0.7–4.0)
MCHC: 32.7 g/dL (ref 30.0–36.0)
MCV: 99.4 fl (ref 78.0–100.0)
MONOS PCT: 6.2 % (ref 3.0–12.0)
Monocytes Absolute: 0.5 10*3/uL (ref 0.1–1.0)
NEUTROS PCT: 64.1 % (ref 43.0–77.0)
Neutro Abs: 5 10*3/uL (ref 1.4–7.7)
Platelets: 220 10*3/uL (ref 150.0–400.0)
RBC: 4.04 Mil/uL (ref 3.87–5.11)
RDW: 14.7 % (ref 11.5–15.5)
WBC: 7.8 10*3/uL (ref 4.0–10.5)

## 2016-07-05 LAB — MICROALBUMIN / CREATININE URINE RATIO
CREATININE, U: 98.6 mg/dL
Microalb Creat Ratio: 147.9 mg/g — ABNORMAL HIGH (ref 0.0–30.0)
Microalb, Ur: 145.9 mg/dL — ABNORMAL HIGH (ref 0.0–1.9)

## 2016-07-05 LAB — URINALYSIS, ROUTINE W REFLEX MICROSCOPIC
BILIRUBIN URINE: NEGATIVE
KETONES UR: NEGATIVE
Nitrite: NEGATIVE
PH: 6 (ref 5.0–8.0)
Specific Gravity, Urine: 1.02 (ref 1.000–1.030)
Total Protein, Urine: 100 — AB
UROBILINOGEN UA: 0.2 (ref 0.0–1.0)
Urine Glucose: NEGATIVE

## 2016-07-05 LAB — BASIC METABOLIC PANEL
BUN: 37 mg/dL — ABNORMAL HIGH (ref 6–23)
CALCIUM: 11 mg/dL — AB (ref 8.4–10.5)
CO2: 31 mEq/L (ref 19–32)
Chloride: 104 mEq/L (ref 96–112)
Creatinine, Ser: 2.75 mg/dL — ABNORMAL HIGH (ref 0.40–1.20)
GFR: 17.5 mL/min — AB (ref >60.00)
Glucose, Bld: 121 mg/dL — ABNORMAL HIGH (ref 70–99)
Potassium: 4.1 mEq/L (ref 3.5–5.1)
SODIUM: 142 meq/L (ref 135–145)

## 2016-07-05 LAB — LIPID PANEL
CHOL/HDL RATIO: 3
Cholesterol: 126 mg/dL (ref 0–200)
HDL: 40.7 mg/dL (ref >39.00)
LDL CALC: 56 mg/dL (ref 0–99)
NONHDL: 84.81
Triglycerides: 144 mg/dL (ref 0.0–149.0)
VLDL: 28.8 mg/dL (ref 0.0–40.0)

## 2016-07-05 LAB — HEMOGLOBIN A1C: Hgb A1c MFr Bld: 6.1 % (ref 4.6–6.5)

## 2016-07-05 NOTE — Progress Notes (Signed)
Subjective:  Patient ID: Danielle Frazier, female    DOB: 07/22/1930  Age: 83 y.o. MRN: [id number]  CC: Hyperlipidemia; Hypertension; and Diabetes   HPI Danielle Frazier presents for f/up - she complains of persistent anxiety but offers no other complaints.  Outpatient Medications Prior to Visit  Medication Sig Dispense Refill  . allopurinol (ZYLOPRIM) 300 MG tablet TAKE 1/2 TABLET BY MOUTH DAILY 45 tablet 1  . Blood Glucose Monitoring Suppl (BAYER CONTOUR MONITOR) W/DEVICE KIT 1 Act by Does not apply route 2 (two) times daily. 100 kit 0  . BYSTOLIC 5 MG tablet TAKE 1 TABLET BY MOUTH DAILY 90 tablet 1  . glucose blood (BAYER CONTOUR TEST) test strip Use BID 100 each 11  . Linaclotide (LINZESS) 145 MCG CAPS capsule Take 1 capsule (145 mcg total) by mouth daily. 35 capsule 0  . lisinopril-hydrochlorothiazide (PRINZIDE,ZESTORETIC) 20-25 MG tablet TAKE 1 TABLET BY MOUTH DAILY. 90 tablet 1  . Multiple Vitamins-Minerals (CENTRUM SILVER PO) Take 1 tablet by mouth daily.      . ranitidine (ZANTAC) 300 MG tablet Take 1 tablet (300 mg total) by mouth at bedtime. 90 tablet 3  . clonazePAM (KLONOPIN) 0.5 MG tablet Take 1 tablet (0.5 mg total) by mouth 2 (two) times daily. 60 tablet 5  . darifenacin (ENABLEX) 15 MG 24 hr tablet Take 1 tablet (15 mg total) by mouth daily. 90 tablet 3   No facility-administered medications prior to visit.     ROS Review of Systems  Constitutional: Negative.  Negative for appetite change, diaphoresis, fatigue and unexpected weight change.  HENT: Negative.   Eyes: Negative for visual disturbance.  Respiratory: Negative for cough, chest tightness, shortness of breath and wheezing.   Cardiovascular: Negative for chest pain, palpitations and leg swelling.  Gastrointestinal: Negative for abdominal pain, constipation, diarrhea, nausea and vomiting.  Endocrine: Negative.  Negative for cold intolerance, heat intolerance, polydipsia, polyphagia and polyuria.  Genitourinary:  Negative.  Negative for difficulty urinating.  Musculoskeletal: Negative.  Negative for back pain and neck pain.  Skin: Negative.   Allergic/Immunologic: Negative.   Neurological: Negative for dizziness, weakness and headaches.  Hematological: Negative for adenopathy. Does not bruise/bleed easily.  Psychiatric/Behavioral: Negative for confusion, decreased concentration, dysphoric mood, self-injury, sleep disturbance and suicidal ideas. The patient is nervous/anxious.     Objective:  BP 134/84 (BP Location: Left Arm, Patient Position: Sitting, Cuff Size: Normal)   Pulse 62   Temp 98 F (36.7 C) (Oral)   Ht '5\' 6"'  (1.676 m)   Wt 195 lb 4 oz (88.6 kg)   SpO2 96%   BMI 31.51 kg/m   BP Readings from Last 3 Encounters:  07/05/16 134/84  01/05/16 (!) 150/90  07/06/15 138/84    Wt Readings from Last 3 Encounters:  07/05/16 195 lb 4 oz (88.6 kg)  01/05/16 199 lb 12 oz (90.6 kg)  07/06/15 203 lb (92.1 kg)    Physical Exam  Constitutional: She is oriented to person, place, and time.  Non-toxic appearance. She does not have a sickly appearance. She does not appear ill. No distress.  HENT:  Mouth/Throat: Oropharynx is clear and moist. No oropharyngeal exudate.  Eyes: Conjunctivae are normal. Right eye exhibits no discharge. Left eye exhibits no discharge. No scleral icterus.  Neck: Normal range of motion. Neck supple. No JVD present. No tracheal deviation present. No thyromegaly present.  Cardiovascular: Normal rate, regular rhythm, normal heart sounds and intact distal pulses.  Exam reveals no gallop and no friction  rub.   No murmur heard. Pulmonary/Chest: Effort normal and breath sounds normal. No stridor. No respiratory distress. She has no wheezes. She has no rales. She exhibits no tenderness.  Abdominal: Soft. Bowel sounds are normal. She exhibits no distension and no mass. There is no tenderness. There is no rebound and no guarding.  Musculoskeletal: Normal range of motion. She  exhibits no edema, tenderness or deformity.  Lymphadenopathy:    She has no cervical adenopathy.  Neurological: She is oriented to person, place, and time.  Skin: Skin is warm and dry. No rash noted. She is not diaphoretic. No erythema. No pallor.  Psychiatric: She has a normal mood and affect. Her behavior is normal. Judgment and thought content normal.  Vitals reviewed.   Lab Results  Component Value Date   WBC 7.8 07/05/2016   HGB 13.1 07/05/2016   HCT 40.2 07/05/2016   PLT 220.0 07/05/2016   GLUCOSE 121 (H) 07/05/2016   CHOL 126 07/05/2016   TRIG 144.0 07/05/2016   HDL 40.70 07/05/2016   LDLDIRECT 90.0 07/06/2015   LDLCALC 56 07/05/2016   ALT 28 07/06/2015   AST 27 07/06/2015   NA 142 07/05/2016   K 4.1 07/05/2016   CL 104 07/05/2016   CREATININE 2.75 (H) 07/05/2016   BUN 37 (H) 07/05/2016   CO2 31 07/05/2016   TSH 3.13 07/06/2015   HGBA1C 6.1 07/05/2016   MICROALBUR 145.9 (H) 07/05/2016    No results found.  Assessment & Plan:   Danielle Frazier was seen today for hyperlipidemia, hypertension and diabetes.  Diagnoses and all orders for this visit:  Essential hypertension- Her blood pressure is adequately well controlled, electrolytes are normal, renal function is stable. -     Basic metabolic panel; Future -     CBC with Differential/Platelet; Future -     Urinalysis, Routine w reflex microscopic; Future  Chronic renal insufficiency, stage III (moderate)- her renal function is stable, she agrees to avoid nephrotoxic agents, will continue to maintain good blood pressure and blood sugar control. -     CBC with Differential/Platelet; Future -     Urinalysis, Routine w reflex microscopic; Future  Type 2 diabetes mellitus with diabetic nephropathy, without long-term current use of insulin ([place])- with an A1c of 6.1% her blood sugars are adequately well controlled. -     Hemoglobin A1c; Future -     Microalbumin / creatinine urine ratio; Future  Hyperlipidemia with target  LDL less than 100- she has achieved her LDL goal and she is not interested in taking a statin for cardiovascular risk reduction. -     Lipid panel; Future   I have discontinued [name]'s darifenacin. I am also having her maintain her Multiple Vitamins-Minerals (CENTRUM SILVER PO), BAYER CONTOUR MONITOR, glucose blood, linaclotide, ranitidine, BYSTOLIC, lisinopril-hydrochlorothiazide, and allopurinol.  No orders of the defined types were placed in this encounter.    Follow-up: Return in about 6 months (around 01/04/2017).  [name], MD

## 2016-07-05 NOTE — Patient Instructions (Signed)

## 2016-07-05 NOTE — Progress Notes (Signed)
Pre visit review using our clinic review tool, if applicable. No additional management support is needed unless otherwise documented below in the visit note. 

## 2016-07-06 ENCOUNTER — Other Ambulatory Visit: Payer: Self-pay | Admitting: Internal Medicine

## 2016-07-19 DIAGNOSIS — L821 Other seborrheic keratosis: Secondary | ICD-10-CM | POA: Diagnosis not present

## 2016-07-19 DIAGNOSIS — D225 Melanocytic nevi of trunk: Secondary | ICD-10-CM | POA: Diagnosis not present

## 2016-07-19 DIAGNOSIS — Z85828 Personal history of other malignant neoplasm of skin: Secondary | ICD-10-CM | POA: Diagnosis not present

## 2016-07-19 DIAGNOSIS — L820 Inflamed seborrheic keratosis: Secondary | ICD-10-CM | POA: Diagnosis not present

## 2016-07-19 DIAGNOSIS — L570 Actinic keratosis: Secondary | ICD-10-CM | POA: Diagnosis not present

## 2016-08-02 DIAGNOSIS — C44622 Squamous cell carcinoma of skin of right upper limb, including shoulder: Secondary | ICD-10-CM | POA: Diagnosis not present

## 2016-08-25 DIAGNOSIS — C44212 Basal cell carcinoma of skin of right ear and external auricular canal: Secondary | ICD-10-CM | POA: Diagnosis not present

## 2016-11-14 ENCOUNTER — Other Ambulatory Visit: Payer: Self-pay | Admitting: Internal Medicine

## 2016-11-16 DIAGNOSIS — Z9849 Cataract extraction status, unspecified eye: Secondary | ICD-10-CM | POA: Diagnosis not present

## 2016-11-16 DIAGNOSIS — Z961 Presence of intraocular lens: Secondary | ICD-10-CM | POA: Diagnosis not present

## 2016-11-16 DIAGNOSIS — H5203 Hypermetropia, bilateral: Secondary | ICD-10-CM | POA: Diagnosis not present

## 2016-11-16 DIAGNOSIS — H43813 Vitreous degeneration, bilateral: Secondary | ICD-10-CM | POA: Diagnosis not present

## 2017-01-04 ENCOUNTER — Other Ambulatory Visit (INDEPENDENT_AMBULATORY_CARE_PROVIDER_SITE_OTHER): Payer: Medicare Other

## 2017-01-04 ENCOUNTER — Ambulatory Visit (INDEPENDENT_AMBULATORY_CARE_PROVIDER_SITE_OTHER): Payer: Medicare Other | Admitting: Internal Medicine

## 2017-01-04 ENCOUNTER — Encounter: Payer: Self-pay | Admitting: Internal Medicine

## 2017-01-04 VITALS — BP 140/80 | HR 66 | Temp 97.7°F | Resp 16 | Ht 66.0 in | Wt 195.0 lb

## 2017-01-04 DIAGNOSIS — Z23 Encounter for immunization: Secondary | ICD-10-CM

## 2017-01-04 DIAGNOSIS — M25551 Pain in right hip: Secondary | ICD-10-CM | POA: Diagnosis not present

## 2017-01-04 DIAGNOSIS — E1121 Type 2 diabetes mellitus with diabetic nephropathy: Secondary | ICD-10-CM | POA: Diagnosis not present

## 2017-01-04 DIAGNOSIS — G8929 Other chronic pain: Secondary | ICD-10-CM

## 2017-01-04 DIAGNOSIS — I10 Essential (primary) hypertension: Secondary | ICD-10-CM

## 2017-01-04 LAB — BASIC METABOLIC PANEL
BUN: 42 mg/dL — ABNORMAL HIGH (ref 6–23)
CHLORIDE: 103 meq/L (ref 96–112)
CO2: 30 meq/L (ref 19–32)
Calcium: 10.1 mg/dL (ref 8.4–10.5)
Creatinine, Ser: 3.09 mg/dL — ABNORMAL HIGH (ref 0.40–1.20)
GFR: 15.28 mL/min — ABNORMAL LOW (ref >60.00)
Glucose, Bld: 136 mg/dL — ABNORMAL HIGH (ref 70–99)
POTASSIUM: 3.7 meq/L (ref 3.5–5.1)
SODIUM: 144 meq/L (ref 135–145)

## 2017-01-04 LAB — HEMOGLOBIN A1C: HEMOGLOBIN A1C: 6.2 % (ref 4.6–6.5)

## 2017-01-04 NOTE — Progress Notes (Signed)
Subjective:  Patient ID: Danielle Frazier, female    DOB: Apr 26, 1930  Age: 83 y.o. MRN: [id number]  CC: Hypertension; Diabetes; and Osteoarthritis   HPI Danielle Frazier presents for f/up - she complains of a several year history of discomfort in the front of her right hip and groin.  The pain occurs with activity and she takes an over-the-counter anti-inflammatory which provides symptom relief.  She previously had plain films done that were unremarkable.  She does not want to repeat any x-rays today.  She offers no other new complaints today.  Outpatient Medications Prior to Visit  Medication Sig Dispense Refill  . allopurinol (ZYLOPRIM) 300 MG tablet TAKE 1/2 TABLET BY MOUTH DAILY 45 tablet 1  . Blood Glucose Monitoring Suppl (BAYER CONTOUR MONITOR) W/DEVICE KIT 1 Act by Does not apply route 2 (two) times daily. 100 kit 0  . BYSTOLIC 5 MG tablet TAKE 1 TABLET BY MOUTH DAILY 90 tablet 1  . clonazePAM (KLONOPIN) 0.5 MG tablet TAKE 1 TABLET BY MOUTH TWICE A DAY 60 tablet 5  . glucose blood (BAYER CONTOUR TEST) test strip Use BID 100 each 11  . Linaclotide (LINZESS) 145 MCG CAPS capsule Take 1 capsule (145 mcg total) by mouth daily. 35 capsule 0  . lisinopril-hydrochlorothiazide (PRINZIDE,ZESTORETIC) 20-25 MG tablet TAKE 1 TABLET BY MOUTH DAILY. 90 tablet 1  . Multiple Vitamins-Minerals (CENTRUM SILVER PO) Take 1 tablet by mouth daily.      . ranitidine (ZANTAC) 300 MG tablet Take 1 tablet (300 mg total) by mouth at bedtime. 90 tablet 3   No facility-administered medications prior to visit.     ROS Review of Systems  Constitutional: Negative.  Negative for appetite change, chills, diaphoresis, fatigue and fever.  HENT: Negative.   Eyes: Negative.   Respiratory: Negative.  Negative for cough, chest tightness, shortness of breath and wheezing.   Cardiovascular: Negative for chest pain, palpitations and leg swelling.  Gastrointestinal: Negative for abdominal pain, blood in stool, constipation,  diarrhea, nausea and vomiting.  Endocrine: Negative.  Negative for polydipsia, polyphagia and polyuria.  Genitourinary: Negative.   Musculoskeletal: Positive for arthralgias. Negative for back pain, myalgias and neck pain.  Skin: Negative.  Negative for color change and rash.  Allergic/Immunologic: Negative.   Neurological: Negative.  Negative for dizziness, weakness, light-headedness and headaches.  Hematological: Negative for adenopathy. Does not bruise/bleed easily.  Psychiatric/Behavioral: Negative for confusion, decreased concentration, dysphoric mood, sleep disturbance and suicidal ideas. The patient is nervous/anxious.     Objective:  BP 140/80 (BP Location: Right Arm, Patient Position: Sitting, Cuff Size: Large)   Pulse 66   Temp 97.7 F (36.5 C) (Oral)   Resp 16   Ht _0  (1.676 m)   Wt 195 lb (88.5 kg)   SpO2 96%   BMI 31.47 kg/m   BP Readings from Last 3 Encounters:  01/04/17 140/80  07/05/16 134/84  01/05/16 (!) 150/90    Wt Readings from Last 3 Encounters:  01/04/17 195 lb (88.5 kg)  07/05/16 195 lb 4 oz (88.6 kg)  01/05/16 199 lb 12 oz (90.6 kg)    Physical Exam  Constitutional: She is oriented to person, place, and time. No distress.  HENT:  Mouth/Throat: Oropharynx is clear and moist. No oropharyngeal exudate.  Eyes: Conjunctivae are normal. Right eye exhibits no discharge. Left eye exhibits no discharge. No scleral icterus.  Neck: Normal range of motion. Neck supple. No JVD present. No thyromegaly present.  Cardiovascular: Normal rate, regular rhythm and  intact distal pulses.  Exam reveals no gallop and no friction rub.   No murmur heard. Pulmonary/Chest: Effort normal and breath sounds normal. No respiratory distress. She has no wheezes. She has no rales. She exhibits no tenderness.  Abdominal: Soft. Bowel sounds are normal. She exhibits no distension and no mass. There is no tenderness. There is no rebound and no guarding.  Musculoskeletal: Normal  range of motion. She exhibits no edema, tenderness or deformity.       Right hip: Normal. She exhibits normal range of motion, normal strength, no tenderness, no bony tenderness, no swelling and no deformity.  Lymphadenopathy:    She has no cervical adenopathy.  Neurological: She is alert and oriented to person, place, and time.  Skin: Skin is warm and dry. No rash noted. She is not diaphoretic. No erythema. No pallor.  Vitals reviewed.   Lab Results  Component Value Date   WBC 7.8 07/05/2016   HGB 13.1 07/05/2016   HCT 40.2 07/05/2016   PLT 220.0 07/05/2016   GLUCOSE 136 (H) 01/04/2017   CHOL 126 07/05/2016   TRIG 144.0 07/05/2016   HDL 40.70 07/05/2016   LDLDIRECT 90.0 07/06/2015   LDLCALC 56 07/05/2016   ALT 28 07/06/2015   AST 27 07/06/2015   NA 144 01/04/2017   K 3.7 01/04/2017   CL 103 01/04/2017   CREATININE 3.09 (H) 01/04/2017   BUN 42 (H) 01/04/2017   CO2 30 01/04/2017   TSH 3.13 07/06/2015   HGBA1C 6.2 01/04/2017   MICROALBUR 145.9 (H) 07/05/2016    No results found.  Assessment & Plan:   Danielle Frazier was seen today for hypertension, diabetes and osteoarthritis.  Diagnoses and all orders for this visit:  Need for influenza vaccination -     Flu vaccine HIGH DOSE PF (Fluzone High dose)  Hip pain, chronic, right- her exam and the description of her pain sounds musculoskeletal.  Since she has renal insufficiency have asked her to stop taking an NSAID and instead control the pain with Tylenol as needed.  Essential hypertension-blood pressures well controlled.  Electrolytes are normal.  She has had a slight decrease in her renal function.  She agrees to avoid anti-inflammatories. -     Basic metabolic panel; Future  Type 2 diabetes mellitus with diabetic nephropathy, without long-term current use of insulin (Danielle Frazier)- her A1c is down to 6.2%.  Her blood sugars are well controlled with no medication. -     Basic metabolic panel; Future -     Hemoglobin A1c; Future   I  am having Danielle Frazier maintain her Multiple Vitamins-Minerals (CENTRUM SILVER PO), BAYER CONTOUR MONITOR, glucose blood, linaclotide, ranitidine, lisinopril-hydrochlorothiazide, allopurinol, clonazePAM, and BYSTOLIC.  No orders of the defined types were placed in this encounter.    Follow-up: Return in about 6 months (around 07/05/2017).  [name], MD

## 2017-01-04 NOTE — Patient Instructions (Signed)

## 2017-01-05 ENCOUNTER — Encounter: Payer: Self-pay | Admitting: Internal Medicine

## 2017-01-06 ENCOUNTER — Telehealth: Payer: Self-pay | Admitting: *Deleted

## 2017-01-06 NOTE — Telephone Encounter (Signed)
Called patient to schedule an AWV. Patient stated she would call nurse back with an appointment date. Nurse gave patient her contact information.

## 2017-03-12 ENCOUNTER — Other Ambulatory Visit: Payer: Self-pay | Admitting: Internal Medicine

## 2017-03-28 DIAGNOSIS — M5441 Lumbago with sciatica, right side: Secondary | ICD-10-CM | POA: Diagnosis not present

## 2017-03-28 DIAGNOSIS — M9902 Segmental and somatic dysfunction of thoracic region: Secondary | ICD-10-CM | POA: Diagnosis not present

## 2017-03-28 DIAGNOSIS — M9905 Segmental and somatic dysfunction of pelvic region: Secondary | ICD-10-CM | POA: Diagnosis not present

## 2017-03-28 DIAGNOSIS — M9903 Segmental and somatic dysfunction of lumbar region: Secondary | ICD-10-CM | POA: Diagnosis not present

## 2017-03-31 DIAGNOSIS — M9902 Segmental and somatic dysfunction of thoracic region: Secondary | ICD-10-CM | POA: Diagnosis not present

## 2017-03-31 DIAGNOSIS — M9905 Segmental and somatic dysfunction of pelvic region: Secondary | ICD-10-CM | POA: Diagnosis not present

## 2017-03-31 DIAGNOSIS — M5441 Lumbago with sciatica, right side: Secondary | ICD-10-CM | POA: Diagnosis not present

## 2017-03-31 DIAGNOSIS — M9903 Segmental and somatic dysfunction of lumbar region: Secondary | ICD-10-CM | POA: Diagnosis not present

## 2017-04-03 DIAGNOSIS — M9903 Segmental and somatic dysfunction of lumbar region: Secondary | ICD-10-CM | POA: Diagnosis not present

## 2017-04-03 DIAGNOSIS — M9905 Segmental and somatic dysfunction of pelvic region: Secondary | ICD-10-CM | POA: Diagnosis not present

## 2017-04-03 DIAGNOSIS — M9902 Segmental and somatic dysfunction of thoracic region: Secondary | ICD-10-CM | POA: Diagnosis not present

## 2017-04-03 DIAGNOSIS — M5441 Lumbago with sciatica, right side: Secondary | ICD-10-CM | POA: Diagnosis not present

## 2017-04-05 DIAGNOSIS — M5441 Lumbago with sciatica, right side: Secondary | ICD-10-CM | POA: Diagnosis not present

## 2017-04-05 DIAGNOSIS — M9905 Segmental and somatic dysfunction of pelvic region: Secondary | ICD-10-CM | POA: Diagnosis not present

## 2017-04-05 DIAGNOSIS — M9902 Segmental and somatic dysfunction of thoracic region: Secondary | ICD-10-CM | POA: Diagnosis not present

## 2017-04-05 DIAGNOSIS — M9903 Segmental and somatic dysfunction of lumbar region: Secondary | ICD-10-CM | POA: Diagnosis not present

## 2017-04-10 DIAGNOSIS — M9903 Segmental and somatic dysfunction of lumbar region: Secondary | ICD-10-CM | POA: Diagnosis not present

## 2017-04-10 DIAGNOSIS — M9902 Segmental and somatic dysfunction of thoracic region: Secondary | ICD-10-CM | POA: Diagnosis not present

## 2017-04-10 DIAGNOSIS — M9905 Segmental and somatic dysfunction of pelvic region: Secondary | ICD-10-CM | POA: Diagnosis not present

## 2017-04-10 DIAGNOSIS — M5441 Lumbago with sciatica, right side: Secondary | ICD-10-CM | POA: Diagnosis not present

## 2017-04-12 DIAGNOSIS — M9903 Segmental and somatic dysfunction of lumbar region: Secondary | ICD-10-CM | POA: Diagnosis not present

## 2017-04-12 DIAGNOSIS — M9905 Segmental and somatic dysfunction of pelvic region: Secondary | ICD-10-CM | POA: Diagnosis not present

## 2017-04-12 DIAGNOSIS — M9902 Segmental and somatic dysfunction of thoracic region: Secondary | ICD-10-CM | POA: Diagnosis not present

## 2017-04-12 DIAGNOSIS — M5441 Lumbago with sciatica, right side: Secondary | ICD-10-CM | POA: Diagnosis not present

## 2017-04-17 DIAGNOSIS — M9903 Segmental and somatic dysfunction of lumbar region: Secondary | ICD-10-CM | POA: Diagnosis not present

## 2017-04-17 DIAGNOSIS — M9902 Segmental and somatic dysfunction of thoracic region: Secondary | ICD-10-CM | POA: Diagnosis not present

## 2017-04-17 DIAGNOSIS — M9905 Segmental and somatic dysfunction of pelvic region: Secondary | ICD-10-CM | POA: Diagnosis not present

## 2017-04-17 DIAGNOSIS — M5441 Lumbago with sciatica, right side: Secondary | ICD-10-CM | POA: Diagnosis not present

## 2017-04-18 DIAGNOSIS — D485 Neoplasm of uncertain behavior of skin: Secondary | ICD-10-CM | POA: Diagnosis not present

## 2017-04-18 DIAGNOSIS — C44729 Squamous cell carcinoma of skin of left lower limb, including hip: Secondary | ICD-10-CM | POA: Diagnosis not present

## 2017-04-18 DIAGNOSIS — L578 Other skin changes due to chronic exposure to nonionizing radiation: Secondary | ICD-10-CM | POA: Diagnosis not present

## 2017-04-18 DIAGNOSIS — Z85828 Personal history of other malignant neoplasm of skin: Secondary | ICD-10-CM | POA: Diagnosis not present

## 2017-04-18 DIAGNOSIS — L570 Actinic keratosis: Secondary | ICD-10-CM | POA: Diagnosis not present

## 2017-04-21 DIAGNOSIS — M9905 Segmental and somatic dysfunction of pelvic region: Secondary | ICD-10-CM | POA: Diagnosis not present

## 2017-04-21 DIAGNOSIS — M9902 Segmental and somatic dysfunction of thoracic region: Secondary | ICD-10-CM | POA: Diagnosis not present

## 2017-04-21 DIAGNOSIS — M9903 Segmental and somatic dysfunction of lumbar region: Secondary | ICD-10-CM | POA: Diagnosis not present

## 2017-04-21 DIAGNOSIS — M5441 Lumbago with sciatica, right side: Secondary | ICD-10-CM | POA: Diagnosis not present

## 2017-04-25 DIAGNOSIS — C44729 Squamous cell carcinoma of skin of left lower limb, including hip: Secondary | ICD-10-CM | POA: Diagnosis not present

## 2017-05-05 DIAGNOSIS — M5441 Lumbago with sciatica, right side: Secondary | ICD-10-CM | POA: Diagnosis not present

## 2017-05-05 DIAGNOSIS — M9905 Segmental and somatic dysfunction of pelvic region: Secondary | ICD-10-CM | POA: Diagnosis not present

## 2017-05-05 DIAGNOSIS — M9902 Segmental and somatic dysfunction of thoracic region: Secondary | ICD-10-CM | POA: Diagnosis not present

## 2017-05-05 DIAGNOSIS — M9903 Segmental and somatic dysfunction of lumbar region: Secondary | ICD-10-CM | POA: Diagnosis not present

## 2017-05-11 ENCOUNTER — Other Ambulatory Visit: Payer: Self-pay | Admitting: Internal Medicine

## 2017-06-02 DIAGNOSIS — M9905 Segmental and somatic dysfunction of pelvic region: Secondary | ICD-10-CM | POA: Diagnosis not present

## 2017-06-02 DIAGNOSIS — M9903 Segmental and somatic dysfunction of lumbar region: Secondary | ICD-10-CM | POA: Diagnosis not present

## 2017-06-02 DIAGNOSIS — M9902 Segmental and somatic dysfunction of thoracic region: Secondary | ICD-10-CM | POA: Diagnosis not present

## 2017-06-02 DIAGNOSIS — M5441 Lumbago with sciatica, right side: Secondary | ICD-10-CM | POA: Diagnosis not present

## 2017-06-16 DIAGNOSIS — L905 Scar conditions and fibrosis of skin: Secondary | ICD-10-CM | POA: Diagnosis not present

## 2017-06-16 DIAGNOSIS — L570 Actinic keratosis: Secondary | ICD-10-CM | POA: Diagnosis not present

## 2017-06-16 DIAGNOSIS — Z85828 Personal history of other malignant neoplasm of skin: Secondary | ICD-10-CM | POA: Diagnosis not present

## 2017-06-16 DIAGNOSIS — D485 Neoplasm of uncertain behavior of skin: Secondary | ICD-10-CM | POA: Diagnosis not present

## 2017-07-05 ENCOUNTER — Other Ambulatory Visit (INDEPENDENT_AMBULATORY_CARE_PROVIDER_SITE_OTHER): Payer: Medicare Other

## 2017-07-05 ENCOUNTER — Encounter: Payer: Self-pay | Admitting: Internal Medicine

## 2017-07-05 ENCOUNTER — Ambulatory Visit (INDEPENDENT_AMBULATORY_CARE_PROVIDER_SITE_OTHER): Payer: Medicare Other | Admitting: Internal Medicine

## 2017-07-05 VITALS — BP 154/90 | HR 64 | Temp 98.4°F | Resp 16 | Ht 66.0 in | Wt 195.0 lb

## 2017-07-05 DIAGNOSIS — E785 Hyperlipidemia, unspecified: Secondary | ICD-10-CM

## 2017-07-05 DIAGNOSIS — N183 Chronic kidney disease, stage 3 unspecified: Secondary | ICD-10-CM

## 2017-07-05 DIAGNOSIS — I10 Essential (primary) hypertension: Secondary | ICD-10-CM

## 2017-07-05 DIAGNOSIS — E1121 Type 2 diabetes mellitus with diabetic nephropathy: Secondary | ICD-10-CM | POA: Diagnosis not present

## 2017-07-05 LAB — LIPID PANEL
CHOL/HDL RATIO: 3
Cholesterol: 146 mg/dL (ref 0–200)
HDL: 43.6 mg/dL (ref >39.00)
LDL CALC: 65 mg/dL (ref 0–99)
NONHDL: 102.86
TRIGLYCERIDES: 188 mg/dL — AB (ref 0.0–149.0)
VLDL: 37.6 mg/dL (ref 0.0–40.0)

## 2017-07-05 LAB — THYROID PANEL WITH TSH
FREE THYROXINE INDEX: 2.3 (ref 1.4–3.8)
T3 UPTAKE: 28 % (ref 22–35)
T4 TOTAL: 8.1 ug/dL (ref 5.1–11.9)
TSH: 2.96 mIU/L (ref 0.40–4.50)

## 2017-07-05 LAB — BASIC METABOLIC PANEL
BUN: 38 mg/dL — ABNORMAL HIGH (ref 6–23)
CO2: 29 mEq/L (ref 19–32)
Calcium: 10.4 mg/dL (ref 8.4–10.5)
Chloride: 105 mEq/L (ref 96–112)
Creatinine, Ser: 2.79 mg/dL — ABNORMAL HIGH (ref 0.40–1.20)
GFR: 17.17 mL/min — AB (ref >60.00)
Glucose, Bld: 121 mg/dL — ABNORMAL HIGH (ref 70–99)
POTASSIUM: 4 meq/L (ref 3.5–5.1)
SODIUM: 143 meq/L (ref 135–145)

## 2017-07-05 LAB — HEMOGLOBIN A1C: Hgb A1c MFr Bld: 6.1 % (ref 4.6–6.5)

## 2017-07-05 NOTE — Patient Instructions (Signed)

## 2017-07-05 NOTE — Progress Notes (Signed)
Subjective:  Patient ID: Danielle Frazier, female    DOB: [birth year]  Age: 84 y.o. MRN: [id number]  CC: Hypertension; Hyperlipidemia; and Diabetes   HPI Danielle Frazier presents for f/up -she is in her usual state of health.  She tells me her blood pressure has been well controlled.  She feels well today and offers no complaints.  Outpatient Medications Prior to Visit  Medication Sig Dispense Refill  . allopurinol (ZYLOPRIM) 300 MG tablet TAKE 1/2 TABLET BY MOUTH DAILY 45 tablet 1  . Blood Glucose Monitoring Suppl (BAYER CONTOUR MONITOR) W/DEVICE KIT 1 Act by Does not apply route 2 (two) times daily. 100 kit 0  . BYSTOLIC 5 MG tablet TAKE 1 TABLET BY MOUTH DAILY 90 tablet 1  . clonazePAM (KLONOPIN) 0.5 MG tablet TAKE 1 TABLET BY MOUTH TWICE A DAY 60 tablet 5  . glucose blood (BAYER CONTOUR TEST) test strip Use BID 100 each 11  . Linaclotide (LINZESS) 145 MCG CAPS capsule Take 1 capsule (145 mcg total) by mouth daily. 35 capsule 0  . lisinopril-hydrochlorothiazide (PRINZIDE,ZESTORETIC) 20-25 MG tablet TAKE 1 TABLET BY MOUTH DAILY. 90 tablet 1  . Multiple Vitamins-Minerals (CENTRUM SILVER PO) Take 1 tablet by mouth daily.      . penicillin v potassium (VEETID) 500 MG tablet TAKE 2 TABLETS BY MOUTH NOW THEN 1 TABLET 4 TIMES A DAY UNTIL GONE  1  . ranitidine (ZANTAC) 300 MG tablet Take 1 tablet (300 mg total) by mouth at bedtime. 90 tablet 3   No facility-administered medications prior to visit.     ROS Review of Systems  Constitutional: Negative.  Negative for diaphoresis and fatigue.  HENT: Negative.  Negative for trouble swallowing.   Eyes: Negative.   Respiratory: Negative.  Negative for cough, chest tightness, shortness of breath and wheezing.   Cardiovascular: Negative for chest pain, palpitations and leg swelling.  Gastrointestinal: Negative for abdominal pain, diarrhea, nausea and vomiting.  Endocrine: Negative.   Genitourinary: Negative.  Negative for decreased urine volume,  difficulty urinating, dysuria, hematuria and urgency.  Musculoskeletal: Negative.   Skin: Negative.   Neurological: Negative.  Negative for dizziness, weakness and light-headedness.  Hematological: Negative for adenopathy. Does not bruise/bleed easily.  Psychiatric/Behavioral: Negative.     Objective:  BP (!) 154/90 (BP Location: Left Arm, Patient Position: Sitting, Cuff Size: Large)   Pulse 64   Temp 98.4 F (36.9 C) (Oral)   Resp 16   Ht _0  (1.676 m)   Wt 195 lb (88.5 kg)   SpO2 96%   BMI 31.47 kg/m   BP Readings from Last 3 Encounters:  07/05/17 (!) 154/90  01/04/17 140/80  07/05/16 134/84    Wt Readings from Last 3 Encounters:  07/05/17 195 lb (88.5 kg)  01/04/17 195 lb (88.5 kg)  07/05/16 195 lb 4 oz (88.6 kg)    Physical Exam  Constitutional: She is oriented to person, place, and time. No distress.  HENT:  Mouth/Throat: Oropharynx is clear and moist. No oropharyngeal exudate.  Eyes: Conjunctivae are normal. No scleral icterus.  Neck: Normal range of motion. Neck supple. No JVD present. No thyromegaly present.  Cardiovascular: Normal rate, regular rhythm and normal heart sounds. Exam reveals no gallop and no friction rub.  No murmur heard. Pulmonary/Chest: Effort normal and breath sounds normal. No stridor. No respiratory distress. She has no wheezes. She has no rales.  Abdominal: Soft. Bowel sounds are normal. She exhibits no mass. There is no tenderness.  Musculoskeletal: Normal  range of motion. She exhibits no edema or deformity.  Lymphadenopathy:    She has no cervical adenopathy.  Neurological: She is alert and oriented to person, place, and time.  Skin: Skin is warm and dry. She is not diaphoretic.  Vitals reviewed.   Lab Results  Component Value Date   WBC 7.8 07/05/2016   HGB 13.1 07/05/2016   HCT 40.2 07/05/2016   PLT 220.0 07/05/2016   GLUCOSE 121 (H) 07/05/2017   CHOL 146 07/05/2017   TRIG 188.0 (H) 07/05/2017   HDL 43.60 07/05/2017    LDLDIRECT 90.0 07/06/2015   LDLCALC 65 07/05/2017   ALT 28 07/06/2015   AST 27 07/06/2015   NA 143 07/05/2017   K 4.0 07/05/2017   CL 105 07/05/2017   CREATININE 2.79 (H) 07/05/2017   BUN 38 (H) 07/05/2017   CO2 29 07/05/2017   TSH 3.13 07/06/2015   HGBA1C 6.1 07/05/2017   MICROALBUR 145.9 (H) 07/05/2016    No results found.  Assessment & Plan:   Danielle Frazier was seen today for hypertension, hyperlipidemia and diabetes.  Diagnoses and all orders for this visit:  Chronic renal insufficiency, stage III (moderate) ([place])- Her renal function is stable.  She will avoid nephrotoxic agents.  Will continue to control her blood pressure and blood sugar. -     Basic metabolic panel; Future  Type 2 diabetes mellitus with diabetic nephropathy, without long-term current use of insulin ([place])- Her blood sugar is adequately well controlled. -     Hemoglobin A1c; Future  Essential hypertension- Considering her age and comorbid illnesses will permit her blood pressure to remain at 154/90.  Electrolytes are normal.  Renal function is stable. -     Basic metabolic panel; Future -     Thyroid Panel With TSH; Future  Hyperlipidemia with target LDL less than 100- She has achieved her LDL goal, statin therapy is not indicated. -     Lipid panel; Future -     Thyroid Panel With TSH; Future   I am having Danielle Frazier maintain her Multiple Vitamins-Minerals (CENTRUM SILVER PO), BAYER CONTOUR MONITOR, glucose blood, linaclotide, ranitidine, clonazePAM, lisinopril-hydrochlorothiazide, allopurinol, BYSTOLIC, and penicillin v potassium.  No orders of the defined types were placed in this encounter.    Follow-up: Return in about 6 months (around 01/04/2018).  [name], MD

## 2017-07-06 ENCOUNTER — Telehealth: Payer: Self-pay | Admitting: Internal Medicine

## 2017-07-06 NOTE — Telephone Encounter (Signed)
Copied from [place]. Topic: Quick Communication - Office Called Patient >> Jul 06, 2017 11:19 AM Marcina Millard, CMA wrote: Reason for CRM: Called and left message for patient to return call to clinic regarding lab results. If she returns call okay to release following info to her:  Per Dr. Ronnald Ramp please let her know that her blood sugars are adequately well controlled. Her kidney function is stable. Her triglycerides are mildly elevated but this is not a significant concern. And lastly, her cholesterol levels are excellent.    Pt returning call for labs CB# [phone]

## 2017-07-06 NOTE — Telephone Encounter (Signed)
Message  Left on VM to call  Back for lab results

## 2017-07-07 NOTE — Telephone Encounter (Signed)
Pt given results per notes of Dr. Ronnald Ramp on 07/06/17, patient verbalized understanding. Unable to document in result note due to result note not being routed to [facility].

## 2017-07-14 ENCOUNTER — Encounter: Payer: Self-pay | Admitting: Internal Medicine

## 2017-07-14 DIAGNOSIS — C44629 Squamous cell carcinoma of skin of left upper limb, including shoulder: Secondary | ICD-10-CM | POA: Diagnosis not present

## 2017-07-14 DIAGNOSIS — D485 Neoplasm of uncertain behavior of skin: Secondary | ICD-10-CM | POA: Diagnosis not present

## 2017-07-14 DIAGNOSIS — L570 Actinic keratosis: Secondary | ICD-10-CM | POA: Diagnosis not present

## 2017-07-14 DIAGNOSIS — C44311 Basal cell carcinoma of skin of nose: Secondary | ICD-10-CM | POA: Diagnosis not present

## 2017-07-25 DIAGNOSIS — C44629 Squamous cell carcinoma of skin of left upper limb, including shoulder: Secondary | ICD-10-CM | POA: Diagnosis not present

## 2017-07-28 DIAGNOSIS — M9905 Segmental and somatic dysfunction of pelvic region: Secondary | ICD-10-CM | POA: Diagnosis not present

## 2017-07-28 DIAGNOSIS — M5441 Lumbago with sciatica, right side: Secondary | ICD-10-CM | POA: Diagnosis not present

## 2017-07-28 DIAGNOSIS — M9903 Segmental and somatic dysfunction of lumbar region: Secondary | ICD-10-CM | POA: Diagnosis not present

## 2017-07-28 DIAGNOSIS — M9902 Segmental and somatic dysfunction of thoracic region: Secondary | ICD-10-CM | POA: Diagnosis not present

## 2017-08-29 ENCOUNTER — Telehealth: Payer: Self-pay | Admitting: Emergency Medicine

## 2017-08-29 NOTE — Telephone Encounter (Signed)
Called patient to schedule AWV. Pt declined at this time.

## 2017-09-03 ENCOUNTER — Other Ambulatory Visit: Payer: Self-pay | Admitting: Internal Medicine

## 2017-10-12 DIAGNOSIS — C44311 Basal cell carcinoma of skin of nose: Secondary | ICD-10-CM | POA: Diagnosis not present

## 2017-10-13 NOTE — Progress Notes (Signed)
[facility] [address] [place], [place] [address] Phone: [phone] Subjective:     CC: Right hip  [id number]  Danielle Frazier is a 84 y.o. female coming in with complaint of right hip pain. States that the pain started in the groin. No numbness and tingling noted in the toes. Pain radiates down the leg. Also gets stiff. Uses a cane. Feels pressure in groin when she coughs. Not TTP.   Onset- 1 year Location- Posterior hip pain Duration-been normal saline and seems to be worse when having pain. Character- Dull  Aggravating factors- sitting for a long time, walking, stairs Reliving factors- Topical Therapies tried-  Severity-7 out of 10     Past Medical History:  Diagnosis Date  . Diabetes mellitus    type II  . Gout   . Hyperlipidemia   . Hypertension   . LBP (low back pain)    foraminal narrowing at L4-5 and disc bulge at L5- S1 on MRI 6-08  . Renal insufficiency   . Urinary incontinence    No past surgical history on file. Social History   Socioeconomic History  . Marital status: Married    Spouse name: Not on file  . Number of children: Not on file  . Years of education: Not on file  . Highest education level: Not on file  Occupational History  . Not on file  Social Needs  . Financial resource strain: Not on file  . Food insecurity:    Worry: Not on file    Inability: Not on file  . Transportation needs:    Medical: Not on file    Non-medical: Not on file  Tobacco Use  . Smoking status: Never Smoker  . Smokeless tobacco: Never Used  Substance and Sexual Activity  . Alcohol use: No  . Drug use: No  . Sexual activity: Not Currently  Lifestyle  . Physical activity:    Days per week: Not on file    Minutes per session: Not on file  . Stress: Not on file  Relationships  . Social connections:    Talks on phone: Not on file    Gets together: Not on file    Attends religious service: Not on file    Active member of  club or organization: Not on file    Attends meetings of clubs or organizations: Not on file    Relationship status: Not on file  Other Topics Concern  . Not on file  Social History Narrative  . Not on file   Allergies  Allergen Reactions  . Crestor [Rosuvastatin]     Muscle aches  . Metformin     REACTION: renal damage   Family History  Problem Relation Age of Onset  . Diabetes Other   . Hypertension Other      Past medical history, social, surgical and family history all reviewed in electronic medical record.  No pertanent information unless stated regarding to the chief complaint.   Review of Systems:Review of systems updated and as accurate as of 10/16/17  No headache, visual changes, nausea, vomiting, diarrhea, constipation, dizziness, abdominal pain, skin rash, fevers, chills, night sweats, weight loss, swollen lymph nodes,joint swelling, chest pain, shortness of breath, mood changes.  Positive muscle aches  Objective  Blood pressure (!) 152/78, pulse 63, height 5\' 6"  (1.676 m), weight 188 lb (85.3 kg), SpO2 95 %. Systems examined below as of 10/16/17   General: No apparent distress alert and oriented  x3 mood and affect normal, dressed appropriately.  HEENT: Pupils equal, extraocular movements intact  Respiratory: Patient's speak in full sentences and does not appear short of breath  Cardiovascular: No lower extremity edema, non tender, no erythema  Skin: Warm dry intact with no signs of infection or rash on extremities or on axial skeleton.  Abdomen: Soft no bulges noted.  Stool palpated in the right lower quadrant  Neuro: Cranial nerves II through XII are intact, neurovascularly intact in all extremities with 2+ DTRs and 2+ pulses.  Lymph: No lymphadenopathy of posterior or anterior cervical chain or axillae bilaterally.  Gait antalgic gait MSK:  tender with limited range of motion and symmetric strength and tone of shoulders, elbows, wrist, knee and ankles  bilaterally.  Right hip exam shows the patient has some limited range of motion with external rotation lacking last 20 degrees.  Internal rotation lacks last 5 degrees.  Positive straight leg test on the right side.  Patient does have some radicular symptoms in the hamstring area.  Patient's back has some degenerative scoliosis noted with loss of lordosis.  Nontender on exam.  Deep tendon reflexes are intact.  [id number]; 15 additional minutes spent for Therapeutic exercises as stated in above notes.  This included exercises focusing on stretching, strengthening, with significant focus on eccentric aspects.   Long term goals include an improvement in range of motion, strength, endurance as well as avoiding reinjury. Patient's frequency would include in 1-2 times a day, 3-5 times a week for a duration of 6-12 weeks. Hip strengthening exercises which included:  Pelvic tilt/bracing to help with proper recruitment of the lower abs and pelvic floor muscles  Glute strengthening to properly contract glutes without over-engaging low back and hamstrings - prone hip extension and glute bridge exercises Proper stretching techniques to increase effectiveness for the hip flexors, groin, quads, piriformic and low back when appropriate    Proper technique shown and discussed handout in great detail with ATC.  All questions were discussed and answered.     Impression and Recommendations:     This case required medical decision making of moderate complexity.      Note: This dictation was prepared with Dragon dictation along with smaller phrase technology. Any transcriptional errors that result from this process are unintentional.

## 2017-10-16 ENCOUNTER — Ambulatory Visit: Payer: Medicare Other | Admitting: Family Medicine

## 2017-10-16 ENCOUNTER — Ambulatory Visit: Payer: Self-pay

## 2017-10-16 ENCOUNTER — Other Ambulatory Visit: Payer: Medicare Other

## 2017-10-16 ENCOUNTER — Ambulatory Visit (INDEPENDENT_AMBULATORY_CARE_PROVIDER_SITE_OTHER)
Admission: RE | Admit: 2017-10-16 | Discharge: 2017-10-16 | Disposition: A | Discharge status: Home / Self Care | Payer: Medicare Other | Source: Ambulatory Visit | Attending: Family Medicine | Admitting: Family Medicine

## 2017-10-16 ENCOUNTER — Encounter: Payer: Self-pay | Admitting: Family Medicine

## 2017-10-16 ENCOUNTER — Encounter

## 2017-10-16 VITALS — BP 152/78 | HR 63 | Ht 66.0 in | Wt 188.0 lb

## 2017-10-16 DIAGNOSIS — M25551 Pain in right hip: Secondary | ICD-10-CM

## 2017-10-16 DIAGNOSIS — M545 Low back pain: Secondary | ICD-10-CM | POA: Diagnosis not present

## 2017-10-16 DIAGNOSIS — M1611 Unilateral primary osteoarthritis, right hip: Secondary | ICD-10-CM | POA: Diagnosis not present

## 2017-10-16 HISTORY — DX: Pain in right hip: M25.551

## 2017-10-16 MED ORDER — GABAPENTIN 100 MG PO CAPS: 200.0000 mg | ORAL_CAPSULE | Freq: Every day | ORAL | 3 refills | Status: DC

## 2017-10-16 NOTE — Patient Instructions (Addendum)
Good to see you  xrays downstairs  We will get ultrasound of the hip Ice is your friend. Ice 20 minutes 2 times daily. Usually after activity and before bed.  Stay active.  Exercises 3 times a week.  Gabapentin 200mg  at night Over the counter get  Vitamin D 2000 Iu daily  Tart cherry extract any dose at night See me again in 3-4 weeks

## 2017-10-16 NOTE — Assessment & Plan Note (Signed)
Right hip pain.  Severe tightness with Corky Sox test.  Patient though does have fairly good internal range of motion.  I am concerned for the possibility of a hernia and will send for an abdominal ultrasound limited.  We discussed icing regimen and home exercises.  Discussed which activities to doing which wants to avoid.  Patient is going to increase activity as tolerated.  Discussed posture and ergonomics.  Patient has had difficulty with renal insufficiency as well and has had renal cysts.  Depending on findings may need advanced imaging.  Patient given home exercises.  Follow-up again in 4 weeks

## 2017-10-24 ENCOUNTER — Other Ambulatory Visit: Payer: Self-pay | Admitting: Internal Medicine

## 2017-11-06 ENCOUNTER — Other Ambulatory Visit: Payer: Self-pay | Admitting: Internal Medicine

## 2017-11-06 NOTE — Progress Notes (Signed)
[name] [address] [place], [place] [address] Phone: [phone] Subjective:   Fontaine No, am serving as a scribe for Dr. Hulan Saas.     CC: Right hip pain  [id number]  Danielle Frazier is a 84 y.o. female coming in with complaint of right hip pain. She still notes a stiffness after sitting for prolonged periods. Pain with sit to stand and in the morning when she wakes up. Is using supplements and exercising. Does note that her ankles are swelling since starting the medications recommended to her.       Past Medical History:  Diagnosis Date  . Diabetes mellitus    type II  . Gout   . Hyperlipidemia   . Hypertension   . LBP (low back pain)    foraminal narrowing at L4-5 and disc bulge at L5- S1 on MRI 6-08  . Renal insufficiency   . Urinary incontinence    History reviewed. No pertinent surgical history. Social History   Socioeconomic History  . Marital status: Married    Spouse name: Not on file  . Number of children: Not on file  . Years of education: Not on file  . Highest education level: Not on file  Occupational History  . Not on file  Social Needs  . Financial resource strain: Not on file  . Food insecurity:    Worry: Not on file    Inability: Not on file  . Transportation needs:    Medical: Not on file    Non-medical: Not on file  Tobacco Use  . Smoking status: Never Smoker  . Smokeless tobacco: Never Used  Substance and Sexual Activity  . Alcohol use: No  . Drug use: No  . Sexual activity: Not Currently  Lifestyle  . Physical activity:    Days per week: Not on file    Minutes per session: Not on file  . Stress: Not on file  Relationships  . Social connections:    Talks on phone: Not on file    Gets together: Not on file    Attends religious service: Not on file    Active member of club or organization: Not on file    Attends meetings of clubs or organizations: Not on file    Relationship status:  Not on file  Other Topics Concern  . Not on file  Social History Narrative  . Not on file   Allergies  Allergen Reactions  . Crestor [Rosuvastatin]     Muscle aches  . Metformin     REACTION: renal damage   Family History  Problem Relation Age of Onset  . Diabetes Other   . Hypertension Other      Current Outpatient Medications (Cardiovascular):  .  BYSTOLIC 5 MG tablet, TAKE 1 TABLET BY MOUTH DAILY .  lisinopril-hydrochlorothiazide (PRINZIDE,ZESTORETIC) 20-25 MG tablet, TAKE 1 TABLET BY MOUTH DAILY.   Current Outpatient Medications (Analgesics):  .  allopurinol (ZYLOPRIM) 300 MG tablet, TAKE 1/2 TABLET BY MOUTH DAILY   Current Outpatient Medications (Other):  .  Blood Glucose Monitoring Suppl (BAYER CONTOUR MONITOR) W/DEVICE KIT, 1 Act by Does not apply route 2 (two) times daily. .  clonazePAM (KLONOPIN) 0.5 MG tablet, TAKE 1 TABLET BY MOUTH TWICE A DAY .  gabapentin (NEURONTIN) 100 MG capsule, Take 2 capsules (200 mg total) by mouth at bedtime. Marland Kitchen  glucose blood (BAYER CONTOUR TEST) test strip, Use BID .  Linaclotide (LINZESS) 145 MCG CAPS capsule,  Take 1 capsule (145 mcg total) by mouth daily. .  Multiple Vitamins-Minerals (CENTRUM SILVER PO), Take 1 tablet by mouth daily.   .  penicillin v potassium (VEETID) 500 MG tablet, TAKE 2 TABLETS BY MOUTH NOW THEN 1 TABLET 4 TIMES A DAY UNTIL GONE .  ranitidine (ZANTAC) 300 MG tablet, Take 1 tablet (300 mg total) by mouth at bedtime.    Past medical history, social, surgical and family history all reviewed in electronic medical record.  No pertanent information unless stated regarding to the chief complaint.   Review of Systems:  No headache, visual changes, nausea, vomiting, diarrhea, constipation, dizziness, abdominal pain, skin rash, fevers, chills, night sweats, weight loss, swollen lymph nodes, body aches, joint swelling, muscle aches, chest pain, shortness of breath, mood changes.   Objective  Blood pressure (!)  160/112, pulse (!) 58, height '5\' 6"'  (1.676 m), weight 193 lb (87.5 kg), SpO2 94 %. Systems examined below as of    General: No apparent distress alert and oriented x3 mood and affect normal, dressed appropriately.  HEENT: Pupils equal, extraocular movements intact  Respiratory: Patient's speak in full sentences and does not appear short of breath  Cardiovascular: No lower extremity edema, non tender, no erythema  Skin: Warm dry intact with no signs of infection or rash on extremities or on axial skeleton.  Abdomen: Soft nontender  Neuro: Cranial nerves II through XII are intact, neurovascularly intact in all extremities with 2+ DTRs and 2+ pulses.  Lymph: No lymphadenopathy of posterior or anterior cervical chain or axillae bilaterally.  Gait mild antalgic MSK:  Non tender with full range of motion and good stability and symmetric strength and tone of shoulders, elbows, wrist,  knee and ankles bilaterally.  Hip pain more laterally than usual.  Significant tenderness over the greater trochanteric area.  Positive Faber test.  Mild decrease in internal range of motion.  Negative straight leg test   Procedure: Real-time Ultrasound Guided Injection of right greater trochanteric bursitis secondary to patient's body habitus Device: GE Logiq Q7 Ultrasound guided injection is preferred based studies that show increased duration, increased effect, greater accuracy, decreased procedural pain, increased response rate, and decreased cost with ultrasound guided versus blind injection.  Verbal informed consent obtained.  Time-out conducted.  Noted no overlying erythema, induration, or other signs of local infection.  Skin prepped in a sterile fashion.  Local anesthesia: Topical Ethyl chloride.  With sterile technique and under real time ultrasound guidance:  Greater trochanteric area was visualized and patient's bursa was noted. A 22-gauge 3 inch needle was inserted and 4 cc of 0.5% Marcaine and 1 cc of  Kenalog 40 mg/dL was injected. Pictures taken Completed without difficulty  Pain immediately resolved suggesting accurate placement of the medication.  Advised to call if fevers/chills, erythema, induration, drainage, or persistent bleeding.  Images permanently stored and available for review in the ultrasound unit.  Impression: Technically successful ultrasound guided injection.    Impression and Recommendations:     This case required medical decision making of moderate complexity. The above documentation has been reviewed and is accurate and complete Lyndal Pulley, DO       Note: This dictation was prepared with Dragon dictation along with smaller phrase technology. Any transcriptional errors that result from this process are unintentional.

## 2017-11-07 ENCOUNTER — Ambulatory Visit: Payer: Self-pay

## 2017-11-07 ENCOUNTER — Encounter: Payer: Self-pay | Admitting: Family Medicine

## 2017-11-07 ENCOUNTER — Ambulatory Visit: Payer: Medicare Other | Admitting: Family Medicine

## 2017-11-07 VITALS — BP 160/112 | HR 58 | Ht 66.0 in | Wt 193.0 lb

## 2017-11-07 DIAGNOSIS — M7061 Trochanteric bursitis, right hip: Secondary | ICD-10-CM

## 2017-11-07 DIAGNOSIS — M25551 Pain in right hip: Secondary | ICD-10-CM

## 2017-11-07 HISTORY — DX: Trochanteric bursitis, right hip: M70.61

## 2017-11-07 NOTE — Assessment & Plan Note (Signed)
Patient given injection today.  Discussed icing regimen and home exercises.  Given another trial of topical anti-inflammatories.  Sparingly secondary second kidney disease.  Discussed icing regimen.  Home exercises.  Follow-up again in 4 to 8 weeks

## 2017-11-07 NOTE — Patient Instructions (Signed)
Good to see you  Danielle Frazier is your friend. Ice 20 minutes 2 times daily. Usually after activity and before bed. Injected the side of th hip  Decrease gabapentin to 100mg  at night to help with the ankle swelling See me again in 4 weeks to make sure doing well

## 2017-11-29 ENCOUNTER — Other Ambulatory Visit: Payer: Self-pay | Admitting: Internal Medicine

## 2017-12-10 NOTE — Progress Notes (Signed)
[facility] [address], [place] [address] Phone: [phone] Subjective:    [name] am serving as a Education administrator for Dr. Hulan Frazier.   CC: Right hip pain  CWC:[id number]  Danielle Frazier is a 84 y.o. female coming in with complaint of right hip pain. States that her hips is not too bad. Still painful.  Patient states that the pain was significantly improved for 2 days and then came back.  Patient would state that she is about 30 to 40% better overall well.  Patient though feels that her back is seeming to be worsening.  Some radicular pain going down the leg.  Seems to stop near the knee.  Patient states that the gabapentin is okay with 1 pill but if she takes to [facility] she has too much swelling.     Past Medical History:  Diagnosis Date  . Diabetes mellitus    type II  . Gout   . Hyperlipidemia   . Hypertension   . LBP (low back pain)    foraminal narrowing at L4-5 and disc bulge at L5- S1 on MRI 6-08  . Renal insufficiency   . Urinary incontinence    No past surgical history on file. Social History   Socioeconomic History  . Marital status: Married    Spouse name: Not on file  . Number of children: Not on file  . Years of education: Not on file  . Highest education level: Not on file  Occupational History  . Not on file  Social Needs  . Financial resource strain: Not on file  . Food insecurity:    Worry: Not on file    Inability: Not on file  . Transportation needs:    Medical: Not on file    Non-medical: Not on file  Tobacco Use  . Smoking status: Never Smoker  . Smokeless tobacco: Never Used  Substance and Sexual Activity  . Alcohol use: No  . Drug use: No  . Sexual activity: Not Currently  Lifestyle  . Physical activity:    Days per week: Not on file    Minutes per session: Not on file  . Stress: Not on file  Relationships  . Social connections:    Talks on phone: Not on file    Gets together: Not on file    Attends religious service: Not on file    Active member of club or organization: Not on file    Attends meetings of clubs or organizations: Not on file    Relationship status: Not on file  Other Topics Concern  . Not on file  Social History Narrative  . Not on file   Allergies  Allergen Reactions  . Crestor [Rosuvastatin]     Muscle aches  . Metformin     REACTION: renal damage   Family History  Problem Relation Age of Onset  . Diabetes Other   . Hypertension Other      Current Outpatient Medications (Cardiovascular):  .  BYSTOLIC 5 MG tablet, TAKE 1 TABLET BY MOUTH DAILY .  lisinopril-hydrochlorothiazide (PRINZIDE,ZESTORETIC) 20-25 MG tablet, TAKE 1 TABLET BY MOUTH DAILY.   Current Outpatient Medications (Analgesics):  .  allopurinol (ZYLOPRIM) 300 MG tablet, TAKE 1/2 TABLET BY MOUTH DAILY   Current Outpatient Medications (Other):  .  Blood Glucose Monitoring Suppl (BAYER CONTOUR MONITOR) W/DEVICE KIT, 1 Act by Does not apply route 2 (two) times daily. .  clonazePAM (KLONOPIN) 0.5 MG tablet, TAKE  1 TABLET BY MOUTH TWICE A DAY .  gabapentin (NEURONTIN) 100 MG capsule, Take 2 capsules (200 mg total) by mouth at bedtime. Marland Kitchen  glucose blood (BAYER CONTOUR TEST) test strip, Use BID .  Linaclotide (LINZESS) 145 MCG CAPS capsule, Take 1 capsule (145 mcg total) by mouth daily. .  Multiple Vitamins-Minerals (CENTRUM SILVER PO), Take 1 tablet by mouth daily.   .  penicillin v potassium (VEETID) 500 MG tablet, TAKE 2 TABLETS BY MOUTH NOW THEN 1 TABLET 4 TIMES A DAY UNTIL GONE .  ranitidine (ZANTAC) 300 MG tablet, Take 1 tablet (300 mg total) by mouth at bedtime.    Past medical history, social, surgical and family history all reviewed in electronic medical record.  No pertanent information unless stated regarding to the chief complaint.   Review of Systems:  No headache, visual changes, nausea, vomiting, diarrhea, constipation, dizziness, abdominal pain, skin rash, fevers, chills,  night sweats, weight loss, swollen lymph nodes, body aches, , chest pain, shortness of breath, mood changes.  Positive muscle aches and joint swelling  Objective  Blood pressure (!) 150/80, pulse 69, height 5' 6" (1.676 m), weight 189 lb (85.7 kg), SpO2 95 %.   General: No apparent distress alert and oriented x3 mood and affect normal, dressed appropriately.  HEENT: Pupils equal, extraocular movements intact  Respiratory: Patient's speak in full sentences and does not appear short of breath  Cardiovascular: Trace lower extremity edema, non tender, no erythema  Skin: Warm dry intact with no signs of infection or rash on extremities or on axial skeleton.  Abdomen: Soft nontender  Neuro: Cranial nerves II through XII are intact, neurovascularly intact in all extremities with 2+ DTRs and 2+ pulses.  Lymph: No lymphadenopathy of posterior or anterior cervical chain or axillae bilaterally.  Gait antalgic MSK:  tender with mild limited range of motion and good stability and symmetric strength and tone of shoulders, elbows, wrist, hip, knee and ankles bilaterally.  Patient does have arthritic changes noted multiple joints.  Patient back does have some degenerative scoliosis.  Loss of lordosis.  Loss of at least 10 degrees in all planes with very minimal extension noted.  Mild positive straight leg test on the right.  4 out of 5 strength of the right lower extremity compared to the contralateral side but deep tendon reflexes intact Lateral hip is less tender than previous exam.    Impression and Recommendations:     This case required medical decision making of moderate complexity. The above documentation has been reviewed and is accurate and complete Danielle Pulley, DO       Note: This dictation was prepared with Dragon dictation along with smaller phrase technology. Any transcriptional errors that result from this process are unintentional.

## 2017-12-11 ENCOUNTER — Ambulatory Visit: Payer: Medicare Other | Admitting: Family Medicine

## 2017-12-11 ENCOUNTER — Encounter: Payer: Self-pay | Admitting: Family Medicine

## 2017-12-11 VITALS — BP 150/80 | HR 69 | Ht 66.0 in | Wt 189.0 lb

## 2017-12-11 DIAGNOSIS — M545 Low back pain: Secondary | ICD-10-CM | POA: Diagnosis not present

## 2017-12-11 DIAGNOSIS — M5416 Radiculopathy, lumbar region: Secondary | ICD-10-CM

## 2017-12-11 HISTORY — DX: Radiculopathy, lumbar region: M54.16

## 2017-12-11 NOTE — Patient Instructions (Signed)
Good to see you  We will get a CT scan of your back  Ice is your friend.  After we get the CT scan we will call you and discuss next steps

## 2017-12-11 NOTE — Assessment & Plan Note (Signed)
Lumbar radiculopathy.  Discussed icing regimen, home exercise, which activities to do which wants to avoid.  We discussed avoiding certain activities but I do believe that the minimal improvement with the greater trochanteric injection the radicular symptoms likely contributing.  Patient could be a candidate for epidurals or nerve root injections.  Patient is in agreement with the plan.  After imaging will call and discuss.  Follow-up again in 4 weeks.  Continue the same medications. Spent  25 minutes with patient face-to-face and had greater than 50% of counseling including as described above in assessment and plan.

## 2018-01-08 ENCOUNTER — Ambulatory Visit (INDEPENDENT_AMBULATORY_CARE_PROVIDER_SITE_OTHER): Payer: Medicare Other | Admitting: Internal Medicine

## 2018-01-08 ENCOUNTER — Encounter: Payer: Self-pay | Admitting: Internal Medicine

## 2018-01-08 ENCOUNTER — Other Ambulatory Visit (INDEPENDENT_AMBULATORY_CARE_PROVIDER_SITE_OTHER): Payer: Medicare Other

## 2018-01-08 VITALS — BP 136/84 | HR 63 | Temp 97.7°F | Resp 16 | Ht 66.0 in | Wt 192.0 lb

## 2018-01-08 DIAGNOSIS — E1121 Type 2 diabetes mellitus with diabetic nephropathy: Secondary | ICD-10-CM

## 2018-01-08 DIAGNOSIS — N183 Chronic kidney disease, stage 3 unspecified: Secondary | ICD-10-CM

## 2018-01-08 DIAGNOSIS — I10 Essential (primary) hypertension: Secondary | ICD-10-CM

## 2018-01-08 DIAGNOSIS — Z23 Encounter for immunization: Secondary | ICD-10-CM | POA: Diagnosis not present

## 2018-01-08 LAB — BASIC METABOLIC PANEL
BUN: 44 mg/dL — ABNORMAL HIGH (ref 6–23)
CO2: 30 meq/L (ref 19–32)
Calcium: 10.4 mg/dL (ref 8.4–10.5)
Chloride: 105 mEq/L (ref 96–112)
Creatinine, Ser: 3.13 mg/dL — ABNORMAL HIGH (ref 0.40–1.20)
GFR: 15.02 mL/min — ABNORMAL LOW (ref >60.00)
Glucose, Bld: 113 mg/dL — ABNORMAL HIGH (ref 70–99)
POTASSIUM: 3.8 meq/L (ref 3.5–5.1)
Sodium: 143 mEq/L (ref 135–145)

## 2018-01-08 LAB — URINALYSIS, ROUTINE W REFLEX MICROSCOPIC
Bilirubin Urine: NEGATIVE
Ketones, ur: NEGATIVE
Leukocytes, UA: NEGATIVE
Nitrite: NEGATIVE
PH: 6 (ref 5.0–8.0)
SPECIFIC GRAVITY, URINE: 1.02 (ref 1.000–1.030)
TOTAL PROTEIN, URINE-UPE24: 100 — AB
URINE GLUCOSE: NEGATIVE
UROBILINOGEN UA: 0.2 (ref 0.0–1.0)

## 2018-01-08 LAB — CBC WITH DIFFERENTIAL/PLATELET
BASOS PCT: 0.6 % (ref 0.0–3.0)
Basophils Absolute: 0 10*3/uL (ref 0.0–0.1)
EOS PCT: 2.5 % (ref 0.0–5.0)
Eosinophils Absolute: 0.2 10*3/uL (ref 0.0–0.7)
HEMATOCRIT: 40.9 % (ref 36.0–46.0)
HEMOGLOBIN: 13.3 g/dL (ref 12.0–15.0)
Lymphocytes Relative: 26.7 % (ref 12.0–46.0)
Lymphs Abs: 1.8 10*3/uL (ref 0.7–4.0)
MCHC: 32.5 g/dL (ref 30.0–36.0)
MCV: 98.5 fl (ref 78.0–100.0)
MONO ABS: 0.4 10*3/uL (ref 0.1–1.0)
Monocytes Relative: 6.7 % (ref 3.0–12.0)
NEUTROS ABS: 4.2 10*3/uL (ref 1.4–7.7)
Neutrophils Relative %: 63.5 % (ref 43.0–77.0)
PLATELETS: 189 10*3/uL (ref 150.0–400.0)
RBC: 4.16 Mil/uL (ref 3.87–5.11)
RDW: 14.2 % (ref 11.5–15.5)
WBC: 6.6 10*3/uL (ref 4.0–10.5)

## 2018-01-08 LAB — MICROALBUMIN / CREATININE URINE RATIO
CREATININE, U: 67.1 mg/dL
MICROALB UR: 155.7 mg/dL — AB (ref 0.0–1.9)
MICROALB/CREAT RATIO: 232 mg/g — AB (ref 0.0–30.0)

## 2018-01-08 LAB — HEMOGLOBIN A1C: Hgb A1c MFr Bld: 6.1 % (ref 4.6–6.5)

## 2018-01-08 NOTE — Patient Instructions (Signed)

## 2018-01-08 NOTE — Progress Notes (Signed)
Subjective:  Patient ID: Danielle Frazier, female    DOB: 1930/11/16  Age: 84 y.o. MRN: [id number]  CC: Hypertension and Diabetes   HPI Danielle Frazier presents for f/up - She feels well today and offers no complaints.  She tells me her blood pressure and her blood sugar have been well controlled.  Outpatient Medications Prior to Visit  Medication Sig Dispense Refill  . allopurinol (ZYLOPRIM) 300 MG tablet TAKE 1/2 TABLET BY MOUTH DAILY 45 tablet 1  . Blood Glucose Monitoring Suppl (BAYER CONTOUR MONITOR) W/DEVICE KIT 1 Act by Does not apply route 2 (two) times daily. 100 kit 0  . BYSTOLIC 5 MG tablet TAKE 1 TABLET BY MOUTH DAILY 90 tablet 1  . clonazePAM (KLONOPIN) 0.5 MG tablet TAKE 1 TABLET BY MOUTH TWICE A DAY 60 tablet 3  . gabapentin (NEURONTIN) 100 MG capsule Take 2 capsules (200 mg total) by mouth at bedtime. 60 capsule 3  . glucose blood (BAYER CONTOUR TEST) test strip Use BID 100 each 11  . Linaclotide (LINZESS) 145 MCG CAPS capsule Take 1 capsule (145 mcg total) by mouth daily. 35 capsule 0  . lisinopril-hydrochlorothiazide (PRINZIDE,ZESTORETIC) 20-25 MG tablet TAKE 1 TABLET BY MOUTH DAILY. 90 tablet 1  . Multiple Vitamins-Minerals (CENTRUM SILVER PO) Take 1 tablet by mouth daily.      . penicillin v potassium (VEETID) 500 MG tablet TAKE 2 TABLETS BY MOUTH NOW THEN 1 TABLET 4 TIMES A DAY UNTIL GONE  1  . ranitidine (ZANTAC) 300 MG tablet Take 1 tablet (300 mg total) by mouth at bedtime. 90 tablet 3   No facility-administered medications prior to visit.     ROS Review of Systems  Constitutional: Negative for diaphoresis and fatigue.  HENT: Negative.   Eyes: Negative for visual disturbance.  Respiratory: Negative for cough, chest tightness, shortness of breath and wheezing.   Cardiovascular: Negative for chest pain, palpitations and leg swelling.  Gastrointestinal: Negative for abdominal pain, constipation, diarrhea, nausea and vomiting.  Endocrine: Negative.     Genitourinary: Negative.  Negative for difficulty urinating and dysuria.  Musculoskeletal: Negative.  Negative for arthralgias and myalgias.  Skin: Negative.  Negative for color change, pallor and rash.  Neurological: Negative.  Negative for dizziness, weakness and light-headedness.  Hematological: Negative for adenopathy. Does not bruise/bleed easily.  Psychiatric/Behavioral: Negative.     Objective:  BP 136/84   Pulse 63   Temp 97.7 F (36.5 C) (Oral)   Resp 16   Ht _0  (1.676 m)   Wt 192 lb (87.1 kg)   SpO2 96%   BMI 30.99 kg/m   BP Readings from Last 3 Encounters:  01/08/18 136/84  12/11/17 (!) 150/80  11/07/17 (!) 160/112    Wt Readings from Last 3 Encounters:  01/08/18 192 lb (87.1 kg)  12/11/17 189 lb (85.7 kg)  11/07/17 193 lb (87.5 kg)    Physical Exam  Constitutional: She is oriented to person, place, and time. No distress.  HENT:  Mouth/Throat: Oropharynx is clear and moist. No oropharyngeal exudate.  Eyes: Conjunctivae are normal. No scleral icterus.  Neck: Normal range of motion. Neck supple. No JVD present. No thyromegaly present.  Cardiovascular: Normal rate, regular rhythm and normal heart sounds. Exam reveals no gallop.  No murmur heard. Pulmonary/Chest: Effort normal and breath sounds normal. No respiratory distress. She has no wheezes. She has no rales.  Abdominal: Soft. Bowel sounds are normal. She exhibits no mass. There is no hepatosplenomegaly. There is no tenderness.  Musculoskeletal: Normal range of motion. She exhibits no edema, tenderness or deformity.  Lymphadenopathy:    She has no cervical adenopathy.  Neurological: She is alert and oriented to person, place, and time.  Skin: Skin is warm and dry. She is not diaphoretic.  Vitals reviewed.   Lab Results  Component Value Date   WBC 6.6 01/08/2018   HGB 13.3 01/08/2018   HCT 40.9 01/08/2018   PLT 189.0 01/08/2018   GLUCOSE 113 (H) 01/08/2018   CHOL 146 07/05/2017   TRIG 188.0  (H) 07/05/2017   HDL 43.60 07/05/2017   LDLDIRECT 90.0 07/06/2015   LDLCALC 65 07/05/2017   ALT 28 07/06/2015   AST 27 07/06/2015   NA 143 01/08/2018   K 3.8 01/08/2018   CL 105 01/08/2018   CREATININE 3.13 (H) 01/08/2018   BUN 44 (H) 01/08/2018   CO2 30 01/08/2018   TSH 2.96 07/05/2017   HGBA1C 6.1 01/08/2018   MICROALBUR 155.7 (H) 01/08/2018    Dg Lumbar Spine Complete  Result Date: 10/16/2017 CLINICAL DATA:  Chronic low back pain. EXAM: LUMBAR SPINE - COMPLETE 4+ VIEW COMPARISON:  Acute abdominal series 04/24/2014. MRI of the lumbar spine 09/10/2006 FINDINGS: Progressive grade 1 anterolisthesis is present at L4-5. There are marked endplate changes at M2-1. AP alignment is otherwise anatomic. Vertebral body heights are maintained. Moderate facet hypertrophy at L4-5 and L5-S1 is worse on the right. Aortic atherosclerosis is present. IMPRESSION: 1. Grade 1 anterolisthesis at L4-5 demonstrates progression since earlier exams. 2. Moderate bilateral facet hypertrophy at L4-5 and L5-S1 is worse on right. 3. These findings likely contribute to chronic pain. 4. No acute abnormality. 5. Atherosclerosis. Electronically Signed   By: San Morelle M.D.   On: 10/16/2017 15:44   [place] Limited Joint Space Structures Low Right  Result Date: 10/18/2017 No images saved  Dg Hip Unilat With Pelvis 2-3 Views Right  Result Date: 10/16/2017 CLINICAL DATA:  Chronic pain, no known injury EXAM: DG HIP (WITH OR WITHOUT PELVIS) 2-3V RIGHT COMPARISON:  None. FINDINGS: No acute fracture or dislocation. No aggressive osseous lesion. Course thickened trabecula of the right hemipelvis as can be seen with Paget's disease. Mild joint space narrowing of the right hip. IMPRESSION: 1. Mild osteoarthritis of the right hip. Electronically Signed   By: Kathreen Devoid   On: 10/16/2017 15:42    Assessment & Plan:   [name] was seen today for hypertension and diabetes.  Diagnoses and all orders for this visit:  Chronic renal  insufficiency, stage III (moderate) ([place])- Her renal function is stable.  Will continue with strict control the blood pressure and the blood sugar.  She agrees to avoid nephrotoxic agents. -     CBC with Differential/Platelet; Future -     Urinalysis, Routine w reflex microscopic; Future  Type 2 diabetes mellitus with diabetic nephropathy, without long-term current use of insulin ([place])- Her blood sugar is adequately well controlled.  Medical therapy is not indicated. -     Hemoglobin A1c; Future -     Microalbumin / creatinine urine ratio; Future  Essential hypertension- Her blood pressure is well controlled.  Electrolytes are normal.  Renal function is stable.  We will continue the current regimen. -     CBC with Differential/Platelet; Future -     Basic metabolic panel; Future -     Urinalysis, Routine w reflex microscopic; Future  Morbid obesity ([place])- She is working on her lifestyle modifications to lose weight.  Other orders -  Flu vaccine HIGH DOSE PF   I am having Danielle Frazier maintain her Multiple Vitamins-Minerals (CENTRUM SILVER PO), BAYER CONTOUR MONITOR, glucose blood, linaclotide, ranitidine, penicillin v potassium, gabapentin, clonazePAM, BYSTOLIC, allopurinol, and lisinopril-hydrochlorothiazide.  No orders of the defined types were placed in this encounter.    Follow-up: Return in about 6 months (around 07/10/2018).  [name], MD

## 2018-01-10 ENCOUNTER — Telehealth: Payer: Self-pay | Admitting: Family Medicine

## 2018-01-10 NOTE — Telephone Encounter (Signed)
Called pt, provided her with [facility]'s phone number & advised her to call their office to schedule an appt.

## 2018-01-10 NOTE — Telephone Encounter (Signed)
Copied from [place] [phone]. Topic: General - Other >> Jan 10, 2018 12:56 PM Janace Aris A wrote: Reason for CRM: pt called in, she wanted to know if she can still have the referral for the CT scan of her right hip.   I see that the referral was started, but pt says no one ever reached out to her to schedule anything.   Please advise

## 2018-01-12 ENCOUNTER — Other Ambulatory Visit: Payer: Self-pay | Admitting: Family Medicine

## 2018-01-12 DIAGNOSIS — M545 Low back pain, unspecified: Secondary | ICD-10-CM

## 2018-01-19 ENCOUNTER — Other Ambulatory Visit: Payer: Self-pay | Admitting: Family Medicine

## 2018-01-19 DIAGNOSIS — M545 Low back pain, unspecified: Secondary | ICD-10-CM

## 2018-01-19 DIAGNOSIS — G8929 Other chronic pain: Secondary | ICD-10-CM

## 2018-01-23 ENCOUNTER — Ambulatory Visit
Admission: RE | Admit: 2018-01-23 | Discharge: 2018-01-23 | Disposition: A | Discharge status: Home / Self Care | Payer: Medicare Other | Source: Ambulatory Visit | Attending: Family Medicine | Admitting: Family Medicine

## 2018-01-23 DIAGNOSIS — M545 Low back pain, unspecified: Secondary | ICD-10-CM

## 2018-01-23 DIAGNOSIS — M48061 Spinal stenosis, lumbar region without neurogenic claudication: Secondary | ICD-10-CM | POA: Diagnosis not present

## 2018-01-23 DIAGNOSIS — G8929 Other chronic pain: Secondary | ICD-10-CM

## 2018-01-23 MED ORDER — IOPAMIDOL (ISOVUE-M 200) INJECTION 41%
15.0000 mL | Freq: Once | INTRAMUSCULAR | Status: AC
  Administered 2018-01-23: 15 mL via INTRATHECAL

## 2018-01-23 MED ORDER — DIAZEPAM 5 MG PO TABS
5.0000 mg | ORAL_TABLET | Freq: Once | ORAL | Status: AC
  Administered 2018-01-23: 5 mg via ORAL

## 2018-01-23 NOTE — Discharge Instructions (Signed)

## 2018-01-26 ENCOUNTER — Other Ambulatory Visit: Payer: Self-pay

## 2018-01-26 ENCOUNTER — Telehealth: Payer: Self-pay

## 2018-01-26 DIAGNOSIS — G8929 Other chronic pain: Secondary | ICD-10-CM

## 2018-01-26 DIAGNOSIS — M545 Low back pain: Principal | ICD-10-CM

## 2018-01-26 NOTE — Telephone Encounter (Signed)
-----   Message from Lyndal Pulley, DO sent at 01/24/2018  8:58 PM EST ----- Sever spinal stenosis.  Please call patient and tell her the above and need injection.  Recommend L5/S1 epidural and will please order.  Thank you

## 2018-01-26 NOTE — Telephone Encounter (Signed)
Called patient to tell her results. Ordered L5-S1 epidural.

## 2018-02-06 ENCOUNTER — Ambulatory Visit
Admission: RE | Admit: 2018-02-06 | Discharge: 2018-02-06 | Disposition: A | Discharge status: Home / Self Care | Payer: Medicare Other | Source: Ambulatory Visit | Attending: Family Medicine | Admitting: Family Medicine

## 2018-02-06 DIAGNOSIS — M5126 Other intervertebral disc displacement, lumbar region: Secondary | ICD-10-CM | POA: Diagnosis not present

## 2018-02-06 DIAGNOSIS — G8929 Other chronic pain: Secondary | ICD-10-CM

## 2018-02-06 DIAGNOSIS — M545 Low back pain: Principal | ICD-10-CM

## 2018-02-06 MED ORDER — METHYLPREDNISOLONE ACETATE 40 MG/ML INJ SUSP (RADIOLOG
120.0000 mg | Freq: Once | INTRAMUSCULAR | Status: AC
  Administered 2018-02-06: 120 mg via EPIDURAL

## 2018-02-06 MED ORDER — IOPAMIDOL (ISOVUE-M 200) INJECTION 41%
1.0000 mL | Freq: Once | INTRAMUSCULAR | Status: AC
  Administered 2018-02-06: 1 mL via EPIDURAL

## 2018-02-06 NOTE — Discharge Instructions (Signed)

## 2018-03-22 LAB — HM DIABETES EYE EXAM

## 2018-03-24 ENCOUNTER — Encounter: Payer: Self-pay | Admitting: Internal Medicine

## 2018-03-24 ENCOUNTER — Ambulatory Visit (INDEPENDENT_AMBULATORY_CARE_PROVIDER_SITE_OTHER): Payer: Medicare Other | Admitting: Internal Medicine

## 2018-03-24 VITALS — BP 124/82 | HR 102 | Temp 97.6°F | Ht 66.0 in | Wt 191.0 lb

## 2018-03-24 DIAGNOSIS — H6120 Impacted cerumen, unspecified ear: Secondary | ICD-10-CM

## 2018-03-24 DIAGNOSIS — R6889 Other general symptoms and signs: Secondary | ICD-10-CM | POA: Diagnosis not present

## 2018-03-24 DIAGNOSIS — H6690 Otitis media, unspecified, unspecified ear: Secondary | ICD-10-CM

## 2018-03-24 DIAGNOSIS — H6691 Otitis media, unspecified, right ear: Secondary | ICD-10-CM

## 2018-03-24 DIAGNOSIS — J209 Acute bronchitis, unspecified: Secondary | ICD-10-CM

## 2018-03-24 DIAGNOSIS — E1121 Type 2 diabetes mellitus with diabetic nephropathy: Secondary | ICD-10-CM | POA: Diagnosis not present

## 2018-03-24 DIAGNOSIS — H6122 Impacted cerumen, left ear: Secondary | ICD-10-CM | POA: Diagnosis not present

## 2018-03-24 DIAGNOSIS — I10 Essential (primary) hypertension: Secondary | ICD-10-CM

## 2018-03-24 HISTORY — DX: Acute bronchitis, unspecified: J20.9

## 2018-03-24 HISTORY — DX: Impacted cerumen, unspecified ear: H61.20

## 2018-03-24 HISTORY — DX: Otitis media, unspecified, unspecified ear: H66.90

## 2018-03-24 LAB — POCT INFLUENZA A/B
Influenza A, POC: NEGATIVE
Influenza B, POC: NEGATIVE

## 2018-03-24 MED ORDER — CEFDINIR 300 MG PO CAPS: 300.0000 mg | ORAL_CAPSULE | Freq: Two times a day (BID) | ORAL | 0 refills | Status: DC

## 2018-03-24 MED ORDER — BENZONATATE 200 MG PO CAPS: 200.0000 mg | ORAL_CAPSULE | Freq: Two times a day (BID) | ORAL | 0 refills | Status: DC | PRN

## 2018-03-24 NOTE — Assessment & Plan Note (Signed)
BP Readings from Last 3 Encounters:  03/24/18 124/82  02/06/18 (!) 208/94  01/23/18 (!) 212/103

## 2018-03-24 NOTE — Patient Instructions (Signed)
F/u w/Dr Lemont Fillers can use over-the-counter  "cold" medicines  such as  "Afrin" nasal spray for nasal congestion as directed. Use " Delsym" or" Robitussin" cough syrup varietis for cough.  You can use plain "Tylenol" for fever, chills and achyness. Use Halls or Ricola cough drops.     Please, make an appointment if you are not better or if you're worse.

## 2018-03-24 NOTE — Assessment & Plan Note (Signed)
Cefdinir po Tessalon po

## 2018-03-24 NOTE — Assessment & Plan Note (Addendum)
R and possibly L (unable to see due to wax) Cefdinir

## 2018-03-24 NOTE — Assessment & Plan Note (Addendum)
RTC 2 wks for irrigation

## 2018-03-24 NOTE — Progress Notes (Signed)
Subjective:  Patient ID: Danielle Frazier, female    DOB: [birth year]  Age: 85 y.o. MRN: [id number]  CC: No chief complaint on file.   HPI HAYDEE JABBOUR presents for URI sx's x1 wk C/o L>>R ear pain x 1 day  Outpatient Medications Prior to Visit  Medication Sig Dispense Refill  . allopurinol (ZYLOPRIM) 300 MG tablet TAKE 1/2 TABLET BY MOUTH DAILY 45 tablet 1  . Blood Glucose Monitoring Suppl (BAYER CONTOUR MONITOR) W/DEVICE KIT 1 Act by Does not apply route 2 (two) times daily. 100 kit 0  . BYSTOLIC 5 MG tablet TAKE 1 TABLET BY MOUTH DAILY 90 tablet 1  . cholecalciferol (VITAMIN D) 1000 units tablet Take 2,000 Units by mouth daily.    . clonazePAM (KLONOPIN) 0.5 MG tablet TAKE 1 TABLET BY MOUTH TWICE A DAY 60 tablet 3  . gabapentin (NEURONTIN) 100 MG capsule Take 2 capsules (200 mg total) by mouth at bedtime. 60 capsule 3  . glucose blood (BAYER CONTOUR TEST) test strip Use BID 100 each 11  . Linaclotide (LINZESS) 145 MCG CAPS capsule Take 1 capsule (145 mcg total) by mouth daily. 35 capsule 0  . lisinopril-hydrochlorothiazide (PRINZIDE,ZESTORETIC) 20-25 MG tablet TAKE 1 TABLET BY MOUTH DAILY. 90 tablet 1  . Multiple Vitamins-Minerals (CENTRUM SILVER PO) Take 1 tablet by mouth daily.      . ranitidine (ZANTAC) 300 MG tablet Take 1 tablet (300 mg total) by mouth at bedtime. 90 tablet 3   No facility-administered medications prior to visit.     ROS: Review of Systems  Constitutional: Negative for activity change, appetite change, chills, fatigue and unexpected weight change.  HENT: Positive for congestion, ear pain and sinus pressure. Negative for mouth sores.   Eyes: Negative for visual disturbance.  Respiratory: Positive for cough and chest tightness.   Gastrointestinal: Negative for abdominal pain and nausea.  Genitourinary: Negative for difficulty urinating, frequency and vaginal pain.  Musculoskeletal: Negative for back pain and gait problem.  Skin: Negative for pallor and rash.   Neurological: Negative for dizziness, tremors, weakness, numbness and headaches.  Psychiatric/Behavioral: Negative for confusion and sleep disturbance.    Objective:  BP 124/82   Pulse (!) 102   Temp 97.6 F (36.4 C) (Oral)   Ht '5\' 6"'  (1.676 m)   Wt 191 lb (86.6 kg)   SpO2 91%   BMI 30.83 kg/m   BP Readings from Last 3 Encounters:  03/24/18 124/82  02/06/18 (!) 208/94  01/23/18 (!) 212/103    Wt Readings from Last 3 Encounters:  03/24/18 191 lb (86.6 kg)  01/08/18 192 lb (87.1 kg)  12/11/17 189 lb (85.7 kg)    Physical Exam Constitutional:      General: She is not in acute distress.    Appearance: She is well-developed.  HENT:     Head: Normocephalic.     Right Ear: External ear normal.     Left Ear: External ear normal. There is impacted cerumen.     Nose: Nose normal.  Eyes:     General:        Right eye: No discharge.        Left eye: No discharge.     Conjunctiva/sclera: Conjunctivae normal.     Pupils: Pupils are equal, round, and reactive to light.  Neck:     Musculoskeletal: Normal range of motion and neck supple.     Thyroid: No thyromegaly.     Vascular: No JVD.     Trachea:  No tracheal deviation.  Cardiovascular:     Rate and Rhythm: Normal rate and regular rhythm.     Heart sounds: Normal heart sounds.  Pulmonary:     Effort: No respiratory distress.     Breath sounds: No stridor. Rhonchi present. No wheezing.  Abdominal:     General: Bowel sounds are normal. There is no distension.     Palpations: Abdomen is soft. There is no mass.     Tenderness: There is no abdominal tenderness. There is no guarding or rebound.  Musculoskeletal:        General: No tenderness.  Lymphadenopathy:     Cervical: No cervical adenopathy.  Skin:    Findings: No erythema or rash.  Neurological:     Cranial Nerves: No cranial nerve deficit.     Motor: No abnormal muscle tone.     Coordination: Coordination normal.     Deep Tendon Reflexes: Reflexes normal.    Psychiatric:        Behavior: Behavior normal.        Thought Content: Thought content normal.        Judgment: Judgment normal.   R TM - red L ear w/wax  B ronchi eryth throat  Lab Results  Component Value Date   WBC 6.6 01/08/2018   HGB 13.3 01/08/2018   HCT 40.9 01/08/2018   PLT 189.0 01/08/2018   GLUCOSE 113 (H) 01/08/2018   CHOL 146 07/05/2017   TRIG 188.0 (H) 07/05/2017   HDL 43.60 07/05/2017   LDLDIRECT 90.0 07/06/2015   LDLCALC 65 07/05/2017   ALT 28 07/06/2015   AST 27 07/06/2015   NA 143 01/08/2018   K 3.8 01/08/2018   CL 105 01/08/2018   CREATININE 3.13 (H) 01/08/2018   BUN 44 (H) 01/08/2018   CO2 30 01/08/2018   TSH 2.96 07/05/2017   HGBA1C 6.1 01/08/2018   MICROALBUR 155.7 (H) 01/08/2018    Dg Inject Diag/thera/inc Needle/cath/plc Epi/lumb/sac W/img  Result Date: 02/06/2018 CLINICAL DATA:  Chronic low back pain. Unspecified back pain. 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 Right L5 and S1 radiculopathy. FLUOROSCOPY TIME:  Radiation Exposure Index (as provided by the fluoroscopic device): 24.0 uGy*m2 Fluoroscopy Time:  13 seconds Number of Acquired Images:  0 PROCEDURE: The procedure, risks, benefits, and alternatives were explained to the patient. Questions regarding the procedure were encouraged and answered. The patient understands and consents to the procedure. LUMBAR EPIDURAL INJECTION: An interlaminar approach was performed on right at L5-S1. The overlying skin was cleansed and anesthetized. A 20 gauge epidural needle was advanced using loss-of-resistance technique. DIAGNOSTIC EPIDURAL INJECTION: Injection of Isovue-M 200 shows a good epidural pattern with spread above and below the level of needle placement, primarily on the right no vascular opacification is seen. THERAPEUTIC EPIDURAL INJECTION: 120 mg of Depo-Medrol mixed with 3 mL 1% lidocaine were instilled. The procedure was well-tolerated, and the patient was discharged thirty minutes following  the injection in good condition. COMPLICATIONS: None IMPRESSION: Technically successful epidural injection on the right L5-S1 # 1 Electronically Signed   By: San Morelle M.D.   On: 02/06/2018 12:09    Assessment & Plan:   Diagnoses and all orders for this visit:  Flu-like symptoms -     POCT Influenza A/B     No orders of the defined types were placed in this encounter.    Follow-up: No follow-ups on file.  Walker Kehr, MD

## 2018-03-24 NOTE — Assessment & Plan Note (Signed)
CBGs good at home

## 2018-04-04 ENCOUNTER — Ambulatory Visit (INDEPENDENT_AMBULATORY_CARE_PROVIDER_SITE_OTHER): Payer: Medicare Other | Admitting: Family

## 2018-04-04 ENCOUNTER — Encounter: Payer: Self-pay | Admitting: Family

## 2018-04-04 VITALS — BP 122/82 | HR 92 | Temp 97.9°F | Ht 66.0 in | Wt 191.0 lb

## 2018-04-04 DIAGNOSIS — H6983 Other specified disorders of Eustachian tube, bilateral: Secondary | ICD-10-CM | POA: Diagnosis not present

## 2018-04-04 DIAGNOSIS — H6123 Impacted cerumen, bilateral: Secondary | ICD-10-CM

## 2018-04-04 DIAGNOSIS — K5909 Other constipation: Secondary | ICD-10-CM | POA: Diagnosis not present

## 2018-04-04 MED ORDER — PREDNISONE 20 MG PO TABS: 20.0000 mg | ORAL_TABLET | Freq: Every day | ORAL | 0 refills | Status: DC

## 2018-04-04 MED ORDER — FLUTICASONE PROPIONATE 50 MCG/ACT NA SUSP: 2.0000 | Freq: Every day | NASAL | 6 refills | Status: DC

## 2018-04-04 NOTE — Patient Instructions (Signed)

## 2018-04-04 NOTE — Progress Notes (Signed)
Patient consent obtained. ?Irrigation with water and peroxide performed on both ears. Full view of tympanic membranes after procedure.  ?Patient tolerated procedure well.  ? ?

## 2018-04-04 NOTE — Progress Notes (Signed)
Danielle Frazier is a 85 y.o. female with the following history as recorded in [employer]:  Patient Active Problem List   Diagnosis Date Noted  . Acute bronchitis 03/24/2018  . Cerumen impaction 03/24/2018  . Otitis media 03/24/2018  . Lumbar radiculopathy 12/11/2017  . Greater trochanteric bursitis of right hip 11/07/2017  . Gastroesophageal reflux disease without esophagitis 07/06/2015  . Routine general medical examination at a health care facility 07/06/2015  . Constipation, chronic 04/24/2013  . Morbid obesity ([place]) 03/22/2013  . 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 Bilateral renal cysts 09/07/2011  . Hyperlipidemia with target LDL less than 100 12/30/2009  . DM (diabetes mellitus), type 2 with renal complications ([place]) 27/78/2423  . GOUT 08/25/2008  . Essential hypertension 08/25/2008  . Chronic renal insufficiency, stage III (moderate) ([place]) 08/25/2008  . OAB (overactive bladder) 08/25/2008    Current Outpatient Medications  Medication Sig Dispense Refill  . allopurinol (ZYLOPRIM) 300 MG tablet TAKE 1/2 TABLET BY MOUTH DAILY 45 tablet 1  . Blood Glucose Monitoring Suppl (BAYER CONTOUR MONITOR) W/DEVICE KIT 1 Act by Does not apply route 2 (two) times daily. 100 kit 0  . BYSTOLIC 5 MG tablet TAKE 1 TABLET BY MOUTH DAILY 90 tablet 1  . cholecalciferol (VITAMIN D) 1000 units tablet Take 2,000 Units by mouth daily.    . clonazePAM (KLONOPIN) 0.5 MG tablet TAKE 1 TABLET BY MOUTH TWICE A DAY 60 tablet 3  . fluticasone (FLONASE) 50 MCG/ACT nasal spray Place 2 sprays into both nostrils daily. 16 g 6  . gabapentin (NEURONTIN) 100 MG capsule Take 2 capsules (200 mg total) by mouth at bedtime. 60 capsule 3  . glucose blood (BAYER CONTOUR TEST) test strip Use BID 100 each 11  . Linaclotide (LINZESS) 145 MCG CAPS capsule Take 1 capsule (145 mcg total) by mouth daily. 35 capsule 0  . lisinopril-hydrochlorothiazide (PRINZIDE,ZESTORETIC) 20-25 MG tablet TAKE 1 TABLET BY MOUTH DAILY. 90 tablet 1  .  Multiple Vitamins-Minerals (CENTRUM SILVER PO) Take 1 tablet by mouth daily.      . predniSONE (DELTASONE) 20 MG tablet Take 1 tablet (20 mg total) by mouth daily with breakfast. 5 tablet 0  . ranitidine (ZANTAC) 300 MG tablet Take 1 tablet (300 mg total) by mouth at bedtime. 90 tablet 3   No current facility-administered medications for this visit.     Allergies: Crestor [rosuvastatin] and Metformin  Past Medical History:  Diagnosis Date  . Diabetes mellitus    type II  . Gout   . Hyperlipidemia   . Hypertension   . LBP (low back pain)    foraminal narrowing at L4-5 and disc bulge at L5- S1 on MRI 6-08  . Renal insufficiency   . Urinary incontinence     History reviewed. No pertinent surgical history.  Family History  Problem Relation Age of Onset  . Diabetes Other   . Hypertension Other     Social History   Tobacco Use  . Smoking status: Never Smoker  . Smokeless tobacco: Never Used  Substance Use Topics  . Alcohol use: No    Subjective:  Patient complaining of sensation of decreased hearing; was seen earlier this month with URI symptoms/ sensation that ears were full/ clogged; complicated by ear wax in both ears; was treated with 10 day course of Omnicef and Tessalon Perles; ear problems have persisted- requesting to get her ears cleaned out today;  Also mentions problems with increased constipation recently; suspects due to increased use of cold  medication; wonders about samples of Linzess? Has used in the past with no difficulty;   Objective:  Vitals:   04/04/18 1112  BP: 122/82  Pulse: 92  Temp: 97.9 F (36.6 C)  TempSrc: Oral  SpO2: 96%  Weight: 191 lb (86.6 kg)  Height: '5\' 6"'  (1.676 m)    General: Well developed, well nourished, in no acute distress  Skin : Warm and dry.  Head: Normocephalic and atraumatic  Eyes: Sclera and conjunctiva clear; pupils round and reactive to light; extraocular movements intact  Ears:  both ears canal impacted with copious  hard wax, examination post ear lavage canal is clear and no bleeding or complications noted. L TM does look full/ congested;  Oropharynx: Pink, supple. No suspicious lesions  Neck: Supple without thyromegaly, adenopathy  Lungs: Respirations unlabored; clear to auscultation bilaterally without wheeze, rales, rhonchi  CVS exam: normal rate and regular rhythm.  Neurologic: Alert and oriented; speech intact; face symmetrical; moves all extremities well; CNII-XII intact without focal deficit   Assessment:  1. Bilateral impacted cerumen   2. Dysfunction of both eustachian tubes   3. Constipation, chronic     Plan:  1. & 2. Ear lavage completed with no difficulty; try treating with Prednisone and Flonase; if symptoms persist, to ENT; 3. Samples of Linzess as requested; follow-up if symptoms persist.   No follow-ups on file.  No orders of the defined types were placed in this encounter.   Requested Prescriptions   Signed Prescriptions Disp Refills  . fluticasone (FLONASE) 50 MCG/ACT nasal spray 16 g 6    Sig: Place 2 sprays into both nostrils daily.  . predniSONE (DELTASONE) 20 MG tablet 5 tablet 0    Sig: Take 1 tablet (20 mg total) by mouth daily with breakfast.

## 2018-04-18 ENCOUNTER — Other Ambulatory Visit: Payer: Self-pay | Admitting: Family

## 2018-04-18 ENCOUNTER — Telehealth: Payer: Self-pay

## 2018-04-18 DIAGNOSIS — H6980 Other specified disorders of Eustachian tube, unspecified ear: Secondary | ICD-10-CM

## 2018-04-18 NOTE — Telephone Encounter (Signed)
Called and left message for patient today regarding referral info.

## 2018-04-18 NOTE — Telephone Encounter (Signed)
Copied from [place] [id number]. Topic: Referral - Request for Referral >> Apr 18, 2018 11:08 AM Scherrie Gerlach wrote: Has patient seen PCP for this complaint? yes Pt saw Jodi Mourning 04/04/18 for ear complaint.  Advised if not better to call back to get referral to ENT. Pt states it is a little better, but still feels like something not quite right.  Would like to proceed with referral to ENT that laura recommends.

## 2018-04-18 NOTE — Telephone Encounter (Signed)
Referral done as requested

## 2018-04-30 DIAGNOSIS — H6980 Other specified disorders of Eustachian tube, unspecified ear: Secondary | ICD-10-CM | POA: Diagnosis not present

## 2018-05-07 ENCOUNTER — Other Ambulatory Visit: Payer: Self-pay | Admitting: Internal Medicine

## 2018-05-22 DIAGNOSIS — H6982 Other specified disorders of Eustachian tube, left ear: Secondary | ICD-10-CM | POA: Diagnosis not present

## 2018-05-22 DIAGNOSIS — H1033 Unspecified acute conjunctivitis, bilateral: Secondary | ICD-10-CM | POA: Diagnosis not present

## 2018-06-04 ENCOUNTER — Ambulatory Visit (INDEPENDENT_AMBULATORY_CARE_PROVIDER_SITE_OTHER): Payer: Medicare Other | Admitting: Internal Medicine

## 2018-06-04 ENCOUNTER — Ambulatory Visit: Payer: Self-pay | Admitting: *Deleted

## 2018-06-04 ENCOUNTER — Other Ambulatory Visit (INDEPENDENT_AMBULATORY_CARE_PROVIDER_SITE_OTHER): Payer: Medicare Other

## 2018-06-04 ENCOUNTER — Other Ambulatory Visit: Payer: Self-pay

## 2018-06-04 ENCOUNTER — Encounter: Payer: Self-pay | Admitting: Internal Medicine

## 2018-06-04 VITALS — BP 160/70 | HR 112 | Temp 98.0°F | Ht 66.0 in | Wt 189.0 lb

## 2018-06-04 DIAGNOSIS — K625 Hemorrhage of anus and rectum: Secondary | ICD-10-CM | POA: Insufficient documentation

## 2018-06-04 DIAGNOSIS — N183 Chronic kidney disease, stage 3 unspecified: Secondary | ICD-10-CM

## 2018-06-04 DIAGNOSIS — D62 Acute posthemorrhagic anemia: Secondary | ICD-10-CM

## 2018-06-04 DIAGNOSIS — E1121 Type 2 diabetes mellitus with diabetic nephropathy: Secondary | ICD-10-CM | POA: Diagnosis not present

## 2018-06-04 DIAGNOSIS — I10 Essential (primary) hypertension: Secondary | ICD-10-CM | POA: Diagnosis not present

## 2018-06-04 HISTORY — DX: Acute posthemorrhagic anemia: D62

## 2018-06-04 HISTORY — DX: Hemorrhage of anus and rectum: K62.5

## 2018-06-04 LAB — CBC WITH DIFFERENTIAL/PLATELET
BASOS ABS: 0.1 10*3/uL (ref 0.0–0.1)
BASOS PCT: 0.6 % (ref 0.0–3.0)
EOS ABS: 0.2 10*3/uL (ref 0.0–0.7)
Eosinophils Relative: 1.5 % (ref 0.0–5.0)
HEMATOCRIT: 27.3 % — AB (ref 36.0–46.0)
HEMOGLOBIN: 9 g/dL — AB (ref 12.0–15.0)
LYMPHS PCT: 18 % (ref 12.0–46.0)
Lymphs Abs: 1.8 10*3/uL (ref 0.7–4.0)
MCHC: 32.9 g/dL (ref 30.0–36.0)
MCV: 100.4 fl — ABNORMAL HIGH (ref 78.0–100.0)
Monocytes Absolute: 0.7 10*3/uL (ref 0.1–1.0)
Monocytes Relative: 7.1 % (ref 3.0–12.0)
Neutro Abs: 7.4 10*3/uL (ref 1.4–7.7)
Neutrophils Relative %: 72.8 % (ref 43.0–77.0)
Platelets: 244 10*3/uL (ref 150.0–400.0)
RDW: 14.4 % (ref 11.5–15.5)
WBC: 10.2 10*3/uL (ref 4.0–10.5)

## 2018-06-04 LAB — BASIC METABOLIC PANEL
BUN: 53 mg/dL — ABNORMAL HIGH (ref 6–23)
CHLORIDE: 105 meq/L (ref 96–112)
CO2: 26 mEq/L (ref 19–32)
CREATININE: 3.58 mg/dL — AB (ref 0.40–1.20)
Calcium: 9.8 mg/dL (ref 8.4–10.5)
GFR: 12.09 mL/min — CL (ref >60.00)
Glucose, Bld: 136 mg/dL — ABNORMAL HIGH (ref 70–99)
Potassium: 3.9 mEq/L (ref 3.5–5.1)
Sodium: 143 mEq/L (ref 135–145)

## 2018-06-04 LAB — HEMOGLOBIN A1C: HEMOGLOBIN A1C: 6 % (ref 4.6–6.5)

## 2018-06-04 NOTE — Telephone Encounter (Signed)
Pt reports blood in stools, onset Saturday. States only with BMs, "Gushing at times." States bright red with small "Gel like clots." States dark red "With wiping." Reports 5 episode over weekend. Has not had BM as of yet this AM.Denies abdominal pain, no fever or dizziness. Is not on anticoagulants. States "A little weaker than usual yesterday." No H/O hemorrhoids, no rectal discomfort. Does have H/O constipation but states has been going regularly. Appt made for today with Dr. Ronnald Ramp. Care advise given per protocol. Pt verbalizes understanding.  Reason for Disposition . MODERATE rectal bleeding (small blood clots, passing blood without stool, or toilet water turns red)  Answer Assessment - Initial Assessment Questions 1. APPEARANCE of BLOOD: "What color is it?" "Is it passed separately, on the surface of the stool, or mixed in with the stool?"      Bright red, dark when wiping 2. AMOUNT: "How much blood was passed?"      Gushing 3. FREQUENCY: "How many times has blood been passed with the stools?"      Total of 5, no BMs this AMas of yet 4. ONSET: "When was the blood first seen in the stools?" (Days or weeks)      Saturday 5. DIARRHEA: "Is there also some diarrhea?" If so, ask: "How many diarrhea stools were passed in past 24 hours?"      Loose 6. CONSTIPATION: "Do you have constipation?" If so, "How bad is it?"     no 7. RECURRENT SYMPTOMS: "Have you had blood in your stools before?" If so, ask: "When was the last time?" and "What happened that time?"    8. BLOOD THINNERS: "Do you take any blood thinners?" (e.g., Coumadin/warfarin, Pradaxa/dabigatran, aspirin)     no 9. OTHER SYMPTOMS: "Do you have any other symptoms?"  (e.g., abdominal pain, vomiting, dizziness, fever)    Weakness  Protocols used: RECTAL BLEEDING-A-AH

## 2018-06-04 NOTE — Patient Instructions (Signed)
Anemia  Anemia is a condition in which you do not have enough red blood cells or hemoglobin. Hemoglobin is a substance in red blood cells that carries oxygen. When you do not have enough red blood cells or hemoglobin (are anemic), your body cannot get enough oxygen and your organs may not work properly. As a result, you may feel very tired or have other problems. What are the causes? Common causes of anemia include:  Excessive bleeding. Anemia can be caused by excessive bleeding inside or outside the body, including bleeding from the intestine or from periods in women.  Poor nutrition.  Long-lasting (chronic) kidney, thyroid, and liver disease.  Bone marrow disorders.  Cancer and treatments for cancer.  HIV (human immunodeficiency virus) and AIDS (acquired immunodeficiency syndrome).  Treatments for HIV and AIDS.  Spleen problems.  Blood disorders.  Infections, medicines, and autoimmune disorders that destroy red blood cells. What are the signs or symptoms? Symptoms of this condition include:  Minor weakness.  Dizziness.  Headache.  Feeling heartbeats that are irregular or faster than normal (palpitations).  Shortness of breath, especially with exercise.  Paleness.  Cold sensitivity.  Indigestion.  Nausea.  Difficulty sleeping.  Difficulty concentrating. Symptoms may occur suddenly or develop slowly. If your anemia is mild, you may not have symptoms. How is this diagnosed? This condition is diagnosed based on:  Blood tests.  Your medical history.  A physical exam.  Bone marrow biopsy. Your health care provider may also check your stool (feces) for blood and may do additional testing to look for the cause of your bleeding. You may also have other tests, including:  Imaging tests, such as a CT scan or MRI.  Endoscopy.  Colonoscopy. How is this treated? Treatment for this condition depends on the cause. If you continue to lose a lot of blood, you may  need to be treated at a hospital. Treatment may include:  Taking supplements of iron, vitamin S31, or folic acid.  Taking a hormone medicine (erythropoietin) that can help to stimulate red blood cell growth.  Having a blood transfusion. This may be needed if you lose a lot of blood.  Making changes to your diet.  Having surgery to remove your spleen. Follow these instructions at home:  Take over-the-counter and prescription medicines only as told by your health care provider.  Take supplements only as told by your health care provider.  Follow any diet instructions that you were given.  Keep all follow-up visits as told by your health care provider. This is important. Contact a health care provider if:  You develop new bleeding anywhere in the body. Get help right away if:  You are very weak.  You are short of breath.  You have pain in your abdomen or chest.  You are dizzy or feel faint.  You have trouble concentrating.  You have bloody or black, tarry stools.  You vomit repeatedly or you vomit up blood. Summary  Anemia is a condition in which you do not have enough red blood cells or enough of a substance in your red blood cells that carries oxygen (hemoglobin).  Symptoms may occur suddenly or develop slowly.  If your anemia is mild, you may not have symptoms.  This condition is diagnosed with blood tests as well as a medical history and physical exam. Other tests may be needed.  Treatment for this condition depends on the cause of the anemia. This information is not intended to replace advice given to you by  your health care provider. Make sure you discuss any questions you have with your health care provider. Document Released: 04/07/2004 Document Revised: 04/01/2016 Document Reviewed: 04/01/2016 Elsevier Interactive Patient Education  2019 [employer].

## 2018-06-04 NOTE — Progress Notes (Signed)
Subjective:  Patient ID: Danielle Frazier, female    DOB: 1930/10/07  Age: 85 y.o. MRN: [id number]  CC: Hematochezia   HPI Danielle Frazier presents for the complaint of a 3-day history of painless bright red blood per rectum.  She tells me she is passing cups of blood at a time.  She has also developed weakness, dizziness, and lightheadedness. The amount of bleeding has diminished over the last 12 hours.  Outpatient Medications Prior to Visit  Medication Sig Dispense Refill  . allopurinol (ZYLOPRIM) 300 MG tablet TAKE 1/2 TABLET BY MOUTH DAILY 45 tablet 1  . Blood Glucose Monitoring Suppl (BAYER CONTOUR MONITOR) W/DEVICE KIT 1 Act by Does not apply route 2 (two) times daily. 100 kit 0  . BYSTOLIC 5 MG tablet TAKE 1 TABLET BY MOUTH DAILY 90 tablet 1  . cholecalciferol (VITAMIN D) 1000 units tablet Take 2,000 Units by mouth daily.    . clonazePAM (KLONOPIN) 0.5 MG tablet TAKE 1 TABLET BY MOUTH TWICE A DAY 60 tablet 3  . fluticasone (FLONASE) 50 MCG/ACT nasal spray Place 2 sprays into both nostrils daily. 16 g 6  . gabapentin (NEURONTIN) 100 MG capsule Take 2 capsules (200 mg total) by mouth at bedtime. 60 capsule 3  . glucose blood (BAYER CONTOUR TEST) test strip Use BID 100 each 11  . Linaclotide (LINZESS) 145 MCG CAPS capsule Take 1 capsule (145 mcg total) by mouth daily. 35 capsule 0  . lisinopril-hydrochlorothiazide (PRINZIDE,ZESTORETIC) 20-25 MG tablet TAKE 1 TABLET BY MOUTH DAILY. 90 tablet 1  . Multiple Vitamins-Minerals (CENTRUM SILVER PO) Take 1 tablet by mouth daily.      . predniSONE (DELTASONE) 20 MG tablet Take 1 tablet (20 mg total) by mouth daily with breakfast. 5 tablet 0  . ranitidine (ZANTAC) 300 MG tablet Take 1 tablet (300 mg total) by mouth at bedtime. 90 tablet 3   No facility-administered medications prior to visit.     ROS Review of Systems  Constitutional: Positive for fatigue. Negative for appetite change, diaphoresis and unexpected weight change.  HENT: Negative.    Eyes: Negative for visual disturbance.  Respiratory: Negative for cough, chest tightness, shortness of breath and wheezing.   Cardiovascular: Negative for chest pain, palpitations and leg swelling.  Gastrointestinal: Positive for anal bleeding and blood in stool. Negative for abdominal distention, abdominal pain, constipation, diarrhea, nausea, rectal pain and vomiting.  Endocrine: Negative.   Genitourinary: Negative.  Negative for difficulty urinating and hematuria.  Musculoskeletal: Negative.   Skin: Positive for pallor.  Neurological: Positive for dizziness, weakness and light-headedness. Negative for syncope and headaches.  Hematological: Negative for adenopathy. Does not bruise/bleed easily.  Psychiatric/Behavioral: Negative.     Objective:  BP (!) 160/70 (BP Location: Left Arm, Patient Position: Sitting, Cuff Size: Normal)   Pulse (!) 112   Temp 98 F (36.7 C) (Oral)   Ht '5\' 6"'  (1.676 m)   Wt 189 lb (85.7 kg)   SpO2 95%   BMI 30.51 kg/m   BP Readings from Last 3 Encounters:  06/04/18 (!) 160/70  04/04/18 122/82  03/24/18 124/82    Wt Readings from Last 3 Encounters:  06/04/18 189 lb (85.7 kg)  04/04/18 191 lb (86.6 kg)  03/24/18 191 lb (86.6 kg)    Physical Exam Vitals signs reviewed. Exam conducted with a chaperone present.  Constitutional:      General: She is not in acute distress.    Appearance: She is obese. She is not ill-appearing, toxic-appearing or  diaphoretic.  HENT:     Nose: Nose normal.     Mouth/Throat:     Mouth: Mucous membranes are moist.     Pharynx: Oropharynx is clear. No oropharyngeal exudate or posterior oropharyngeal erythema.  Eyes:     General: No scleral icterus.    Conjunctiva/sclera: Conjunctivae normal.  Neck:     Musculoskeletal: Normal range of motion. No neck rigidity.  Cardiovascular:     Rate and Rhythm: Normal rate and regular rhythm.     Heart sounds: No murmur.  Pulmonary:     Effort: Pulmonary effort is normal.      Breath sounds: Normal breath sounds. No stridor. No wheezing, rhonchi or rales.  Abdominal:     General: Abdomen is flat.     Palpations: There is no hepatomegaly, splenomegaly or mass.     Tenderness: There is no abdominal tenderness.  Genitourinary:    Rectum: Guaiac result positive. External hemorrhoid and internal hemorrhoid present. No mass, tenderness or anal fissure. Normal anal tone.     Comments: +++ melena Skin:    General: Skin is warm and dry.     Coloration: Skin is not pale.     Findings: No erythema.  Neurological:     General: No focal deficit present.     Mental Status: She is oriented to person, place, and time. Mental status is at baseline.     Lab Results  Component Value Date   WBC 10.2 06/04/2018   HGB 9.0 (L) 06/04/2018   HCT 27.3 (L) 06/04/2018   PLT 244.0 06/04/2018   GLUCOSE 136 (H) 06/04/2018   CHOL 146 07/05/2017   TRIG 188.0 (H) 07/05/2017   HDL 43.60 07/05/2017   LDLDIRECT 90.0 07/06/2015   LDLCALC 65 07/05/2017   ALT 28 07/06/2015   AST 27 07/06/2015   NA 143 06/04/2018   K 3.9 06/04/2018   CL 105 06/04/2018   CREATININE 3.58 (H) 06/04/2018   BUN 53 (H) 06/04/2018   CO2 26 06/04/2018   TSH 2.96 07/05/2017   HGBA1C 6.1 01/08/2018   MICROALBUR 155.7 (H) 01/08/2018    Dg Inject Diag/thera/inc Needle/cath/plc Epi/lumb/sac W/img  Result Date: 02/06/2018 CLINICAL DATA:  Chronic low back pain. Unspecified back pain. 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 Right L5 and S1 radiculopathy. FLUOROSCOPY TIME:  Radiation Exposure Index (as provided by the fluoroscopic device): 24.0 uGy*m2 Fluoroscopy Time:  13 seconds Number of Acquired Images:  0 PROCEDURE: The procedure, risks, benefits, and alternatives were explained to the patient. Questions regarding the procedure were encouraged and answered. The patient understands and consents to the procedure. LUMBAR EPIDURAL INJECTION: An interlaminar approach was performed on right at L5-S1. The  overlying skin was cleansed and anesthetized. A 20 gauge epidural needle was advanced using loss-of-resistance technique. DIAGNOSTIC EPIDURAL INJECTION: Injection of Isovue-M 200 shows a good epidural pattern with spread above and below the level of needle placement, primarily on the right no vascular opacification is seen. THERAPEUTIC EPIDURAL INJECTION: 120 mg of Depo-Medrol mixed with 3 mL 1% lidocaine were instilled. The procedure was well-tolerated, and the patient was discharged thirty minutes following the injection in good condition. COMPLICATIONS: None IMPRESSION: Technically successful epidural injection on the right L5-S1 # 1 Electronically Signed   By: San Morelle M.D.   On: 02/06/2018 12:09    Assessment & Plan:   Danielle Frazier was seen today for hematochezia.  Diagnoses and all orders for this visit:  BRBPR (bright red blood per rectum)- Her symptoms, exam,  and CBC are consistent with a diverticular bleed.  She is symptomatic but she is not willing to be admitted to the hospital for observation and to consider undergoing a blood transfusion.  She will return in 24 hours for repeat H&H and in the meantime she will let me know if she develops any new or worsening symptoms. -     CBC with Differential/Platelet; Future  Essential hypertension- Her blood pressure is adequately well controlled. -     Basic metabolic panel; Future  Type 2 diabetes mellitus with diabetic nephropathy, without long-term-   Current use of insulin (HCC) - Her blood sugars are adequately well controlled. -     Hemoglobin A1c; Future  Chronic renal insufficiency, stage III (moderate) (Danielle Frazier)- She is prerenal with a slight decrease in her GFR.  I think this is related to the acute blood loss anemia.  See above. -     Basic metabolic panel; Future  Anemia associated with acute blood loss- She is symptomatic with this but is not willing to be admitted for observation and possible transfusion.  She will return in 24  hours for repeat H&H.   I am having Danielle Frazier maintain her Multiple Vitamins-Minerals (CENTRUM SILVER PO), Bayer Contour Monitor, glucose blood, linaclotide, ranitidine, gabapentin, clonazePAM, allopurinol, lisinopril-hydrochlorothiazide, cholecalciferol, fluticasone, predniSONE, and Bystolic.  No orders of the defined types were placed in this encounter.    Follow-up: No follow-ups on file.  [name], MD

## 2018-06-05 ENCOUNTER — Ambulatory Visit (INDEPENDENT_AMBULATORY_CARE_PROVIDER_SITE_OTHER): Payer: Medicare Other | Admitting: Internal Medicine

## 2018-06-05 ENCOUNTER — Other Ambulatory Visit (INDEPENDENT_AMBULATORY_CARE_PROVIDER_SITE_OTHER): Payer: Medicare Other

## 2018-06-05 ENCOUNTER — Encounter: Payer: Self-pay | Admitting: Internal Medicine

## 2018-06-05 VITALS — BP 154/70 | HR 69 | Temp 97.8°F | Resp 16 | Ht 66.0 in | Wt 190.8 lb

## 2018-06-05 DIAGNOSIS — K219 Gastro-esophageal reflux disease without esophagitis: Secondary | ICD-10-CM | POA: Diagnosis not present

## 2018-06-05 DIAGNOSIS — D62 Acute posthemorrhagic anemia: Secondary | ICD-10-CM

## 2018-06-05 DIAGNOSIS — K625 Hemorrhage of anus and rectum: Secondary | ICD-10-CM | POA: Diagnosis not present

## 2018-06-05 LAB — CBC WITH DIFFERENTIAL/PLATELET
Basophils Absolute: 0.1 10*3/uL (ref 0.0–0.1)
Basophils Relative: 0.9 % (ref 0.0–3.0)
EOS ABS: 0.2 10*3/uL (ref 0.0–0.7)
Eosinophils Relative: 2.4 % (ref 0.0–5.0)
HEMATOCRIT: 24.2 % — AB (ref 36.0–46.0)
LYMPHS ABS: 2.1 10*3/uL (ref 0.7–4.0)
Lymphocytes Relative: 24.5 % (ref 12.0–46.0)
MCHC: 33.1 g/dL (ref 30.0–36.0)
MCV: 100.9 fl — ABNORMAL HIGH (ref 78.0–100.0)
MONO ABS: 0.6 10*3/uL (ref 0.1–1.0)
Monocytes Relative: 6.5 % (ref 3.0–12.0)
NEUTROS ABS: 5.7 10*3/uL (ref 1.4–7.7)
Neutrophils Relative %: 65.7 % (ref 43.0–77.0)
Platelets: 216 10*3/uL (ref 150.0–400.0)
RBC: 2.4 Mil/uL — ABNORMAL LOW (ref 3.87–5.11)
RDW: 14.7 % (ref 11.5–15.5)
WBC: 8.7 10*3/uL (ref 4.0–10.5)

## 2018-06-05 LAB — FERRITIN: FERRITIN: 64.3 ng/mL (ref 10.0–291.0)

## 2018-06-05 LAB — IBC PANEL
IRON: 59 ug/dL (ref 42–145)
Saturation Ratios: 18.6 % — ABNORMAL LOW (ref 20.0–50.0)
TRANSFERRIN: 227 mg/dL (ref 212.0–360.0)

## 2018-06-05 LAB — PROTIME-INR
INR: 1.1 ratio — ABNORMAL HIGH (ref 0.8–1.0)
Prothrombin Time: 12.7 s (ref 9.6–13.1)

## 2018-06-05 LAB — APTT: aPTT: 31.5 s (ref 23.4–32.7)

## 2018-06-05 MED ORDER — OMEPRAZOLE 40 MG PO CPDR: 40.0000 mg | DELAYED_RELEASE_CAPSULE | Freq: Every day | ORAL | 1 refills | Status: DC

## 2018-06-05 NOTE — Patient Instructions (Signed)
Anemia  Anemia is a condition in which you do not have enough red blood cells or hemoglobin. Hemoglobin is a substance in red blood cells that carries oxygen. When you do not have enough red blood cells or hemoglobin (are anemic), your body cannot get enough oxygen and your organs may not work properly. As a result, you may feel very tired or have other problems. What are the causes? Common causes of anemia include:  Excessive bleeding. Anemia can be caused by excessive bleeding inside or outside the body, including bleeding from the intestine or from periods in women.  Poor nutrition.  Long-lasting (chronic) kidney, thyroid, and liver disease.  Bone marrow disorders.  Cancer and treatments for cancer.  HIV (human immunodeficiency virus) and AIDS (acquired immunodeficiency syndrome).  Treatments for HIV and AIDS.  Spleen problems.  Blood disorders.  Infections, medicines, and autoimmune disorders that destroy red blood cells. What are the signs or symptoms? Symptoms of this condition include:  Minor weakness.  Dizziness.  Headache.  Feeling heartbeats that are irregular or faster than normal (palpitations).  Shortness of breath, especially with exercise.  Paleness.  Cold sensitivity.  Indigestion.  Nausea.  Difficulty sleeping.  Difficulty concentrating. Symptoms may occur suddenly or develop slowly. If your anemia is mild, you may not have symptoms. How is this diagnosed? This condition is diagnosed based on:  Blood tests.  Your medical history.  A physical exam.  Bone marrow biopsy. Your health care provider may also check your stool (feces) for blood and may do additional testing to look for the cause of your bleeding. You may also have other tests, including:  Imaging tests, such as a CT scan or MRI.  Endoscopy.  Colonoscopy. How is this treated? Treatment for this condition depends on the cause. If you continue to lose a lot of blood, you may  need to be treated at a hospital. Treatment may include:  Taking supplements of iron, vitamin A63, or folic acid.  Taking a hormone medicine (erythropoietin) that can help to stimulate red blood cell growth.  Having a blood transfusion. This may be needed if you lose a lot of blood.  Making changes to your diet.  Having surgery to remove your spleen. Follow these instructions at home:  Take over-the-counter and prescription medicines only as told by your health care provider.  Take supplements only as told by your health care provider.  Follow any diet instructions that you were given.  Keep all follow-up visits as told by your health care provider. This is important. Contact a health care provider if:  You develop new bleeding anywhere in the body. Get help right away if:  You are very weak.  You are short of breath.  You have pain in your abdomen or chest.  You are dizzy or feel faint.  You have trouble concentrating.  You have bloody or black, tarry stools.  You vomit repeatedly or you vomit up blood. Summary  Anemia is a condition in which you do not have enough red blood cells or enough of a substance in your red blood cells that carries oxygen (hemoglobin).  Symptoms may occur suddenly or develop slowly.  If your anemia is mild, you may not have symptoms.  This condition is diagnosed with blood tests as well as a medical history and physical exam. Other tests may be needed.  Treatment for this condition depends on the cause of the anemia. This information is not intended to replace advice given to you by  your health care provider. Make sure you discuss any questions you have with your health care provider. Document Released: 04/07/2004 Document Revised: 04/01/2016 Document Reviewed: 04/01/2016 Elsevier Interactive Patient Education  2019 [employer].

## 2018-06-05 NOTE — Progress Notes (Signed)
Subjective:  Patient ID: Danielle Frazier, female    DOB: Oct 28, 1930  Age: 85 y.o. MRN: [id number]  CC: Anemia   HPI RAIANNA SLIGHT presents for f/up - She continues to pass stools that she thinks are melanic but she has not seen any more bright red blood.  Over the last 24 hours she has started feeling better with an improvement in her dizziness and lightheadedness.  Occasionally has heartburn for which she takes over-the-counter doses of Prilosec.  She denies odynophagia or dysphagia.  The last time she took Aleve was about a week or 2 ago.  Outpatient Medications Prior to Visit  Medication Sig Dispense Refill  . allopurinol (ZYLOPRIM) 300 MG tablet TAKE 1/2 TABLET BY MOUTH DAILY 45 tablet 1  . Blood Glucose Monitoring Suppl (BAYER CONTOUR MONITOR) W/DEVICE KIT 1 Act by Does not apply route 2 (two) times daily. 100 kit 0  . BYSTOLIC 5 MG tablet TAKE 1 TABLET BY MOUTH DAILY 90 tablet 1  . cholecalciferol (VITAMIN D) 1000 units tablet Take 2,000 Units by mouth daily.    . clonazePAM (KLONOPIN) 0.5 MG tablet TAKE 1 TABLET BY MOUTH TWICE A DAY 60 tablet 3  . fluticasone (FLONASE) 50 MCG/ACT nasal spray Place 2 sprays into both nostrils daily. 16 g 6  . gabapentin (NEURONTIN) 100 MG capsule Take 2 capsules (200 mg total) by mouth at bedtime. 60 capsule 3  . glucose blood (BAYER CONTOUR TEST) test strip Use BID 100 each 11  . Linaclotide (LINZESS) 145 MCG CAPS capsule Take 1 capsule (145 mcg total) by mouth daily. 35 capsule 0  . lisinopril-hydrochlorothiazide (PRINZIDE,ZESTORETIC) 20-25 MG tablet TAKE 1 TABLET BY MOUTH DAILY. 90 tablet 1  . Multiple Vitamins-Minerals (CENTRUM SILVER PO) Take 1 tablet by mouth daily.      . predniSONE (DELTASONE) 20 MG tablet Take 1 tablet (20 mg total) by mouth daily with breakfast. 5 tablet 0  . ranitidine (ZANTAC) 300 MG tablet Take 1 tablet (300 mg total) by mouth at bedtime. 90 tablet 3   No facility-administered medications prior to visit.     ROS  Review of Systems  Constitutional: Negative for appetite change, diaphoresis, fatigue and unexpected weight change.  HENT: Negative.  Negative for trouble swallowing.   Eyes: Negative for visual disturbance.  Respiratory: Negative for cough, chest tightness, shortness of breath and wheezing.   Cardiovascular: Negative for chest pain, palpitations and leg swelling.  Gastrointestinal: Negative for abdominal pain, anal bleeding, blood in stool, constipation, diarrhea, nausea and vomiting.  Endocrine: Negative.   Genitourinary: Negative.  Negative for difficulty urinating.  Musculoskeletal: Negative.   Skin: Negative.   Neurological: Positive for light-headedness. Negative for dizziness and weakness.  Hematological: Negative for adenopathy. Does not bruise/bleed easily.  Psychiatric/Behavioral: Negative.     Objective:  BP (!) 154/70 (BP Location: Left Arm, Patient Position: Sitting, Cuff Size: Normal)   Pulse 69   Temp 97.8 F (36.6 C) (Oral)   Resp 16   Ht '5\' 6"'  (1.676 m)   Wt 190 lb 12 oz (86.5 kg)   SpO2 99%   BMI 30.79 kg/m   BP Readings from Last 3 Encounters:  06/05/18 (!) 154/70  06/04/18 (!) 160/70  04/04/18 122/82    Wt Readings from Last 3 Encounters:  06/05/18 190 lb 12 oz (86.5 kg)  06/04/18 189 lb (85.7 kg)  04/04/18 191 lb (86.6 kg)    Physical Exam Vitals signs reviewed.  Constitutional:      Appearance:  She is obese. She is not ill-appearing or diaphoretic.  HENT:     Nose: Nose normal.     Mouth/Throat:     Mouth: Mucous membranes are moist.     Pharynx: Oropharynx is clear. No oropharyngeal exudate or posterior oropharyngeal erythema.  Eyes:     General: No scleral icterus.    Conjunctiva/sclera: Conjunctivae normal.  Neck:     Musculoskeletal: Normal range of motion and neck supple.  Cardiovascular:     Rate and Rhythm: Normal rate and regular rhythm.     Heart sounds: No murmur. No gallop.   Pulmonary:     Effort: Pulmonary effort is normal.      Breath sounds: No stridor. No wheezing, rhonchi or rales.  Abdominal:     General: Abdomen is flat. Bowel sounds are normal.     Palpations: There is no splenomegaly or mass.     Tenderness: There is no abdominal tenderness. There is no guarding.  Musculoskeletal: Normal range of motion.        General: No swelling.     Right lower leg: No edema.     Left lower leg: No edema.  Skin:    General: Skin is warm and dry.     Coloration: Skin is not pale.  Neurological:     General: No focal deficit present.     Mental Status: She is oriented to person, place, and time. Mental status is at baseline.     Lab Results  Component Value Date   WBC 8.7 06/05/2018   HGB 8.0 Repeated and verified X2. (LL) 06/05/2018   HCT 24.2 (L) 06/05/2018   PLT 216.0 06/05/2018   GLUCOSE 136 (H) 06/04/2018   CHOL 146 07/05/2017   TRIG 188.0 (H) 07/05/2017   HDL 43.60 07/05/2017   LDLDIRECT 90.0 07/06/2015   LDLCALC 65 07/05/2017   ALT 28 07/06/2015   AST 27 07/06/2015   NA 143 06/04/2018   K 3.9 06/04/2018   CL 105 06/04/2018   CREATININE 3.58 (H) 06/04/2018   BUN 53 (H) 06/04/2018   CO2 26 06/04/2018   TSH 2.96 07/05/2017   INR 1.1 (H) 06/05/2018   HGBA1C 6.0 06/04/2018   MICROALBUR 155.7 (H) 01/08/2018    Dg Inject Diag/thera/inc Needle/cath/plc Epi/lumb/sac W/img  Result Date: 02/06/2018 CLINICAL DATA:  Chronic low back pain. Unspecified back pain. 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 Right L5 and S1 radiculopathy. FLUOROSCOPY TIME:  Radiation Exposure Index (as provided by the fluoroscopic device): 24.0 uGy*m2 Fluoroscopy Time:  13 seconds Number of Acquired Images:  0 PROCEDURE: The procedure, risks, benefits, and alternatives were explained to the patient. Questions regarding the procedure were encouraged and answered. The patient understands and consents to the procedure. LUMBAR EPIDURAL INJECTION: An interlaminar approach was performed on right at L5-S1. The overlying skin was  cleansed and anesthetized. A 20 gauge epidural needle was advanced using loss-of-resistance technique. DIAGNOSTIC EPIDURAL INJECTION: Injection of Isovue-M 200 shows a good epidural pattern with spread above and below the level of needle placement, primarily on the right no vascular opacification is seen. THERAPEUTIC EPIDURAL INJECTION: 120 mg of Depo-Medrol mixed with 3 mL 1% lidocaine were instilled. The procedure was well-tolerated, and the patient was discharged thirty minutes following the injection in good condition. COMPLICATIONS: None IMPRESSION: Technically successful epidural injection on the right L5-S1 # 1 Electronically Signed   By: San Morelle M.D.   On: 02/06/2018 12:09    Assessment & Plan:   Herman was  seen today for anemia.  Diagnoses and all orders for this visit:  Anemia associated with acute blood loss- Her hemoglobin and hematocrit have dropped compared to yesterday.  She continues to reiterate that she does not want to be admitted to the hospital for observation and possible transfusion.  Her iron level and coags are normal.  I think she has a lower GI bleed that sounds like it is resolving.  She will come back in 2 days for a repeat hemoglobin and hematocrit. -     CBC with Differential/Platelet; Future -     IBC panel; Future -     Ferritin; Future -     Protime-INR; Future -     APTT; Future  BRBPR (bright red blood per rectum)- She is not passing bright red blood anymore but she has noticed melena.  It sounds like this has improved some.  Gastroesophageal reflux disease without esophagitis- I have asked her to upgrade to a prescription strength PPI in the event that her bleeding is from the upper GI tract. -     omeprazole (PRILOSEC) 40 MG capsule; Take 1 capsule (40 mg total) by mouth daily.   I have discontinued Gladyce C. Nylund's ranitidine and predniSONE. I am also having her start on omeprazole. Additionally, I am having her maintain her Multiple  Vitamins-Minerals (CENTRUM SILVER PO), Bayer Contour Monitor, glucose blood, linaclotide, gabapentin, clonazePAM, allopurinol, lisinopril-hydrochlorothiazide, cholecalciferol, fluticasone, and Bystolic.  Meds ordered this encounter  Medications  . omeprazole (PRILOSEC) 40 MG capsule    Sig: Take 1 capsule (40 mg total) by mouth daily.    Dispense:  90 capsule    Refill:  1     Follow-up: No follow-ups on file.  [name], MD

## 2018-06-07 ENCOUNTER — Other Ambulatory Visit: Payer: Self-pay

## 2018-06-07 ENCOUNTER — Inpatient Hospital Stay (HOSPITAL_COMMUNITY): Payer: Medicare Other

## 2018-06-07 ENCOUNTER — Ambulatory Visit (INDEPENDENT_AMBULATORY_CARE_PROVIDER_SITE_OTHER): Payer: Medicare Other | Admitting: Internal Medicine

## 2018-06-07 ENCOUNTER — Encounter (HOSPITAL_COMMUNITY): Payer: Self-pay | Admitting: Emergency Medicine

## 2018-06-07 ENCOUNTER — Encounter: Payer: Self-pay | Admitting: Internal Medicine

## 2018-06-07 ENCOUNTER — Inpatient Hospital Stay (HOSPITAL_COMMUNITY)
Admission: EM | Admit: 2018-06-07 | Discharge: 2018-06-08 | DRG: 378 | Disposition: A | Discharge status: Home / Self Care | Payer: Medicare Other | Attending: Internal Medicine | Admitting: Internal Medicine

## 2018-06-07 ENCOUNTER — Other Ambulatory Visit: Payer: Self-pay | Admitting: Internal Medicine

## 2018-06-07 ENCOUNTER — Other Ambulatory Visit (INDEPENDENT_AMBULATORY_CARE_PROVIDER_SITE_OTHER): Payer: Medicare Other

## 2018-06-07 VITALS — BP 128/58 | HR 96 | Temp 98.1°F | Resp 16 | Ht 66.0 in | Wt 187.0 lb

## 2018-06-07 DIAGNOSIS — D62 Acute posthemorrhagic anemia: Secondary | ICD-10-CM

## 2018-06-07 DIAGNOSIS — N281 Cyst of kidney, acquired: Secondary | ICD-10-CM | POA: Diagnosis not present

## 2018-06-07 DIAGNOSIS — M25551 Pain in right hip: Secondary | ICD-10-CM | POA: Diagnosis not present

## 2018-06-07 DIAGNOSIS — R32 Unspecified urinary incontinence: Secondary | ICD-10-CM | POA: Diagnosis present

## 2018-06-07 DIAGNOSIS — I4891 Unspecified atrial fibrillation: Secondary | ICD-10-CM | POA: Diagnosis not present

## 2018-06-07 DIAGNOSIS — I120 Hypertensive chronic kidney disease with stage 5 chronic kidney disease or end stage renal disease: Secondary | ICD-10-CM | POA: Diagnosis not present

## 2018-06-07 DIAGNOSIS — Z79899 Other long term (current) drug therapy: Secondary | ICD-10-CM | POA: Diagnosis not present

## 2018-06-07 DIAGNOSIS — M1990 Unspecified osteoarthritis, unspecified site: Secondary | ICD-10-CM | POA: Diagnosis present

## 2018-06-07 DIAGNOSIS — I10 Essential (primary) hypertension: Secondary | ICD-10-CM | POA: Diagnosis not present

## 2018-06-07 DIAGNOSIS — E785 Hyperlipidemia, unspecified: Secondary | ICD-10-CM | POA: Diagnosis not present

## 2018-06-07 DIAGNOSIS — Z8249 Family history of ischemic heart disease and other diseases of the circulatory system: Secondary | ICD-10-CM

## 2018-06-07 DIAGNOSIS — Z888 Allergy status to other drugs, medicaments and biological substances status: Secondary | ICD-10-CM

## 2018-06-07 DIAGNOSIS — K5790 Diverticulosis of intestine, part unspecified, without perforation or abscess without bleeding: Secondary | ICD-10-CM | POA: Diagnosis not present

## 2018-06-07 DIAGNOSIS — Z7951 Long term (current) use of inhaled steroids: Secondary | ICD-10-CM | POA: Diagnosis not present

## 2018-06-07 DIAGNOSIS — E1122 Type 2 diabetes mellitus with diabetic chronic kidney disease: Secondary | ICD-10-CM | POA: Diagnosis present

## 2018-06-07 DIAGNOSIS — N185 Chronic kidney disease, stage 5: Secondary | ICD-10-CM | POA: Diagnosis not present

## 2018-06-07 DIAGNOSIS — K219 Gastro-esophageal reflux disease without esophagitis: Secondary | ICD-10-CM | POA: Diagnosis not present

## 2018-06-07 DIAGNOSIS — E1121 Type 2 diabetes mellitus with diabetic nephropathy: Secondary | ICD-10-CM | POA: Diagnosis not present

## 2018-06-07 DIAGNOSIS — K222 Esophageal obstruction: Secondary | ICD-10-CM | POA: Diagnosis present

## 2018-06-07 DIAGNOSIS — R42 Dizziness and giddiness: Secondary | ICD-10-CM | POA: Diagnosis not present

## 2018-06-07 DIAGNOSIS — K921 Melena: Secondary | ICD-10-CM | POA: Diagnosis not present

## 2018-06-07 DIAGNOSIS — Z66 Do not resuscitate: Secondary | ICD-10-CM | POA: Diagnosis present

## 2018-06-07 DIAGNOSIS — K449 Diaphragmatic hernia without obstruction or gangrene: Secondary | ICD-10-CM | POA: Diagnosis present

## 2018-06-07 DIAGNOSIS — I4819 Other persistent atrial fibrillation: Secondary | ICD-10-CM | POA: Diagnosis present

## 2018-06-07 DIAGNOSIS — Z833 Family history of diabetes mellitus: Secondary | ICD-10-CM

## 2018-06-07 DIAGNOSIS — K922 Gastrointestinal hemorrhage, unspecified: Secondary | ICD-10-CM | POA: Diagnosis present

## 2018-06-07 DIAGNOSIS — E1129 Type 2 diabetes mellitus with other diabetic kidney complication: Secondary | ICD-10-CM | POA: Diagnosis present

## 2018-06-07 DIAGNOSIS — M109 Gout, unspecified: Secondary | ICD-10-CM | POA: Diagnosis not present

## 2018-06-07 HISTORY — DX: Gastrointestinal hemorrhage, unspecified: K92.2

## 2018-06-07 HISTORY — DX: Other persistent atrial fibrillation: I48.19

## 2018-06-07 LAB — CBC WITH DIFFERENTIAL/PLATELET
Abs Immature Granulocytes: 0.03 10*3/uL (ref 0.00–0.07)
BASOS ABS: 0.1 10*3/uL (ref 0.0–0.1)
BASOS PCT: 0.8 % (ref 0.0–3.0)
BASOS PCT: 1 %
Basophils Absolute: 0 10*3/uL (ref 0.0–0.1)
EOS PCT: 1 %
Eosinophils Absolute: 0.1 10*3/uL (ref 0.0–0.7)
Eosinophils Absolute: 0.1 10*3/uL (ref 0.0–0.5)
Eosinophils Relative: 1.5 % (ref 0.0–5.0)
HCT: 22.9 % — ABNORMAL LOW (ref 36.0–46.0)
Hemoglobin: 6.9 g/dL — CL (ref 12.0–15.0)
Hemoglobin: 6.6 g/dL — CL (ref 12.0–15.0)
Immature Granulocytes: 0 %
Lymphocytes Relative: 19.5 % (ref 12.0–46.0)
Lymphocytes Relative: 16 %
Lymphs Abs: 1.5 10*3/uL (ref 0.7–4.0)
Lymphs Abs: 1.2 10*3/uL (ref 0.7–4.0)
MCH: 31.9 pg (ref 26.0–34.0)
MCHC: 32.8 g/dL (ref 30.0–36.0)
MCHC: 28.8 g/dL — ABNORMAL LOW (ref 30.0–36.0)
MCV: 100.5 fl — AB (ref 78.0–100.0)
MCV: 110.6 fL — AB (ref 80.0–100.0)
MONO ABS: 0.5 10*3/uL (ref 0.1–1.0)
MONOS PCT: 6.2 % (ref 3.0–12.0)
Monocytes Absolute: 0.5 10*3/uL (ref 0.1–1.0)
Monocytes Relative: 7 %
NEUTROS ABS: 5.4 10*3/uL (ref 1.4–7.7)
NEUTROS ABS: 5.3 10*3/uL (ref 1.7–7.7)
Neutrophils Relative %: 72 % (ref 43.0–77.0)
Neutrophils Relative %: 75 %
PLATELETS: 256 10*3/uL (ref 150.0–400.0)
PLATELETS: 270 10*3/uL (ref 150–400)
RBC: 2.05 Mil/uL — ABNORMAL LOW (ref 3.87–5.11)
RBC: 2.07 MIL/uL — AB (ref 3.87–5.11)
RDW: 14.9 % (ref 11.5–15.5)
RDW: 14.2 % (ref 11.5–15.5)
WBC: 7.5 10*3/uL (ref 4.0–10.5)
WBC: 7.2 10*3/uL (ref 4.0–10.5)
nRBC: 0 % (ref 0.0–0.2)

## 2018-06-07 LAB — RETICULOCYTES
ABS Retic: 75620 cells/uL (ref 20000–8000)
Retic Ct Pct: 3.8 %

## 2018-06-07 LAB — PREPARE RBC (CROSSMATCH)

## 2018-06-07 LAB — COMPREHENSIVE METABOLIC PANEL
ALT: 13 U/L (ref 0–44)
AST: 20 U/L (ref 15–41)
Albumin: 3.3 g/dL — ABNORMAL LOW (ref 3.5–5.0)
Alkaline Phosphatase: 123 U/L (ref 38–126)
Anion gap: 13 (ref 5–15)
BILIRUBIN TOTAL: 0.4 mg/dL (ref 0.3–1.2)
BUN: 51 mg/dL — AB (ref 8–23)
CO2: 25 mmol/L (ref 22–32)
CREATININE: 3.48 mg/dL — AB (ref 0.44–1.00)
Calcium: 9.7 mg/dL (ref 8.9–10.3)
Chloride: 104 mmol/L (ref 98–111)
GFR calc Af Amer: 13 mL/min — ABNORMAL LOW (ref >60)
GFR, EST NON AFRICAN AMERICAN: 11 mL/min — AB (ref >60)
Glucose, Bld: 142 mg/dL — ABNORMAL HIGH (ref 70–99)
Potassium: 3.4 mmol/L — ABNORMAL LOW (ref 3.5–5.1)
Sodium: 142 mmol/L (ref 135–145)
TOTAL PROTEIN: 6.2 g/dL — AB (ref 6.5–8.1)

## 2018-06-07 LAB — POC OCCULT BLOOD, ED: Fecal Occult Bld: POSITIVE — AB

## 2018-06-07 MED ORDER — IOHEXOL 300 MG/ML  SOLN: 30.0000 mL | Freq: Once | INTRAMUSCULAR | Status: DC | PRN

## 2018-06-07 MED ORDER — ALLOPURINOL 300 MG PO TABS
150.0000 mg | ORAL_TABLET | Freq: Every day | ORAL | Status: DC
  Administered 2018-06-08: 150 mg via ORAL
  Filled 2018-06-07: qty 1

## 2018-06-07 MED ORDER — SODIUM CHLORIDE 0.9 % IV SOLN
26.0000 ug | Freq: Once | INTRAVENOUS | Status: AC
  Administered 2018-06-07: 26 ug via INTRAVENOUS
  Filled 2018-06-07: qty 6.5

## 2018-06-07 MED ORDER — ONDANSETRON HCL 4 MG/2ML IJ SOLN: 4.0000 mg | Freq: Four times a day (QID) | INTRAMUSCULAR | Status: DC | PRN

## 2018-06-07 MED ORDER — LACTATED RINGERS IV SOLN
INTRAVENOUS | Status: DC
  Administered 2018-06-08: 01:00:00 via INTRAVENOUS

## 2018-06-07 MED ORDER — SODIUM CHLORIDE 0.9 % IV SOLN
8.0000 mg/h | INTRAVENOUS | Status: DC
  Administered 2018-06-07 – 2018-06-08 (×2): 8 mg/h via INTRAVENOUS
  Filled 2018-06-07 (×3): qty 80

## 2018-06-07 MED ORDER — SODIUM CHLORIDE 0.9% FLUSH
3.0000 mL | Freq: Two times a day (BID) | INTRAVENOUS | Status: DC
  Administered 2018-06-08: 3 mL via INTRAVENOUS

## 2018-06-07 MED ORDER — SODIUM CHLORIDE 0.9 % IV SOLN
10.0000 mL/h | Freq: Once | INTRAVENOUS | Status: AC
  Administered 2018-06-07: 10 mL/h via INTRAVENOUS

## 2018-06-07 MED ORDER — ACETAMINOPHEN 650 MG RE SUPP: 650.0000 mg | Freq: Four times a day (QID) | RECTAL | Status: DC | PRN

## 2018-06-07 MED ORDER — LIP MEDEX EX OINT
1.0000 "application " | TOPICAL_OINTMENT | CUTANEOUS | Status: DC | PRN
  Filled 2018-06-07: qty 7

## 2018-06-07 MED ORDER — FLUTICASONE PROPIONATE 50 MCG/ACT NA SUSP
2.0000 | Freq: Every day | NASAL | Status: DC
  Administered 2018-06-08: 2 via NASAL
  Filled 2018-06-07: qty 16

## 2018-06-07 MED ORDER — ONDANSETRON HCL 4 MG PO TABS: 4.0000 mg | ORAL_TABLET | Freq: Four times a day (QID) | ORAL | Status: DC | PRN

## 2018-06-07 MED ORDER — GABAPENTIN 100 MG PO CAPS: 200.0000 mg | ORAL_CAPSULE | Freq: Every day | ORAL | Status: DC

## 2018-06-07 MED ORDER — CLONAZEPAM 0.5 MG PO TABS: 0.5000 mg | ORAL_TABLET | Freq: Two times a day (BID) | ORAL | Status: DC

## 2018-06-07 MED ORDER — NEBIVOLOL HCL 5 MG PO TABS
5.0000 mg | ORAL_TABLET | Freq: Every day | ORAL | Status: DC
  Administered 2018-06-08: 5 mg via ORAL
  Filled 2018-06-07: qty 1

## 2018-06-07 MED ORDER — PANTOPRAZOLE SODIUM 40 MG IV SOLR
40.0000 mg | Freq: Once | INTRAVENOUS | Status: AC
  Administered 2018-06-07: 40 mg via INTRAVENOUS
  Filled 2018-06-07: qty 40

## 2018-06-07 MED ORDER — ACETAMINOPHEN 325 MG PO TABS: 650.0000 mg | ORAL_TABLET | Freq: Four times a day (QID) | ORAL | Status: DC | PRN

## 2018-06-07 NOTE — ED Notes (Signed)
Hgb reported 6.8 MD made aware

## 2018-06-07 NOTE — Patient Instructions (Signed)
Anemia  Anemia is a condition in which you do not have enough red blood cells or hemoglobin. Hemoglobin is a substance in red blood cells that carries oxygen. When you do not have enough red blood cells or hemoglobin (are anemic), your body cannot get enough oxygen and your organs may not work properly. As a result, you may feel very tired or have other problems. What are the causes? Common causes of anemia include:  Excessive bleeding. Anemia can be caused by excessive bleeding inside or outside the body, including bleeding from the intestine or from periods in women.  Poor nutrition.  Long-lasting (chronic) kidney, thyroid, and liver disease.  Bone marrow disorders.  Cancer and treatments for cancer.  HIV (human immunodeficiency virus) and AIDS (acquired immunodeficiency syndrome).  Treatments for HIV and AIDS.  Spleen problems.  Blood disorders.  Infections, medicines, and autoimmune disorders that destroy red blood cells. What are the signs or symptoms? Symptoms of this condition include:  Minor weakness.  Dizziness.  Headache.  Feeling heartbeats that are irregular or faster than normal (palpitations).  Shortness of breath, especially with exercise.  Paleness.  Cold sensitivity.  Indigestion.  Nausea.  Difficulty sleeping.  Difficulty concentrating. Symptoms may occur suddenly or develop slowly. If your anemia is mild, you may not have symptoms. How is this diagnosed? This condition is diagnosed based on:  Blood tests.  Your medical history.  A physical exam.  Bone marrow biopsy. Your health care provider may also check your stool (feces) for blood and may do additional testing to look for the cause of your bleeding. You may also have other tests, including:  Imaging tests, such as a CT scan or MRI.  Endoscopy.  Colonoscopy. How is this treated? Treatment for this condition depends on the cause. If you continue to lose a lot of blood, you may  need to be treated at a hospital. Treatment may include:  Taking supplements of iron, vitamin S31, or folic acid.  Taking a hormone medicine (erythropoietin) that can help to stimulate red blood cell growth.  Having a blood transfusion. This may be needed if you lose a lot of blood.  Making changes to your diet.  Having surgery to remove your spleen. Follow these instructions at home:  Take over-the-counter and prescription medicines only as told by your health care provider.  Take supplements only as told by your health care provider.  Follow any diet instructions that you were given.  Keep all follow-up visits as told by your health care provider. This is important. Contact a health care provider if:  You develop new bleeding anywhere in the body. Get help right away if:  You are very weak.  You are short of breath.  You have pain in your abdomen or chest.  You are dizzy or feel faint.  You have trouble concentrating.  You have bloody or black, tarry stools.  You vomit repeatedly or you vomit up blood. Summary  Anemia is a condition in which you do not have enough red blood cells or enough of a substance in your red blood cells that carries oxygen (hemoglobin).  Symptoms may occur suddenly or develop slowly.  If your anemia is mild, you may not have symptoms.  This condition is diagnosed with blood tests as well as a medical history and physical exam. Other tests may be needed.  Treatment for this condition depends on the cause of the anemia. This information is not intended to replace advice given to you by  your health care provider. Make sure you discuss any questions you have with your health care provider. Document Released: 04/07/2004 Document Revised: 04/01/2016 Document Reviewed: 04/01/2016 Elsevier Interactive Patient Education  2019 [employer].

## 2018-06-07 NOTE — ED Notes (Signed)
ED Provider at bedside. 

## 2018-06-07 NOTE — Progress Notes (Signed)
Subjective:  Patient ID: Danielle Frazier, female    DOB: Jul 25, 1930  Age: 85 y.o. MRN: [id number]  CC: Anemia   HPI Danielle Frazier presents for f/up - She continues to complain of melena and since I last saw her she has developed diffuse weakness, dizziness, lightheadedness, and shortness of breath.  Outpatient Medications Prior to Visit  Medication Sig Dispense Refill  . allopurinol (ZYLOPRIM) 300 MG tablet TAKE 1/2 TABLET BY MOUTH DAILY 45 tablet 1  . Blood Glucose Monitoring Suppl (BAYER CONTOUR MONITOR) W/DEVICE KIT 1 Act by Does not apply route 2 (two) times daily. 100 kit 0  . BYSTOLIC 5 MG tablet TAKE 1 TABLET BY MOUTH DAILY 90 tablet 1  . cholecalciferol (VITAMIN D) 1000 units tablet Take 2,000 Units by mouth daily.    . clonazePAM (KLONOPIN) 0.5 MG tablet TAKE 1 TABLET BY MOUTH TWICE A DAY 60 tablet 3  . fluticasone (FLONASE) 50 MCG/ACT nasal spray Place 2 sprays into both nostrils daily. 16 g 6  . gabapentin (NEURONTIN) 100 MG capsule Take 2 capsules (200 mg total) by mouth at bedtime. 60 capsule 3  . glucose blood (BAYER CONTOUR TEST) test strip Use BID 100 each 11  . Linaclotide (LINZESS) 145 MCG CAPS capsule Take 1 capsule (145 mcg total) by mouth daily. 35 capsule 0  . lisinopril-hydrochlorothiazide (PRINZIDE,ZESTORETIC) 20-25 MG tablet TAKE 1 TABLET BY MOUTH DAILY. 90 tablet 1  . Multiple Vitamins-Minerals (CENTRUM SILVER PO) Take 1 tablet by mouth daily.      Marland Kitchen omeprazole (PRILOSEC) 40 MG capsule Take 1 capsule (40 mg total) by mouth daily. 90 capsule 1   No facility-administered medications prior to visit.     ROS Review of Systems  Constitutional: Positive for fatigue. Negative for diaphoresis and unexpected weight change.  HENT: Negative.   Respiratory: Positive for shortness of breath. Negative for chest tightness and wheezing.   Cardiovascular: Negative for chest pain.  Gastrointestinal: Positive for blood in stool. Negative for abdominal pain, constipation,  diarrhea, nausea and vomiting.  Endocrine: Negative.   Genitourinary: Negative.  Negative for difficulty urinating.  Musculoskeletal: Negative.   Skin: Positive for pallor.  Neurological: Positive for dizziness, weakness and light-headedness. Negative for numbness.  Hematological: Negative for adenopathy. Does not bruise/bleed easily.  Psychiatric/Behavioral: Negative.     Objective:  BP (!) 128/58 (BP Location: Left Arm, Patient Position: Sitting, Cuff Size: Normal)   Pulse 96   Temp 98.1 F (36.7 C) (Oral)   Resp 16   Ht '5\' 6"'  (1.676 m)   Wt 187 lb (84.8 kg)   SpO2 96%   BMI 30.18 kg/m   BP Readings from Last 3 Encounters:  06/07/18 (!) 128/58  06/05/18 (!) 154/70  06/04/18 (!) 160/70    Wt Readings from Last 3 Encounters:  06/07/18 187 lb (84.8 kg)  06/05/18 190 lb 12 oz (86.5 kg)  06/04/18 189 lb (85.7 kg)    Physical Exam Vitals signs reviewed.  Constitutional:      Appearance: She is not ill-appearing or diaphoretic.  HENT:     Mouth/Throat:     Mouth: Mucous membranes are pale.  Eyes:     General: No scleral icterus. Neck:     Musculoskeletal: Normal range of motion. No muscular tenderness.  Cardiovascular:     Rate and Rhythm: Regular rhythm. Tachycardia present.     Heart sounds: No murmur.  Pulmonary:     Effort: Pulmonary effort is normal.     Breath sounds:  No stridor. No wheezing, rhonchi or rales.  Abdominal:     General: Abdomen is flat. Bowel sounds are normal.     Palpations: There is no mass.     Tenderness: There is no abdominal tenderness. There is no guarding.  Musculoskeletal: Normal range of motion.        General: No swelling.     Right lower leg: No edema.     Left lower leg: No edema.  Lymphadenopathy:     Cervical: No cervical adenopathy.  Skin:    General: Skin is warm and dry.     Coloration: Skin is pale.  Neurological:     General: No focal deficit present.     Lab Results  Component Value Date   WBC 7.5 06/07/2018    HGB 6.9 Repeated and verified X2. (LL) 06/07/2018   HCT 20.9 Repeated and verified X2. (LL) 06/07/2018   PLT 256.0 06/07/2018   GLUCOSE 136 (H) 06/04/2018   CHOL 146 07/05/2017   TRIG 188.0 (H) 07/05/2017   HDL 43.60 07/05/2017   LDLDIRECT 90.0 07/06/2015   LDLCALC 65 07/05/2017   ALT 28 07/06/2015   AST 27 07/06/2015   NA 143 06/04/2018   K 3.9 06/04/2018   CL 105 06/04/2018   CREATININE 3.58 (H) 06/04/2018   BUN 53 (H) 06/04/2018   CO2 26 06/04/2018   TSH 2.96 07/05/2017   INR 1.1 (H) 06/05/2018   HGBA1C 6.0 06/04/2018   MICROALBUR 155.7 (H) 01/08/2018    Assessment & Plan:   Danielle Frazier was seen today for anemia.  Diagnoses and all orders for this visit:  Anemia associated with acute blood loss- She continues to pass melena.  Her hemoglobin and hematocrit continue to decline.  She is symptomatic and her blood pressure has dropped and her pulse is up.  I have asked her to go to the ED to consider a transfusion and for a work-up of GI sources of blood loss. -     CBC with Differential/Platelet; Future -     Reticulocytes; Future   I am having Danielle Frazier maintain her Multiple Vitamins-Minerals (CENTRUM SILVER PO), Bayer Contour Monitor, glucose blood, linaclotide, gabapentin, clonazePAM, allopurinol, lisinopril-hydrochlorothiazide, cholecalciferol, fluticasone, Bystolic, and omeprazole.  No orders of the defined types were placed in this encounter.    Follow-up: No follow-ups on file.  Danielle Calico, MD

## 2018-06-07 NOTE — Consult Note (Addendum)
Referring Provider: Dr. Karmen Bongo  Primary Care Physician:  [name], MD Primary Gastroenterologist:  Dr. Scarlette Shorts   Reason for Consultation:  GI  Bleed, melena  HPI: Danielle Frazier is a 85 y.o. female with a history of DM II, gout, HTN, hyperlipidemia and renal insufficiency. She developed watery bloody diarrhea on Saturday 06/02/2018. She reported passing "gushing" watery diarrhea with bright red blood, second episode of diarrhea blood was darker and the third episode of diarrhea the blood was dark red, not black. No abdominal pain, nausea or vomiting. She reported having the same bloody diarrhea on Sunday. She developed dizziness and lightheadedness. She was seen by her PCP, Dr. [name], on Monday 06/04/2018. Hg 10.2. (base line Hg 13.3 5 months ago). Repeat labs 3/24 Hg 8. She declined to go to the ED for admission and PRBC transfusion. Repeat labs 3/25 Hg dropped to 8.7. She was seen by Dr. Ronnald Ramp this morning. She reported passing a black soft stool this morning with continued weakness, dizziness, feeling light headed and SOB. She last took Aleve 2 tabs 2 weeks ago for right hip pain. She does not take ASA. No other NSAID use. History of GERD. She takes Prilosec 16m QD. Possibly had an EGD in the past. Never had GIB or ulcers. No chest pain or palpitations. She was sent to the ED for further evaluation.   ED Course: WBC 7.5. Hg 6.9. HCT 20.9. MCV 100.5. PLT 256. Iron 59. Ferritin 64.3.  Repeat Hg 6.6. HCT 22.9. Na 142. K 3.4. BUN 51 (base line BUN 44-38). Cr. 3.48 (baseline Cr 3.13- 2.79). Glu 142. Alk phos 123. Albumin 3.3. AST 20. ALT 13. T. Bili 0.4.  1 unit of PRBCs ordered.  GI History:  07/25/2002 Colonoscopy by Dr. PHenrene Pastor showed sigmoid diverticulosis    Past Medical History:  Diagnosis Date  . Diabetes mellitus    type II  . Gout   . Hyperlipidemia   . Hypertension   . LBP (low back pain)    foraminal narrowing at L4-5 and disc bulge at L5- S1 on MRI 6-08  .  Renal insufficiency   . Urinary incontinence     History reviewed. No pertinent surgical history.  Prior to Admission medications   Medication Sig Start Date End Date Taking? Authorizing Provider  allopurinol (ZYLOPRIM) 300 MG tablet TAKE 1/2 TABLET BY MOUTH DAILY Patient taking differently: Take 150 mg by mouth daily.  11/29/17   JJanith Lima MD  Blood Glucose Monitoring Suppl (BAYER CONTOUR MONITOR) W/DEVICE KIT 1 Act by Does not apply route 2 (two) times daily. 05/06/11   JJanith Lima MD  BYSTOLIC 5 MG tablet TAKE 1 TABLET BY MOUTH DAILY Patient taking differently: Take 5 mg by mouth daily.  05/07/18   JJanith Lima MD  cholecalciferol (VITAMIN D) 1000 units tablet Take 2,000 Units by mouth daily.    [provider]  clonazePAM (KLONOPIN) 0.5 MG tablet TAKE 1 TABLET BY MOUTH TWICE A DAY Patient taking differently: Take 0.5 mg by mouth 2 (two) times daily.  10/24/17   JJanith Lima MD  fluticasone (FLONASE) 50 MCG/ACT nasal spray Place 2 sprays into both nostrils daily. 04/04/18   MMarrian Salvage FNP  gabapentin (NEURONTIN) 100 MG capsule Take 2 capsules (200 mg total) by mouth at bedtime. 10/16/17   SLyndal Pulley DO  glucose blood (BAYER CONTOUR TEST) test strip Use BID 05/06/11   JJanith Lima MD  Linaclotide (Rolan Lipa  145 MCG CAPS capsule Take 1 capsule (145 mcg total) by mouth daily. 04/24/13   [name], MD  lisinopril-hydrochlorothiazide (PRINZIDE,ZESTORETIC) 20-25 MG tablet TAKE 1 TABLET BY MOUTH DAILY. Patient taking differently: Take 1 tablet by mouth daily.  11/29/17   [name], MD  Multiple Vitamins-Minerals (CENTRUM SILVER PO) Take 1 tablet by mouth daily.      [provider]  omeprazole (PRILOSEC) 40 MG capsule Take 1 capsule (40 mg total) by mouth daily. 06/05/18   [name], MD    Current Facility-Administered Medications  Medication Dose Route Frequency Provider Last Rate Last Dose  . 0.9 %  sodium chloride infusion   10 mL/hr Intravenous Once Lennice Sites, DO       Current Outpatient Medications  Medication Sig Dispense Refill  . allopurinol (ZYLOPRIM) 300 MG tablet TAKE 1/2 TABLET BY MOUTH DAILY (Patient taking differently: Take 150 mg by mouth daily. ) 45 tablet 1  . Blood Glucose Monitoring Suppl (BAYER CONTOUR MONITOR) W/DEVICE KIT 1 Act by Does not apply route 2 (two) times daily. 100 kit 0  . BYSTOLIC 5 MG tablet TAKE 1 TABLET BY MOUTH DAILY (Patient taking differently: Take 5 mg by mouth daily. ) 90 tablet 1  . cholecalciferol (VITAMIN D) 1000 units tablet Take 2,000 Units by mouth daily.    . clonazePAM (KLONOPIN) 0.5 MG tablet TAKE 1 TABLET BY MOUTH TWICE A DAY (Patient taking differently: Take 0.5 mg by mouth 2 (two) times daily. ) 60 tablet 3  . fluticasone (FLONASE) 50 MCG/ACT nasal spray Place 2 sprays into both nostrils daily. 16 g 6  . gabapentin (NEURONTIN) 100 MG capsule Take 2 capsules (200 mg total) by mouth at bedtime. 60 capsule 3  . glucose blood (BAYER CONTOUR TEST) test strip Use BID 100 each 11  . Linaclotide (LINZESS) 145 MCG CAPS capsule Take 1 capsule (145 mcg total) by mouth daily. 35 capsule 0  . lisinopril-hydrochlorothiazide (PRINZIDE,ZESTORETIC) 20-25 MG tablet TAKE 1 TABLET BY MOUTH DAILY. (Patient taking differently: Take 1 tablet by mouth daily. ) 90 tablet 1  . Multiple Vitamins-Minerals (CENTRUM SILVER PO) Take 1 tablet by mouth daily.      Marland Kitchen omeprazole (PRILOSEC) 40 MG capsule Take 1 capsule (40 mg total) by mouth daily. 90 capsule 1    Allergies as of 06/07/2018 - Review Complete 06/07/2018  Allergen Reaction Noted  . Crestor [rosuvastatin]  09/07/2011  . Metformin  01/22/2009    Family History  Problem Relation Age of Onset  . Diabetes Other   . Hypertension Other     Social History   Socioeconomic History  . Marital status: Married    Spouse name: Not on file  . Number of children: Not on file  . Years of education: Not on file  . Highest education  level: Not on file  Occupational History  . Not on file  Social Needs  . Financial resource strain: Not on file  . Food insecurity:    Worry: Not on file    Inability: Not on file  . Transportation needs:    Medical: Not on file    Non-medical: Not on file  Tobacco Use  . Smoking status: Never Smoker  . Smokeless tobacco: Never Used  Substance and Sexual Activity  . Alcohol use: No  . Drug use: No  . Sexual activity: Not Currently  Lifestyle  . Physical activity:    Days per week: Not on file    Minutes per  session: Not on file  . Stress: Not on file  Relationships  . Social connections:    Talks on phone: Not on file    Gets together: Not on file    Attends religious service: Not on file    Active member of club or organization: Not on file    Attends meetings of clubs or organizations: Not on file    Relationship status: Not on file  . Intimate partner violence:    Fear of current or ex partner: Not on file    Emotionally abused: Not on file    Physically abused: Not on file    Forced sexual activity: Not on file  Other Topics Concern  . Not on file  Social History Narrative  . Not on file    Review of Systems: Gen: 20lb wt loss past year, no fever or sweats, often feels chilled  CV: no chest pain or palpitations Resp: + sob, no cough  GI: see HPI  GU : increased urinary frequency at times, no dysuria MS: right hip pain Derm: Denies rash or skin ulcers Psych: Denies depression or anxiety. Heme: Denies bruising, bleeding, and enlarged lymph nodes. Neuro:  Denies any headaches, + dizziness  Endo:  Hx DM II  Physical Exam: Vital signs in last 24 hours: Temp:  [97.7 F (36.5 C)-98.1 F (36.7 C)] 97.7 F (36.5 C) (03/26 1201) Pulse Rate:  [85-116] 85 (03/26 1254) Resp:  [14-19] 19 (03/26 1254) BP: (128-183)/(58-79) 183/79 (03/26 1254) SpO2:  [96 %-100 %] 100 % (03/26 1254) Weight:  [84.8 kg-85.4 kg] 85.4 kg (03/26 1201)   General:   Alert,   Well-developed, well-nourished, pleasant and cooperative in NAD Head:  Normocephalic and atraumatic. Eyes:  Sclera clear, no icterus. Conjunctiva pink. Ears:  Normal auditory acuity. Nose:  No deformity, discharge,  or lesions. Mouth:  No deformity or lesions.   Neck:  Supple. Lungs:  Clear throughout to auscultation.   No wheezes, crackles, or rhonchi. Heart:  Rhythm briefly irregular, no murmurs  Abdomen:  Soft,nontender, BS active,nonpalp mass or hsm.   Rectal: Thick black stool in rectum, no mass. Msk:  Symmetrical without gross deformities. . Pulses:  Normal pulses noted. Extremities:  Without clubbing or edema. Neurologic:  Alert and  oriented x4;  grossly normal neurologically. Skin:  Intact without significant lesions or rashes.. Psych:  Alert and cooperative. Normal mood and affect.  Intake/Output from previous day: No intake/output data recorded. Intake/Output this shift: No intake/output data recorded.  Lab Results: Recent Labs    06/05/18 1026 06/07/18 1124  WBC 8.7 7.5  HGB 8.0 Repeated and verified X2.* 6.9 Repeated and verified X2.*  HCT 24.2* 20.9 Repeated and verified X2.*  PLT 216.0 256.0   BMET No results for input(s): NA, K, CL, CO2, GLUCOSE, BUN, CREATININE, CALCIUM in the last 72 hours. LFT No results for input(s): PROT, ALBUMIN, AST, ALT, ALKPHOS, BILITOT, BILIDIR, IBILI in the last 72 hours. PT/INR Recent Labs    06/05/18 1026  LABPROT 12.7  INR 1.1*   IMPRESSION/PLAN:   1. 85 y.o. female with painless red bloody diarrhea, possible ischemic colitis vs diverticular bleed. No further red blood per the rectum today, patient now passing melenic stool. She is hemodynamically stable.  PRBC 1 unit ordered.  -monitor H/H closely, repeat H/H post transfuion -NPO for now -IVF LR @ 75cc/hr  2. UGI bleed, Melena. Infrequently takes Aleve for arthritis. Takes Prilosec 60m QD. Will need EGD to rule out ulcers, malignant process.  Hg 6.6. BUN 51 (base line  BUN 44-38). -Plan for EGD 06/08/2018, possible colonoscopy  -Protonix 40m IV bid -Monitor for further melena, hematochezia   3. Anemia secondary to GI bleed and chronic kidney dz. Hg 6.6 << 6.9 << 8.0 << 9.0. Base line Hg 13.3.  -agree with PRBC transfusion, keep Hg > 7.0.  4. Acute on CKD. Cr 3.48. Base line Cr 3.13  5. HTN   6. DM II    CNoralyn Pick 06/07/2018, 1:14 PM    Attending physician's note   I have taken an interval history, reviewed the chart and examined the patient. I agree with the Advanced Practitioner's note, impression and recommendations.   89yrld with DM2, HTN, Gout, HLD, CKD4 (GFR 12 ml/min)  with painless hematochezia/melena over the last 4 to 5 days. Hb 12 (baseline) to 6.6 s/p 1U PRBC. BUN has increased over baseline. HD stable. H/O intermittent nonsteroidal use.  Wt loss >20lb over last year is concerning. Remote neg colon 2004. Has new onset A Fib.  Plan: CT abdo/pelvis with PO contrast only today. EGD in AM (Dr BeTarri Glenn  If negative then she would need colonoscopy (was not keen on taking prep). Trend CBC (keep Hb>7). IV Protonix. No nonsteroidals.  RaCarmell AustriaMD

## 2018-06-07 NOTE — ED Notes (Signed)
ED TO INPATIENT HANDOFF REPORT  ED Nurse Name and Phone #: Marylin Crosby RN  S Name/Age/Gender Danielle Frazier 85 y.o. female Room/Bed: WA25/WA25  Code Status   Code Status: DNR  Home/SNF/Other Home Patient oriented to: self, place, time and situation Is this baseline? Yes   Triage Complete: Triage complete  Chief Complaint LOW Hgb  Triage Note Pt had rectal bleeding since Saturday. Has been to PCP 3 times this week for blood work and everyday Hgb gotten lower. Today 6.9 so was sent to Ed for transfusion. Denies pain or taking blood thinners.    Allergies Allergies  Allergen Reactions  . Crestor [Rosuvastatin]     Muscle aches  . Metformin     REACTION: renal damage    Level of Care/Admitting Diagnosis ED Disposition    ED Disposition Condition [facility] Area: [place] [[id number]]  Level of Care: Telemetry [5]  Admit to tele based on following criteria: Other see comments  Comments: new afib  Diagnosis: GI bleeding [270623]  Admitting Physician: Karmen Bongo [[id number]]  Attending Physician: Karmen Bongo [[id number]]  Estimated length of stay: past midnight tomorrow  Certification:: I certify this patient will need inpatient services for at least 2 midnights  PT Class (Do Not Modify): Inpatient [101]  PT Acc Code (Do Not Modify): Private [1]       B Medical/Surgery History Past Medical History:  Diagnosis Date  . Diabetes mellitus    type II  . Gout   . Hyperlipidemia   . Hypertension   . LBP (low back pain)    foraminal narrowing at L4-5 and disc bulge at L5- S1 on MRI 6-08  . Renal insufficiency   . Urinary incontinence    History reviewed. No pertinent surgical history.   A IV Location/Drains/Wounds Patient Lines/Drains/Airways Status   Active Line/Drains/Airways    Name:   Placement date:   Placement time:   Site:   Days:   Peripheral IV 06/07/18 Right Antecubital   06/07/18    1254    Antecubital   less than 1   Peripheral IV 06/07/18 Left Antecubital   06/07/18    1254    Antecubital   less than 1          Intake/Output Last 24 hours No intake or output data in the 24 hours ending 06/07/18 1512  Labs/Imaging Results for orders placed or performed during the hospital encounter of 06/07/18 (from the past 48 hour(s))  POC occult blood, ED     Status: Abnormal   Collection Time: 06/07/18 12:38 PM  Result Value Ref Range   Fecal Occult Bld POSITIVE (A) NEGATIVE  ABO/Rh     Status: None (Preliminary result)   Collection Time: 06/07/18 12:57 PM  Result Value Ref Range   ABO/RH(D)      B POS Performed at [facility], [place] [address], [place], [place] [address]   Comprehensive metabolic panel     Status: Abnormal   Collection Time: 06/07/18 12:58 PM  Result Value Ref Range   Sodium 142 135 - 145 mmol/L   Potassium 3.4 (L) 3.5 - 5.1 mmol/L   Chloride 104 98 - 111 mmol/L   CO2 25 22 - 32 mmol/L   Glucose, Bld 142 (H) 70 - 99 mg/dL   BUN 51 (H) 8 - 23 mg/dL   Creatinine, Ser 3.48 (H) 0.44 - 1.00 mg/dL   Calcium 9.7 8.9 - 10.3 mg/dL   Total  Protein 6.2 (L) 6.5 - 8.1 g/dL   Albumin 3.3 (L) 3.5 - 5.0 g/dL   AST 20 15 - 41 U/L   ALT 13 0 - 44 U/L   Alkaline Phosphatase 123 38 - 126 U/L   Total Bilirubin 0.4 0.3 - 1.2 mg/dL   GFR calc non Af Amer 11 (L) >60 mL/min   GFR calc Af Amer 13 (L) >60 mL/min   Anion gap 13 5 - 15    Comment: Performed at [facility], [place] [address], [place], [place] [address]  CBC WITH DIFFERENTIAL     Status: Abnormal   Collection Time: 06/07/18 12:58 PM  Result Value Ref Range   WBC 7.2 4.0 - 10.5 K/uL   RBC 2.07 (L) 3.87 - 5.11 MIL/uL   Hemoglobin 6.6 (LL) 12.0 - 15.0 g/dL    Comment: REPEATED TO VERIFY THIS CRITICAL RESULT HAS VERIFIED AND BEEN CALLED TO ZULETTA,K. RN BY NICOLE MCCOY ON 03 26 2020 AT 0932, AND HAS BEEN READ BACK. CRITICAL RESULT VERIFIED    HCT 22.9 (L) 36.0 - 46.0 %   MCV 110.6 (H) 80.0 - 100.0 fL    MCH 31.9 26.0 - 34.0 pg   MCHC 28.8 (L) 30.0 - 36.0 g/dL   RDW 14.2 11.5 - 15.5 %   Platelets 270 150 - 400 K/uL   nRBC 0.0 0.0 - 0.2 %   Neutrophils Relative % 75 %   Neutro Abs 5.3 1.7 - 7.7 K/uL   Lymphocytes Relative 16 %   Lymphs Abs 1.2 0.7 - 4.0 K/uL   Monocytes Relative 7 %   Monocytes Absolute 0.5 0.1 - 1.0 K/uL   Eosinophils Relative 1 %   Eosinophils Absolute 0.1 0.0 - 0.5 K/uL   Basophils Relative 1 %   Basophils Absolute 0.0 0.0 - 0.1 K/uL   Immature Granulocytes 0 %   Abs Immature Granulocytes 0.03 0.00 - 0.07 K/uL   Polychromasia PRESENT     Comment: Performed at [facility], [place] [address] [place], [place] [address]  Type and screen [place]     Status: None (Preliminary result)   Collection Time: 06/07/18 12:58 PM  Result Value Ref Range   ABO/RH(D) B POS    Antibody Screen NEG    Sample Expiration 06/10/2018    Unit Number [id number]    Blood Component Type RED CELLS,LR    Unit division 00    Status of Unit ALLOCATED    Transfusion Status OK TO TRANSFUSE    Crossmatch Result      Compatible Performed at [facility], [place] [address] [place], [place] [address]   Prepare RBC     Status: None   Collection Time: 06/07/18  1:00 PM  Result Value Ref Range   Order Confirmation      ORDER PROCESSED BY BLOOD BANK Performed at [facility], [place] [address] [place], [place] [address]    No results found.  Pending Labs Unresulted Labs (From admission, onward)    Start     Ordered   06/08/18 6073  Basic metabolic panel  Tomorrow morning,   R     06/07/18 1418   06/08/18 0500  CBC  Tomorrow morning,   R     06/07/18 1418   06/07/18 1418  CBC  Once-Timed,   R    Comments:  RN to draw 1 hour post-transfusion; please call result to covering MD//APP  06/07/18 1418          Vitals/Pain Today's Vitals   06/07/18 1207 06/07/18 1254 06/07/18 1400 06/07/18 1507  BP:  (!)  183/79 (!) 162/83 (!) 165/68  Pulse:  85 77 78  Resp:  19 (!) 21 (!) 25  Temp:      TempSrc:      SpO2:  100% 99% 100%  Weight:      Height:      PainSc: 0-No pain       Isolation Precautions No active isolations  Medications Medications  0.9 %  sodium chloride infusion (has no administration in time range)  allopurinol (ZYLOPRIM) tablet 150 mg (has no administration in time range)  nebivolol (BYSTOLIC) tablet 5 mg (has no administration in time range)  clonazePAM (KLONOPIN) tablet 0.5 mg (has no administration in time range)  gabapentin (NEURONTIN) capsule 200 mg (has no administration in time range)  fluticasone (FLONASE) 50 MCG/ACT nasal spray 2 spray (has no administration in time range)  acetaminophen (TYLENOL) tablet 650 mg (has no administration in time range)    Or  acetaminophen (TYLENOL) suppository 650 mg (has no administration in time range)  ondansetron (ZOFRAN) tablet 4 mg (has no administration in time range)    Or  ondansetron (ZOFRAN) injection 4 mg (has no administration in time range)  pantoprazole (PROTONIX) 80 mg in sodium chloride 0.9 % 250 mL (0.32 mg/mL) infusion (has no administration in time range)  sodium chloride flush (NS) 0.9 % injection 3 mL (has no administration in time range)  lactated ringers infusion (has no administration in time range)  pantoprazole (PROTONIX) injection 40 mg (40 mg Intravenous Given 06/07/18 1255)    Mobility walks Moderate fall risk   Focused Assessments    R Recommendations: See Admitting Provider Note  Report given to:   Additional Notes:

## 2018-06-07 NOTE — ED Notes (Signed)
Hospitalist at bedside 

## 2018-06-07 NOTE — H&P (Signed)
History and Physical    Danielle Frazier QQI:[id number] DOB: 1930-04-11 DOA: 06/07/2018  PCP: Danielle Lima, MD Consultants:  [facility] - ENT; last colonoscopy was at [facility] in 2004 Patient coming from:  Home - lives with husband; NOK: Danielle Frazier, [phone]  Chief Complaint: Rectal bleeding  HPI: Danielle Frazier is a 85 y.o. female with medical history significant of CKD; HTN; HLD; and DM presenting with rectal bleeding.  She noticed bloody stools, started Saturday and Sunday.  She called her PCP Monday and he saw her and recommended a transfusion.  She returned Tuesday and her hemoglobin was dropping and he suggested she return today.  She came back today and it was even lower and he sent her to the ER.  She is still having bloody stools that are very dark.  Initially it was bright red and then would get darker, straight blood.  Today was the first actual stool she has had and it was black.  She has not had any abdominal pain.  She has not had nausea.  She does not have an appetite.  She has been feeling light-headed/dizzy since Tuesday.  No fevers.  She has not previously had bleeding.  She took Aleve last week, maybe 6 over a 3-day period, due to hip pain.    ED Course:  Melena, decreasing Hgb x 3 days.  Dizzy, light-headed, Hgb 6.6.  [facility] consulted.  Took Aleve last week for R hip pain.  No h/o GI bleed.  Review of Systems: As per HPI; otherwise review of systems reviewed and negative.   Ambulatory Status:  Ambulates without assistance or with a cane for balance  Past Medical History:  Diagnosis Date  . Diabetes mellitus    type II  . Gout   . Hyperlipidemia   . Hypertension   . LBP (low back pain)    foraminal narrowing at L4-5 and disc bulge at L5- S1 on MRI 6-08  . Renal insufficiency   . Urinary incontinence     History reviewed. No pertinent surgical history.  Social History   Socioeconomic History  . Marital status: Married    Spouse name: Not on file  .  Number of children: Not on file  . Years of education: Not on file  . Highest education level: Not on file  Occupational History  . Not on file  Social Needs  . Financial resource strain: Not on file  . Food insecurity:    Worry: Not on file    Inability: Not on file  . Transportation needs:    Medical: Not on file    Non-medical: Not on file  Tobacco Use  . Smoking status: Never Smoker  . Smokeless tobacco: Never Used  Substance and Sexual Activity  . Alcohol use: No  . Drug use: No  . Sexual activity: Not Currently  Lifestyle  . Physical activity:    Days per week: Not on file    Minutes per session: Not on file  . Stress: Not on file  Relationships  . Social connections:    Talks on phone: Not on file    Gets together: Not on file    Attends religious service: Not on file    Active member of club or organization: Not on file    Attends meetings of clubs or organizations: Not on file    Relationship status: Not on file  . Intimate partner violence:    Fear of current or ex partner: Not on  file    Emotionally abused: Not on file    Physically abused: Not on file    Forced sexual activity: Not on file  Other Topics Concern  . Not on file  Social History Narrative  . Not on file    Allergies  Allergen Reactions  . Crestor [Rosuvastatin]     Muscle aches  . Metformin     REACTION: renal damage    Family History  Problem Relation Age of Onset  . Diabetes Other   . Hypertension Other     Prior to Admission medications   Medication Sig Start Date End Date Taking? Authorizing Provider  allopurinol (ZYLOPRIM) 300 MG tablet TAKE 1/2 TABLET BY MOUTH DAILY Patient taking differently: Take 150 mg by mouth daily.  11/29/17   Danielle Lima, MD  Blood Glucose Monitoring Suppl (BAYER CONTOUR MONITOR) W/DEVICE KIT 1 Act by Does not apply route 2 (two) times daily. 05/06/11   Danielle Lima, MD  BYSTOLIC 5 MG tablet TAKE 1 TABLET BY MOUTH DAILY Patient taking  differently: Take 5 mg by mouth daily.  05/07/18   Danielle Lima, MD  cholecalciferol (VITAMIN D) 1000 units tablet Take 2,000 Units by mouth daily.    [provider]  clonazePAM (KLONOPIN) 0.5 MG tablet TAKE 1 TABLET BY MOUTH TWICE A DAY Patient taking differently: Take 0.5 mg by mouth 2 (two) times daily.  10/24/17   Danielle Lima, MD  fluticasone (FLONASE) 50 MCG/ACT nasal spray Place 2 sprays into both nostrils daily. 04/04/18   Marrian Salvage, FNP  gabapentin (NEURONTIN) 100 MG capsule Take 2 capsules (200 mg total) by mouth at bedtime. 10/16/17   Lyndal Pulley, DO  glucose blood (BAYER CONTOUR TEST) test strip Use BID 05/06/11   Danielle Lima, MD  Linaclotide [facility]) 145 MCG CAPS capsule Take 1 capsule (145 mcg total) by mouth daily. 04/24/13   Danielle Lima, MD  lisinopril-hydrochlorothiazide (PRINZIDE,ZESTORETIC) 20-25 MG tablet TAKE 1 TABLET BY MOUTH DAILY. Patient taking differently: Take 1 tablet by mouth daily.  11/29/17   Danielle Lima, MD  Multiple Vitamins-Minerals (CENTRUM SILVER PO) Take 1 tablet by mouth daily.      [provider]  omeprazole (PRILOSEC) 40 MG capsule Take 1 capsule (40 mg total) by mouth daily. 06/05/18   Danielle Lima, MD    Physical Exam: Vitals:   06/07/18 1254 06/07/18 1400 06/07/18 1507 06/07/18 1550  BP: (!) 183/79 (!) 162/83 (!) 165/68 (!) 109/93  Pulse: 85 77 78 (!) 102  Resp: 19 (!) 21 (!) 25 14  Temp:    97.9 F (36.6 C)  TempSrc:    Oral  SpO2: 100% 99% 100% 100%  Weight:      Height:         . General:  Appears calm and comfortable but fatigued . Eyes:  PERRL, EOMI, normal lids, iris . ENT:  grossly normal hearing, lips & tongue, mmm . Neck:  no LAD, masses or thyromegaly . Cardiovascular:  Irregularly irregular but rate controlled, no m/r/g. No LE edema.  Marland Kitchen Respiratory:   CTA bilaterally with no wheezes/rales/rhonchi.  Normal respiratory effort. . Abdomen:  soft, NT, ND, NABS . Skin:  no rash or  induration seen on limited exam . Musculoskeletal:  grossly normal tone BUE/BLE, good ROM, no bony abnormality . Psychiatric:  grossly normal mood and affect, speech fluent and appropriate, AOx3 . Neurologic:  CN 2-12 grossly intact, moves all extremities in coordinated  fashion, sensation intact    Radiological Exams on Admission: No results found.  EKG: Independently reviewed.  Afib with rate 79; nonspecific ST changes with no evidence of acute ischemia   Labs on Admission: I have personally reviewed the available labs and imaging studies at the time of the admission.  Pertinent labs:   Glucose 142 BUN 51/Creatinine 3.48/GFR 11; 53/3.58/12 on 3/23, which appears to be approximately baseline WBC 7.2 Hgb 6.6, 6.9 earlier today, 8.0 on 3/24, 9.0 on 3/23 Heme positive  Assessment/Plan Principal Problem:   GI bleeding Active Problems:   DM (diabetes mellitus), type 2 with renal complications (HCC)   Essential hypertension   CKD (chronic kidney disease) stage 5, GFR less than 15 ml/min (HCC)   Anemia associated with acute blood loss   GI bleeding -Patient gives a h/o painless, bright red rectal bleeding which then changed to tarry stools -This sounds most c/w small bowel bleeding, suspicious for an AVM -She does have reported h/o Aleve use multiple times last week and so brisk UGI bleeding which has slowed since she stopped using Aleve is another consideration -Diverticular bleeding is also a consideration -Her bleeding no longer appears to be brisk enough to make tagged RBC scan useful at this time -Suggest EGD tomorrow and, if negative, capsule endoscopy and/or colonoscopy -Will keep NPO for now -Will give Protonix drip for now given uncertainty of source of bleeding  -GI has been consulted -She is afebrile at this time with tachycardia, and no leukocytosis; will not give antibiotics at this time.  -Inpatient admission given outpatient failure and high likelihood for need for  one of more procedures and ongoing monitoring   Symptomatic anemia from acute blood loss -Patient has been followed by her outpatient PCP with clear downtrending in Hgb correlating with rectal bleeding -She is light-headed and dizzy with standing -Given current blood shortages associated with COVID, will transfuse 1 unit PRBC to start -Recheck CBC 1 hour after transfusion and again tomorrow AM -If Hgb <7 and/or ongoing symptomatology, patient may need additional units  Afib, new -Patient denies prior h/o afib but was noted to have an irregularly irregular rhythm on auscultation and afib on telemetry; review of her EKG appears to concur with diagnosis of afib -This may be associated with her anemia -For now, will monitor on telemetry -She is rate controlled; Bystolic will be continued -She is not currently a candidate for anticoagulation given her acute bleeding; this has been discussed with the patient  CKD -Her GFR in 10/19 was 15 and it was 17 in 4/19; the patient appears to have progressed to stage 5 CKD -She is unlikely to be a dialysis candidate and so this may become a terminal condition -For now, it is likely mildly exacerbated by acute volume loss in the setting of GI bleeding -Will follow and defer further discussion to her PCP -Lisinopril/HCTZ has been discontinued  HTN -Continue Bystolic -Lisinopril-HCTZ has been discontinued  DM -Recent A1c was 6.0 on 3/23 -She does not appear to be taking medications for this issue -Given the unlikely nature of long-term sequelae of uncontrolled long-term hyperglycemia in this patient, this is likely little utility in continuing to monitor this issue over time.     DVT prophylaxis:  SCDs Code Status:  DNR - confirmed with patient Family Communication: None present Disposition Plan:  Home once clinically improved Consults called: GI  Admission status: Admit - It is my clinical opinion that admission to INPATIENT is reasonable and  necessary because  of the expectation that this patient will require hospital care that crosses at least 2 midnights to treat this condition based on the medical complexity of the problems presented.  Given the aforementioned information, the predictability of an adverse outcome is felt to be significant.     Karmen Bongo MD [facility]   How to contact the [facility] Attending or Consulting provider [place] or covering provider during after hours [place], for this patient?  1. Check the care team in [facility] and look for a) attending/consulting [facility] provider listed and b) the [facility] team listed 2. Log into [email or web address] and use [place]'s universal password to access. If you do not have the password, please contact the hospital operator. 3. Locate the [facility] provider you are looking for under [facility] and page to a number that you can be directly reached. 4. If you still have difficulty reaching the provider, please page the [facility] (Director on Call) for the Hospitalists listed on amion for assistance.   06/07/2018, 3:55 PM

## 2018-06-07 NOTE — H&P (View-Only) (Signed)
Referring Provider: Dr. Karmen Bongo  Primary Care Physician:  [name], MD Primary Gastroenterologist:  Dr. Scarlette Shorts   Reason for Consultation:  GI  Bleed, melena  HPI: Danielle Frazier is a 85 y.o. female with a history of DM II, gout, HTN, hyperlipidemia and renal insufficiency. She developed watery bloody diarrhea on Saturday 06/02/2018. She reported passing "gushing" watery diarrhea with bright red blood, second episode of diarrhea blood was darker and the third episode of diarrhea the blood was dark red, not black. No abdominal pain, nausea or vomiting. She reported having the same bloody diarrhea on Sunday. She developed dizziness and lightheadedness. She was seen by her PCP, Dr. [name], on Monday 06/04/2018. Hg 10.2. (base line Hg 13.3 5 months ago). Repeat labs 3/24 Hg 8. She declined to go to the ED for admission and PRBC transfusion. Repeat labs 3/25 Hg dropped to 8.7. She was seen by Dr. Ronnald Ramp this morning. She reported passing a black soft stool this morning with continued weakness, dizziness, feeling light headed and SOB. She last took Aleve 2 tabs 2 weeks ago for right hip pain. She does not take ASA. No other NSAID use. History of GERD. She takes Prilosec 77m QD. Possibly had an EGD in the past. Never had GIB or ulcers. No chest pain or palpitations. She was sent to the ED for further evaluation.   ED Course: WBC 7.5. Hg 6.9. HCT 20.9. MCV 100.5. PLT 256. Iron 59. Ferritin 64.3.  Repeat Hg 6.6. HCT 22.9. Na 142. K 3.4. BUN 51 (base line BUN 44-38). Cr. 3.48 (baseline Cr 3.13- 2.79). Glu 142. Alk phos 123. Albumin 3.3. AST 20. ALT 13. T. Bili 0.4.  1 unit of PRBCs ordered.  GI History:  07/25/2002 Colonoscopy by Dr. PHenrene Pastor showed sigmoid diverticulosis    Past Medical History:  Diagnosis Date  . Diabetes mellitus    type II  . Gout   . Hyperlipidemia   . Hypertension   . LBP (low back pain)    foraminal narrowing at L4-5 and disc bulge at L5- S1 on MRI 6-08  .  Renal insufficiency   . Urinary incontinence     History reviewed. No pertinent surgical history.  Prior to Admission medications   Medication Sig Start Date End Date Taking? Authorizing Provider  allopurinol (ZYLOPRIM) 300 MG tablet TAKE 1/2 TABLET BY MOUTH DAILY Patient taking differently: Take 150 mg by mouth daily.  11/29/17   JJanith Lima MD  Blood Glucose Monitoring Suppl (BAYER CONTOUR MONITOR) W/DEVICE KIT 1 Act by Does not apply route 2 (two) times daily. 05/06/11   JJanith Lima MD  BYSTOLIC 5 MG tablet TAKE 1 TABLET BY MOUTH DAILY Patient taking differently: Take 5 mg by mouth daily.  05/07/18   JJanith Lima MD  cholecalciferol (VITAMIN D) 1000 units tablet Take 2,000 Units by mouth daily.    [provider]  clonazePAM (KLONOPIN) 0.5 MG tablet TAKE 1 TABLET BY MOUTH TWICE A DAY Patient taking differently: Take 0.5 mg by mouth 2 (two) times daily.  10/24/17   JJanith Lima MD  fluticasone (FLONASE) 50 MCG/ACT nasal spray Place 2 sprays into both nostrils daily. 04/04/18   MMarrian Salvage FNP  gabapentin (NEURONTIN) 100 MG capsule Take 2 capsules (200 mg total) by mouth at bedtime. 10/16/17   SLyndal Pulley DO  glucose blood (BAYER CONTOUR TEST) test strip Use BID 05/06/11   JJanith Lima MD  Linaclotide (Rolan Lipa  145 MCG CAPS capsule Take 1 capsule (145 mcg total) by mouth daily. 04/24/13   [name], MD  lisinopril-hydrochlorothiazide (PRINZIDE,ZESTORETIC) 20-25 MG tablet TAKE 1 TABLET BY MOUTH DAILY. Patient taking differently: Take 1 tablet by mouth daily.  11/29/17   [name], MD  Multiple Vitamins-Minerals (CENTRUM SILVER PO) Take 1 tablet by mouth daily.      [provider]  omeprazole (PRILOSEC) 40 MG capsule Take 1 capsule (40 mg total) by mouth daily. 06/05/18   [name], MD    Current Facility-Administered Medications  Medication Dose Route Frequency Provider Last Rate Last Dose  . 0.9 %  sodium chloride infusion   10 mL/hr Intravenous Once Lennice Sites, DO       Current Outpatient Medications  Medication Sig Dispense Refill  . allopurinol (ZYLOPRIM) 300 MG tablet TAKE 1/2 TABLET BY MOUTH DAILY (Patient taking differently: Take 150 mg by mouth daily. ) 45 tablet 1  . Blood Glucose Monitoring Suppl (BAYER CONTOUR MONITOR) W/DEVICE KIT 1 Act by Does not apply route 2 (two) times daily. 100 kit 0  . BYSTOLIC 5 MG tablet TAKE 1 TABLET BY MOUTH DAILY (Patient taking differently: Take 5 mg by mouth daily. ) 90 tablet 1  . cholecalciferol (VITAMIN D) 1000 units tablet Take 2,000 Units by mouth daily.    . clonazePAM (KLONOPIN) 0.5 MG tablet TAKE 1 TABLET BY MOUTH TWICE A DAY (Patient taking differently: Take 0.5 mg by mouth 2 (two) times daily. ) 60 tablet 3  . fluticasone (FLONASE) 50 MCG/ACT nasal spray Place 2 sprays into both nostrils daily. 16 g 6  . gabapentin (NEURONTIN) 100 MG capsule Take 2 capsules (200 mg total) by mouth at bedtime. 60 capsule 3  . glucose blood (BAYER CONTOUR TEST) test strip Use BID 100 each 11  . Linaclotide (LINZESS) 145 MCG CAPS capsule Take 1 capsule (145 mcg total) by mouth daily. 35 capsule 0  . lisinopril-hydrochlorothiazide (PRINZIDE,ZESTORETIC) 20-25 MG tablet TAKE 1 TABLET BY MOUTH DAILY. (Patient taking differently: Take 1 tablet by mouth daily. ) 90 tablet 1  . Multiple Vitamins-Minerals (CENTRUM SILVER PO) Take 1 tablet by mouth daily.      Marland Kitchen omeprazole (PRILOSEC) 40 MG capsule Take 1 capsule (40 mg total) by mouth daily. 90 capsule 1    Allergies as of 06/07/2018 - Review Complete 06/07/2018  Allergen Reaction Noted  . Crestor [rosuvastatin]  09/07/2011  . Metformin  01/22/2009    Family History  Problem Relation Age of Onset  . Diabetes Other   . Hypertension Other     Social History   Socioeconomic History  . Marital status: Married    Spouse name: Not on file  . Number of children: Not on file  . Years of education: Not on file  . Highest education  level: Not on file  Occupational History  . Not on file  Social Needs  . Financial resource strain: Not on file  . Food insecurity:    Worry: Not on file    Inability: Not on file  . Transportation needs:    Medical: Not on file    Non-medical: Not on file  Tobacco Use  . Smoking status: Never Smoker  . Smokeless tobacco: Never Used  Substance and Sexual Activity  . Alcohol use: No  . Drug use: No  . Sexual activity: Not Currently  Lifestyle  . Physical activity:    Days per week: Not on file    Minutes per  session: Not on file  . Stress: Not on file  Relationships  . Social connections:    Talks on phone: Not on file    Gets together: Not on file    Attends religious service: Not on file    Active member of club or organization: Not on file    Attends meetings of clubs or organizations: Not on file    Relationship status: Not on file  . Intimate partner violence:    Fear of current or ex partner: Not on file    Emotionally abused: Not on file    Physically abused: Not on file    Forced sexual activity: Not on file  Other Topics Concern  . Not on file  Social History Narrative  . Not on file    Review of Systems: Gen: 20lb wt loss past year, no fever or sweats, often feels chilled  CV: no chest pain or palpitations Resp: + sob, no cough  GI: see HPI  GU : increased urinary frequency at times, no dysuria MS: right hip pain Derm: Denies rash or skin ulcers Psych: Denies depression or anxiety. Heme: Denies bruising, bleeding, and enlarged lymph nodes. Neuro:  Denies any headaches, + dizziness  Endo:  Hx DM II  Physical Exam: Vital signs in last 24 hours: Temp:  [97.7 F (36.5 C)-98.1 F (36.7 C)] 97.7 F (36.5 C) (03/26 1201) Pulse Rate:  [85-116] 85 (03/26 1254) Resp:  [14-19] 19 (03/26 1254) BP: (128-183)/(58-79) 183/79 (03/26 1254) SpO2:  [96 %-100 %] 100 % (03/26 1254) Weight:  [84.8 kg-85.4 kg] 85.4 kg (03/26 1201)   General:   Alert,   Well-developed, well-nourished, pleasant and cooperative in NAD Head:  Normocephalic and atraumatic. Eyes:  Sclera clear, no icterus. Conjunctiva pink. Ears:  Normal auditory acuity. Nose:  No deformity, discharge,  or lesions. Mouth:  No deformity or lesions.   Neck:  Supple. Lungs:  Clear throughout to auscultation.   No wheezes, crackles, or rhonchi. Heart:  Rhythm briefly irregular, no murmurs  Abdomen:  Soft,nontender, BS active,nonpalp mass or hsm.   Rectal: Thick black stool in rectum, no mass. Msk:  Symmetrical without gross deformities. . Pulses:  Normal pulses noted. Extremities:  Without clubbing or edema. Neurologic:  Alert and  oriented x4;  grossly normal neurologically. Skin:  Intact without significant lesions or rashes.. Psych:  Alert and cooperative. Normal mood and affect.  Intake/Output from previous day: No intake/output data recorded. Intake/Output this shift: No intake/output data recorded.  Lab Results: Recent Labs    06/05/18 1026 06/07/18 1124  WBC 8.7 7.5  HGB 8.0 Repeated and verified X2.* 6.9 Repeated and verified X2.*  HCT 24.2* 20.9 Repeated and verified X2.*  PLT 216.0 256.0   BMET No results for input(s): NA, K, CL, CO2, GLUCOSE, BUN, CREATININE, CALCIUM in the last 72 hours. LFT No results for input(s): PROT, ALBUMIN, AST, ALT, ALKPHOS, BILITOT, BILIDIR, IBILI in the last 72 hours. PT/INR Recent Labs    06/05/18 1026  LABPROT 12.7  INR 1.1*   IMPRESSION/PLAN:   1. 85 y.o. female with painless red bloody diarrhea, possible ischemic colitis vs diverticular bleed. No further red blood per the rectum today, patient now passing melenic stool. She is hemodynamically stable.  PRBC 1 unit ordered.  -monitor H/H closely, repeat H/H post transfuion -NPO for now -IVF LR @ 75cc/hr  2. UGI bleed, Melena. Infrequently takes Aleve for arthritis. Takes Prilosec 76m QD. Will need EGD to rule out ulcers, malignant process.  Hg 6.6. BUN 51 (base line  BUN 44-38). -Plan for EGD 06/08/2018, possible colonoscopy  -Protonix 40m IV bid -Monitor for further melena, hematochezia   3. Anemia secondary to GI bleed and chronic kidney dz. Hg 6.6 << 6.9 << 8.0 << 9.0. Base line Hg 13.3.  -agree with PRBC transfusion, keep Hg > 7.0.  4. Acute on CKD. Cr 3.48. Base line Cr 3.13  5. HTN   6. DM II    CNoralyn Pick 06/07/2018, 1:14 PM    Attending physician's note   I have taken an interval history, reviewed the chart and examined the patient. I agree with the Advanced Practitioner's note, impression and recommendations.   89yrld with DM2, HTN, Gout, HLD, CKD4 (GFR 12 ml/min)  with painless hematochezia/melena over the last 4 to 5 days. Hb 12 (baseline) to 6.6 s/p 1U PRBC. BUN has increased over baseline. HD stable. H/O intermittent nonsteroidal use.  Wt loss >20lb over last year is concerning. Remote neg colon 2004. Has new onset A Fib.  Plan: CT abdo/pelvis with PO contrast only today. EGD in AM (Dr BeTarri Glenn  If negative then she would need colonoscopy (was not keen on taking prep). Trend CBC (keep Hb>7). IV Protonix. No nonsteroidals.  RaCarmell AustriaMD

## 2018-06-07 NOTE — ED Notes (Signed)
GI at bedside

## 2018-06-07 NOTE — ED Provider Notes (Signed)
[facility] DEPT Provider Note   CSN: [id number] Arrival date & time: 06/07/18  1157    History   Chief Complaint Chief Complaint  Patient presents with  . low Hgb  . Rectal Bleeding    HPI Danielle Frazier is a 85 y.o. female.     The history is provided by the patient.  Rectal Bleeding  Quality:  Black and tarry Amount:  Moderate Duration:  2 days Timing:  Constant Chronicity:  New Context: spontaneously   Similar prior episodes: no   Relieved by:  Nothing Worsened by:  Nothing Associated symptoms: dizziness and light-headedness   Associated symptoms: no abdominal pain, no fever and no vomiting   Risk factors: NSAID use (recently last week took several days of aleve for chronic right hip pain )   Risk factors: no anticoagulant use and no liver disease     Past Medical History:  Diagnosis Date  . Diabetes mellitus    type II  . Gout   . Hyperlipidemia   . Hypertension   . LBP (low back pain)    foraminal narrowing at L4-5 and disc bulge at L5- S1 on MRI 6-08  . Renal insufficiency   . Urinary incontinence     Patient Active Problem List   Diagnosis Date Noted  . BRBPR (bright red blood per rectum) 06/04/2018  . Anemia associated with acute blood loss 06/04/2018  . Lumbar radiculopathy 12/11/2017  . Greater trochanteric bursitis of right hip 11/07/2017  . Gastroesophageal reflux disease without esophagitis 07/06/2015  . Routine general medical examination at a health care facility 07/06/2015  . Constipation, chronic 04/24/2013  . Morbid obesity ([place]) 03/22/2013  . 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 Bilateral renal cysts 09/07/2011  . Hyperlipidemia with target LDL less than 100 12/30/2009  . DM (diabetes mellitus), type 2 with renal complications ([place]) 95/63/8756  . GOUT 08/25/2008  . Essential hypertension 08/25/2008  . Chronic renal insufficiency, stage III (moderate) ([place]) 08/25/2008  . OAB (overactive bladder) 08/25/2008     History reviewed. No pertinent surgical history.   OB History    Gravida  0   Para  0   Term  0   Preterm  0   AB  0   Living  0     SAB  0   TAB  0   Ectopic  0   Multiple  0   Live Births               Home Medications    Prior to Admission medications   Medication Sig Start Date End Date Taking? Authorizing Provider  allopurinol (ZYLOPRIM) 300 MG tablet TAKE 1/2 TABLET BY MOUTH DAILY Patient taking differently: Take 150 mg by mouth daily.  11/29/17   [name], MD  Blood Glucose Monitoring Suppl (BAYER CONTOUR MONITOR) W/DEVICE KIT 1 Act by Does not apply route 2 (two) times daily. 05/06/11   [name], MD  BYSTOLIC 5 MG tablet TAKE 1 TABLET BY MOUTH DAILY Patient taking differently: Take 5 mg by mouth daily.  05/07/18   [name], MD  cholecalciferol (VITAMIN D) 1000 units tablet Take 2,000 Units by mouth daily.    [provider]  clonazePAM (KLONOPIN) 0.5 MG tablet TAKE 1 TABLET BY MOUTH TWICE A DAY Patient taking differently: Take 0.5 mg by mouth 2 (two) times daily.  10/24/17   [name], MD  fluticasone (FLONASE) 50 MCG/ACT nasal spray Place 2  sprays into both nostrils daily. 04/04/18   Marrian Salvage, FNP  gabapentin (NEURONTIN) 100 MG capsule Take 2 capsules (200 mg total) by mouth at bedtime. 10/16/17   Lyndal Pulley, DO  glucose blood (BAYER CONTOUR TEST) test strip Use BID 05/06/11   [name], MD  Linaclotide [facility]) 145 MCG CAPS capsule Take 1 capsule (145 mcg total) by mouth daily. 04/24/13   [name], MD  lisinopril-hydrochlorothiazide (PRINZIDE,ZESTORETIC) 20-25 MG tablet TAKE 1 TABLET BY MOUTH DAILY. Patient taking differently: Take 1 tablet by mouth daily.  11/29/17   [name], MD  Multiple Vitamins-Minerals (CENTRUM SILVER PO) Take 1 tablet by mouth daily.      [provider]  omeprazole (PRILOSEC) 40 MG capsule Take 1 capsule (40 mg total) by mouth daily. 06/05/18   [name], MD    Family History Family History  Problem Relation Age of Onset  . Diabetes Other   . Hypertension Other     Social History Social History   Tobacco Use  . Smoking status: Never Smoker  . Smokeless tobacco: Never Used  Substance Use Topics  . Alcohol use: No  . Drug use: No     Allergies   Crestor [rosuvastatin] and Metformin   Review of Systems Review of Systems  Constitutional: Negative for chills and fever.  HENT: Negative for ear pain and sore throat.   Eyes: Negative for pain and visual disturbance.  Respiratory: Negative for cough and shortness of breath.   Cardiovascular: Negative for chest pain and palpitations.  Gastrointestinal: Positive for blood in stool and hematochezia. Negative for abdominal distention, abdominal pain, anal bleeding, constipation, diarrhea, nausea, rectal pain and vomiting.  Genitourinary: Negative for dysuria and hematuria.  Musculoskeletal: Negative for arthralgias and back pain.  Skin: Negative for color change and rash.  Neurological: Positive for dizziness and light-headedness. Negative for seizures and syncope.  All other systems reviewed and are negative.    Physical Exam Updated Vital Signs  ED Triage Vitals  Enc Vitals Group     BP 06/07/18 1201 (!) 155/69     Pulse Rate 06/07/18 1201 (!) 116     Resp 06/07/18 1201 14     Temp 06/07/18 1201 97.7 F (36.5 C)     Temp Source 06/07/18 1201 Oral     SpO2 06/07/18 1254 100 %     Weight 06/07/18 1201 188 lb 3 oz (85.4 kg)     Height 06/07/18 1201 _0  (1.676 m)     Head Circumference --      Peak Flow --      Pain Score 06/07/18 1207 0     Pain Loc --      Pain Edu? --      Excl. in [place]? --     Physical Exam Vitals signs and nursing note reviewed.  Constitutional:      General: She is not in acute distress.    Appearance: She is well-developed.  HENT:     Head: Normocephalic and atraumatic.     Nose: Nose normal.     Mouth/Throat:     Mouth:  Mucous membranes are moist.  Eyes:     Extraocular Movements: Extraocular movements intact.     Conjunctiva/sclera: Conjunctivae normal.     Pupils: Pupils are equal, round, and reactive to light.  Neck:     Musculoskeletal: Normal range of motion and neck supple.  Cardiovascular:     Rate and Rhythm:  Regular rhythm. Tachycardia present.     Pulses: Normal pulses.     Heart sounds: Normal heart sounds. No murmur.  Pulmonary:     Effort: Pulmonary effort is normal. No respiratory distress.     Breath sounds: Normal breath sounds.  Abdominal:     General: There is no distension.     Palpations: Abdomen is soft.     Tenderness: There is no abdominal tenderness.  Genitourinary:    Rectum: Guaiac result positive (grossly melanotic stool ).  Skin:    General: Skin is warm and dry.     Capillary Refill: Capillary refill takes less than 2 seconds.  Neurological:     General: No focal deficit present.     Mental Status: She is alert.  Psychiatric:        Mood and Affect: Mood normal.      ED Treatments / Results  Labs (all labs ordered are listed, but only abnormal results are displayed) Labs Reviewed  COMPREHENSIVE METABOLIC PANEL - Abnormal; Notable for the following components:      Result Value   Potassium 3.4 (*)    Glucose, Bld 142 (*)    BUN 51 (*)    Creatinine, Ser 3.48 (*)    Total Protein 6.2 (*)    Albumin 3.3 (*)    GFR calc non Af Amer 11 (*)    GFR calc Af Amer 13 (*)    All other components within normal limits  CBC WITH DIFFERENTIAL/PLATELET - Abnormal; Notable for the following components:   RBC 2.07 (*)    Hemoglobin 6.6 (*)    HCT 22.9 (*)    MCV 110.6 (*)    MCHC 28.8 (*)    All other components within normal limits  POC OCCULT BLOOD, ED - Abnormal; Notable for the following components:   Fecal Occult Bld POSITIVE (*)    All other components within normal limits  TYPE AND SCREEN  PREPARE RBC (CROSSMATCH)    EKG EKG  Interpretation  Date/Time:  Thursday June 07 2018 12:46:18 EDT Ventricular Rate:  79 PR Interval:    QRS Duration: 128 QT Interval:  437 QTC Calculation: 501 R Axis:   42 Text Interpretation:  Normal sinus rhythm Nonspecific intraventricular conduction delay Minimal ST depression, lateral leads Confirmed by Lennice Sites [phone]) on 06/07/2018 1:14:54 PM   Radiology No results found.  Procedures .Critical Care Performed by: Lennice Sites, DO Authorized by: Lennice Sites, DO   Critical care provider statement:    Critical care time (minutes):  35   Critical care was necessary to treat or prevent imminent or life-threatening deterioration of the following conditions:  Circulatory failure   Critical care was time spent personally by me on the following activities:  Blood draw for specimens, development of treatment plan with patient or surrogate, discussions with consultants, discussions with primary provider, evaluation of patient's response to treatment, obtaining history from patient or surrogate, ordering and review of laboratory studies, ordering and review of radiographic studies, ordering and performing treatments and interventions, pulse oximetry, re-evaluation of patient's condition and review of old charts   I assumed direction of critical care for this patient from another provider in my specialty: no     (including critical care time)  Medications Ordered in ED Medications  0.9 %  sodium chloride infusion (has no administration in time range)  pantoprazole (PROTONIX) injection 40 mg (40 mg Intravenous Given 06/07/18 1255)     Initial Impression / Assessment and Plan /  ED Course  I have reviewed the triage vital signs and the nursing notes.  Pertinent labs & imaging results that were available during my care of the patient were reviewed by me and considered in my medical decision making (see chart for details).     Danielle Frazier is an 85 year old female history of  diabetes, hypertension, high cholesterol who presents to the ED from primary care doctor's office with melena, hemoglobin of 6.9.  Patient tachycardic upon arrival.  Otherwise unremarkable vitals.  Patient has noticed some red stools about 2 days ago but now has had black and tarry stools for the last 2 to 3 days.  Hemoglobin has down trended over the last several days.  Primary care doctor has been getting daily hemoglobins and has been downtrending from 9 to 8 to 6.9.  Patient has grossly melanotic stools on exam.  Patient to be given 1 unit of packed red blood cells given concern for acute GI bleed.  Patient states that she took several days worth of Aleve last week for chronic right hip pain.  No alcohol use.  No history of GI bleed in the past or ulcers in the past.  Patient has no tenderness on abdominal exam.  Will repeat blood work.  Will consult GI and admit to the hospitalist.  Patient given IV Protonix as well.  Patient with hemoglobin of 6.6.  Creatinine at baseline.  Otherwise no significant electrolyte abnormality.  Talked with [facility] on the phone and they will come evaluate the patient.  Patient given 1 unit of packed red blood cells.  Remained hemodynamically stable throughout my care.  Admitted to hospitalist service for GI bleed.  This chart was dictated using voice recognition software.  Despite best efforts to proofread,  errors can occur which can change the documentation meaning.    Final Clinical Impressions(s) / ED Diagnoses   Final diagnoses:  Acute GI bleeding    ED Discharge Orders    None       Lennice Sites, DO 06/07/18 1346

## 2018-06-07 NOTE — ED Triage Notes (Signed)
Pt had rectal bleeding since Saturday. Has been to PCP 3 times this week for blood work and everyday Hgb gotten lower. Today 6.9 so was sent to Ed for transfusion. Denies pain or taking blood thinners.

## 2018-06-07 NOTE — ED Notes (Signed)
Per [facility], states steady decrease in her Hgb, 9.0-8.0 ow 6.9-sending for possible GI bleed

## 2018-06-08 ENCOUNTER — Encounter (HOSPITAL_COMMUNITY): Payer: Self-pay | Admitting: *Deleted

## 2018-06-08 ENCOUNTER — Inpatient Hospital Stay (HOSPITAL_COMMUNITY): Payer: Medicare Other | Admitting: Anesthesiology

## 2018-06-08 ENCOUNTER — Encounter (HOSPITAL_COMMUNITY)
Admission: EM | Disposition: A | Discharge status: Home / Self Care | Payer: Self-pay | Source: Home / Self Care | Attending: Internal Medicine

## 2018-06-08 ENCOUNTER — Other Ambulatory Visit: Payer: Self-pay

## 2018-06-08 DIAGNOSIS — K921 Melena: Principal | ICD-10-CM

## 2018-06-08 DIAGNOSIS — K222 Esophageal obstruction: Secondary | ICD-10-CM

## 2018-06-08 HISTORY — PX: ESOPHAGOGASTRODUODENOSCOPY (EGD) WITH PROPOFOL: SHX5813

## 2018-06-08 LAB — BASIC METABOLIC PANEL
Anion gap: 9 (ref 5–15)
BUN: 47 mg/dL — ABNORMAL HIGH (ref 8–23)
CO2: 22 mmol/L (ref 22–32)
Calcium: 9.6 mg/dL (ref 8.9–10.3)
Chloride: 109 mmol/L (ref 98–111)
Creatinine, Ser: 3.32 mg/dL — ABNORMAL HIGH (ref 0.44–1.00)
GFR calc non Af Amer: 12 mL/min — ABNORMAL LOW (ref >60)
GFR, EST AFRICAN AMERICAN: 14 mL/min — AB (ref >60)
Glucose, Bld: 103 mg/dL — ABNORMAL HIGH (ref 70–99)
Potassium: 4 mmol/L (ref 3.5–5.1)
Sodium: 140 mmol/L (ref 135–145)

## 2018-06-08 LAB — TYPE AND SCREEN
ABO/RH(D): B POS
Antibody Screen: NEGATIVE
Unit division: 0

## 2018-06-08 LAB — BPAM RBC
Blood Product Expiration Date: 202004082359
ISSUE DATE / TIME: 202003262112
Unit Type and Rh: 7300

## 2018-06-08 LAB — CBC
HCT: 23.3 % — ABNORMAL LOW (ref 36.0–46.0)
Hemoglobin: 7.4 g/dL — ABNORMAL LOW (ref 12.0–15.0)
MCH: 31.5 pg (ref 26.0–34.0)
MCHC: 31.8 g/dL (ref 30.0–36.0)
MCV: 99.1 fL (ref 80.0–100.0)
NRBC: 0 % (ref 0.0–0.2)
Platelets: 182 10*3/uL (ref 150–400)
RBC: 2.35 MIL/uL — ABNORMAL LOW (ref 3.87–5.11)
RDW: 18.3 % — ABNORMAL HIGH (ref 11.5–15.5)
WBC: 7 10*3/uL (ref 4.0–10.5)

## 2018-06-08 LAB — ABO/RH: ABO/RH(D): B POS

## 2018-06-08 SURGERY — ESOPHAGOGASTRODUODENOSCOPY (EGD) WITH PROPOFOL
Anesthesia: Monitor Anesthesia Care

## 2018-06-08 MED ORDER — METRONIDAZOLE 500 MG PO TABS: 500.0000 mg | ORAL_TABLET | Freq: Two times a day (BID) | ORAL | Status: DC

## 2018-06-08 MED ORDER — PROPOFOL 500 MG/50ML IV EMUL
INTRAVENOUS | Status: DC | PRN
  Administered 2018-06-08: 50 ug/kg/min via INTRAVENOUS

## 2018-06-08 MED ORDER — ACETAMINOPHEN 325 MG PO TABS: 650.0000 mg | ORAL_TABLET | Freq: Four times a day (QID) | ORAL | 1 refills | Status: AC | PRN

## 2018-06-08 MED ORDER — LIP MEDEX EX OINT: 1.0000 "application " | TOPICAL_OINTMENT | CUTANEOUS | 0 refills | Status: DC | PRN

## 2018-06-08 MED ORDER — ONDANSETRON HCL 4 MG PO TABS: 4.0000 mg | ORAL_TABLET | Freq: Four times a day (QID) | ORAL | 0 refills | Status: DC | PRN

## 2018-06-08 MED ORDER — SODIUM CHLORIDE 0.9 % IV SOLN: INTRAVENOUS | Status: DC

## 2018-06-08 MED ORDER — CIPROFLOXACIN HCL 500 MG PO TABS: 250.0000 mg | ORAL_TABLET | Freq: Every day | ORAL | Status: DC

## 2018-06-08 MED ORDER — CIPROFLOXACIN HCL 250 MG PO TABS: 250.0000 mg | ORAL_TABLET | Freq: Every day | ORAL | 0 refills | Status: AC

## 2018-06-08 MED ORDER — VITAMIN D 25 MCG (1000 UNIT) PO TABS: 2000.0000 [IU] | ORAL_TABLET | Freq: Every day | ORAL | Status: DC

## 2018-06-08 MED ORDER — METRONIDAZOLE 500 MG PO TABS: 500.0000 mg | ORAL_TABLET | Freq: Two times a day (BID) | ORAL | 0 refills | Status: AC

## 2018-06-08 MED ORDER — PROPOFOL 10 MG/ML IV BOLUS
INTRAVENOUS | Status: DC | PRN
  Administered 2018-06-08 (×2): 20 mg via INTRAVENOUS

## 2018-06-08 MED ORDER — PROPOFOL 10 MG/ML IV BOLUS
INTRAVENOUS | Status: AC
  Filled 2018-06-08: qty 20

## 2018-06-08 MED ORDER — ACETAMINOPHEN 650 MG RE SUPP: 650.0000 mg | Freq: Four times a day (QID) | RECTAL | 0 refills | Status: DC | PRN

## 2018-06-08 SURGICAL SUPPLY — 15 items

## 2018-06-08 NOTE — Plan of Care (Signed)
  Problem: Education: Goal: Knowledge of General Education information will improve Description Including pain rating scale, medication(s)/side effects and non-pharmacologic comfort measures Outcome: Progressing   Problem: Clinical Measurements: Goal: Ability to maintain clinical measurements within normal limits will improve Outcome: Progressing Goal: Will remain free from infection Outcome: Progressing Goal: Diagnostic test results will improve Outcome: Progressing Goal: Cardiovascular complication will be avoided Outcome: Progressing   Problem: Activity: Goal: Risk for activity intolerance will decrease Outcome: Progressing   Problem: Nutrition: Goal: Adequate nutrition will be maintained Outcome: Progressing   Problem: Coping: Goal: Level of anxiety will decrease Outcome: Progressing   Problem: Elimination: Goal: Will not experience complications related to bowel motility Outcome: Progressing Goal: Will not experience complications related to urinary retention Outcome: Progressing   Problem: Safety: Goal: Ability to remain free from injury will improve Outcome: Progressing   Problem: Skin Integrity: Goal: Risk for impaired skin integrity will decrease Outcome: Progressing

## 2018-06-08 NOTE — Anesthesia Postprocedure Evaluation (Signed)
Anesthesia Post Note  Patient: PEITYN PAYTON  Procedure(s) Performed: ESOPHAGOGASTRODUODENOSCOPY (EGD) WITH PROPOFOL (N/A )     Patient location during evaluation: Endoscopy Anesthesia Type: MAC Level of consciousness: awake Pain management: pain level controlled Vital Signs Assessment: post-procedure vital signs reviewed and stable Respiratory status: spontaneous breathing Cardiovascular status: blood pressure returned to baseline Postop Assessment: no apparent nausea or vomiting Anesthetic complications: no    Last Vitals:  Vitals:   06/08/18 1145 06/08/18 1155  BP: (!) 160/51 (!) 131/101  Pulse: 82 92  Resp:  (!) 23  Temp:    SpO2: 100% 98%    Last Pain:  Vitals:   06/08/18 1155  TempSrc:   PainSc: 0-No pain                 Glynis Hunsucker

## 2018-06-08 NOTE — Progress Notes (Signed)
Physician Discharge Summary  Danielle Frazier UDJ:[id number] DOB: [birth year] DOA: 06/07/2018  PCP: [name], MD  Admit date: 06/07/2018 Discharge date: 06/08/2018  Admitted From: Home Discharge disposition: Home   Code Status: DNR   Recommendations for Outpatient Follow-Up:   1. Follow-up with GI as an outpatient 2. Continue to take Protonix daily.  Discharge Diagnosis:   Active Problems:   DM (diabetes mellitus), type 2 with renal complications (HCC)   Essential hypertension   CKD (chronic kidney disease) stage 5, GFR less than 15 ml/min (HCC)   Anemia associated with acute blood loss    History of Present Illness / Brief narrative:  Danielle Frazier is a 85 y.o. female with medical history significant of CKD; HTN; HLD; and DM presenting with rectal bleeding.  She noticed bloody stools, started Saturday and Sunday.  She called her PCP Monday and he saw her and recommended a transfusion.  She returned Tuesday and her hemoglobin was dropping and he suggested she return today.  She came back today and it was even lower and he sent her to the ER.  She is still having bloody stools that are very dark.  Initially it was bright red and then would get darker, straight blood.  Today was the first actual stool she has had and it was black.  She has not had any abdominal pain.  She has not had nausea.  She does not have an appetite.  She has been feeling light-headed/dizzy since Tuesday.  No fevers.  She has not previously had bleeding.  She took Aleve last week, maybe 6 over a 3-day period, due to hip pain.    ED Course:  Melena, decreasing Hgb x 3 days.  Dizzy, light-headed, Hgb 6.6.  [facility] consulted.  Took Aleve last week for R hip pain.  No h/o GI bleed.  Hospital Course:  GI bleeding Patient underwent EGD this morning.  Findings and recommendations as below: Findings:     - Non-obstructing Schatzki ring.                           - Small hiatal hernia.                            - Normal stomach.                           - Normal examined duodenum.                           - The examination was otherwise normal.                           - No specimens collected.                           - No evidence for active or recent bleeding.  Recommendation:               -Continue pantoprazole to po form - 40 mg daily.                           - Clear liquid diet. Slow advance as tolerated.                           -  Continue serial hgb/hct with transfusion as                            indicated.  Acute blood loss anemia - Hemoglobin was 6.6 on presentation.  Given current blood shortages associated with COVID pandemic, only 1 unit of PRBC was transfused with improvement up to 7.4 this morning.  Mild descending colon diverticulitis -CT scan of abdomen and pelvis obtained yesterday showed mild pericolonic stranding and edema in the distal descending colon adjacent to multiple diverticula suspicious for uncomplicated acute Diverticulitis.  Per GI recommendation today, patient will be continued on Cipro and Flagyl for next 10 days.  Afib, new -Patient denies prior h/o afib but on admission, she was noted to have an irregularly irregular rhythm on auscultation and afib on telemetry; review of her EKG appears to concur with diagnosis of afib.  Rate controlled at this time. -This may be associated with her anemia -She is rate controlled; Bystolic will be continued -She is not currently a candidate for anticoagulation given her acute bleeding; this has been discussed with the patient  CKD -Her GFR in 10/19 was 15 and it was 17 in 4/19; the patient appears to have progressed to stage 5 CKD -She is unlikely to be a dialysis candidate and so this may become a terminal condition -For now, it is likely mildly exacerbated by acute volume loss in the setting of GI bleeding -patient will follow up with her PCP -Lisinopril/HCTZ has been discontinued  HTN -Continue  Bystolic -Lisinopril-HCTZ has been discontinued  DM -Recent A1c was 6.0 on 3/23 -She does not appear to be taking medications for this issue -Given the unlikely nature of long-term sequelae of uncontrolled long-term hyperglycemia in this patient, this is likely little utility in continuing to monitor this issue over time.  Stable for discharge to home.  Patient lives with her husband.  She states that she has enough support at home.  Medical Consultants:    GI   Subjective:  Seen and examined this morning.  Seen again after endoscopy.  Patient feels better and wants to go home.  Discharge Exam:   Vitals:   06/08/18 1045 06/08/18 1133 06/08/18 1145 06/08/18 1155  BP: (!) 213/82 (!) 160/66 (!) 160/51 (!) 131/101  Pulse:  77 82 92  Resp: (!) 23 20  (!) 23  Temp: 97.9 F (36.6 C) 97.8 F (36.6 C)    TempSrc: Oral Oral    SpO2: 98% 98% 100% 98%  Weight: 85.4 kg     Height: '5\' 6"'  (1.676 m)       Body mass index is 30.37 kg/m.  General exam: Appears calm and comfortable.  Not in distress Skin: No rashes, lesions or ulcers. HEENT: Normal Lungs: Clear to auscultation bilaterally CVS: Regular rate and rhythm, no murmur GI/Abd soft, nontender, nondistended, bowel sound present CNS: Alert, awake oriented x3 Psychiatry: Mood & affect appropriate.  Extremities: No pedal edema, no calf tenderness  Discharge Instructions:  Wound care: None Discharge Instructions    Diet - low sodium heart healthy   Complete by:  As directed    Increase activity slowly   Complete by:  As directed       Allergies as of 06/08/2018      Reactions   Crestor [rosuvastatin]    Muscle aches   Metformin    REACTION: renal damage      Medication List    TAKE these medications  acetaminophen 325 MG tablet Commonly known as:  TYLENOL Take 2 tablets (650 mg total) by mouth every 6 (six) hours as needed for up to 10 days for mild pain (or Fever >/= 101).   acetaminophen 650 MG  suppository Commonly known as:  TYLENOL Place 1 suppository (650 mg total) rectally every 6 (six) hours as needed for mild pain (or Fever >/= 101).   allopurinol 300 MG tablet Commonly known as:  ZYLOPRIM TAKE 1/2 TABLET BY MOUTH DAILY   Bayer Contour Monitor w/Device Kit 1 Act by Does not apply route 2 (two) times daily.   Bystolic 5 MG tablet Generic drug:  nebivolol TAKE 1 TABLET BY MOUTH DAILY What changed:  how much to take   CENTRUM SILVER PO Take 1 tablet by mouth daily.   ciprofloxacin 250 MG tablet Commonly known as:  CIPRO Take 1 tablet (250 mg total) by mouth daily with breakfast for 10 days.   fluticasone 50 MCG/ACT nasal spray Commonly known as:  FLONASE Place 2 sprays into both nostrils daily. What changed:  when to take this   glucose blood test strip Commonly known as:  Estate manager/land agent Use BID   linaclotide 145 MCG Caps capsule Commonly known as:  Linzess Take 1 capsule (145 mcg total) by mouth daily. What changed:    when to take this  reasons to take this   lip balm ointment Apply 1 application topically as needed for lip care (cold sores).   metroNIDAZOLE 500 MG tablet Commonly known as:  FLAGYL Take 1 tablet (500 mg total) by mouth every 12 (twelve) hours for 10 days.   omeprazole 40 MG capsule Commonly known as:  PRILOSEC Take 1 capsule (40 mg total) by mouth daily.   ondansetron 4 MG tablet Commonly known as:  ZOFRAN Take 1 tablet (4 mg total) by mouth every 6 (six) hours as needed for nausea.   Vitamin D 50 MCG (2000 UT) tablet Take 2,000 Units by mouth daily.       Time coordinating discharge: 25 minutes  The results of significant diagnostics from this hospitalization (including imaging, microbiology, ancillary and laboratory) are listed below for reference.    Procedures and Diagnostic Studies:   Ct Abdomen Pelvis Wo Contrast  Result Date: 06/07/2018 CLINICAL DATA:  Rectal bleeding. Patient denies abdominal pain. EXAM:  CT ABDOMEN AND PELVIS WITHOUT CONTRAST TECHNIQUE: Multidetector CT imaging of the abdomen and pelvis was performed following the standard protocol without IV contrast. COMPARISON:  None. FINDINGS: Lower chest: Small right pleural effusion and adjacent compressive atelectasis. Trace left pleural thickening and atelectasis. Hepatobiliary: Calcified granuloma. No focal mass on noncontrast exam. Gallbladder physiologically distended, no calcified stone. No biliary dilatation. Pancreas: No ductal dilatation or inflammation. Spleen: Normal in size without focal abnormality. Adrenals/Urinary Tract: Mild bilateral adrenal thickening without dominant nodule. Bilateral renal parenchymal thinning. No hydronephrosis. There are bilateral renal cysts, largest in the lower left kidney measuring 6 cm cranial caudal. No perinephric edema. Urinary bladder is physiologically distended and unremarkable. Stomach/Bowel: Administered enteric contrast reaches the rectum. Small hiatal hernia. Stomach physiologically distended without gastric wall thickening. No small bowel obstruction or inflammation. Appendix not confidently visualized. Cecum is high-riding in the right mid-upper abdomen. Colonic diverticulosis involving the descending and sigmoid colon. Sigmoid diverticulosis is prominent. Mild pericolonic edema in the distal descending colon, image 53 series 2 in the region of multiple diverticula. Area of questionable soft tissue density the mid sigmoid colon, image 64 series 2, is felt to represent  the left ovary immediately adjacent to the colon. Vascular/Lymphatic: Mild to moderate aortic atherosclerosis. No aortic aneurysm. 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 No abdominopelvic adenopathy. Probable calcified pericaval nodes adjacent to the upper IVC. Reproductive: Uterus and bilateral adnexa are unremarkable. Other: Fat in both inguinal canals, right greater than left. No free air, free fluid, or intra-abdominal  fluid collection. Musculoskeletal: Degenerative change in the lumbar spine. Bones appear under mineralized. There is trabecular coarsening with mixed lytic and lucent areas involving the right iliac bone, sacrum, and hemipelvis, unchanged from lumbar spine CT 01/23/2018. IMPRESSION: 1. Colonic diverticulosis, prominent in the sigmoid colon. Mild pericolonic stranding and edema in the distal descending colon adjacent to multiple diverticula suspicious for uncomplicated acute diverticulitis. 2. No other explanation for GI bleed. 3. Bilateral renal parenchymal thinning and renal cysts. 4. Trabecular coarsening throughout the right hemipelvis, pattern most typical for Paget's disease. Aortic Atherosclerosis (ICD10-I70.0). Electronically Signed   By: Keith Rake M.D.   On: 06/07/2018 20:14     Labs:   Basic Metabolic Panel: Recent Labs  Lab 06/04/18 1128 06/07/18 1258 06/08/18 0408  NA 143 142 140  K 3.9 3.4* 4.0  CL 105 104 109  CO2 '26 25 22  ' GLUCOSE 136* 142* 103*  BUN 53* 51* 47*  CREATININE 3.58* 3.48* 3.32*  CALCIUM 9.8 9.7 9.6   GFR Estimated Creatinine Clearance: 13.6 mL/min (A) (by C-G formula based on SCr of 3.32 mg/dL (H)). Liver Function Tests: Recent Labs  Lab 06/07/18 1258  AST 20  ALT 13  ALKPHOS 123  BILITOT 0.4  PROT 6.2*  ALBUMIN 3.3*   No results for input(s): LIPASE, AMYLASE in the last 168 hours. No results for input(s): AMMONIA in the last 168 hours. Coagulation profile Recent Labs  Lab 06/05/18 1026  INR 1.1*    CBC: Recent Labs  Lab 06/04/18 1128 06/05/18 1026 06/07/18 1124 06/07/18 1258 06/08/18 0408  WBC 10.2 8.7 7.5 7.2 7.0  NEUTROABS 7.4 5.7 5.4 5.3  --   HGB 9.0* 8.0 Repeated and verified X2.* 6.9 Repeated and verified X2.* 6.6* 7.4*  HCT 27.3* 24.2* 20.9 Repeated and verified X2.* 22.9* 23.3*  MCV 100.4* 100.9* 100.5* 110.6* 99.1  PLT 244.0 216.0 256.0 270 182   Cardiac Enzymes: No results for input(s): CKTOTAL, CKMB, CKMBINDEX,  TROPONINI in the last 168 hours. BNP: Invalid input(s): POCBNP CBG: No results for input(s): GLUCAP in the last 168 hours. D-Dimer No results for input(s): DDIMER in the last 72 hours. Hgb A1c No results for input(s): HGBA1C in the last 72 hours. Lipid Profile No results for input(s): CHOL, HDL, LDLCALC, TRIG, CHOLHDL, LDLDIRECT in the last 72 hours. Thyroid function studies No results for input(s): TSH, T4TOTAL, T3FREE, THYROIDAB in the last 72 hours.  Invalid input(s): FREET3 Anemia work up Recent Labs    06/07/18 1124  RETICCTPCT 3.8   Microbiology No results found for this or any previous visit (from the past 240 hour(s)).  Signed: Terrilee Croak  [facility] 06/08/2018, 12:57 PM

## 2018-06-08 NOTE — Interval H&P Note (Signed)
History and Physical Interval Note:  06/08/2018 11:29 AM  Danielle Frazier  has presented today for surgery, with the diagnosis of Melena, GI bleed.  The various methods of treatment have been discussed with the patient and family. After consideration of risks, benefits and other options for treatment, the patient has consented to  Procedure(s): ESOPHAGOGASTRODUODENOSCOPY (EGD) WITH PROPOFOL (N/A) as a surgical intervention.  The patient's history has been reviewed, patient examined, no change in status, stable for surgery.  I have reviewed the patient's chart and labs.  Questions were answered to the patient's satisfaction.     [name]

## 2018-06-08 NOTE — Progress Notes (Signed)
Patient ID: Danielle Frazier, female   DOB: 07-02-1930, 85 y.o.   MRN: [id number]  BRIEF GI UPDATE NOTE: Patient for EGD at 11am with Dr. Tarri Glenn. No rectal bleeding or melena since admission. Patient received 1 unit PRBCs. Post transfusion Hg 7.4 >> 6.6. BUN 47 << 51. Patient had a little nausea after drinking CT contrast yesterday evening. No vomiting. A/P CT with oral contrast only showed colonic diverticulosis, prominent in the sigmoid colon, mild pericolonic stranding and edema in the distal descending colon adjacent to multiple diverticula suspicious for uncomplicated acute diverticulitis. Patient denies having any abdominal pain. No further bloody diarrhea. Brief exam: lungs clear, heart rhythm slightly irregular, no murmurs, abdomen soft, nontender. No masses.Afebrile. WBC 7.0. Patient to proceed with EGD today at 11am. To consider outpatient colonoscopy. If patient develops lower abdominal pain will tx with Cipro/Flagyl.

## 2018-06-08 NOTE — Plan of Care (Signed)
  Problem: Education: Goal: Knowledge of General Education information will improve Description Including pain rating scale, medication(s)/side effects and non-pharmacologic comfort measures 06/08/2018 1315 by Phyllis Ginger, RN Outcome: Adequate for Discharge 06/08/2018 1251 by Phyllis Ginger, RN Outcome: Progressing   Problem: Health Behavior/Discharge Planning: Goal: Ability to manage health-related needs will improve Outcome: Adequate for Discharge   Problem: Clinical Measurements: Goal: Ability to maintain clinical measurements within normal limits will improve 06/08/2018 1315 by Phyllis Ginger, RN Outcome: Adequate for Discharge 06/08/2018 1251 by Phyllis Ginger, RN Outcome: Progressing Goal: Will remain free from infection 06/08/2018 1315 by Phyllis Ginger, RN Outcome: Adequate for Discharge 06/08/2018 1251 by Phyllis Ginger, RN Outcome: Progressing Goal: Diagnostic test results will improve 06/08/2018 1315 by Phyllis Ginger, RN Outcome: Adequate for Discharge 06/08/2018 1251 by Phyllis Ginger, RN Outcome: Progressing Goal: Respiratory complications will improve Outcome: Adequate for Discharge Goal: Cardiovascular complication will be avoided 06/08/2018 1315 by Phyllis Ginger, RN Outcome: Adequate for Discharge 06/08/2018 1251 by Phyllis Ginger, RN Outcome: Progressing   Problem: Activity: Goal: Risk for activity intolerance will decrease 06/08/2018 1315 by Phyllis Ginger, RN Outcome: Adequate for Discharge 06/08/2018 1251 by Phyllis Ginger, RN Outcome: Progressing   Problem: Nutrition: Goal: Adequate nutrition will be maintained 06/08/2018 1315 by Phyllis Ginger, RN Outcome: Adequate for Discharge 06/08/2018 1251 by Phyllis Ginger, RN Outcome: Progressing   Problem: Coping: Goal: Level of anxiety will decrease 06/08/2018 1315 by Phyllis Ginger, RN Outcome: Adequate for Discharge 06/08/2018 1251 by Phyllis Ginger, RN Outcome: Progressing   Problem:  Elimination: Goal: Will not experience complications related to bowel motility 06/08/2018 1315 by Phyllis Ginger, RN Outcome: Adequate for Discharge 06/08/2018 1251 by Phyllis Ginger, RN Outcome: Progressing Goal: Will not experience complications related to urinary retention 06/08/2018 1315 by Phyllis Ginger, RN Outcome: Adequate for Discharge 06/08/2018 1251 by Phyllis Ginger, RN Outcome: Progressing   Problem: Pain Managment: Goal: General experience of comfort will improve Outcome: Adequate for Discharge   Problem: Safety: Goal: Ability to remain free from injury will improve 06/08/2018 1315 by Phyllis Ginger, RN Outcome: Adequate for Discharge 06/08/2018 1251 by Phyllis Ginger, RN Outcome: Progressing   Problem: Skin Integrity: Goal: Risk for impaired skin integrity will decrease 06/08/2018 1315 by Phyllis Ginger, RN Outcome: Adequate for Discharge 06/08/2018 1251 by Phyllis Ginger, RN Outcome: Progressing

## 2018-06-08 NOTE — Discharge Instructions (Signed)
Gastrointestinal Bleeding  Gastrointestinal (GI) bleeding is bleeding somewhere along the digestive tract, between the mouth and anus. This can be caused by various problems. The severity of these problems can range from mild to serious or even life-threatening. If you have GI bleeding, you may find blood in your stools (feces), you may have black stools, or you may vomit blood. If there is a lot of bleeding, you may need to stay in the hospital.  What are the causes?  This condition may be caused by:   Esophagitis. This is inflammation, irritation, or swelling of the esophagus.   Hemorrhoids.These are swollen veins in the rectum.   Anal fissures.These are areas of painful tearing that are often caused by passing hard stool.   Diverticulosis.These are pouches that form on the colon over time, with age, and may bleed a lot.   Diverticulitis.This is inflammation in areas with diverticulosis. It can cause pain, fever, and bloody stools, although bleeding may be mild.   Polyps and cancer. Colon cancer often starts out as precancerous polyps.   Gastritis and ulcers. With these, bleeding may come from the upper GI tract, near the stomach.  What are the signs or symptoms?  Symptoms of this condition may include:   Bright red blood in your vomit, or vomit that looks like coffee grounds.   Bloody, black, or tarry stools.  ? Bleeding from the lower GI tract will usually cause red or maroon blood in the stools.  ? Bleeding from the upper GI tract may cause black, tarry, often bad-smelling stools.  ? In certain cases, if the bleeding is fast enough, the stools may be red.   Pain or cramping in the abdomen.  How is this diagnosed?  This condition may be diagnosed based on:   Medical history and physical exam.   Various tests, such as:  ? Blood tests.  ? X-rays and other imaging tests.  ? Esophagogastroduodenoscopy (EGD). In this test, a flexible, lighted tube is used to look at your esophagus, stomach, and small  intestine.  ? Colonoscopy. In this test, a flexible, lighted tube is used to look at your colon.  How is this treated?  Treatment for this condition depends on the cause of the bleeding. For example:   For bleeding from the esophagus, stomach, small intestine, or colon, the health care provider doing your EGD or colonoscopy may be able to stop the bleeding as part of the procedure.   Inflammation or infection of the colon can be treated with medicines.   Certain rectal problems can be treated with creams, suppositories, or warm baths.   Surgery is sometimes needed.   Blood transfusions are sometimes needed if a lot of blood has been lost.  If bleeding is slow, you may be allowed to go home. If there is a lot of bleeding, you will need to stay in the hospital for observation.  Follow these instructions at home:     Take over-the-counter and prescription medicines only as told by your health care provider.   Eat foods that are high in fiber. This will help to keep your stools soft. These foods include whole grains, legumes, fruits, and vegetables. Eating 1-3 prunes each day works well for many people.   Drink enough fluid to keep your urine clear or pale yellow.   Keep all follow-up visits as told by your health care provider. This is important.  Contact a health care provider if:   Your symptoms do not improve.    Get help right away if:   Your bleeding increases.   You feel light-headed or you faint.   You feel weak.   You have severe cramps in your back or abdomen.   You pass large blood clots in your stool.   Your symptoms are getting worse.  This information is not intended to replace advice given to you by your health care provider. Make sure you discuss any questions you have with your health care provider.  Document Released: 02/26/2000 Document Revised: 07/29/2015 Document Reviewed: 08/18/2014  [employer]vier Interactive Patient Education  2019 [employer]

## 2018-06-08 NOTE — Op Note (Signed)
[facility] Patient Name: Danielle Frazier Procedure Date: 06/08/2018 MRN: [id number] Attending MD: [name] MD, MD Date of Birth: 08/28/1930 CSN: [id number] Age: 85 Admit Type: Inpatient Procedure:                Upper GI endoscopy Indications:              Hematochezia, Melena Providers:                [name] MD, MD, Glori Bickers, RN, Carlyn Reichert, RN, Charolette Child, Technician, Anne Fu CRNA, CRNA Referring MD:              Medicines:                See the Anesthesia note for documentation of the                            administered medications Complications:            No immediate complications. Estimated Blood Loss:     Estimated blood loss: none. Procedure:                Pre-Anesthesia Assessment:                           - Prior to the procedure, a History and Physical                            was performed, and patient medications and                            allergies were reviewed. The patient's tolerance of                            previous anesthesia was also reviewed. The risks                            and benefits of the procedure and the sedation                            options and risks were discussed with the patient.                            All questions were answered, and informed consent                            was obtained. Prior Anticoagulants: The patient has                            taken no previous anticoagulant or antiplatelet                            agents.  ASA Grade Assessment: III - A patient with                            severe systemic disease. After reviewing the risks                            and benefits, the patient was deemed in                            satisfactory condition to undergo the procedure.                           After obtaining informed consent, the endoscope was                            passed under  direct vision. Throughout the                            procedure, the patient's blood pressure, pulse, and                            oxygen saturations were monitored continuously. The                            [id number]-H190 ([id number]) Olympus gastroscope was                            introduced through the mouth, and advanced to the                            third part of duodenum. The upper GI endoscopy was                            accomplished without difficulty. The patient                            tolerated the procedure well. Scope In: Scope Out: Findings:      A non-obstructing Schatzki ring was found in the lower third of the       esophagus.      A small hiatal hernia was present.      The stomach was normal. No blood present.      The examined duodenum was normal. No blood present.      The exam was otherwise without abnormality. Impression:               - Non-obstructing Schatzki ring.                           - Small hiatal hernia.                           - Normal stomach.                           - Normal examined duodenum.                           -  The examination was otherwise normal.                           - No specimens collected.                           - No evidence for active or recent bleeding. Moderate Sedation:      Not Applicable - Patient had care per Anesthesia. Recommendation:           - Patient has a contact number available for                            emergencies. The signs and symptoms of potential                            delayed complications were discussed with the                            patient. Return to normal activities tomorrow.                            Written discharge instructions were provided to the                            patient.                           - Convert pantoprazole to po form - 40 mg daily.                           - Clear liquid diet. Slow advance as tolerated if                            her  hemoglobin remains stable.                           - Continue serial hgb/hct with transfusion as                            indicated.                           - Continue present medications - including Cipro                            and Flagyl that were started today for possible                            diverticulitis after reviewing her recent CT scan.                           - No colonoscopy planned or recommended at this                            time given the new diagnosis of suspected  diverticulitis.                           The results and recommendations were reviewed with                            the patient prior to discharge from the endoscopy                            unit. Procedure Code(s):        --- Professional ---                           [phone], Esophagogastroduodenoscopy, flexible,                            transoral; diagnostic, including collection of                            specimen(s) by brushing or washing, when performed                            (separate procedure) Diagnosis Code(s):        --- Professional ---                           K22.2, Esophageal obstruction                           K44.9, Diaphragmatic hernia without obstruction or                            gangrene                           K92.1, Melena (includes Hematochezia) CPT copyright 2019 American Medical Association. All rights reserved. The codes documented in this report are preliminary and upon coder review may  be revised to meet current compliance requirements. [name] MD, MD 06/08/2018 11:45:22 AM This report has been signed electronically. Number of Addenda: 0

## 2018-06-08 NOTE — Anesthesia Preprocedure Evaluation (Signed)
Anesthesia Evaluation  Patient identified by MRN, date of birth, ID band Patient awake    Reviewed: Allergy & Precautions, NPO status , Patient's Chart, lab work & pertinent test results  Airway Mallampati: II  TM Distance: >3 FB     Dental   Pulmonary neg pulmonary ROS,    breath sounds clear to auscultation       Cardiovascular hypertension,  Rhythm:Regular Rate:Normal     Neuro/Psych    GI/Hepatic Neg liver ROS, GERD  ,  Endo/Other  diabetes  Renal/GU Renal disease     Musculoskeletal   Abdominal   Peds  Hematology  (+) anemia ,   Anesthesia Other Findings   Reproductive/Obstetrics                             Anesthesia Physical Anesthesia Plan  ASA: III  Anesthesia Plan: MAC   Post-op Pain Management:    Induction: Intravenous  PONV Risk Score and Plan: 2 and Ondansetron and Treatment may vary due to age or medical condition  Airway Management Planned: Simple Face Mask  Additional Equipment:   Intra-op Plan:   Post-operative Plan:   Informed Consent: I have reviewed the patients History and Physical, chart, labs and discussed the procedure including the risks, benefits and alternatives for the proposed anesthesia with the patient or authorized representative who has indicated his/her understanding and acceptance.     Dental advisory given  Plan Discussed with: CRNA and Anesthesiologist  Anesthesia Plan Comments:         Anesthesia Quick Evaluation

## 2018-06-08 NOTE — Transfer of Care (Signed)
Immediate Anesthesia Transfer of Care Note  Patient: Danielle Frazier  Procedure(s) Performed: Procedure(s): ESOPHAGOGASTRODUODENOSCOPY (EGD) WITH PROPOFOL (N/A)  Patient Location: PACU  Anesthesia Type:MAC  Level of Consciousness:  sedated, patient cooperative and responds to stimulation  Airway & Oxygen Therapy:Patient Spontanous Breathing and Patient connected to face mask oxgen  Post-op Assessment:  Report given to PACU RN and Post -op Vital signs reviewed and stable  Post vital signs:  Reviewed and stable  Last Vitals:  Vitals:   06/08/18 0605 06/08/18 1045  BP: (!) 159/60 (!) 213/82  Pulse: 87   Resp: 17 (!) 23  Temp: 37.2 C 36.6 C  SpO2: 44% 62%    Complications: No apparent anesthesia complications

## 2018-06-10 NOTE — Discharge Summary (Signed)
Physician Discharge Summary  Danielle Frazier:[id number] DOB: 1930-10-13 DOA: 06/07/2018  PCP: [name], MD  Admit date: 06/07/2018 Discharge date: 06/10/2018  Admitted From: Home Discharge disposition: Home   Code Status: Prior   Recommendations for Outpatient Follow-Up:   1. Follow-up with GI as an outpatient 2. Continue to take Protonix daily.  Discharge Diagnosis:   Active Problems:   DM (diabetes mellitus), type 2 with renal complications (HCC)   Essential hypertension   CKD (chronic kidney disease) stage 5, GFR less than 15 ml/min (HCC)   Anemia associated with acute blood loss    History of Present Illness / Brief narrative:  Danielle Frazier is a 85 y.o. female with medical history significant of CKD; HTN; HLD; and DM presenting with rectal bleeding.  She noticed bloody stools, started Saturday and Sunday.  She called her PCP Monday and he saw her and recommended a transfusion.  She returned Tuesday and her hemoglobin was dropping and he suggested she return today.  She came back today and it was even lower and he sent her to the ER.  She is still having bloody stools that are very dark.  Initially it was bright red and then would get darker, straight blood.  Today was the first actual stool she has had and it was black.  She has not had any abdominal pain.  She has not had nausea.  She does not have an appetite.  She has been feeling light-headed/dizzy since Tuesday.  No fevers.  She has not previously had bleeding.  She took Aleve last week, maybe 6 over a 3-day period, due to hip pain.    ED Course:  Melena, decreasing Hgb x 3 days.  Dizzy, light-headed, Hgb 6.6.  [facility] consulted.  Took Aleve last week for R hip pain.  No h/o GI bleed.  Hospital Course:  GI bleeding Patient underwent EGD this morning.  Findings and recommendations as below: Findings:     - Non-obstructing Schatzki ring.                           - Small hiatal hernia.                            - Normal stomach.                           - Normal examined duodenum.                           - The examination was otherwise normal.                           - No specimens collected.                           - No evidence for active or recent bleeding.  Recommendation:               -Continue pantoprazole to po form - 40 mg daily.                           - Clear liquid diet. Slow advance as tolerated.                           -  Continue serial hgb/hct with transfusion as                            indicated.  Acute blood loss anemia - Hemoglobin was 6.6 on presentation.  Given current blood shortages associated with COVID pandemic, only 1 unit of PRBC was transfused with improvement up to 7.4 this morning.  Mild descending colon diverticulitis -CT scan of abdomen and pelvis obtained yesterday showed mild pericolonic stranding and edema in the distal descending colon adjacent to multiple diverticula suspicious for uncomplicated acute Diverticulitis.  Per GI recommendation today, patient will be continued on Cipro and Flagyl for next 10 days.  Afib, new -Patient denies prior h/o afib but on admission, she was noted to have an irregularly irregular rhythm on auscultation and afib on telemetry; review of her EKG appears to concur with diagnosis of afib.  Rate controlled at this time. -This may be associated with her anemia -She is rate controlled; Bystolic will be continued -She is not currently a candidate for anticoagulation given her acute bleeding; this has been discussed with the patient  CKD -Her GFR in 10/19 was 15 and it was 17 in 4/19; the patient appears to have progressed to stage 5 CKD -She is unlikely to be a dialysis candidate and so this may become a terminal condition -For now, it is likely mildly exacerbated by acute volume loss in the setting of GI bleeding -patient will follow up with her PCP -Lisinopril/HCTZ has been discontinued  HTN -Continue  Bystolic -Lisinopril-HCTZ has been discontinued  DM -Recent A1c was 6.0 on 3/23 -She does not appear to be taking medications for this issue -Given the unlikely nature of long-term sequelae of uncontrolled long-term hyperglycemia in this patient, this is likely little utility in continuing to monitor this issue over time.  Stable for discharge to home.  Patient lives with her husband.  She states that she has enough support at home.  Medical Consultants:    GI   Subjective:  Seen and examined this morning.  Seen again after endoscopy.  Patient feels better and wants to go home.  Discharge Exam:   Vitals:   06/08/18 1133 06/08/18 1145 06/08/18 1155 06/08/18 1301  BP: (!) 160/66 (!) 160/51 (!) 131/101 (!) 187/88  Pulse: 77 82 92 95  Resp: 20  (!) 23 (!) 21  Temp: 97.8 F (36.6 C)   99.1 F (37.3 C)  TempSrc: Oral   Oral  SpO2: 98% 100% 98% 96%  Weight:      Height:        Body mass index is 30.37 kg/m.  General exam: Appears calm and comfortable.  Not in distress Skin: No rashes, lesions or ulcers. HEENT: Normal Lungs: Clear to auscultation bilaterally CVS: Regular rate and rhythm, no murmur GI/Abd soft, nontender, nondistended, bowel sound present CNS: Alert, awake oriented x3 Psychiatry: Mood & affect appropriate.  Extremities: No pedal edema, no calf tenderness  Discharge Instructions:  Wound care: None Discharge Instructions    Diet - low sodium heart healthy   Complete by:  As directed    Increase activity slowly   Complete by:  As directed       Allergies as of 06/08/2018      Reactions   Crestor [rosuvastatin]    Muscle aches   Metformin    REACTION: renal damage      Medication List    TAKE these medications   acetaminophen 325  MG tablet Commonly known as:  TYLENOL Take 2 tablets (650 mg total) by mouth every 6 (six) hours as needed for up to 10 days for mild pain (or Fever >/= 101).   acetaminophen 650 MG suppository Commonly known as:   TYLENOL Place 1 suppository (650 mg total) rectally every 6 (six) hours as needed for mild pain (or Fever >/= 101).   allopurinol 300 MG tablet Commonly known as:  ZYLOPRIM TAKE 1/2 TABLET BY MOUTH DAILY   Bayer Contour Monitor w/Device Kit 1 Act by Does not apply route 2 (two) times daily.   Bystolic 5 MG tablet Generic drug:  nebivolol TAKE 1 TABLET BY MOUTH DAILY What changed:  how much to take   CENTRUM SILVER PO Take 1 tablet by mouth daily.   ciprofloxacin 250 MG tablet Commonly known as:  CIPRO Take 1 tablet (250 mg total) by mouth daily with breakfast for 10 days.   fluticasone 50 MCG/ACT nasal spray Commonly known as:  FLONASE Place 2 sprays into both nostrils daily. What changed:  when to take this   glucose blood test strip Commonly known as:  Estate manager/land agent Use BID   linaclotide 145 MCG Caps capsule Commonly known as:  Linzess Take 1 capsule (145 mcg total) by mouth daily. What changed:    when to take this  reasons to take this   lip balm ointment Apply 1 application topically as needed for lip care (cold sores).   metroNIDAZOLE 500 MG tablet Commonly known as:  FLAGYL Take 1 tablet (500 mg total) by mouth every 12 (twelve) hours for 10 days.   omeprazole 40 MG capsule Commonly known as:  PRILOSEC Take 1 capsule (40 mg total) by mouth daily.   ondansetron 4 MG tablet Commonly known as:  ZOFRAN Take 1 tablet (4 mg total) by mouth every 6 (six) hours as needed for nausea.   Vitamin D 50 MCG (2000 UT) tablet Take 2,000 Units by mouth daily.       Time coordinating discharge: 25 minutes  The results of significant diagnostics from this hospitalization (including imaging, microbiology, ancillary and laboratory) are listed below for reference.    Procedures and Diagnostic Studies:   Ct Abdomen Pelvis Wo Contrast  Result Date: 06/07/2018 CLINICAL DATA:  Rectal bleeding. Patient denies abdominal pain. EXAM: CT ABDOMEN AND PELVIS WITHOUT  CONTRAST TECHNIQUE: Multidetector CT imaging of the abdomen and pelvis was performed following the standard protocol without IV contrast. COMPARISON:  None. FINDINGS: Lower chest: Small right pleural effusion and adjacent compressive atelectasis. Trace left pleural thickening and atelectasis. Hepatobiliary: Calcified granuloma. No focal mass on noncontrast exam. Gallbladder physiologically distended, no calcified stone. No biliary dilatation. Pancreas: No ductal dilatation or inflammation. Spleen: Normal in size without focal abnormality. Adrenals/Urinary Tract: Mild bilateral adrenal thickening without dominant nodule. Bilateral renal parenchymal thinning. No hydronephrosis. There are bilateral renal cysts, largest in the lower left kidney measuring 6 cm cranial caudal. No perinephric edema. Urinary bladder is physiologically distended and unremarkable. Stomach/Bowel: Administered enteric contrast reaches the rectum. Small hiatal hernia. Stomach physiologically distended without gastric wall thickening. No small bowel obstruction or inflammation. Appendix not confidently visualized. Cecum is high-riding in the right mid-upper abdomen. Colonic diverticulosis involving the descending and sigmoid colon. Sigmoid diverticulosis is prominent. Mild pericolonic edema in the distal descending colon, image 53 series 2 in the region of multiple diverticula. Area of questionable soft tissue density the mid sigmoid colon, image 64 series 2, is felt to represent the left  ovary immediately adjacent to the colon. Vascular/Lymphatic: Mild to moderate aortic atherosclerosis. No aortic aneurysm. 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 No abdominopelvic adenopathy. Probable calcified pericaval nodes adjacent to the upper IVC. Reproductive: Uterus and bilateral adnexa are unremarkable. Other: Fat in both inguinal canals, right greater than left. No free air, free fluid, or intra-abdominal fluid collection.  Musculoskeletal: Degenerative change in the lumbar spine. Bones appear under mineralized. There is trabecular coarsening with mixed lytic and lucent areas involving the right iliac bone, sacrum, and hemipelvis, unchanged from lumbar spine CT 01/23/2018. IMPRESSION: 1. Colonic diverticulosis, prominent in the sigmoid colon. Mild pericolonic stranding and edema in the distal descending colon adjacent to multiple diverticula suspicious for uncomplicated acute diverticulitis. 2. No other explanation for GI bleed. 3. Bilateral renal parenchymal thinning and renal cysts. 4. Trabecular coarsening throughout the right hemipelvis, pattern most typical for Paget's disease. Aortic Atherosclerosis (ICD10-I70.0). Electronically Signed   By: Keith Rake M.D.   On: 06/07/2018 20:14     Labs:   Basic Metabolic Panel: Recent Labs  Lab 06/04/18 1128 06/07/18 1258 06/08/18 0408  NA 143 142 140  K 3.9 3.4* 4.0  CL 105 104 109  CO2 '26 25 22  ' GLUCOSE 136* 142* 103*  BUN 53* 51* 47*  CREATININE 3.58* 3.48* 3.32*  CALCIUM 9.8 9.7 9.6   GFR Estimated Creatinine Clearance: 13.6 mL/min (A) (by C-G formula based on SCr of 3.32 mg/dL (H)). Liver Function Tests: Recent Labs  Lab 06/07/18 1258  AST 20  ALT 13  ALKPHOS 123  BILITOT 0.4  PROT 6.2*  ALBUMIN 3.3*   No results for input(s): LIPASE, AMYLASE in the last 168 hours. No results for input(s): AMMONIA in the last 168 hours. Coagulation profile Recent Labs  Lab 06/05/18 1026  INR 1.1*    CBC: Recent Labs  Lab 06/04/18 1128 06/05/18 1026 06/07/18 1124 06/07/18 1258 06/08/18 0408  WBC 10.2 8.7 7.5 7.2 7.0  NEUTROABS 7.4 5.7 5.4 5.3  --   HGB 9.0* 8.0 Repeated and verified X2.* 6.9 Repeated and verified X2.* 6.6* 7.4*  HCT 27.3* 24.2* 20.9 Repeated and verified X2.* 22.9* 23.3*  MCV 100.4* 100.9* 100.5* 110.6* 99.1  PLT 244.0 216.0 256.0 270 182   Cardiac Enzymes: No results for input(s): CKTOTAL, CKMB, CKMBINDEX, TROPONINI in the  last 168 hours. BNP: Invalid input(s): POCBNP CBG: No results for input(s): GLUCAP in the last 168 hours. D-Dimer No results for input(s): DDIMER in the last 72 hours. Hgb A1c No results for input(s): HGBA1C in the last 72 hours. Lipid Profile No results for input(s): CHOL, HDL, LDLCALC, TRIG, CHOLHDL, LDLDIRECT in the last 72 hours. Thyroid function studies No results for input(s): TSH, T4TOTAL, T3FREE, THYROIDAB in the last 72 hours.  Invalid input(s): FREET3 Anemia work up Recent Labs    06/07/18 1124  RETICCTPCT 3.8   Microbiology No results found for this or any previous visit (from the past 240 hour(s)).  Signed: Terrilee Croak  [facility] 06/10/2018, 8:32 AM

## 2018-06-12 ENCOUNTER — Encounter (HOSPITAL_COMMUNITY): Payer: Self-pay | Admitting: Gastroenterology

## 2018-06-14 ENCOUNTER — Other Ambulatory Visit: Payer: Self-pay | Admitting: Internal Medicine

## 2018-06-14 ENCOUNTER — Other Ambulatory Visit (INDEPENDENT_AMBULATORY_CARE_PROVIDER_SITE_OTHER): Payer: Medicare Other

## 2018-06-14 ENCOUNTER — Telehealth: Payer: Self-pay

## 2018-06-14 DIAGNOSIS — D62 Acute posthemorrhagic anemia: Secondary | ICD-10-CM | POA: Diagnosis not present

## 2018-06-14 LAB — CBC WITH DIFFERENTIAL/PLATELET
Basophils Absolute: 0.1 10*3/uL (ref 0.0–0.1)
Basophils Relative: 1.2 % (ref 0.0–3.0)
Eosinophils Absolute: 0.1 10*3/uL (ref 0.0–0.7)
Eosinophils Relative: 1.9 % (ref 0.0–5.0)
HCT: 26.6 % — ABNORMAL LOW (ref 36.0–46.0)
Hemoglobin: 8.7 g/dL — ABNORMAL LOW (ref 12.0–15.0)
Lymphocytes Relative: 27.2 % (ref 12.0–46.0)
Lymphs Abs: 2 10*3/uL (ref 0.7–4.0)
MCHC: 32.7 g/dL (ref 30.0–36.0)
MCV: 98.6 fl (ref 78.0–100.0)
Monocytes Absolute: 0.6 10*3/uL (ref 0.1–1.0)
Monocytes Relative: 8.8 % (ref 3.0–12.0)
Neutro Abs: 4.4 10*3/uL (ref 1.4–7.7)
Neutrophils Relative %: 60.9 % (ref 43.0–77.0)
Platelets: 302 10*3/uL (ref 150.0–400.0)
RBC: 2.7 Mil/uL — ABNORMAL LOW (ref 3.87–5.11)
RDW: 18.6 % — ABNORMAL HIGH (ref 11.5–15.5)
WBC: 7.2 10*3/uL (ref 4.0–10.5)

## 2018-06-14 NOTE — Telephone Encounter (Signed)
Pt is on her way to the lab.

## 2018-06-14 NOTE — Telephone Encounter (Signed)
Ask her to come in and go to the lab for a stat CBC.

## 2018-06-14 NOTE — Telephone Encounter (Signed)
Pt called and stated she is having bleeding again when she has a bowl movement. She would like to know if Dr. Ronnald Ramp wants to see her again or has another recommendation.   Please advise

## 2018-07-11 ENCOUNTER — Other Ambulatory Visit (INDEPENDENT_AMBULATORY_CARE_PROVIDER_SITE_OTHER): Payer: Medicare Other

## 2018-07-11 ENCOUNTER — Ambulatory Visit (INDEPENDENT_AMBULATORY_CARE_PROVIDER_SITE_OTHER): Payer: Medicare Other | Admitting: Internal Medicine

## 2018-07-11 ENCOUNTER — Other Ambulatory Visit: Payer: Self-pay

## 2018-07-11 ENCOUNTER — Encounter: Payer: Self-pay | Admitting: Internal Medicine

## 2018-07-11 VITALS — BP 148/92 | HR 87 | Temp 97.7°F | Resp 16 | Ht 66.0 in | Wt 186.5 lb

## 2018-07-11 DIAGNOSIS — N185 Chronic kidney disease, stage 5: Secondary | ICD-10-CM

## 2018-07-11 DIAGNOSIS — I10 Essential (primary) hypertension: Secondary | ICD-10-CM | POA: Diagnosis not present

## 2018-07-11 DIAGNOSIS — E1121 Type 2 diabetes mellitus with diabetic nephropathy: Secondary | ICD-10-CM

## 2018-07-11 DIAGNOSIS — D62 Acute posthemorrhagic anemia: Secondary | ICD-10-CM

## 2018-07-11 DIAGNOSIS — G8929 Other chronic pain: Secondary | ICD-10-CM

## 2018-07-11 DIAGNOSIS — I4819 Other persistent atrial fibrillation: Secondary | ICD-10-CM

## 2018-07-11 DIAGNOSIS — K5909 Other constipation: Secondary | ICD-10-CM

## 2018-07-11 DIAGNOSIS — G4701 Insomnia due to medical condition: Secondary | ICD-10-CM

## 2018-07-11 HISTORY — DX: Other chronic pain: G89.29

## 2018-07-11 HISTORY — DX: Insomnia due to medical condition: G47.01

## 2018-07-11 LAB — CBC WITH DIFFERENTIAL/PLATELET
Basophils Absolute: 0.1 10*3/uL (ref 0.0–0.1)
Basophils Relative: 0.9 % (ref 0.0–3.0)
Eosinophils Absolute: 0.1 10*3/uL (ref 0.0–0.7)
Eosinophils Relative: 1.8 % (ref 0.0–5.0)
HCT: 30.6 % — ABNORMAL LOW (ref 36.0–46.0)
Hemoglobin: 10 g/dL — ABNORMAL LOW (ref 12.0–15.0)
Lymphocytes Relative: 17.8 % (ref 12.0–46.0)
Lymphs Abs: 1 10*3/uL (ref 0.7–4.0)
MCHC: 32.7 g/dL (ref 30.0–36.0)
MCV: 97.8 fl (ref 78.0–100.0)
Monocytes Absolute: 0.5 10*3/uL (ref 0.1–1.0)
Monocytes Relative: 8 % (ref 3.0–12.0)
Neutro Abs: 4.2 10*3/uL (ref 1.4–7.7)
Neutrophils Relative %: 71.5 % (ref 43.0–77.0)
Platelets: 273 10*3/uL (ref 150.0–400.0)
RBC: 3.13 Mil/uL — ABNORMAL LOW (ref 3.87–5.11)
RDW: 16.4 % — ABNORMAL HIGH (ref 11.5–15.5)
WBC: 5.9 10*3/uL (ref 4.0–10.5)

## 2018-07-11 LAB — BASIC METABOLIC PANEL
BUN: 30 mg/dL — ABNORMAL HIGH (ref 6–23)
CO2: 25 mEq/L (ref 19–32)
Calcium: 10.1 mg/dL (ref 8.4–10.5)
Chloride: 104 mEq/L (ref 96–112)
Creatinine, Ser: 3.2 mg/dL — ABNORMAL HIGH (ref 0.40–1.20)
GFR: 13.75 mL/min — CL (ref >60.00)
Glucose, Bld: 123 mg/dL — ABNORMAL HIGH (ref 70–99)
Potassium: 3.9 mEq/L (ref 3.5–5.1)
Sodium: 141 mEq/L (ref 135–145)

## 2018-07-11 LAB — FERRITIN: Ferritin: 35.5 ng/mL (ref 10.0–291.0)

## 2018-07-11 LAB — IBC PANEL
Iron: 39 ug/dL — ABNORMAL LOW (ref 42–145)
Saturation Ratios: 9.8 % — ABNORMAL LOW (ref 20.0–50.0)
Transferrin: 284 mg/dL (ref 212.0–360.0)

## 2018-07-11 MED ORDER — FUSION PLUS PO CAPS: 1.0000 | ORAL_CAPSULE | Freq: Every day | ORAL | 1 refills | Status: DC

## 2018-07-11 MED ORDER — LINACLOTIDE 145 MCG PO CAPS: 145.0000 ug | ORAL_CAPSULE | Freq: Every day | ORAL | 1 refills | Status: DC

## 2018-07-11 MED ORDER — SUVOREXANT 15 MG PO TABS: 1.0000 | ORAL_TABLET | Freq: Every day | ORAL | 1 refills | Status: DC | PRN

## 2018-07-11 NOTE — Patient Instructions (Signed)
Anemia  Anemia is a condition in which you do not have enough red blood cells or hemoglobin. Hemoglobin is a substance in red blood cells that carries oxygen. When you do not have enough red blood cells or hemoglobin (are anemic), your body cannot get enough oxygen and your organs may not work properly. As a result, you may feel very tired or have other problems. What are the causes? Common causes of anemia include:  Excessive bleeding. Anemia can be caused by excessive bleeding inside or outside the body, including bleeding from the intestine or from periods in women.  Poor nutrition.  Long-lasting (chronic) kidney, thyroid, and liver disease.  Bone marrow disorders.  Cancer and treatments for cancer.  HIV (human immunodeficiency virus) and AIDS (acquired immunodeficiency syndrome).  Treatments for HIV and AIDS.  Spleen problems.  Blood disorders.  Infections, medicines, and autoimmune disorders that destroy red blood cells. What are the signs or symptoms? Symptoms of this condition include:  Minor weakness.  Dizziness.  Headache.  Feeling heartbeats that are irregular or faster than normal (palpitations).  Shortness of breath, especially with exercise.  Paleness.  Cold sensitivity.  Indigestion.  Nausea.  Difficulty sleeping.  Difficulty concentrating. Symptoms may occur suddenly or develop slowly. If your anemia is mild, you may not have symptoms. How is this diagnosed? This condition is diagnosed based on:  Blood tests.  Your medical history.  A physical exam.  Bone marrow biopsy. Your health care provider may also check your stool (feces) for blood and may do additional testing to look for the cause of your bleeding. You may also have other tests, including:  Imaging tests, such as a CT scan or MRI.  Endoscopy.  Colonoscopy. How is this treated? Treatment for this condition depends on the cause. If you continue to lose a lot of blood, you may  need to be treated at a hospital. Treatment may include:  Taking supplements of iron, vitamin S31, or folic acid.  Taking a hormone medicine (erythropoietin) that can help to stimulate red blood cell growth.  Having a blood transfusion. This may be needed if you lose a lot of blood.  Making changes to your diet.  Having surgery to remove your spleen. Follow these instructions at home:  Take over-the-counter and prescription medicines only as told by your health care provider.  Take supplements only as told by your health care provider.  Follow any diet instructions that you were given.  Keep all follow-up visits as told by your health care provider. This is important. Contact a health care provider if:  You develop new bleeding anywhere in the body. Get help right away if:  You are very weak.  You are short of breath.  You have pain in your abdomen or chest.  You are dizzy or feel faint.  You have trouble concentrating.  You have bloody or black, tarry stools.  You vomit repeatedly or you vomit up blood. Summary  Anemia is a condition in which you do not have enough red blood cells or enough of a substance in your red blood cells that carries oxygen (hemoglobin).  Symptoms may occur suddenly or develop slowly.  If your anemia is mild, you may not have symptoms.  This condition is diagnosed with blood tests as well as a medical history and physical exam. Other tests may be needed.  Treatment for this condition depends on the cause of the anemia. This information is not intended to replace advice given to you by  your health care provider. Make sure you discuss any questions you have with your health care provider. Document Released: 04/07/2004 Document Revised: 04/01/2016 Document Reviewed: 04/01/2016 Elsevier Interactive Patient Education  2019 [employer].

## 2018-07-11 NOTE — Progress Notes (Signed)
° °Subjective:  °Patient ID: Danielle Frazier, female    DOB: 05/18/1930  Age: 85 y.o. MRN: [id number] ° °CC: Anemia; Hypertension; and Atrial Fibrillation ° ° °HPI °Danielle Frazier presents for f/up - She is no longer passing bright red blood per rectum.  She complains of constipation.  She also complains of mild weakness, fatigue, and DOE.  She denies chest pain, palpitations, dizziness, or lightheadedness.  She is taking a vitamin D supplement and a multivitamin but she is not taking an iron supplement. ° °Outpatient Medications Prior to Visit  °Medication Sig Dispense Refill  °• acetaminophen (TYLENOL) 650 MG suppository Place 1 suppository (650 mg total) rectally every 6 (six) hours as needed for mild pain (or Fever >/= 101). 12 suppository 0  °• allopurinol (ZYLOPRIM) 300 MG tablet TAKE 1/2 TABLET BY MOUTH DAILY (Patient taking differently: Take 150 mg by mouth daily. ) 45 tablet 1  °• Blood Glucose Monitoring Suppl (BAYER CONTOUR MONITOR) W/DEVICE KIT 1 Act by Does not apply route 2 (two) times daily. 100 kit 0  °• BYSTOLIC 5 MG tablet TAKE 1 TABLET BY MOUTH DAILY (Patient taking differently: Take 5 mg by mouth daily. ) 90 tablet 1  °• Cholecalciferol (VITAMIN D) 50 MCG (2000 UT) tablet Take 2,000 Units by mouth daily.    °• fluticasone (FLONASE) 50 MCG/ACT nasal spray Place 2 sprays into both nostrils daily. (Patient taking differently: Place 2 sprays into both nostrils 2 (two) times daily. ) 16 g 6  °• glucose blood (BAYER CONTOUR TEST) test strip Use BID 100 each 11  °• lip balm (CARMEX) ointment Apply 1 application topically as needed for lip care (cold sores). 7 g 0  °• Multiple Vitamins-Minerals (CENTRUM SILVER PO) Take 1 tablet by mouth daily.      °• omeprazole (PRILOSEC) 40 MG capsule Take 1 capsule (40 mg total) by mouth daily. 90 capsule 1  °• Linaclotide (LINZESS) 145 MCG CAPS capsule Take 1 capsule (145 mcg total) by mouth daily. (Patient taking differently: Take 145 mcg by mouth daily as needed  (constipation). ) 35 capsule 0  °• ondansetron (ZOFRAN) 4 MG tablet Take 1 tablet (4 mg total) by mouth every 6 (six) hours as needed for nausea. (Patient not taking: Reported on 07/11/2018) 20 tablet 0  ° °No facility-administered medications prior to visit.   ° ° °ROS °Review of Systems  °Constitutional: Positive for fatigue. Negative for appetite change, diaphoresis and unexpected weight change.  °HENT: Negative.   °Eyes: Negative for visual disturbance.  °Respiratory: Positive for shortness of breath. Negative for cough, chest tightness and wheezing.   °Cardiovascular: Negative.  Negative for chest pain and leg swelling.  °Gastrointestinal: Positive for constipation. Negative for abdominal pain, anal bleeding, blood in stool, diarrhea and vomiting.  °Endocrine: Negative.   °Genitourinary: Negative.  Negative for dysuria.  °Musculoskeletal: Positive for arthralgias. Negative for myalgias.  °     She complains of musculoskeletal pain that keeps her awake at night.  °Skin: Negative for color change, pallor and rash.  °Neurological: Positive for weakness. Negative for dizziness, light-headedness and numbness.  °Hematological: Negative for adenopathy. Does not bruise/bleed easily.  °Psychiatric/Behavioral: Positive for sleep disturbance. Negative for dysphoric mood. The patient is nervous/anxious.   ° ° °Objective:  °BP (!) 148/92 (BP Location: Left Arm, Patient Position: Sitting, Cuff Size: Normal)    Pulse 87    Temp 97.7 °F (36.5 °C) (Oral)    Resp 16    Ht 5' 6" (1.676   m)    Wt 186 lb 8 oz (84.6 kg)    SpO2 97%    BMI 30.10 kg/m²  ° °BP Readings from Last 3 Encounters:  °07/11/18 (!) 148/92  °06/08/18 (!) 187/88  °06/07/18 (!) 128/58  ° ° °Wt Readings from Last 3 Encounters:  °07/11/18 186 lb 8 oz (84.6 kg)  °06/08/18 188 lb 3 oz (85.4 kg)  °06/07/18 187 lb (84.8 kg)  ° ° °Physical Exam °Vitals signs reviewed.  °Constitutional:   °   Appearance: She is obese. She is not ill-appearing or diaphoretic.  °HENT:  °    Nose: Nose normal.  °   Mouth/Throat:  °   Mouth: Mucous membranes are moist.  °   Pharynx: Oropharynx is clear.  °Eyes:  °   General: No scleral icterus. °   Conjunctiva/sclera: Conjunctivae normal.  °Neck:  °   Musculoskeletal: Normal range of motion and neck supple. No neck rigidity.  °Cardiovascular:  °   Rate and Rhythm: Normal rate. Rhythm irregularly irregular. Occasional extrasystoles are present. °   Chest Wall: PMI is not displaced.  °   Pulses: Normal pulses.  °   Heart sounds: No murmur.  °   Comments: EKG ---- ° °Atrial fibrillation  - occasional ectopic ventricular beat    °-ST depression   +   Nonspecific T-abnormality  -Nondiagnostic -possible digitalis effect, -consider subendocardial injury/ischemia.  ° °ABNORMAL °Pulmonary:  °   Effort: Pulmonary effort is normal. No respiratory distress.  °   Breath sounds: No stridor. No wheezing, rhonchi or rales.  °Abdominal:  °   General: Bowel sounds are normal.  °   Palpations: There is no hepatomegaly, splenomegaly or mass.  °   Tenderness: There is no abdominal tenderness. There is no guarding.  °Musculoskeletal: Normal range of motion.     °   General: No swelling.  °   Right lower leg: No edema.  °   Left lower leg: No edema.  °Lymphadenopathy:  °   Cervical: No cervical adenopathy.  °Skin: °   General: Skin is warm.  °   Coloration: Skin is not pale.  °Neurological:  °   General: No focal deficit present.  °Psychiatric:     °   Mood and Affect: Mood normal.     °   Behavior: Behavior normal.  ° ° ° °Lab Results  °Component Value Date  ° WBC 5.9 07/11/2018  ° HGB 10.0 (L) 07/11/2018  ° HCT 30.6 (L) 07/11/2018  ° PLT 273.0 07/11/2018  ° GLUCOSE 123 (H) 07/11/2018  ° CHOL 146 07/05/2017  ° TRIG 188.0 (H) 07/05/2017  ° HDL 43.60 07/05/2017  ° LDLDIRECT 90.0 07/06/2015  ° LDLCALC 65 07/05/2017  ° ALT 13 06/07/2018  ° AST 20 06/07/2018  ° NA 141 07/11/2018  ° K 3.9 07/11/2018  ° CL 104 07/11/2018  ° CREATININE 3.20 (H) 07/11/2018  ° BUN 30 (H) 07/11/2018  °  CO2 25 07/11/2018  ° TSH 2.96 07/05/2017  ° INR 1.1 (H) 06/05/2018  ° HGBA1C 6.0 06/04/2018  ° MICROALBUR 155.7 (H) 01/08/2018  ° ° °Ct Abdomen Pelvis Wo Contrast ° °Result Date: 06/07/2018 °CLINICAL DATA:  Rectal bleeding. Patient denies abdominal pain. EXAM: CT ABDOMEN AND PELVIS WITHOUT CONTRAST TECHNIQUE: Multidetector CT imaging of the abdomen and pelvis was performed following the standard protocol without IV contrast. COMPARISON:  None. FINDINGS: Lower chest: Small right pleural effusion and adjacent compressive atelectasis. Trace left pleural thickening and atelectasis. Hepatobiliary:   Calcified granuloma. No focal mass on noncontrast exam. Gallbladder physiologically distended, no calcified stone. No biliary dilatation. Pancreas: No ductal dilatation or inflammation. Spleen: Normal in size without focal abnormality. Adrenals/Urinary Tract: Mild bilateral adrenal thickening without dominant nodule. Bilateral renal parenchymal thinning. No hydronephrosis. There are bilateral renal cysts, largest in the lower left kidney measuring 6 cm cranial caudal. No perinephric edema. Urinary bladder is physiologically distended and unremarkable. Stomach/Bowel: Administered enteric contrast reaches the rectum. Small hiatal hernia. Stomach physiologically distended without gastric wall thickening. No small bowel obstruction or inflammation. Appendix not confidently visualized. Cecum is high-riding in the right mid-upper abdomen. Colonic diverticulosis involving the descending and sigmoid colon. Sigmoid diverticulosis is prominent. Mild pericolonic edema in the distal descending colon, image 53 series 2 in the region of multiple diverticula. Area of questionable soft tissue density the mid sigmoid colon, image 64 series 2, is felt to represent the left ovary immediately adjacent to the colon. Vascular/Lymphatic: Mild to moderate aortic atherosclerosis. No aortic aneurysm. 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  No abdominopelvic adenopathy. Probable calcified pericaval nodes adjacent to the upper IVC. Reproductive: Uterus and bilateral adnexa are unremarkable. Other: Fat in both inguinal canals, right greater than left. No free air, free fluid, or intra-abdominal fluid collection. Musculoskeletal: Degenerative change in the lumbar spine. Bones appear under mineralized. There is trabecular coarsening with mixed lytic and lucent areas involving the right iliac bone, sacrum, and hemipelvis, unchanged from lumbar spine CT 01/23/2018. IMPRESSION: 1. Colonic diverticulosis, prominent in the sigmoid colon. Mild pericolonic stranding and edema in the distal descending colon adjacent to multiple diverticula suspicious for uncomplicated acute diverticulitis. 2. No other explanation for GI bleed. 3. Bilateral renal parenchymal thinning and renal cysts. 4. Trabecular coarsening throughout the right hemipelvis, pattern most typical for Paget's disease. Aortic Atherosclerosis (ICD10-I70.0). Electronically Signed   By: Melanie  Sanford M.D.   On: 06/07/2018 20:14  ° ° °Assessment & Plan:  ° °Danielle Frazier was seen today for anemia, hypertension and atrial fibrillation. ° °Diagnoses and all orders for this visit: ° °Essential hypertension- Her blood pressure is adequately well controlled.  Her electrolytes are normal and her renal function is stable. °-     Basic metabolic panel; Future ° °CKD (chronic kidney disease) stage 5, GFR less than 15 ml/min (HCC)- Her renal function is stable.  She agrees to avoid nephrotoxic agents.  Will continue to maintain control of her blood pressure. °-     Basic metabolic panel; Future ° °Anemia associated with acute blood loss- Her H&H have improved but she has developed iron deficiency.  I have asked her to start taking an iron supplement. °-     CBC with Differential/Platelet; Future °-     IBC panel; Future °-     Ferritin; Future °-     Iron-FA-B Cmp-C-Biot-Probiotic (FUSION PLUS) CAPS; Take 1 capsule by  mouth daily. ° °Persistent atrial fibrillation- She is maintaining rate control.  She is not a candidate for anticoagulation due to the recent GI bleeding. °-     EKG 12-Lead ° °Constipation, chronic °-     linaclotide (LINZESS) 145 MCG CAPS capsule; Take 1 capsule (145 mcg total) by mouth daily. ° °Insomnia secondary to chronic pain °-     Suvorexant (BELSOMRA) 15 MG TABS; Take 1 tablet by mouth daily as needed. ° °Type 2 diabetes mellitus with diabetic nephropathy, without long-term current use of insulin (HCC) °-     HM Diabetes Foot Exam ° ° °I am   am having Danielle Frazier start on Fusion Plus and Suvorexant. I am also having her maintain her Multiple Vitamins-Minerals (CENTRUM SILVER PO), Bayer Contour Monitor, glucose blood, allopurinol, fluticasone, Bystolic, omeprazole, Vitamin D, acetaminophen, ondansetron, lip balm, and linaclotide.  Meds ordered this encounter  Medications   Iron-FA-B Cmp-C-Biot-Probiotic (FUSION PLUS) CAPS    Sig: Take 1 capsule by mouth daily.    Dispense:  90 capsule    Refill:  1   linaclotide (LINZESS) 145 MCG CAPS capsule    Sig: Take 1 capsule (145 mcg total) by mouth daily.    Dispense:  90 capsule    Refill:  1   Suvorexant (BELSOMRA) 15 MG TABS    Sig: Take 1 tablet by mouth daily as needed.    Dispense:  90 tablet    Refill:  1     Follow-up: Return in about 4 months (around 11/10/2018).  [name], MD

## 2018-07-12 ENCOUNTER — Encounter: Payer: Self-pay | Admitting: Family Medicine

## 2018-07-12 ENCOUNTER — Ambulatory Visit: Payer: Medicare Other | Admitting: Family Medicine

## 2018-07-12 ENCOUNTER — Ambulatory Visit: Payer: Self-pay

## 2018-07-12 VITALS — BP 124/70 | HR 70 | Ht 66.0 in | Wt 187.0 lb

## 2018-07-12 DIAGNOSIS — M7061 Trochanteric bursitis, right hip: Secondary | ICD-10-CM | POA: Diagnosis not present

## 2018-07-12 DIAGNOSIS — M25551 Pain in right hip: Secondary | ICD-10-CM

## 2018-07-12 NOTE — Patient Instructions (Signed)
Good to see you  Ice 20 minutes 2 times daily. Usually after activity and before bed. pennsaid pinkie amount topically 2 times daily as needed.  Keep active If this does not work all the way we will consider the piriformis injection or hip joint injection

## 2018-07-12 NOTE — Progress Notes (Signed)
[facility] [address] [place], [place] [address] Phone: [phone] Subjective:   Fontaine No, am serving as a scribe for Dr. Hulan Saas.  I'm seeing this patient by the request  of:    CC: Hip pain  QIO:[id number]   12/11/2017: Lumbar radiculopathy.  Discussed icing regimen, home exercise, which activities to do which wants to avoid.  We discussed avoiding certain activities but I do believe that the minimal improvement with the greater trochanteric injection the radicular symptoms likely contributing.  Patient could be a candidate for epidurals or nerve root injections.  Patient is in agreement with the plan.  After imaging will call and discuss.  Follow-up again in 4 weeks.  Continue the same medications. Spent  25 minutes with patient face-to-face and had greater than 50% of counseling including as described above in assessment and plan.  Epidural 02/06/2018  Update 07/12/2018: Danielle Frazier is a 85 y.o. female coming in with complaint of right hip pain. Did have epidural which helped her pain for 1 month. Has been in pain for past few months. Pain is in right glute and into her hamstring. Pain is worse at night. She wakes up and ices her leg. Does have dull pain during the day.  Patient feels it is more located to the posterior lateral aspect of the hip is where she points.  States that it can spasm with her sitting.  Symptoms severe pain at night.    Past Medical History:  Diagnosis Date  . Diabetes mellitus    type II  . Gout   . Hyperlipidemia   . Hypertension   . LBP (low back pain)    foraminal narrowing at L4-5 and disc bulge at L5- S1 on MRI 6-08  . Renal insufficiency   . Urinary incontinence    Past Surgical History:  Procedure Laterality Date  . ESOPHAGOGASTRODUODENOSCOPY (EGD) WITH PROPOFOL N/A 06/08/2018   Procedure: ESOPHAGOGASTRODUODENOSCOPY (EGD) WITH PROPOFOL;  Surgeon: [name], MD;  Location: [facility];   Service: Gastroenterology;  Laterality: N/A;   Social History   Socioeconomic History  . Marital status: Married    Spouse name: Not on file  . Number of children: Not on file  . Years of education: Not on file  . Highest education level: Not on file  Occupational History  . Not on file  Social Needs  . Financial resource strain: Not on file  . Food insecurity:    Worry: Not on file    Inability: Not on file  . Transportation needs:    Medical: Not on file    Non-medical: Not on file  Tobacco Use  . Smoking status: Never Smoker  . Smokeless tobacco: Never Used  Substance and Sexual Activity  . Alcohol use: No  . Drug use: No  . Sexual activity: Not Currently  Lifestyle  . Physical activity:    Days per week: Not on file    Minutes per session: Not on file  . Stress: Not on file  Relationships  . Social connections:    Talks on phone: Not on file    Gets together: Not on file    Attends religious service: Not on file    Active member of club or organization: Not on file    Attends meetings of clubs or organizations: Not on file    Relationship status: Not on file  Other Topics Concern  . Not on file  Social History Narrative  .  Not on file   Allergies  Allergen Reactions  . Crestor [Rosuvastatin]     Muscle aches  . Metformin     REACTION: renal damage   Family History  Problem Relation Age of Onset  . Diabetes Other   . Hypertension Other      Current Outpatient Medications (Cardiovascular):  .  BYSTOLIC 5 MG tablet, TAKE 1 TABLET BY MOUTH DAILY (Patient taking differently: Take 5 mg by mouth daily. )  Current Outpatient Medications (Respiratory):  .  fluticasone (FLONASE) 50 MCG/ACT nasal spray, Place 2 sprays into both nostrils daily. (Patient taking differently: Place 2 sprays into both nostrils 2 (two) times daily. )  Current Outpatient Medications (Analgesics):  .  acetaminophen (TYLENOL) 650 MG suppository, Place 1 suppository (650 mg total)  rectally every 6 (six) hours as needed for mild pain (or Fever >/= 101). Marland Kitchen  allopurinol (ZYLOPRIM) 300 MG tablet, TAKE 1/2 TABLET BY MOUTH DAILY (Patient taking differently: Take 150 mg by mouth daily. )  Current Outpatient Medications (Hematological):  Marland Kitchen  Iron-FA-B Cmp-C-Biot-Probiotic (FUSION PLUS) CAPS, Take 1 capsule by mouth daily.  Current Outpatient Medications (Other):  .  Blood Glucose Monitoring Suppl (BAYER CONTOUR MONITOR) W/DEVICE KIT, 1 Act by Does not apply route 2 (two) times daily. .  Cholecalciferol (VITAMIN D) 50 MCG (2000 UT) tablet, Take 2,000 Units by mouth daily. Marland Kitchen  glucose blood (BAYER CONTOUR TEST) test strip, Use BID .  linaclotide (LINZESS) 145 MCG CAPS capsule, Take 1 capsule (145 mcg total) by mouth daily. Marland Kitchen  lip balm (CARMEX) ointment, Apply 1 application topically as needed for lip care (cold sores). .  Multiple Vitamins-Minerals (CENTRUM SILVER PO), Take 1 tablet by mouth daily.   Marland Kitchen  omeprazole (PRILOSEC) 40 MG capsule, Take 1 capsule (40 mg total) by mouth daily. .  ondansetron (ZOFRAN) 4 MG tablet, Take 1 tablet (4 mg total) by mouth every 6 (six) hours as needed for nausea. .  Suvorexant (BELSOMRA) 15 MG TABS, Take 1 tablet by mouth daily as needed.    Past medical history, social, surgical and family history all reviewed in electronic medical record.  No pertanent information unless stated regarding to the chief complaint.   Review of Systems:  No headache, visual changes, nausea, vomiting, diarrhea, constipation, dizziness, abdominal pain, skin rash, fevers, chills, night sweats, weight loss, swollen lymph nodes, body aches, joint swelling, muscle aches, chest pain, shortness of breath, mood changes.   Objective  Blood pressure 124/70, pulse 70, height _0  (1.676 m), weight 187 lb (84.8 kg), SpO2 96 %.    General: No apparent distress alert and oriented x3 mood and affect normal, dressed appropriately.  HEENT: Pupils equal, extraocular movements  intact  Respiratory: Patient's speak in full sentences and does not appear short of breath  Cardiovascular: No lower extremity edema, non tender, no erythema  Skin: Warm dry intact with no signs of infection or rash on extremities or on axial skeleton.  Abdomen: Soft nontender  Neuro: Cranial nerves II through XII are intact, neurovascularly intact in all extremities with 2+ DTRs and 1+ pulses.  Lymph: No lymphadenopathy of posterior or anterior cervical chain or axillae bilaterally.  Gait antalgicwalking with the aid of a cane MSK:  tender with limited range of motion and stability and symmetric strength and tone of shoulders, elbows, wrist,  knee and ankles bilaterally.  Right hip exam shows the patient does have decreased range of motion of at least 10 degrees in all planes.  4-5  strength and near symmetric with the contralateral side.  Neurovascularly distally.  Patient does have 1+ distal pulses.  Patient is severely tender to palpation over the distal gluteal region more laterally.  Patient does have worsening pain with resisted hip abduction.  Negative straight leg test at this time but does have significant tightness of the hamstring.  Procedure: Real-time Ultrasound Guided Injection of right gluteal tendon sheath Device: GE Logiq Q7 Ultrasound guided injection is preferred based studies that show increased duration, increased effect, greater accuracy, decreased procedural pain, increased response rate, and decreased cost with ultrasound guided versus blind injection.  Verbal informed consent obtained.  Time-out conducted.  Noted no overlying erythema, induration, or other signs of local infection.  Skin prepped in a sterile fashion.  Local anesthesia: Topical Ethyl chloride.  With sterile technique and under real time ultrasound guidance: With a 21-gauge 2 inch needle injected with 0.5 cc of 0.5% Marcaine and 1 cc of Kenalog 40 mg/mL into the tendon sheath. Completed without difficulty   Pain immediately resolved suggesting accurate placement of the medication.  Advised to call if fevers/chills, erythema, induration, drainage, or persistent bleeding.  Images permanently stored and available for review in the ultrasound unit.  Impression: Technically successful ultrasound guided injection.   Impression and Recommendations:     This case required medical decision making of moderate complexity. The above documentation has been reviewed and is accurate and complete Lyndal Pulley, DO       Note: This dictation was prepared with Dragon dictation along with smaller phrase technology. Any transcriptional errors that result from this process are unintentional.

## 2018-07-13 ENCOUNTER — Encounter: Payer: Self-pay | Admitting: Family Medicine

## 2018-07-13 NOTE — Assessment & Plan Note (Signed)
Patient given a repeat injection here as well as the distal gluteal tendon sheath. Think this was all done in 1 injection but more into a lateral to medial anterior to posterior approach.  Patient tolerated the procedure well.  No weakness after the injection.  Still believe that lumbar radiculopathy is likely contributing secondary to what we have seen on the MRI but patient has only had a short-term relief with the epidural and patient was adamant she did not want to try it again.  Encourage her to continue to walk with a walker.  If continuing to have difficulty may need to consider more aggressive therapy such as surgical intervention but patient is somewhat a high risk individual.  Patient is in agreement with the plan and will follow-up again in 4 weeks

## 2018-08-08 NOTE — Progress Notes (Signed)
[facility] [address] [place], [place] [address] Phone: [phone] Subjective:   [name] am serving as a Education administrator for Dr. Hulan Saas.   CC: Right hip pain  IWL:[id number]  Danielle Frazier is a 85 y.o. female coming in with complaint of right hip pain. States that it was worse yesterday. Recently had a blood transfusion. Has noticed that she is more out of breath than normal.  Patient states that it seems to be on the lateral aspect of the head.  Does have some leg pain as well noted. Patient's previous imaging from a year ago did show patient had significant amount of stenosis and seemed to be fairly severe at L4-L5.  One injection in November and did well     Past Medical History:  Diagnosis Date  . Diabetes mellitus    type II  . Gout   . Hyperlipidemia   . Hypertension   . LBP (low back pain)    foraminal narrowing at L4-5 and disc bulge at L5- S1 on MRI 6-08  . Renal insufficiency   . Urinary incontinence    Past Surgical History:  Procedure Laterality Date  . ESOPHAGOGASTRODUODENOSCOPY (EGD) WITH PROPOFOL N/A 06/08/2018   Procedure: ESOPHAGOGASTRODUODENOSCOPY (EGD) WITH PROPOFOL;  Surgeon: [name], MD;  Location: [facility];  Service: Gastroenterology;  Laterality: N/A;   Social History   Socioeconomic History  . Marital status: Married    Spouse name: Not on file  . Number of children: Not on file  . Years of education: Not on file  . Highest education level: Not on file  Occupational History  . Not on file  Social Needs  . Financial resource strain: Not on file  . Food insecurity:    Worry: Not on file    Inability: Not on file  . Transportation needs:    Medical: Not on file    Non-medical: Not on file  Tobacco Use  . Smoking status: Never Smoker  . Smokeless tobacco: Never Used  Substance and Sexual Activity  . Alcohol use: No  . Drug use: No  . Sexual activity: Not Currently  Lifestyle  . Physical  activity:    Days per week: Not on file    Minutes per session: Not on file  . Stress: Not on file  Relationships  . Social connections:    Talks on phone: Not on file    Gets together: Not on file    Attends religious service: Not on file    Active member of club or organization: Not on file    Attends meetings of clubs or organizations: Not on file    Relationship status: Not on file  Other Topics Concern  . Not on file  Social History Narrative  . Not on file   Allergies  Allergen Reactions  . Crestor [Rosuvastatin]     Muscle aches  . Metformin     REACTION: renal damage   Family History  Problem Relation Age of Onset  . Diabetes Other   . Hypertension Other      Current Outpatient Medications (Cardiovascular):  .  BYSTOLIC 5 MG tablet, TAKE 1 TABLET BY MOUTH DAILY (Patient taking differently: Take 5 mg by mouth daily. )  Current Outpatient Medications (Respiratory):  .  fluticasone (FLONASE) 50 MCG/ACT nasal spray, Place 2 sprays into both nostrils daily. (Patient taking differently: Place 2 sprays into both nostrils 2 (two) times daily. )  Current Outpatient Medications (  Analgesics):  .  acetaminophen (TYLENOL) 650 MG suppository, Place 1 suppository (650 mg total) rectally every 6 (six) hours as needed for mild pain (or Fever >/= 101). Marland Kitchen  allopurinol (ZYLOPRIM) 300 MG tablet, TAKE 1/2 TABLET BY MOUTH DAILY (Patient taking differently: Take 150 mg by mouth daily. )  Current Outpatient Medications (Hematological):  Marland Kitchen  Iron-FA-B Cmp-C-Biot-Probiotic (FUSION PLUS) CAPS, Take 1 capsule by mouth daily.  Current Outpatient Medications (Other):  .  Blood Glucose Monitoring Suppl (BAYER CONTOUR MONITOR) W/DEVICE KIT, 1 Act by Does not apply route 2 (two) times daily. .  Cholecalciferol (VITAMIN D) 50 MCG (2000 UT) tablet, Take 2,000 Units by mouth daily. Marland Kitchen  glucose blood (BAYER CONTOUR TEST) test strip, Use BID .  linaclotide (LINZESS) 145 MCG CAPS capsule, Take 1  capsule (145 mcg total) by mouth daily. Marland Kitchen  lip balm (CARMEX) ointment, Apply 1 application topically as needed for lip care (cold sores). .  Multiple Vitamins-Minerals (CENTRUM SILVER PO), Take 1 tablet by mouth daily.   Marland Kitchen  omeprazole (PRILOSEC) 40 MG capsule, Take 1 capsule (40 mg total) by mouth daily. .  ondansetron (ZOFRAN) 4 MG tablet, Take 1 tablet (4 mg total) by mouth every 6 (six) hours as needed for nausea. .  Suvorexant (BELSOMRA) 15 MG TABS, Take 1 tablet by mouth daily as needed.    Past medical history, social, surgical and family history all reviewed in electronic medical record.  No pertanent information unless stated regarding to the chief complaint.   Review of Systems:  No headache, visual changes, nausea, vomiting, diarrhea, constipation, dizziness, abdominal pain, skin rash, fevers, chills, night sweats, weight loss, swollen lymph nodes, body aches, joint swelling,  chest pain, shortness of breath, mood changes.  Positive muscle aches  Objective  Blood pressure (!) 154/72, pulse 94, height _0  (1.676 m), weight 182 lb (82.6 kg), SpO2 98 %.    General: No apparent distress alert and oriented x3 mood and affect normal, dressed appropriately.  HEENT: Pupils equal, extraocular movements intact  Respiratory: Patient's speak in full sentences and does not appear short of breath  Cardiovascular: No lower extremity edema, non tender, no erythema  Skin: Warm dry intact with no signs of infection or rash on extremities or on axial skeleton.  Abdomen: Soft nontender  Neuro: Cranial nerves II through XII are intact, neurovascularly intact in all extremities with 2+ DTRs and 2+ pulses.  Lymph: No lymphadenopathy of posterior or anterior cervical chain or axillae bilaterally.  Gait antalgic  MSK:  Arthritic changes  Right hip pain still over the greater trochanteric area as well as the gluteal tendon.  Mild over the piriformis.  Tender to palpation in the paraspinal musculature  of the lumbar spine  Procedure: Real-time Ultrasound Guided Injection of right gluteal tendinitis Device: GE Logiq Q7 Ultrasound guided injection is preferred based studies that show increased duration, increased effect, greater accuracy, decreased procedural pain, increased response rate, and decreased cost with ultrasound guided versus blind injection.  Verbal informed consent obtained.  Time-out conducted.  Noted no overlying erythema, induration, or other signs of local infection.  Skin prepped in a sterile fashion.  Local anesthesia: Topical Ethyl chloride.  With sterile technique and under real time ultrasound guidance: With a 21-gauge 3 inch needle patient was injected with 1 cc of 0.5% Marcaine and 1 cc of Kenalog 40 mg/mL Completed without difficulty  Pain immediately resolved suggesting accurate placement of the medication.  Advised to call if fevers/chills, erythema, induration, drainage,  or persistent bleeding.  Images permanently stored and available for review in the ultrasound unit.  Impression: Technically successful ultrasound guided injection.    ImPpression and Recommendations:     This case required medical decision making of moderate complexity. The above documentation has been reviewed and is accurate and complete Lyndal Pulley, DO       Note: This dictation was prepared with Dragon dictation along with smaller phrase technology. Any transcriptional errors that result from this process are unintentional.

## 2018-08-09 ENCOUNTER — Ambulatory Visit (INDEPENDENT_AMBULATORY_CARE_PROVIDER_SITE_OTHER): Payer: Medicare Other | Admitting: Family Medicine

## 2018-08-09 ENCOUNTER — Other Ambulatory Visit (INDEPENDENT_AMBULATORY_CARE_PROVIDER_SITE_OTHER): Payer: Medicare Other

## 2018-08-09 ENCOUNTER — Encounter: Payer: Self-pay | Admitting: Family Medicine

## 2018-08-09 ENCOUNTER — Other Ambulatory Visit: Payer: Self-pay

## 2018-08-09 ENCOUNTER — Ambulatory Visit: Payer: Self-pay

## 2018-08-09 VITALS — BP 154/72 | HR 94 | Ht 66.0 in | Wt 182.0 lb

## 2018-08-09 DIAGNOSIS — M25551 Pain in right hip: Secondary | ICD-10-CM

## 2018-08-09 DIAGNOSIS — M5416 Radiculopathy, lumbar region: Secondary | ICD-10-CM | POA: Diagnosis not present

## 2018-08-09 DIAGNOSIS — M2550 Pain in unspecified joint: Secondary | ICD-10-CM | POA: Diagnosis not present

## 2018-08-09 LAB — BASIC METABOLIC PANEL
BUN: 31 mg/dL — ABNORMAL HIGH (ref 6–23)
CO2: 27 mEq/L (ref 19–32)
Calcium: 10.4 mg/dL (ref 8.4–10.5)
Chloride: 105 mEq/L (ref 96–112)
Creatinine, Ser: 2.88 mg/dL — ABNORMAL HIGH (ref 0.40–1.20)
GFR: 15.53 mL/min — ABNORMAL LOW (ref >60.00)
Glucose, Bld: 131 mg/dL — ABNORMAL HIGH (ref 70–99)
Potassium: 3.7 mEq/L (ref 3.5–5.1)
Sodium: 142 mEq/L (ref 135–145)

## 2018-08-09 LAB — CBC WITH DIFFERENTIAL/PLATELET
Basophils Absolute: 0 10*3/uL (ref 0.0–0.1)
Basophils Relative: 0.7 % (ref 0.0–3.0)
Eosinophils Absolute: 0.1 10*3/uL (ref 0.0–0.7)
Eosinophils Relative: 1.6 % (ref 0.0–5.0)
HCT: 30.9 % — ABNORMAL LOW (ref 36.0–46.0)
Hemoglobin: 10 g/dL — ABNORMAL LOW (ref 12.0–15.0)
Lymphocytes Relative: 19.1 % (ref 12.0–46.0)
Lymphs Abs: 1.2 10*3/uL (ref 0.7–4.0)
MCHC: 32.4 g/dL (ref 30.0–36.0)
MCV: 94.2 fl (ref 78.0–100.0)
Monocytes Absolute: 0.5 10*3/uL (ref 0.1–1.0)
Monocytes Relative: 7.8 % (ref 3.0–12.0)
Neutro Abs: 4.4 10*3/uL (ref 1.4–7.7)
Neutrophils Relative %: 70.8 % (ref 43.0–77.0)
Platelets: 210 10*3/uL (ref 150.0–400.0)
RBC: 3.28 Mil/uL — ABNORMAL LOW (ref 3.87–5.11)
RDW: 16 % — ABNORMAL HIGH (ref 11.5–15.5)
WBC: 6.2 10*3/uL (ref 4.0–10.5)

## 2018-08-09 LAB — FERRITIN: Ferritin: 25.4 ng/mL (ref 10.0–291.0)

## 2018-08-09 NOTE — Patient Instructions (Addendum)
Great to see you  Danielle Frazier is your friend Injected the side of the hip today  We will get labs to make sure you are doing well and still not having trouble with bleeding  See me again in 8 weeks to make sure hip is doing well

## 2018-08-09 NOTE — Assessment & Plan Note (Signed)
Patient given an injection in the gluteal area.  I do believe more secondary to the lumbar stenosis is likely contributing.  Encouraged him to consider another epidural.  Follow-up again in 3 weeks

## 2018-08-15 ENCOUNTER — Telehealth: Payer: Self-pay

## 2018-08-15 NOTE — Telephone Encounter (Signed)
Left message for patient that labs are stable.

## 2018-08-15 NOTE — Telephone Encounter (Signed)
Copied from [place] [id number]. Topic: General - Inquiry >> Aug 15, 2018  2:03 PM Berneta Levins wrote: Reason for CRM:   Pt calling to find out lab results done by Dr. Tamala Julian

## 2018-08-21 ENCOUNTER — Ambulatory Visit: Payer: Self-pay | Admitting: *Deleted

## 2018-08-21 NOTE — Telephone Encounter (Signed)
LVM for patient to call back to make appt  °

## 2018-08-21 NOTE — Telephone Encounter (Signed)
Noticed ankle and foot edema, bilateral, increasing over the last 2 weeks. Having her touch the top of her foot, it took 3 seconds to refill color. No difficulty breathing and no pain/CP. No fever/no calf pain. Swelling begins near the ankles of both legs and is the same bilateral. She is able to walk without difficulty. Does report kidney disease but not under a nephrologist care at this time. Not on a diuretic. Voids adequate amount 3-4 times daily. Has not increased Na in diet. Attempted to transfer for appointment. No answer X2. Discussed Reviewed urgent symptoms she would need to seek immediate attention with.Stated she understood. Routing to PCP for follow up with patient. Reason for Disposition . [1] MILD swelling of both ankles (i.e., pedal edema) AND [2] new onset or worsening  Answer Assessment - Initial Assessment Questions 1. LOCATION: "Which joint is swollen?"     Both ankles with edema 2. ONSET: "When did the swelling start?"     Last 2 weeks, has worsened 3. SIZE: "How large is the swelling?"     3+ she reports 4. PAIN: "Is there any pain?" If so, ask: "How bad is it?" (Scale 1-10; or mild, moderate, severe)    No pain 5. CAUSE: "What do you think caused the swollen joint?"     Does not know. 6. OTHER SYMPTOMS: "Do you have any other symptoms?" (e.g., fever, chest pain, difficulty breathing, calf pain)     none 7. PREGNANCY: "Is there any chance you are pregnant?" "When was your last menstrual period?"     na  Answer Assessment - Initial Assessment Questions 1. ONSET: "When did the swelling start?" (e.g., minutes, hours, days)     Noticed increasing swelling about 2 weeks ago. 2. LOCATION: "What part of the leg is swollen?"  "Are both legs swollen or just one leg?"     Both ankles and feet 3. SEVERITY: "How bad is the swelling?" (e.g., localized; mild, moderate, severe)  - Localized - small area of swelling localized to one leg  - MILD pedal edema - swelling limited to foot  and ankle, pitting edema < 1/4 inch (6 mm) deep, rest and elevation eliminate most or all swelling  - MODERATE edema - swelling of lower leg to knee, pitting edema > 1/4 inch (6 mm) deep, rest and elevation only partially reduce swelling  - SEVERE edema - swelling extends above knee, facial or hand swelling present     Starts at ankles 4. REDNESS: "Does the swelling look red or infected?"    no 5. PAIN: "Is the swelling painful to touch?" If so, ask: "How painful is it?"   (Scale 1-10; mild, moderate or severe)     no 6. FEVER: "Do you have a fever?" If so, ask: "What is it, how was it measured, and when did it start?"      no 7. CAUSE: "What do you think is causing the leg swelling?"     Unsure. 8. MEDICAL HISTORY: "Do you have a history of heart failure, kidney disease, liver failure, or cancer?"    Kidney disease. htn 9. RECURRENT SYMPTOM: "Have you had leg swelling before?" If so, ask: "When was the last time?" "What happened that time?"     unsure 10. OTHER SYMPTOMS: "Do you have any other symptoms?" (e.g., chest pain, difficulty breathing)       none 11. PREGNANCY: "Is there any chance you are pregnant?" "When was your last menstrual period?"  Na  Protocols used: LEG SWELLING AND EDEMA-A-AH, ANKLE SWELLING-A-AH

## 2018-08-21 NOTE — Telephone Encounter (Signed)
I called pt and offered an appointment. In the middle of the call, call dropped.   Can you call her back for me and offer OV with Mickel Baas please.

## 2018-08-22 ENCOUNTER — Ambulatory Visit (INDEPENDENT_AMBULATORY_CARE_PROVIDER_SITE_OTHER)
Admission: RE | Admit: 2018-08-22 | Discharge: 2018-08-22 | Disposition: A | Discharge status: Home / Self Care | Payer: Medicare Other | Source: Ambulatory Visit | Attending: Family | Admitting: Family

## 2018-08-22 ENCOUNTER — Other Ambulatory Visit (INDEPENDENT_AMBULATORY_CARE_PROVIDER_SITE_OTHER): Payer: Medicare Other

## 2018-08-22 ENCOUNTER — Encounter: Payer: Self-pay | Admitting: Family

## 2018-08-22 ENCOUNTER — Ambulatory Visit (INDEPENDENT_AMBULATORY_CARE_PROVIDER_SITE_OTHER): Payer: Medicare Other | Admitting: Family

## 2018-08-22 ENCOUNTER — Other Ambulatory Visit: Payer: Self-pay

## 2018-08-22 VITALS — BP 158/84 | HR 81 | Temp 98.2°F | Ht 66.0 in | Wt 183.0 lb

## 2018-08-22 DIAGNOSIS — D62 Acute posthemorrhagic anemia: Secondary | ICD-10-CM

## 2018-08-22 DIAGNOSIS — R600 Localized edema: Secondary | ICD-10-CM

## 2018-08-22 LAB — CBC WITH DIFFERENTIAL/PLATELET
Basophils Absolute: 0.1 10*3/uL (ref 0.0–0.1)
Basophils Relative: 0.8 % (ref 0.0–3.0)
Eosinophils Absolute: 0.1 10*3/uL (ref 0.0–0.7)
Eosinophils Relative: 1.7 % (ref 0.0–5.0)
HCT: 32.1 % — ABNORMAL LOW (ref 36.0–46.0)
Hemoglobin: 10.3 g/dL — ABNORMAL LOW (ref 12.0–15.0)
Lymphocytes Relative: 22.7 % (ref 12.0–46.0)
Lymphs Abs: 1.5 10*3/uL (ref 0.7–4.0)
MCHC: 31.9 g/dL (ref 30.0–36.0)
MCV: 93.5 fl (ref 78.0–100.0)
Monocytes Absolute: 0.5 10*3/uL (ref 0.1–1.0)
Monocytes Relative: 7.6 % (ref 3.0–12.0)
Neutro Abs: 4.4 10*3/uL (ref 1.4–7.7)
Neutrophils Relative %: 67.2 % (ref 43.0–77.0)
Platelets: 216 10*3/uL (ref 150.0–400.0)
RBC: 3.44 Mil/uL — ABNORMAL LOW (ref 3.87–5.11)
RDW: 16.8 % — ABNORMAL HIGH (ref 11.5–15.5)
WBC: 6.5 10*3/uL (ref 4.0–10.5)

## 2018-08-22 LAB — COMPREHENSIVE METABOLIC PANEL
ALT: 11 U/L (ref 0–35)
AST: 16 U/L (ref 0–37)
Albumin: 3.8 g/dL (ref 3.5–5.2)
Alkaline Phosphatase: 127 U/L — ABNORMAL HIGH (ref 39–117)
BUN: 40 mg/dL — ABNORMAL HIGH (ref 6–23)
CO2: 26 mEq/L (ref 19–32)
Calcium: 10.7 mg/dL — ABNORMAL HIGH (ref 8.4–10.5)
Chloride: 106 mEq/L (ref 96–112)
Creatinine, Ser: 2.81 mg/dL — ABNORMAL HIGH (ref 0.40–1.20)
GFR: 15.98 mL/min — ABNORMAL LOW (ref >60.00)
Glucose, Bld: 107 mg/dL — ABNORMAL HIGH (ref 70–99)
Potassium: 4.1 mEq/L (ref 3.5–5.1)
Sodium: 142 mEq/L (ref 135–145)
Total Bilirubin: 0.5 mg/dL (ref 0.2–1.2)
Total Protein: 6.7 g/dL (ref 6.0–8.3)

## 2018-08-22 LAB — BRAIN NATRIURETIC PEPTIDE: Pro B Natriuretic peptide (BNP): 1239 pg/mL — ABNORMAL HIGH (ref 0.0–100.0)

## 2018-08-22 MED ORDER — FUROSEMIDE 20 MG PO TABS: 20.0000 mg | ORAL_TABLET | Freq: Two times a day (BID) | ORAL | 0 refills | Status: DC | PRN

## 2018-08-22 MED ORDER — POTASSIUM CHLORIDE CRYS ER 20 MEQ PO TBCR: 20.0000 meq | EXTENDED_RELEASE_TABLET | Freq: Two times a day (BID) | ORAL | 0 refills | Status: DC | PRN

## 2018-08-22 NOTE — Progress Notes (Signed)
Danielle Frazier is a 85 y.o. female with the following history as recorded in Epic[facility]:  Patient Active Problem List   Diagnosis Date Noted  . Insomnia secondary to chronic pain 07/11/2018  . Persistent atrial fibrillation 06/07/2018  . BRBPR (bright red blood per rectum) 06/04/2018  . Anemia associated with acute blood loss 06/04/2018  . Lumbar radiculopathy 12/11/2017  . Greater trochanteric bursitis of right hip 11/07/2017  . Gastroesophageal reflux disease without esophagitis 07/06/2015  . Routine general medical examination at a health care facility 07/06/2015  . Constipation, chronic 04/24/2013  . Morbid obesity ([facility]) 03/22/2013  . 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 Bilateral renal cysts 09/07/2011  . Hyperlipidemia with target LDL less than 100 12/30/2009  . DM (diabetes mellitus), type 2 with renal complications ([place]) 69/62/9528  . GOUT 08/25/2008  . Essential hypertension 08/25/2008  . CKD (chronic kidney disease) stage 5, GFR less than 15 ml/min ([facility]) 08/25/2008  . OAB (overactive bladder) 08/25/2008    Current Outpatient Medications  Medication Sig Dispense Refill  . acetaminophen (TYLENOL) 650 MG suppository Place 1 suppository (650 mg total) rectally every 6 (six) hours as needed for mild pain (or Fever >/= 101). 12 suppository 0  . allopurinol (ZYLOPRIM) 300 MG tablet TAKE 1/2 TABLET BY MOUTH DAILY (Patient taking differently: Take 150 mg by mouth daily. ) 45 tablet 1  . Blood Glucose Monitoring Suppl (BAYER CONTOUR MONITOR) W/DEVICE KIT 1 Act by Does not apply route 2 (two) times daily. 100 kit 0  . BYSTOLIC 5 MG tablet TAKE 1 TABLET BY MOUTH DAILY (Patient taking differently: Take 5 mg by mouth daily. ) 90 tablet 1  . Cholecalciferol (VITAMIN D) 50 MCG (2000 UT) tablet Take 2,000 Units by mouth daily.    . fluticasone (FLONASE) 50 MCG/ACT nasal spray Place 2 sprays into both nostrils daily. (Patient taking differently: Place 2 sprays into both nostrils 2 (two) times daily. )  16 g 6  . glucose blood (BAYER CONTOUR TEST) test strip Use BID 100 each 11  . Iron-FA-B Cmp-C-Biot-Probiotic (FUSION PLUS) CAPS Take 1 capsule by mouth daily. 90 capsule 1  . linaclotide (LINZESS) 145 MCG CAPS capsule Take 1 capsule (145 mcg total) by mouth daily. 90 capsule 1  . lip balm (CARMEX) ointment Apply 1 application topically as needed for lip care (cold sores). 7 g 0  . Multiple Vitamins-Minerals (CENTRUM SILVER PO) Take 1 tablet by mouth daily.      Marland Kitchen omeprazole (PRILOSEC) 40 MG capsule Take 1 capsule (40 mg total) by mouth daily. 90 capsule 1  . ondansetron (ZOFRAN) 4 MG tablet Take 1 tablet (4 mg total) by mouth every 6 (six) hours as needed for nausea. 20 tablet 0  . Suvorexant (BELSOMRA) 15 MG TABS Take 1 tablet by mouth daily as needed. 90 tablet 1  . furosemide (LASIX) 20 MG tablet Take 1 tablet (20 mg total) by mouth 2 (two) times daily as needed for fluid or edema. 60 tablet 0  . LINZESS 145 MCG CAPS capsule     . potassium chloride SA (K-DUR) 20 MEQ tablet Take 1 tablet (20 mEq total) by mouth 2 (two) times daily as needed. 30 tablet 0   No current facility-administered medications for this visit.     Allergies: Crestor [rosuvastatin] and Metformin  Past Medical History:  Diagnosis Date  . Diabetes mellitus    type II  . Gout   . Hyperlipidemia   . Hypertension   . LBP (low back  pain)    foraminal narrowing at L4-5 and disc bulge at L5- S1 on MRI 6-08  . Renal insufficiency   . Urinary incontinence     Past Surgical History:  Procedure Laterality Date  . ESOPHAGOGASTRODUODENOSCOPY (EGD) WITH PROPOFOL N/A 06/08/2018   Procedure: ESOPHAGOGASTRODUODENOSCOPY (EGD) WITH PROPOFOL;  Surgeon: [name], MD;  Location: [facility];  Service: Gastroenterology;  Laterality: N/A;    Family History  Problem Relation Age of Onset  . Diabetes Other   . Hypertension Other     Social History   Tobacco Use  . Smoking status: Never Smoker  . Smokeless tobacco:  Never Used  Substance Use Topics  . Alcohol use: No    Subjective:  Patient presents with concerns for 2 week history of swelling in ankles. No chest pain, no shortness of breath or wheezing; no prior history of CHF but does have CKD and A. Fib;  Was admitted with GI bleed in March and just feels that "things haven't been right since that time." Not currently taking any iron; still feeling tired.     Objective:  Vitals:   08/22/18 0959  BP: (!) 158/84  Pulse: 81  Temp: 98.2 F (36.8 C)  TempSrc: Oral  SpO2: 98%  Weight: 183 lb (83 kg)  Height: _0  (1.676 m)    General: Well developed, well nourished, in no acute distress  Skin : Warm and dry.  Head: Normocephalic and atraumatic  Lungs: Respirations unlabored; clear to auscultation bilaterally without wheeze, rales, rhonchi  CVS exam: normal rate and regular rhythm.  Extremities: bilateral 2+ pitting edema, cyanosis, clubbing  Vessels: Symmetric bilaterally  Neurologic: Alert and oriented; speech intact; face symmetrical; moves all extremities well; CNII-XII intact without focal deficit   Assessment:  1. Pedal edema   2. Anemia associated with acute blood loss     Plan:  1. Check CBC, CMP, BNP; update CXR; start Lasix 20 mg- may take bid prn until swelling resolves; follow-up to be determined. 2. Check ferritin, TIBC, iron; will most likely need to re-start iron supplement.   No follow-ups on file.  Orders Placed This Encounter  Procedures  . DG Chest 2 View    Standing Status:   Future    Standing Expiration Date:   10/22/2019    Order Specific Question:   Reason for Exam (SYMPTOM  OR DIAGNOSIS REQUIRED)    Answer:   pedal edema    Order Specific Question:   Preferred imaging location?    Answer:   [facility]    Order Specific Question:   Radiology Contrast Protocol - do NOT remove file path    Answer:   [email or web address]  . CBC w/Diff    Standing Status:   Future     Number of Occurrences:   1    Standing Expiration Date:   08/22/2019  . Comp Met (CMET)    Standing Status:   Future    Number of Occurrences:   1    Standing Expiration Date:   08/22/2019  . B Nat Peptide    Standing Status:   Future    Number of Occurrences:   1    Standing Expiration Date:   08/22/2019  . Iron, TIBC and Ferritin Panel    Standing Status:   Future    Number of Occurrences:   1    Standing Expiration Date:   08/22/2019    Requested Prescriptions   Signed Prescriptions Disp Refills  . furosemide (LASIX)  20 MG tablet 60 tablet 0    Sig: Take 1 tablet (20 mg total) by mouth 2 (two) times daily as needed for fluid or edema.  . potassium chloride SA (K-DUR) 20 MEQ tablet 30 tablet 0    Sig: Take 1 tablet (20 mEq total) by mouth 2 (two) times daily as needed.

## 2018-08-23 LAB — IRON,TIBC AND FERRITIN PANEL
%SAT: 11 % (calc) — ABNORMAL LOW (ref 16–45)
Ferritin: 27 ng/mL (ref 16–288)
Iron: 44 ug/dL — ABNORMAL LOW (ref 45–160)
TIBC: 399 mcg/dL (calc) (ref 250–450)

## 2018-08-24 ENCOUNTER — Other Ambulatory Visit: Payer: Self-pay | Admitting: Family

## 2018-08-24 DIAGNOSIS — R600 Localized edema: Secondary | ICD-10-CM

## 2018-08-24 DIAGNOSIS — R7989 Other specified abnormal findings of blood chemistry: Secondary | ICD-10-CM

## 2018-08-28 ENCOUNTER — Other Ambulatory Visit: Payer: Self-pay

## 2018-08-28 ENCOUNTER — Other Ambulatory Visit: Payer: Self-pay | Admitting: Internal Medicine

## 2018-08-28 ENCOUNTER — Ambulatory Visit (INDEPENDENT_AMBULATORY_CARE_PROVIDER_SITE_OTHER): Payer: Medicare Other | Admitting: Internal Medicine

## 2018-08-28 ENCOUNTER — Encounter: Payer: Self-pay | Admitting: Internal Medicine

## 2018-08-28 VITALS — BP 162/78 | HR 86 | Temp 97.8°F | Ht 66.0 in | Wt 178.0 lb

## 2018-08-28 DIAGNOSIS — I10 Essential (primary) hypertension: Secondary | ICD-10-CM | POA: Diagnosis not present

## 2018-08-28 DIAGNOSIS — R600 Localized edema: Secondary | ICD-10-CM | POA: Diagnosis not present

## 2018-08-28 DIAGNOSIS — I4819 Other persistent atrial fibrillation: Secondary | ICD-10-CM | POA: Diagnosis not present

## 2018-08-28 MED ORDER — TORSEMIDE 20 MG PO TABS: 20.0000 mg | ORAL_TABLET | Freq: Every day | ORAL | 0 refills | Status: DC

## 2018-08-28 NOTE — Patient Instructions (Signed)

## 2018-08-28 NOTE — Progress Notes (Signed)
Subjective:  Patient ID: Danielle Frazier, female    DOB: [birth year]  Age: 85 y.o. MRN: [id number]  CC: Foot Swelling (Bilateral)   HPI Danielle Frazier presents for f/up - She continues to complain of lower extremity edema and mild dyspnea on exertion.  She has not gotten much benefit with furosemide.  She denies chest pain, palpitations, edema, diaphoresis, dizziness, or lightheadedness.  She has been referred to cardiology.  Outpatient Medications Prior to Visit  Medication Sig Dispense Refill  . acetaminophen (TYLENOL) 650 MG suppository Place 1 suppository (650 mg total) rectally every 6 (six) hours as needed for mild pain (or Fever >/= 101). 12 suppository 0  . Blood Glucose Monitoring Suppl (BAYER CONTOUR MONITOR) W/DEVICE KIT 1 Act by Does not apply route 2 (two) times daily. 100 kit 0  . BYSTOLIC 5 MG tablet TAKE 1 TABLET BY MOUTH DAILY (Patient taking differently: Take 5 mg by mouth daily. ) 90 tablet 1  . Cholecalciferol (VITAMIN D) 50 MCG (2000 UT) tablet Take 2,000 Units by mouth daily.    . fluticasone (FLONASE) 50 MCG/ACT nasal spray Place 2 sprays into both nostrils daily. (Patient taking differently: Place 2 sprays into both nostrils 2 (two) times daily. ) 16 g 6  . glucose blood (BAYER CONTOUR TEST) test strip Use BID 100 each 11  . Iron-FA-B Cmp-C-Biot-Probiotic (FUSION PLUS) CAPS Take 1 capsule by mouth daily. 90 capsule 1  . linaclotide (LINZESS) 145 MCG CAPS capsule Take 1 capsule (145 mcg total) by mouth daily. 90 capsule 1  . Multiple Vitamins-Minerals (CENTRUM SILVER PO) Take 1 tablet by mouth daily.      Marland Kitchen omeprazole (PRILOSEC) 40 MG capsule Take 1 capsule (40 mg total) by mouth daily. 90 capsule 1  . ondansetron (ZOFRAN) 4 MG tablet Take 1 tablet (4 mg total) by mouth every 6 (six) hours as needed for nausea. 20 tablet 0  . potassium chloride SA (K-DUR) 20 MEQ tablet Take 1 tablet (20 mEq total) by mouth 2 (two) times daily as needed. 30 tablet 0  . Suvorexant  (BELSOMRA) 15 MG TABS Take 1 tablet by mouth daily as needed. 90 tablet 1  . allopurinol (ZYLOPRIM) 300 MG tablet TAKE 1/2 TABLET BY MOUTH DAILY (Patient taking differently: Take 150 mg by mouth daily. ) 45 tablet 1  . furosemide (LASIX) 20 MG tablet Take 1 tablet (20 mg total) by mouth 2 (two) times daily as needed for fluid or edema. 60 tablet 0  . LINZESS 145 MCG CAPS capsule     . lip balm (CARMEX) ointment Apply 1 application topically as needed for lip care (cold sores). 7 g 0   No facility-administered medications prior to visit.     ROS Review of Systems  Constitutional: Negative for diaphoresis, fatigue and unexpected weight change.  HENT: Negative.   Respiratory: Positive for shortness of breath. Negative for chest tightness.   Cardiovascular: Positive for leg swelling. Negative for chest pain and palpitations.  Gastrointestinal: Negative for abdominal pain, diarrhea, nausea and vomiting.  Endocrine: Negative.   Genitourinary: Negative.  Negative for difficulty urinating and dysuria.  Musculoskeletal: Negative.  Negative for arthralgias and myalgias.  Skin: Negative.  Negative for color change.  Neurological: Negative.  Negative for dizziness, weakness and light-headedness.  Hematological: Negative for adenopathy. Does not bruise/bleed easily.  Psychiatric/Behavioral: Negative.     Objective:  BP (!) 162/78 (BP Location: Left Arm, Patient Position: Sitting, Cuff Size: Normal)   Pulse 86 Comment: Irregular  Temp 97.8 F (36.6 C) (Oral)   Ht _0  (1.676 m)   Wt 178 lb (80.7 kg)   SpO2 97%   BMI 28.73 kg/m   BP Readings from Last 3 Encounters:  08/28/18 (!) 162/78  08/22/18 (!) 158/84  08/09/18 (!) 154/72    Wt Readings from Last 3 Encounters:  08/28/18 178 lb (80.7 kg)  08/22/18 183 lb (83 kg)  08/09/18 182 lb (82.6 kg)    Physical Exam Vitals signs reviewed.  Constitutional:      Appearance: She is obese. She is not ill-appearing or diaphoretic.  HENT:      Nose: Nose normal. No rhinorrhea.     Mouth/Throat:     Mouth: Mucous membranes are moist.  Eyes:     General: No scleral icterus.    Conjunctiva/sclera: Conjunctivae normal.  Neck:     Musculoskeletal: Normal range of motion. No neck rigidity.  Cardiovascular:     Rate and Rhythm: Normal rate. Rhythm irregularly irregular.     Heart sounds: No murmur. No gallop. No S3 or S4 sounds.   Pulmonary:     Effort: Pulmonary effort is normal. No respiratory distress.     Breath sounds: No stridor. No wheezing, rhonchi or rales.  Abdominal:     General: Abdomen is protuberant. Bowel sounds are normal.     Palpations: There is no hepatomegaly or splenomegaly.     Tenderness: There is no abdominal tenderness.  Musculoskeletal:        General: No swelling.     Right lower leg: 1+ Pitting Edema present.     Left lower leg: 1+ Pitting Edema present.  Lymphadenopathy:     Cervical: No cervical adenopathy.  Skin:    General: Skin is warm and dry.  Neurological:     General: No focal deficit present.     Mental Status: She is oriented to person, place, and time. Mental status is at baseline.  Psychiatric:        Mood and Affect: Mood normal.        Behavior: Behavior normal.     Lab Results  Component Value Date   WBC 6.5 08/22/2018   HGB 10.3 (L) 08/22/2018   HCT 32.1 (L) 08/22/2018   PLT 216.0 08/22/2018   GLUCOSE 107 (H) 08/22/2018   CHOL 146 07/05/2017   TRIG 188.0 (H) 07/05/2017   HDL 43.60 07/05/2017   LDLDIRECT 90.0 07/06/2015   LDLCALC 65 07/05/2017   ALT 11 08/22/2018   AST 16 08/22/2018   NA 142 08/22/2018   K 4.1 08/22/2018   CL 106 08/22/2018   CREATININE 2.81 (H) 08/22/2018   BUN 40 (H) 08/22/2018   CO2 26 08/22/2018   TSH 2.96 07/05/2017   INR 1.1 (H) 06/05/2018   HGBA1C 6.0 06/04/2018   MICROALBUR 155.7 (H) 01/08/2018    Dg Chest 2 View  Result Date: 08/22/2018 CLINICAL DATA:  Bilateral pedal edema for 2 weeks. EXAM: CHEST - 2 VIEW COMPARISON:  April 24, 2013 FINDINGS: Cardiomegaly. The hila and mediastinum are normal. No pneumothorax. No pulmonary nodules or masses. No focal infiltrates or overt edema. IMPRESSION: Cardiomegaly.  No overt edema. Electronically Signed   By: Dorise Bullion III M.D   On: 08/22/2018 15:29    Assessment & Plan:   Tomeko was seen today for foot swelling.  Diagnoses and all orders for this visit:  Persistent atrial fibrillation- She has relatively good rate control but not rhythm control.  I am concerned  this may be contributing to her symptoms.  She has been referred to cardiology.  Essential hypertension- Her blood pressure is still too high and she has lower extremity edema.  I have asked her to switch from furosemide to torsemide which is only dosed once a day and has better bioavailability. -     torsemide (DEMADEX) 20 MG tablet; Take 1 tablet (20 mg total) by mouth daily.  Localized edema- See above -     torsemide (DEMADEX) 20 MG tablet; Take 1 tablet (20 mg total) by mouth daily.   I have discontinued Danielle Frazier's lip balm and furosemide. I am also having her start on torsemide. Additionally, I am having her maintain her Multiple Vitamins-Minerals (CENTRUM SILVER PO), Bayer Contour Monitor, glucose blood, fluticasone, Bystolic, omeprazole, Vitamin D, acetaminophen, ondansetron, Fusion Plus, linaclotide, Suvorexant, and potassium chloride SA.  Meds ordered this encounter  Medications  . torsemide (DEMADEX) 20 MG tablet    Sig: Take 1 tablet (20 mg total) by mouth daily.    Dispense:  90 tablet    Refill:  0     Follow-up: Return in about 3 months (around 11/28/2018).  [name], MD

## 2018-09-13 ENCOUNTER — Other Ambulatory Visit: Payer: Self-pay | Admitting: Family

## 2018-09-18 ENCOUNTER — Other Ambulatory Visit: Payer: Self-pay | Admitting: Family

## 2018-09-20 ENCOUNTER — Encounter: Payer: Self-pay | Admitting: Family Medicine

## 2018-09-20 ENCOUNTER — Ambulatory Visit: Payer: Medicare Other | Admitting: Family Medicine

## 2018-09-20 ENCOUNTER — Ambulatory Visit: Payer: Self-pay

## 2018-09-20 ENCOUNTER — Other Ambulatory Visit: Payer: Self-pay

## 2018-09-20 VITALS — BP 140/70 | HR 77 | Ht 66.0 in | Wt 177.0 lb

## 2018-09-20 DIAGNOSIS — M7061 Trochanteric bursitis, right hip: Secondary | ICD-10-CM

## 2018-09-20 DIAGNOSIS — M25551 Pain in right hip: Secondary | ICD-10-CM | POA: Diagnosis not present

## 2018-09-20 DIAGNOSIS — M5416 Radiculopathy, lumbar region: Secondary | ICD-10-CM | POA: Diagnosis not present

## 2018-09-20 NOTE — Assessment & Plan Note (Addendum)
Attempted injection again.  Discussed icing regimen and home exercises.  I believe that this is more secondary to the spinal stenosis and nerve impingement.  We will have patient have an epidural again which I think will be more beneficial.  Last one was in November 2019.  Patient then will follow-up in 2 weeks after the epidural.  Will call if any worsening pain.  Patient does have significant antalgic gait 2 and concern for some of the underlying arthritic changes of the hip that may need to be further evaluated as well.  Patient denies any significant groin pain that seems to stop her from activity and seems to be more intermittent unlike the pain on the lateral aspect of the hip going down the leg.

## 2018-09-20 NOTE — Patient Instructions (Signed)
Good to see you Follow up with me 2 weeks after epidural

## 2018-09-20 NOTE — Assessment & Plan Note (Signed)
Encourage repeat epidural at this time.

## 2018-09-20 NOTE — Progress Notes (Signed)
[facility] [address], [place] [address] Phone: [phone] Subjective:   [name] am serving as a Education administrator for Dr. Hulan Saas.   CC: right hip pain  f/u [facility]:[id number]   08/09/2018 Patient given an injection in the gluteal area.  I do believe more secondary to the lumbar stenosis is likely contributing.  Encouraged her to consider another epidural.  Follow-up again in 3 weeks  09/20/2018 Danielle Frazier is a 85 y.o. female coming in with complaint of right hip pain. Hip is still painful and doesn't seem like it has made much progress.  Patient still feels that the pain seems to be more on the lateral aspect of the hip.  More in the buttocks area as well.  Radiates down the leg.  Patient's most recent x-rays of the hip only showed mild osteoarthritic changes.  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  Known degenerative disc disease with spinal stenosis.    Past Medical History:  Diagnosis Date  . Diabetes mellitus    type II  . Gout   . Hyperlipidemia   . Hypertension   . LBP (low back pain)    foraminal narrowing at L4-5 and disc bulge at L5- S1 on MRI 6-08  . Renal insufficiency   . Urinary incontinence    Past Surgical History:  Procedure Laterality Date  . ESOPHAGOGASTRODUODENOSCOPY (EGD) WITH PROPOFOL N/A 06/08/2018   Procedure: ESOPHAGOGASTRODUODENOSCOPY (EGD) WITH PROPOFOL;  Surgeon: [name], MD;  Location: [facility];  Service: Gastroenterology;  Laterality: N/A;   Social History   Socioeconomic History  . Marital status: Married    Spouse name: Not on file  . Number of children: Not on file  . Years of education: Not on file  . Highest education level: Not on file  Occupational History  . Not on file  Social Needs  . Financial resource strain: Not on file  . Food insecurity    Worry: Not  on file    Inability: Not on file  . Transportation needs    Medical: Not on file    Non-medical: Not on file  Tobacco Use  . Smoking status: Never Smoker  . Smokeless tobacco: Never Used  Substance and Sexual Activity  . Alcohol use: No  . Drug use: No  . Sexual activity: Not Currently  Lifestyle  . Physical activity    Days per week: Not on file    Minutes per session: Not on file  . Stress: Not on file  Relationships  . Social Herbalist on phone: Not on file    Gets together: Not on file    Attends religious service: Not on file    Active member of club or organization: Not on file    Attends meetings of clubs or organizations: Not on file    Relationship status: Not on file  Other Topics Concern  . Not on file  Social History Narrative  . Not on file   Allergies  Allergen Reactions  . Crestor [Rosuvastatin]     Muscle aches  . Metformin     REACTION: renal damage   Family History  Problem Relation Age of Onset  . Diabetes Other   . Hypertension Other      Current Outpatient Medications (Cardiovascular):  .  BYSTOLIC 5 MG  tablet, TAKE 1 TABLET BY MOUTH DAILY (Patient taking differently: Take 5 mg by mouth daily. ) .  torsemide (DEMADEX) 20 MG tablet, Take 1 tablet (20 mg total) by mouth daily.  Current Outpatient Medications (Respiratory):  .  fluticasone (FLONASE) 50 MCG/ACT nasal spray, Place 2 sprays into both nostrils daily. (Patient taking differently: Place 2 sprays into both nostrils 2 (two) times daily. )  Current Outpatient Medications (Analgesics):  .  acetaminophen (TYLENOL) 650 MG suppository, Place 1 suppository (650 mg total) rectally every 6 (six) hours as needed for mild pain (or Fever >/= 101). Marland Kitchen  allopurinol (ZYLOPRIM) 300 MG tablet, TAKE 1/2 TABLET BY MOUTH DAILY  Current Outpatient Medications (Hematological):  Marland Kitchen  Iron-FA-B Cmp-C-Biot-Probiotic (FUSION PLUS) CAPS, Take 1 capsule by mouth daily.  Current Outpatient Medications  (Other):  .  Blood Glucose Monitoring Suppl (BAYER CONTOUR MONITOR) W/DEVICE KIT, 1 Act by Does not apply route 2 (two) times daily. .  Cholecalciferol (VITAMIN D) 50 MCG (2000 UT) tablet, Take 2,000 Units by mouth daily. Marland Kitchen  glucose blood (BAYER CONTOUR TEST) test strip, Use BID .  linaclotide (LINZESS) 145 MCG CAPS capsule, Take 1 capsule (145 mcg total) by mouth daily. .  Multiple Vitamins-Minerals (CENTRUM SILVER PO), Take 1 tablet by mouth daily.   Marland Kitchen  omeprazole (PRILOSEC) 40 MG capsule, Take 1 capsule (40 mg total) by mouth daily. .  ondansetron (ZOFRAN) 4 MG tablet, Take 1 tablet (4 mg total) by mouth every 6 (six) hours as needed for nausea. .  potassium chloride SA (K-DUR) 20 MEQ tablet, Take 1 tablet (20 mEq total) by mouth 2 (two) times daily as needed. .  Suvorexant (BELSOMRA) 15 MG TABS, Take 1 tablet by mouth daily as needed.    Past medical history, social, surgical and family history all reviewed in electronic medical record.  No pertanent information unless stated regarding to the chief complaint.   Review of Systems:  No headache, visual changes, nausea, vomiting, diarrhea, constipation, dizziness, abdominal pain, skin rash, fevers, chills, night sweats, weight loss, swollen lymph nodes, body aches, joint swelling,  chest pain, shortness of breath, mood changes.  Positive muscle aches  Objective  Blood pressure 140/70, pulse 77, height '5\' 6"'  (1.676 m), weight 177 lb (80.3 kg), SpO2 91 %.   General: No apparent distress alert and oriented x3 mood and affect normal, dressed appropriately.  HEENT: Pupils equal, extraocular movements intact  Respiratory: Patient's speak in full sentences and does not appear short of breath  Cardiovascular: No lower extremity edema, non tender, no erythema  Skin: Significant for mild bruising noted Abdomen: Soft nontender  Neuro: Cranial nerves II through XII are intact, neurovascularly intact in all extremities with 2+ DTRs and 2+ pulses.   Lymph: No lymphadenopathy of posterior or anterior cervical chain or axillae bilaterally.  Gait severely antalgic MSK:  tender with full range of motion and good stability and symmetric strength and tone of shoulders, elbows, wrist,  knee and ankles bilaterally.  Right hip exam still shows severe tenderness to palpation over the greater trochanteric area.  Patient does have limited range of motion in all planes.  Minimal pain in the groin area.  Negative fulcrum test.  Positive straight leg test of the back but with radicular symptoms in the L4-L5 distribution.  4-5 strength in lower extremity compared to the contralateral side   Procedure: Real-time Ultrasound Guided Injection of right greater trochanteric bursitis secondary to patient's body habitus Device: GE Logiq Q7 Ultrasound guided injection is  preferred based studies that show increased duration, increased effect, greater accuracy, decreased procedural pain, increased response rate, and decreased cost with ultrasound guided versus blind injection.  Verbal informed consent obtained.  Time-out conducted.  Noted no overlying erythema, induration, or other signs of local infection.  Skin prepped in a sterile fashion.  Local anesthesia: Topical Ethyl chloride.  With sterile technique and under real time ultrasound guidance:  Greater trochanteric area was visualized and patient's bursa was noted. A 22-gauge 3 inch needle was inserted and 4 cc of 0.5% Marcaine and 1 cc of Kenalog 40 mg/dL was injected. Pictures taken Completed without difficulty  Pain immediately resolved suggesting accurate placement of the medication.  Advised to call if fevers/chills, erythema, induration, drainage, or persistent bleeding.  Images permanently stored and available for review in the ultrasound unit.  Impression: Technically successful ultrasound guided injection.    Impression and Recommendations:     This case required medical decision making of moderate  complexity. The above documentation has been reviewed and is accurate and complete Lyndal Pulley, DO       Note: This dictation was prepared with Dragon dictation along with smaller phrase technology. Any transcriptional errors that result from this process are unintentional.

## 2018-10-11 ENCOUNTER — Ambulatory Visit
Admission: RE | Admit: 2018-10-11 | Discharge: 2018-10-11 | Disposition: A | Discharge status: Home / Self Care | Payer: Medicare Other | Source: Ambulatory Visit | Attending: Family Medicine | Admitting: Family Medicine

## 2018-10-11 DIAGNOSIS — M7061 Trochanteric bursitis, right hip: Secondary | ICD-10-CM

## 2018-10-11 DIAGNOSIS — M545 Low back pain: Secondary | ICD-10-CM | POA: Diagnosis not present

## 2018-10-11 MED ORDER — METHYLPREDNISOLONE ACETATE 40 MG/ML INJ SUSP (RADIOLOG
120.0000 mg | Freq: Once | INTRAMUSCULAR | Status: AC
  Administered 2018-10-11: 120 mg via EPIDURAL

## 2018-10-11 MED ORDER — IOPAMIDOL (ISOVUE-M 200) INJECTION 41%
1.0000 mL | Freq: Once | INTRAMUSCULAR | Status: AC
  Administered 2018-10-11: 10:00:00 1 mL via EPIDURAL

## 2018-10-11 NOTE — Discharge Instructions (Signed)

## 2018-10-23 ENCOUNTER — Other Ambulatory Visit: Payer: Self-pay | Admitting: Internal Medicine

## 2018-10-23 ENCOUNTER — Telehealth: Payer: Self-pay | Admitting: Family

## 2018-10-23 DIAGNOSIS — I10 Essential (primary) hypertension: Secondary | ICD-10-CM

## 2018-10-23 MED ORDER — POTASSIUM CHLORIDE CRYS ER 20 MEQ PO TBCR: 20.0000 meq | EXTENDED_RELEASE_TABLET | Freq: Every day | ORAL | 1 refills | Status: DC

## 2018-10-23 NOTE — Telephone Encounter (Signed)
Pt called and stated that medication was denied. Pt is unsure what she needs to do. Please advise

## 2018-10-23 NOTE — Telephone Encounter (Signed)
rf request for potassium was denied by Valere Dross (sent to wrong provider).   Okay to refill.

## 2018-10-27 ENCOUNTER — Other Ambulatory Visit: Payer: Self-pay | Admitting: Internal Medicine

## 2018-11-12 ENCOUNTER — Encounter: Payer: Self-pay | Admitting: Internal Medicine

## 2018-11-12 ENCOUNTER — Other Ambulatory Visit (INDEPENDENT_AMBULATORY_CARE_PROVIDER_SITE_OTHER): Payer: Medicare Other

## 2018-11-12 ENCOUNTER — Other Ambulatory Visit: Payer: Self-pay

## 2018-11-12 ENCOUNTER — Ambulatory Visit (INDEPENDENT_AMBULATORY_CARE_PROVIDER_SITE_OTHER): Payer: Medicare Other | Admitting: Internal Medicine

## 2018-11-12 VITALS — BP 180/102 | HR 66 | Temp 98.2°F | Resp 16 | Ht 66.0 in | Wt 174.0 lb

## 2018-11-12 DIAGNOSIS — Z23 Encounter for immunization: Secondary | ICD-10-CM

## 2018-11-12 DIAGNOSIS — I10 Essential (primary) hypertension: Secondary | ICD-10-CM

## 2018-11-12 DIAGNOSIS — E785 Hyperlipidemia, unspecified: Secondary | ICD-10-CM | POA: Diagnosis not present

## 2018-11-12 DIAGNOSIS — N185 Chronic kidney disease, stage 5: Secondary | ICD-10-CM

## 2018-11-12 DIAGNOSIS — D62 Acute posthemorrhagic anemia: Secondary | ICD-10-CM

## 2018-11-12 DIAGNOSIS — Z0000 Encounter for general adult medical examination without abnormal findings: Secondary | ICD-10-CM

## 2018-11-12 LAB — CBC WITH DIFFERENTIAL/PLATELET
Basophils Absolute: 0 10*3/uL (ref 0.0–0.1)
Basophils Relative: 0.8 % (ref 0.0–3.0)
Eosinophils Absolute: 0.1 10*3/uL (ref 0.0–0.7)
Eosinophils Relative: 2.3 % (ref 0.0–5.0)
HCT: 36.5 % (ref 36.0–46.0)
Hemoglobin: 11.4 g/dL — ABNORMAL LOW (ref 12.0–15.0)
Lymphocytes Relative: 28.5 % (ref 12.0–46.0)
Lymphs Abs: 1.7 10*3/uL (ref 0.7–4.0)
MCHC: 31.1 g/dL (ref 30.0–36.0)
MCV: 95.9 fl (ref 78.0–100.0)
Monocytes Absolute: 0.5 10*3/uL (ref 0.1–1.0)
Monocytes Relative: 8.2 % (ref 3.0–12.0)
Neutro Abs: 3.6 10*3/uL (ref 1.4–7.7)
Neutrophils Relative %: 60.2 % (ref 43.0–77.0)
Platelets: 185 10*3/uL (ref 150.0–400.0)
RBC: 3.8 Mil/uL — ABNORMAL LOW (ref 3.87–5.11)
RDW: 19.1 % — ABNORMAL HIGH (ref 11.5–15.5)
WBC: 5.9 10*3/uL (ref 4.0–10.5)

## 2018-11-12 LAB — BASIC METABOLIC PANEL
BUN: 51 mg/dL — ABNORMAL HIGH (ref 6–23)
CO2: 25 mEq/L (ref 19–32)
Calcium: 10 mg/dL (ref 8.4–10.5)
Chloride: 107 mEq/L (ref 96–112)
Creatinine, Ser: 3.79 mg/dL — ABNORMAL HIGH (ref 0.40–1.20)
GFR: 11.31 mL/min — CL (ref >60.00)
Glucose, Bld: 93 mg/dL (ref 70–99)
Potassium: 4 mEq/L (ref 3.5–5.1)
Sodium: 143 mEq/L (ref 135–145)

## 2018-11-12 LAB — LIPID PANEL
Cholesterol: 154 mg/dL (ref 0–200)
HDL: 52.2 mg/dL (ref >39.00)
LDL Cholesterol: 78 mg/dL (ref 0–99)
NonHDL: 102.24
Total CHOL/HDL Ratio: 3
Triglycerides: 123 mg/dL (ref 0.0–149.0)
VLDL: 24.6 mg/dL (ref 0.0–40.0)

## 2018-11-12 LAB — IBC PANEL
Iron: 98 ug/dL (ref 42–145)
Saturation Ratios: 25.5 % (ref 20.0–50.0)
Transferrin: 274 mg/dL (ref 212.0–360.0)

## 2018-11-12 LAB — FERRITIN: Ferritin: 17.3 ng/mL (ref 10.0–291.0)

## 2018-11-12 MED ORDER — INDAPAMIDE 1.25 MG PO TABS: 1.2500 mg | ORAL_TABLET | Freq: Every day | ORAL | 0 refills | Status: DC

## 2018-11-12 NOTE — Progress Notes (Signed)
Subjective:  Patient ID: Danielle Frazier, female    DOB: Apr 22, 1930  Age: 85 y.o. MRN: [id number]  CC: Annual Exam, Anemia, Hypertension, and Hyperlipidemia   HPI Danielle Frazier presents for a CPX.  She complains that her blood pressure is not well controlled and she is having dizzy spells.  She tells me she is compliant with nebivolol but is no longer taking torsemide.  Outpatient Medications Prior to Visit  Medication Sig Dispense Refill  . acetaminophen (TYLENOL) 650 MG suppository Place 1 suppository (650 mg total) rectally every 6 (six) hours as needed for mild pain (or Fever >/= 101). 12 suppository 0  . allopurinol (ZYLOPRIM) 300 MG tablet TAKE 1/2 TABLET BY MOUTH DAILY 45 tablet 1  . Blood Glucose Monitoring Suppl (BAYER CONTOUR MONITOR) W/DEVICE KIT 1 Act by Does not apply route 2 (two) times daily. 100 kit 0  . Cholecalciferol (VITAMIN D) 50 MCG (2000 UT) tablet Take 2,000 Units by mouth daily.    . fluticasone (FLONASE) 50 MCG/ACT nasal spray Place 2 sprays into both nostrils daily. (Patient taking differently: Place 2 sprays into both nostrils 2 (two) times daily. ) 16 g 6  . glucose blood (BAYER CONTOUR TEST) test strip Use BID 100 each 11  . linaclotide (LINZESS) 145 MCG CAPS capsule Take 1 capsule (145 mcg total) by mouth daily. 90 capsule 1  . nebivolol (BYSTOLIC) 5 MG tablet Take 1 tablet (5 mg total) by mouth daily. 90 tablet 1  . omeprazole (PRILOSEC) 40 MG capsule Take 1 capsule (40 mg total) by mouth daily. 90 capsule 1  . potassium chloride SA (K-DUR) 20 MEQ tablet Take 1 tablet (20 mEq total) by mouth daily. 90 tablet 1  . ondansetron (ZOFRAN) 4 MG tablet Take 1 tablet (4 mg total) by mouth every 6 (six) hours as needed for nausea. 20 tablet 0  . Multiple Vitamins-Minerals (CENTRUM SILVER PO) Take 1 tablet by mouth daily.      . Iron-FA-B Cmp-C-Biot-Probiotic (FUSION PLUS) CAPS Take 1 capsule by mouth daily. (Patient not taking: Reported on 11/12/2018) 90 capsule 1  .  Suvorexant (BELSOMRA) 15 MG TABS Take 1 tablet by mouth daily as needed. (Patient not taking: Reported on 11/12/2018) 90 tablet 1  . torsemide (DEMADEX) 20 MG tablet Take 1 tablet (20 mg total) by mouth daily. 90 tablet 0   No facility-administered medications prior to visit.     ROS Review of Systems  Constitutional: Negative.  Negative for diaphoresis and fatigue.  HENT: Negative.   Eyes: Negative for visual disturbance.  Respiratory: Negative for cough, chest tightness, shortness of breath and wheezing.   Cardiovascular: Negative for chest pain, palpitations and leg swelling.  Gastrointestinal: Negative for abdominal pain, anal bleeding, blood in stool, constipation, diarrhea, nausea and vomiting.  Endocrine: Negative.   Genitourinary: Negative.  Negative for decreased urine volume, difficulty urinating and hematuria.  Musculoskeletal: Negative for arthralgias and neck stiffness.  Skin: Negative.   Neurological: Positive for dizziness. Negative for weakness, light-headedness, numbness and headaches.  Hematological: Negative for adenopathy. Does not bruise/bleed easily.  Psychiatric/Behavioral: Negative.     Objective:  BP (!) 180/102 (BP Location: Left Arm, Patient Position: Sitting, Cuff Size: Normal)   Pulse 66   Temp 98.2 F (36.8 C) (Oral)   Resp 16   Ht '5\' 6"'  (1.676 m)   Wt 174 lb (78.9 kg)   HC 16" (40.6 cm)   SpO2 99%   BMI 28.08 kg/m   BP Readings from  Last 3 Encounters:  11/12/18 (!) 180/102  10/11/18 (!) 193/86  09/20/18 140/70    Wt Readings from Last 3 Encounters:  11/12/18 174 lb (78.9 kg)  09/20/18 177 lb (80.3 kg)  08/28/18 178 lb (80.7 kg)    Physical Exam Vitals signs reviewed.  Constitutional:      Appearance: Normal appearance.  HENT:     Nose: Nose normal.     Mouth/Throat:     Mouth: Mucous membranes are moist.  Eyes:     General: No scleral icterus.    Conjunctiva/sclera: Conjunctivae normal.  Neck:     Musculoskeletal: Normal range  of motion. No muscular tenderness.  Cardiovascular:     Rate and Rhythm: Normal rate. Rhythm irregularly irregular.     Heart sounds: Normal heart sounds.  Pulmonary:     Effort: Pulmonary effort is normal.     Breath sounds: No stridor. No wheezing, rhonchi or rales.  Abdominal:     General: Abdomen is flat. Bowel sounds are normal. There is no distension.     Palpations: Abdomen is soft. There is no hepatomegaly or splenomegaly.     Tenderness: There is no abdominal tenderness.  Genitourinary:    Comments: Breast, GU, rectal exams were deferred at her request. Musculoskeletal: Normal range of motion.     Right lower leg: No edema.     Left lower leg: No edema.  Lymphadenopathy:     Cervical: No cervical adenopathy.  Skin:    General: Skin is warm and dry.  Neurological:     General: No focal deficit present.     Mental Status: She is alert.  Psychiatric:        Mood and Affect: Mood normal.        Behavior: Behavior normal.     Lab Results  Component Value Date   WBC 5.9 11/12/2018   HGB 11.4 (L) 11/12/2018   HCT 36.5 11/12/2018   PLT 185.0 11/12/2018   GLUCOSE 93 11/12/2018   CHOL 154 11/12/2018   TRIG 123.0 11/12/2018   HDL 52.20 11/12/2018   LDLDIRECT 90.0 07/06/2015   LDLCALC 78 11/12/2018   ALT 11 08/22/2018   AST 16 08/22/2018   NA 143 11/12/2018   K 4.0 11/12/2018   CL 107 11/12/2018   CREATININE 3.79 (H) 11/12/2018   BUN 51 (H) 11/12/2018   CO2 25 11/12/2018   TSH 2.96 07/05/2017   INR 1.1 (H) 06/05/2018   HGBA1C 6.0 06/04/2018   MICROALBUR 155.7 (H) 01/08/2018    Dg Inject Diag/thera/inc Needle/cath/plc Epi/lumb/sac W/img  Result Date: 10/11/2018 CLINICAL DATA:  Spondylosis without myelopathy. Low back and right worse than left leg pain. Only minimal improvement from the previous injection. I decided to go just above the level of stenosis today. FLUOROSCOPY TIME:  0 minutes 31 seconds. 12.05 micro gray meter squared PROCEDURE: The procedure, risks,  benefits, and alternatives were explained to the patient. Questions regarding the procedure were encouraged and answered. The patient understands and consents to the procedure. LUMBAR EPIDURAL INJECTION: An interlaminar approach was performed on right at L3-4. The overlying skin was cleansed and anesthetized. A 20 gauge epidural needle was advanced using loss-of-resistance technique. DIAGNOSTIC EPIDURAL INJECTION: Injection of Isovue-M 200 shows a good epidural pattern with spread above and below the level of needle placement, primarily on the right. No vascular opacification is seen. THERAPEUTIC EPIDURAL INJECTION: One hundred twenty mg of Depo-Medrol mixed with 3 cc 1% lidocaine were instilled. The procedure was well-tolerated, and the  patient was discharged thirty minutes following the injection in good condition. COMPLICATIONS: None IMPRESSION: Technically successful epidural injection on the right at L3-4. Electronically Signed   By: Nelson Chimes M.D.   On: 10/11/2018 10:39    Assessment & Plan:   Zarriah was seen today for annual exam, anemia, hypertension and hyperlipidemia.  Diagnoses and all orders for this visit:  Essential hypertension- Her systolic blood pressure is 180 and she is symptomatic.  I have asked her to start taking a thiazide diuretic in addition to nebivolol. -     Basic metabolic panel; Future -     indapamide (LOZOL) 1.25 MG tablet; Take 1 tablet (1.25 mg total) by mouth daily.  Anemia associated with acute blood loss- Her H&H have improved and her iron level is normal.  She is no longer losing blood.  I have asked her to continue taking the iron supplement. -     IBC panel; Future -     CBC with Differential/Platelet; Future -     Ferritin; Future  Routine general medical examination at a health care facility- Exam completed, labs reviewed, she refused a flu vaccine, she was willing to receive a Tdap booster, Pap/mammogram/colon cancer screening are not indicated at her  age.  Hyperlipidemia with target LDL less than 100- Statin therapy is not indicated. -     Lipid panel; Future  CKD (chronic kidney disease) stage 5, GFR less than 15 ml/min (HCC)- Her renal function has declined.  I have asked her to see nephrology for evaluation and treatment options. -     Basic metabolic panel; Future -     Consult to [facility] -     Ambulatory referral to Nephrology  Need for Tdap vaccination -     Tdap vaccine greater than or equal to 7yo IM   I have discontinued Kimanh C. Baysinger's ondansetron, Fusion Plus, Suvorexant, and torsemide. I am also having her start on indapamide. Additionally, I am having her maintain her Multiple Vitamins-Minerals (CENTRUM SILVER PO), Bayer Contour Monitor, glucose blood, fluticasone, omeprazole, Vitamin D, acetaminophen, linaclotide, allopurinol, potassium chloride SA, and nebivolol.  Meds ordered this encounter  Medications  . indapamide (LOZOL) 1.25 MG tablet    Sig: Take 1 tablet (1.25 mg total) by mouth daily.    Dispense:  90 tablet    Refill:  0     Follow-up: Return in about 4 weeks (around 12/10/2018).  [name], MD

## 2018-11-12 NOTE — Patient Instructions (Signed)

## 2018-11-12 NOTE — Patient Outreach (Signed)
[facility]) Care Management  11/12/2018  Danielle Frazier August 29, 1930 [id number]   Referral Date: 11/12/2018 Referral Source: MD Referral Referral Reason: Uncontrolled HTN   Outreach Attempt: spoke with patient.  She is able to verify.  Discussed with patient the reason for referral. Patient states that the doctor just changed her medication.  Discussed [facility] services and how we could support patient in controlling her blood pressure.  Patient states she does not want to agree to services until she has had time to try to see if medication will lower her blood pressure.  Explained to patient that the goal of the doctor is to have nurse support along with changing her medication.  She verbalized understanding.   Patient still waits to wait.  Advised patient that CM would call her next week to check and possibly enroll her into the program. She is agreeable.      Plan: RN CM will send letter and brochure and outreach next week per patient agreement.     Jone Baseman, RN, MSN [facility] Care Management Care Management Coordinator Direct Line [phone] Toll Free: [phone]  Fax: [phone]

## 2018-11-14 ENCOUNTER — Ambulatory Visit (INDEPENDENT_AMBULATORY_CARE_PROVIDER_SITE_OTHER): Payer: Medicare Other | Admitting: Cardiovascular Disease

## 2018-11-14 ENCOUNTER — Other Ambulatory Visit: Payer: Self-pay

## 2018-11-14 ENCOUNTER — Encounter: Payer: Self-pay | Admitting: Cardiovascular Disease

## 2018-11-14 VITALS — BP 158/76 | HR 89 | Ht 66.0 in | Wt 174.4 lb

## 2018-11-14 DIAGNOSIS — N185 Chronic kidney disease, stage 5: Secondary | ICD-10-CM | POA: Diagnosis not present

## 2018-11-14 DIAGNOSIS — I4819 Other persistent atrial fibrillation: Secondary | ICD-10-CM

## 2018-11-14 DIAGNOSIS — I4891 Unspecified atrial fibrillation: Secondary | ICD-10-CM | POA: Diagnosis not present

## 2018-11-14 DIAGNOSIS — N179 Acute kidney failure, unspecified: Secondary | ICD-10-CM | POA: Diagnosis not present

## 2018-11-14 DIAGNOSIS — N189 Chronic kidney disease, unspecified: Secondary | ICD-10-CM | POA: Diagnosis not present

## 2018-11-14 NOTE — Patient Instructions (Signed)
Medication Instructions:  Your physician has recommended you make the following change in your medication:  1.) stop torsemide 2.) stop potassium chloride 3.) start Eliquis (apixaban) 2.5 mg twice a day  If you need a refill on your cardiac medications before your next appointment, please call your pharmacy.   Lab work: Today: bmet, tsh If you have labs (blood work) drawn today and your tests are completely normal, you will receive your results only by: Marland Kitchen MyChart Message (if you have MyChart) OR . A paper copy in the mail If you have any lab test that is abnormal or we need to change your treatment, we will call you to review the results.  Testing/Procedures: Your physician has requested that you have an echocardiogram. Echocardiography is a painless test that uses sound waves to create images of your heart. It provides your doctor with information about the size and shape of your heart and how well your heart's chambers and valves are working. This procedure takes approximately one hour. There are no restrictions for this procedure.   Follow-Up: At [facility], you and your health needs are our priority.  As part of our continuing mission to provide you with exceptional heart care, we have created designated Provider Care Teams.  These Care Teams include your primary Cardiologist (physician) and Advanced Practice Providers (APPs -  Physician Assistants and Nurse Practitioners) who all work together to provide you with the care you need, when you need it. You will need a follow up appointment in:  3 months.  Please call our office 2 months in advance to schedule this appointment.  You may see Mertie Moores, MD or one of the following Advanced Practice Providers on your designated Care Team: Richardson Dopp, PA-C [place], [place] . Daune Perch, NP  Any Other Special Instructions Will Be Listed Below (If Applicable).

## 2018-11-14 NOTE — Progress Notes (Signed)
Cardiology Office Note:    Date:  11/14/2018   ID:  Danielle Frazier, DOB 09-23-1930, MRN [id number]  PCP:  [name], MD  Cardiologist:  Terria Deschepper  Electrophysiologist:  None   Referring MD: Marrian Salvage,*   Chief Complaint  Patient presents with  . Shortness of Breath    History of Present Illness:    Danielle Frazier is a 86 y.o. female with a hx of HTN and dyspnea.and a new dx of Afib  She was anemic in march and had a transfusion. She was short of breath while she was anemic.    Walks on occasion.   Around the house and to the mailbox.   ECG in A[pril shows Afib. Cannot tell tht her HR is irreg.  Able to do all of her normal activities without any significant problems.  No chest pain.   Had some leg edema sevaral months ago but was started on  Indapamide and the edema has resolved.   No significant leg edema now   No cough, fever.  No PND or orthopena  Past Medical History:  Diagnosis Date  . Abdominal tenderness, RLQ (right lower quadrant) 04/24/2013  . Acute bacterial sinusitis 03/22/2013  . Acute bronchitis 03/24/2018   1/20  . Anemia associated with acute blood loss 06/04/2018  . Bilateral renal cysts 09/07/2011  . BRBPR (bright red blood per rectum) 06/04/2018  . Cerumen impaction 03/24/2018   1/20 L  . CKD (chronic kidney disease) stage 5, GFR less than 15 ml/min (HCC) 08/25/2008   Estimated Creatinine Clearance: 12.7 mL/min (A) (by C-G formula based on SCr of 3.58 mg/dL (H)).  . Constipation, chronic 04/24/2013  . Diabetes mellitus    type II  . 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 DM (diabetes mellitus), type 2 with renal complications ([place]) 9/52/8413  . ELECTROCARDIOGRAM, ABNORMAL 08/25/2008   Qualifier: Diagnosis of  By: Ronnald Ramp MD, Arvid Right.   . Essential hypertension 08/25/2008  . Gastroesophageal reflux disease without esophagitis 07/06/2015  . GI bleeding 06/07/2018  . Gout   . GOUT 08/25/2008       . Greater trochanteric bursitis of right hip 11/07/2017   Injected November 07, 2017 repeat injection Jul 13, 2018 repeat injection September 20, 2018  . Hip pain, chronic, right 07/23/2012   Dg Hip Complete Right  07/23/2012   *RADIOLOGY REPORT*  Clinical Data: Right hip pain  RIGHT HIP - COMPLETE 2+ VIEW  Comparison: None.  Findings: No fracture or dislocation is seen.  Visualized bony pelvis appears intact.  Bilateral hip joint spaces are symmetric.  Degenerative changes of the lower lumbar spine.  IMPRESSION: No acute osseous abnormality is seen.   Original Report Authenticated By:   . Hyperlipidemia   . Hyperlipidemia with target LDL less than 100 12/30/2009  . Hypertension   . Insomnia secondary to chronic pain 07/11/2018  . LBP (low back pain)    foraminal narrowing at L4-5 and disc bulge at L5- S1 on MRI 6-08  . Lumbar radiculopathy 12/11/2017  . Morbid obesity ([place]) 03/22/2013  . Otitis media 03/24/2018   1/20 R and possibly L  . Persistent atrial fibrillation 06/07/2018  . Renal insufficiency   . Right hip pain 10/16/2017  . Shoulder injury 05/21/2012  . Urinary incontinence   . Uterine fibroid 05/16/2013  . UTI (lower urinary tract infection) 04/24/2013    Past Surgical History:  Procedure Laterality Date  . ESOPHAGOGASTRODUODENOSCOPY (EGD) WITH PROPOFOL N/A 06/08/2018   Procedure: ESOPHAGOGASTRODUODENOSCOPY (  EGD) WITH PROPOFOL;  Surgeon: [name], MD;  Location: [facility] ENDOSCOPY;  Service: Gastroenterology;  Laterality: N/A;    Current Medications: Current Meds  Medication Sig  . acetaminophen (TYLENOL) 650 MG CR tablet Take 650 mg by mouth every 8 (eight) hours as needed for pain.  Marland Kitchen allopurinol (ZYLOPRIM) 300 MG tablet TAKE 1/2 TABLET BY MOUTH DAILY  . Blood Glucose Monitoring Suppl (BAYER CONTOUR MONITOR) W/DEVICE KIT 1 Act by Does not apply route 2 (two) times daily.  . Cholecalciferol (VITAMIN D) 50 MCG (2000 UT) tablet Take 2,000 Units by mouth daily.  . fluticasone (FLONASE) 50 MCG/ACT nasal spray Place 2 sprays into both nostrils daily.   Marland Kitchen glucose blood (BAYER CONTOUR TEST) test strip Use BID  . indapamide (LOZOL) 1.25 MG tablet Take 1 tablet (1.25 mg total) by mouth daily.  Marland Kitchen linaclotide (LINZESS) 145 MCG CAPS capsule Take 1 capsule (145 mcg total) by mouth daily.  . Multiple Vitamins-Minerals (CENTRUM SILVER PO) Take 1 tablet by mouth daily.    . nebivolol (BYSTOLIC) 5 MG tablet Take 1 tablet (5 mg total) by mouth daily.  Marland Kitchen omeprazole (PRILOSEC) 40 MG capsule Take 1 capsule (40 mg total) by mouth daily.  . [DISCONTINUED] potassium chloride SA (K-DUR) 20 MEQ tablet Take 1 tablet (20 mEq total) by mouth daily.  . [DISCONTINUED] torsemide (DEMADEX) 20 MG tablet Take 20 mg by mouth daily.     Allergies:   Metformin and Crestor [rosuvastatin]   Social History   Socioeconomic History  . Marital status: Married    Spouse name: Not on file  . Number of children: Not on file  . Years of education: Not on file  . Highest education level: Not on file  Occupational History  . Not on file  Social Needs  . Financial resource strain: Not on file  . Food insecurity    Worry: Not on file    Inability: Not on file  . Transportation needs    Medical: Not on file    Non-medical: Not on file  Tobacco Use  . Smoking status: Never Smoker  . Smokeless tobacco: Never Used  Substance and Sexual Activity  . Alcohol use: No  . Drug use: No  . Sexual activity: Not Currently  Lifestyle  . Physical activity    Days per week: Not on file    Minutes per session: Not on file  . Stress: Not on file  Relationships  . Social Herbalist on phone: Not on file    Gets together: Not on file    Attends religious service: Not on file    Active member of club or organization: Not on file    Attends meetings of clubs or organizations: Not on file    Relationship status: Not on file  Other Topics Concern  . Not on file  Social History Narrative  . Not on file     Family History: The patient's family history includes Diabetes  in an other family member; Hypertension in an other family member.  ROS:   Please see the history of present illness.     All other systems reviewed and are negative.  EKGs/Labs/Other Studies Reviewed:    The following studies were reviewed today:   EKG:     Recent Labs: 08/22/2018: ALT 11; Pro B Natriuretic peptide (BNP) 1,239.0 11/12/2018: BUN 51; Creatinine, Ser 3.79; Hemoglobin 11.4; Platelets 185.0; Potassium 4.0; Sodium 143  Recent Lipid Panel    Component Value Date/Time  CHOL 154 11/12/2018 0939   TRIG 123.0 11/12/2018 0939   HDL 52.20 11/12/2018 0939   CHOLHDL 3 11/12/2018 0939   VLDL 24.6 11/12/2018 0939   LDLCALC 78 11/12/2018 0939   LDLDIRECT 90.0 07/06/2015 0957    Physical Exam:    VS:  BP (!) 158/76   Pulse 89   Ht _0  (1.676 m)   Wt 174 lb 6.4 oz (79.1 kg)   SpO2 98%   BMI 28.15 kg/m     Wt Readings from Last 3 Encounters:  11/14/18 174 lb 6.4 oz (79.1 kg)  11/12/18 174 lb (78.9 kg)  09/20/18 177 lb (80.3 kg)     GEN: elderly female,   HEENT: Normal NECK: No JVD; No carotid bruits LYMPHATICS: No lymphadenopathy CARDIAC: irreg. Irreg.  , HR is controlled.  RESPIRATORY:  Clear to auscultation without rales, wheezing or rhonchi  ABDOMEN: Soft, non-tender, non-distended MUSCULOSKELETAL:  No edema; No deformity  SKIN: extremely dry skin  NEUROLOGIC:  Alert and oriented x 3 PSYCHIATRIC:  Normal affect   ASSESSMENT:    1. Acute renal failure superimposed on chronic kidney disease, unspecified CKD stage, unspecified acute renal failure type ([place])   2. New onset atrial fibrillation (HCC)    PLAN:    In order of problems listed above:  1. Atrial fibrillation:  CHADS23VASC is 41   ( female, age, HTN) The patient was found to have new onset/newly discovered atrial fibrillation.  The atrial fibrillation actually showed up in April during an EKG.  She is completely asymptomatic from an A. fib standpoint.  She has noticed a little bit more shortness  of breath recently.  Her heart rate is well controlled.  We will start her on Eliquis 2.5 mg twice a day.  She does not need any additional rate controlling medications.  We will check a TSH and also will check an echocardiogram.  2.  Acute on chronic kidney disease: The patient has acutely elevated creatinine.  She has baseline creatinine of 2.  Her creatinine is 3.79 today.  She was started on torsemide and potassium yesterday.  We will discontinue the torsemide and potassium.  I would like to check another basic metabolic profile today to see what her potassium level is.  She has been referred to nephrology but has not made an appointment yet.  She was started on the torsemide in addition to the indapamide for leg edema.  She eats a very high salt diet.  She has lots of processed meats.  I encouraged her to stay away from all salty meats and processed meats.   Medication Adjustments/Labs and Tests Ordered: Current medicines are reviewed at length with the patient today.  Concerns regarding medicines are outlined above.  Orders Placed This Encounter  Procedures  . TSH  . Basic metabolic panel  . ECHOCARDIOGRAM COMPLETE   No orders of the defined types were placed in this encounter.   Patient Instructions  Medication Instructions:  Your physician has recommended you make the following change in your medication:  1.) stop torsemide 2.) stop potassium chloride 3.) start Eliquis (apixaban) 2.5 mg twice a day  If you need a refill on your cardiac medications before your next appointment, please call your pharmacy.   Lab work: Today: bmet, tsh If you have labs (blood work) drawn today and your tests are completely normal, you will receive your results only by: Marland Kitchen MyChart Message (if you have MyChart) OR . A paper copy in the mail If  you have any lab test that is abnormal or we need to change your treatment, we will call you to review the results.  Testing/Procedures: Your physician  has requested that you have an echocardiogram. Echocardiography is a painless test that uses sound waves to create images of your heart. It provides your doctor with information about the size and shape of your heart and how well your heart's chambers and valves are working. This procedure takes approximately one hour. There are no restrictions for this procedure.   Follow-Up: At [facility], you and your health needs are our priority.  As part of our continuing mission to provide you with exceptional heart care, we have created designated Provider Care Teams.  These Care Teams include your primary Cardiologist (physician) and Advanced Practice Providers (APPs -  Physician Assistants and Nurse Practitioners) who all work together to provide you with the care you need, when you need it. You will need a follow up appointment in:  3 months.  Please call our office 2 months in advance to schedule this appointment.  You may see Mertie Moores, MD or one of the following Advanced Practice Providers on your designated Care Team: Richardson Dopp, PA-C [place], [place] . Daune Perch, NP  Any Other Special Instructions Will Be Listed Below (If Applicable).       Signed, Mertie Moores, MD  11/14/2018 5:10 PM    [place]

## 2018-11-15 LAB — BASIC METABOLIC PANEL
BUN/Creatinine Ratio: 15 (ref 12–28)
BUN: 53 mg/dL — ABNORMAL HIGH (ref 8–27)
CO2: 22 mmol/L (ref 20–29)
Calcium: 10.3 mg/dL (ref 8.7–10.3)
Chloride: 104 mmol/L (ref 96–106)
Creatinine, Ser: 3.47 mg/dL — ABNORMAL HIGH (ref 0.57–1.00)
GFR calc Af Amer: 13 mL/min/{1.73_m2} — ABNORMAL LOW (ref >59)
GFR calc non Af Amer: 11 mL/min/{1.73_m2} — ABNORMAL LOW (ref >59)
Glucose: 91 mg/dL (ref 65–99)
Potassium: 3.9 mmol/L (ref 3.5–5.2)
Sodium: 142 mmol/L (ref 134–144)

## 2018-11-15 LAB — TSH: TSH: 4.05 u[IU]/mL (ref 0.450–4.500)

## 2018-11-16 ENCOUNTER — Telehealth: Payer: Self-pay | Admitting: Nurse Practitioner

## 2018-11-16 DIAGNOSIS — N179 Acute kidney failure, unspecified: Secondary | ICD-10-CM

## 2018-11-16 NOTE — Telephone Encounter (Signed)
Reviewed results with patient who verbalized understanding. She states she does not have an appointment with PCP or nephrology at this time so she will return to our office on 9/11 for a repeat bmet. She thanked me for the call.

## 2018-11-16 NOTE — Telephone Encounter (Signed)
-----   Message from Thayer Headings, MD sent at 11/15/2018  9:40 AM EDT ----- TSH is normal. Creatinine and BUN are elevated. I have stopped her Torsemide and Kdur Further management of her acute on chronic kidney disease per Dr. Ronnald Ramp and nephrology  She will need a repeat BMP in 1-2 weeks.

## 2018-11-20 ENCOUNTER — Other Ambulatory Visit: Payer: Self-pay | Admitting: Internal Medicine

## 2018-11-20 DIAGNOSIS — R600 Localized edema: Secondary | ICD-10-CM

## 2018-11-20 DIAGNOSIS — I10 Essential (primary) hypertension: Secondary | ICD-10-CM

## 2018-11-21 ENCOUNTER — Other Ambulatory Visit: Payer: Self-pay

## 2018-11-21 NOTE — Patient Outreach (Signed)
[facility]) Care Management  11/21/2018  Danielle Frazier 1930-12-17 [id number]   Referral Date: 11/12/2018 Referral Source: MD Referral Referral Reason: Uncontrolled HTN   Outreach Attempt: Spoke with patient again about her referral to case management for disease management support of her HTN.  She states that her blood pressure is better and that she saw the cardiologist and he also made some changes with her medications.  She feels like she is in a good place right now and still declines the additional support.    Plan: RN CM will close case.   Jone Baseman, RN, MSN [place] Management Care Management Coordinator Direct Line [phone] Cell [phone] Toll Free: [phone]  Fax: [phone]

## 2018-11-23 ENCOUNTER — Other Ambulatory Visit: Payer: Self-pay

## 2018-11-23 ENCOUNTER — Other Ambulatory Visit: Payer: Medicare Other | Admitting: *Deleted

## 2018-11-23 DIAGNOSIS — N179 Acute kidney failure, unspecified: Secondary | ICD-10-CM | POA: Diagnosis not present

## 2018-11-23 DIAGNOSIS — N189 Chronic kidney disease, unspecified: Secondary | ICD-10-CM | POA: Diagnosis not present

## 2018-11-23 LAB — BASIC METABOLIC PANEL
BUN/Creatinine Ratio: 15 (ref 12–28)
BUN: 49 mg/dL — ABNORMAL HIGH (ref 8–27)
CO2: 21 mmol/L (ref 20–29)
Calcium: 10.4 mg/dL — ABNORMAL HIGH (ref 8.7–10.3)
Chloride: 108 mmol/L — ABNORMAL HIGH (ref 96–106)
Creatinine, Ser: 3.3 mg/dL — ABNORMAL HIGH (ref 0.57–1.00)
GFR calc Af Amer: 14 mL/min/{1.73_m2} — ABNORMAL LOW (ref >59)
GFR calc non Af Amer: 12 mL/min/{1.73_m2} — ABNORMAL LOW (ref >59)
Glucose: 93 mg/dL (ref 65–99)
Potassium: 3.5 mmol/L (ref 3.5–5.2)
Sodium: 142 mmol/L (ref 134–144)

## 2018-11-29 DIAGNOSIS — I120 Hypertensive chronic kidney disease with stage 5 chronic kidney disease or end stage renal disease: Secondary | ICD-10-CM | POA: Diagnosis not present

## 2018-11-29 DIAGNOSIS — M109 Gout, unspecified: Secondary | ICD-10-CM | POA: Diagnosis not present

## 2018-11-29 DIAGNOSIS — N185 Chronic kidney disease, stage 5: Secondary | ICD-10-CM | POA: Diagnosis not present

## 2018-12-02 ENCOUNTER — Other Ambulatory Visit: Payer: Self-pay | Admitting: Internal Medicine

## 2018-12-02 DIAGNOSIS — K219 Gastro-esophageal reflux disease without esophagitis: Secondary | ICD-10-CM

## 2018-12-03 ENCOUNTER — Other Ambulatory Visit: Payer: Self-pay | Admitting: Nephrology

## 2018-12-03 ENCOUNTER — Other Ambulatory Visit: Payer: Self-pay

## 2018-12-03 ENCOUNTER — Ambulatory Visit (HOSPITAL_COMMUNITY): Payer: Medicare Other | Attending: Cardiology

## 2018-12-03 DIAGNOSIS — N189 Chronic kidney disease, unspecified: Secondary | ICD-10-CM | POA: Diagnosis not present

## 2018-12-03 DIAGNOSIS — I4891 Unspecified atrial fibrillation: Secondary | ICD-10-CM | POA: Insufficient documentation

## 2018-12-03 DIAGNOSIS — N179 Acute kidney failure, unspecified: Secondary | ICD-10-CM | POA: Diagnosis not present

## 2018-12-03 DIAGNOSIS — N185 Chronic kidney disease, stage 5: Secondary | ICD-10-CM

## 2018-12-06 ENCOUNTER — Ambulatory Visit
Admission: RE | Admit: 2018-12-06 | Discharge: 2018-12-06 | Disposition: A | Discharge status: Home / Self Care | Payer: Medicare Other | Source: Ambulatory Visit | Attending: Nephrology | Admitting: Nephrology

## 2018-12-06 DIAGNOSIS — N185 Chronic kidney disease, stage 5: Secondary | ICD-10-CM

## 2018-12-12 ENCOUNTER — Ambulatory Visit (INDEPENDENT_AMBULATORY_CARE_PROVIDER_SITE_OTHER): Payer: Medicare Other | Admitting: Internal Medicine

## 2018-12-12 ENCOUNTER — Encounter: Payer: Self-pay | Admitting: Internal Medicine

## 2018-12-12 ENCOUNTER — Other Ambulatory Visit: Payer: Self-pay

## 2018-12-12 VITALS — BP 146/82 | HR 75 | Temp 97.9°F | Resp 16 | Ht 66.0 in | Wt 175.5 lb

## 2018-12-12 DIAGNOSIS — Z23 Encounter for immunization: Secondary | ICD-10-CM

## 2018-12-12 DIAGNOSIS — I10 Essential (primary) hypertension: Secondary | ICD-10-CM

## 2018-12-12 DIAGNOSIS — I4819 Other persistent atrial fibrillation: Secondary | ICD-10-CM

## 2018-12-12 MED ORDER — APIXABAN 2.5 MG PO TABS: 2.5000 mg | ORAL_TABLET | Freq: Two times a day (BID) | ORAL | 5 refills | Status: DC

## 2018-12-12 MED ORDER — NEBIVOLOL HCL 10 MG PO TABS: 10.0000 mg | ORAL_TABLET | Freq: Every day | ORAL | 5 refills | Status: DC

## 2018-12-12 NOTE — Patient Instructions (Signed)

## 2018-12-12 NOTE — Progress Notes (Signed)
Subjective:  Patient ID: Danielle Frazier, female    DOB: 01/23/1931  Age: 85 y.o. MRN: [id number]  CC: Hypertension and Atrial Fibrillation   HPI Danielle Frazier presents for f/up - She saw a nephrologist since I last saw her.  She tells me she is scheduled for a renal ultrasound.  She was told to double the dose of Bystolic.  She has had no more episodes of lower extremity edema.  She denies chest pain, palpitations, edema, dizziness, or lightheadedness.  Outpatient Medications Prior to Visit  Medication Sig Dispense Refill   acetaminophen (TYLENOL) 650 MG CR tablet Take 650 mg by mouth every 8 (eight) hours as needed for pain.     allopurinol (ZYLOPRIM) 300 MG tablet TAKE 1/2 TABLET BY MOUTH DAILY 45 tablet 1   Blood Glucose Monitoring Suppl (BAYER CONTOUR MONITOR) W/DEVICE KIT 1 Act by Does not apply route 2 (two) times daily. 100 kit 0   Cholecalciferol (VITAMIN D) 50 MCG (2000 UT) tablet Take 2,000 Units by mouth daily.     fluticasone (FLONASE) 50 MCG/ACT nasal spray Place 2 sprays into both nostrils daily. 16 g 6   glucose blood (BAYER CONTOUR TEST) test strip Use BID 100 each 11   linaclotide (LINZESS) 145 MCG CAPS capsule Take 1 capsule (145 mcg total) by mouth daily. 90 capsule 1   Multiple Vitamins-Minerals (CENTRUM SILVER PO) Take 1 tablet by mouth daily.       omeprazole (PRILOSEC) 40 MG capsule TAKE 1 CAPSULE BY MOUTH EVERY DAY 90 capsule 1   indapamide (LOZOL) 1.25 MG tablet Take 1 tablet (1.25 mg total) by mouth daily. 90 tablet 0   nebivolol (BYSTOLIC) 5 MG tablet Take 1 tablet (5 mg total) by mouth daily. 90 tablet 1   No facility-administered medications prior to visit.     ROS Review of Systems  Constitutional: Negative for chills, diaphoresis and fatigue.  HENT: Negative.   Eyes: Negative for visual disturbance.  Respiratory: Negative for cough, chest tightness, shortness of breath and wheezing.   Cardiovascular: Negative for chest pain, palpitations and  leg swelling.  Gastrointestinal: Negative for abdominal pain, constipation, diarrhea, nausea and vomiting.  Endocrine: Negative.   Genitourinary: Negative.   Musculoskeletal: Negative.  Negative for arthralgias, joint swelling and myalgias.  Skin: Negative.   Neurological: Negative.  Negative for dizziness, weakness, light-headedness and headaches.  Hematological: Negative for adenopathy. Does not bruise/bleed easily.  Psychiatric/Behavioral: Negative.     Objective:  BP (!) 146/82 (BP Location: Left Arm, Patient Position: Sitting, Cuff Size: Normal)    Pulse 75    Temp 97.9 F (36.6 C) (Oral)    Resp 16    Ht '5\' 6"'  (1.676 m)    Wt 175 lb 8 oz (79.6 kg)    SpO2 97%    BMI 28.33 kg/m   BP Readings from Last 3 Encounters:  12/12/18 (!) 146/82  11/14/18 (!) 158/76  11/12/18 (!) 180/102    Wt Readings from Last 3 Encounters:  12/12/18 175 lb 8 oz (79.6 kg)  11/14/18 174 lb 6.4 oz (79.1 kg)  11/12/18 174 lb (78.9 kg)    Physical Exam Constitutional:      Appearance: Normal appearance.  HENT:     Nose: Nose normal.     Mouth/Throat:     Mouth: Mucous membranes are moist.  Eyes:     General: No scleral icterus.    Conjunctiva/sclera: Conjunctivae normal.  Neck:     Musculoskeletal: Normal range of  motion and neck supple.  Cardiovascular:     Rate and Rhythm: Normal rate. Rhythm irregularly irregular.     Heart sounds: No murmur.  Pulmonary:     Effort: Pulmonary effort is normal.     Breath sounds: No stridor. No wheezing, rhonchi or rales.  Abdominal:     General: Abdomen is flat. Bowel sounds are normal.     Palpations: There is no hepatomegaly or splenomegaly.  Musculoskeletal: Normal range of motion.     Right lower leg: No edema.     Left lower leg: No edema.  Lymphadenopathy:     Cervical: No cervical adenopathy.  Skin:    General: Skin is warm.  Neurological:     General: No focal deficit present.     Mental Status: She is alert.     Lab Results    Component Value Date   WBC 5.9 11/12/2018   HGB 11.4 (L) 11/12/2018   HCT 36.5 11/12/2018   PLT 185.0 11/12/2018   GLUCOSE 93 11/23/2018   CHOL 154 11/12/2018   TRIG 123.0 11/12/2018   HDL 52.20 11/12/2018   LDLDIRECT 90.0 07/06/2015   LDLCALC 78 11/12/2018   ALT 11 08/22/2018   AST 16 08/22/2018   NA 142 11/23/2018   K 3.5 11/23/2018   CL 108 (H) 11/23/2018   CREATININE 3.30 (H) 11/23/2018   BUN 49 (H) 11/23/2018   CO2 21 11/23/2018   TSH 4.050 11/14/2018   INR 1.1 (H) 06/05/2018   HGBA1C 6.0 06/04/2018   MICROALBUR 155.7 (H) 01/08/2018    US Renal  Result Date: 12/06/2018 CLINICAL DATA:  Chronic kidney disease stage 5 EXAM: RENAL / URINARY TRACT ULTRASOUND COMPLETE COMPARISON:  None. FINDINGS: Right Kidney: Renal measurements: 9.8 x 3.7 x 3.9 cm = volume: 72.9 mL. Increased parenchymal echogenicity. Diffuse renal cortical thinning. Small mass in the upper pole hypo to anechoic, 1.2 x 1.0 x 1.0 cm. Midpole cyst measuring 1.9 x 1.4 x 2.0 cm. No other masses, no stones and no hydronephrosis. Left Kidney: Renal measurements: 9.1 x 4.7 x 5.2 cm = volume: 116.1 mL. Increased parenchymal echogenicity. Diffuse renal cortical thinning. Simple cyst projects from the lower pole measuring 5.8 x 3.8 x 5.5 cm. No other masses, no stones and no hydronephrosis. Bladder: Appears normal for degree of bladder distention. IMPRESSION: 1. No acute findings.  No hydronephrosis. 2. Findings of medical renal disease with reduced renal sizes and diffuse increased renal parenchymal echogenicity. 3. 5.8 cm cyst, lower pole left kidney. 2 cm cyst, midpole the right kidney. Smaller, 1.2 cm, cystic appearing mass from the upper pole the right kidney, not fully characterized. This lesion is also likely a cyst, but could be more completely characterized with renal protocol MRI with and without contrast. Electronically Signed   By: Lajean Manes M.D.   On: 12/06/2018 16:53    Assessment & Plan:   Danielle Frazier was seen  today for hypertension and atrial fibrillation.  Diagnoses and all orders for this visit:  Need for influenza vaccination -     Flu Vaccine QUAD High Dose(Fluad)  Essential hypertension- Her blood pressure is adequately well controlled. -     nebivolol (BYSTOLIC) 10 MG tablet; Take 1 tablet (10 mg total) by mouth daily.  Persistent atrial fibrillation- She has good rate control with nebivolol.  Will continue anticoagulation with the DOAC. -     apixaban (ELIQUIS) 2.5 MG TABS tablet; Take 1 tablet (2.5 mg total) by mouth 2 (two) times daily. -  nebivolol (BYSTOLIC) 10 MG tablet; Take 1 tablet (10 mg total) by mouth daily.   I have discontinued Andee C. Blades's nebivolol and indapamide. I am also having her start on apixaban and nebivolol. Additionally, I am having her maintain her Multiple Vitamins-Minerals (CENTRUM SILVER PO), Bayer Contour Monitor, glucose blood, fluticasone, Vitamin D, linaclotide, allopurinol, acetaminophen, and omeprazole.  Meds ordered this encounter  Medications   apixaban (ELIQUIS) 2.5 MG TABS tablet    Sig: Take 1 tablet (2.5 mg total) by mouth 2 (two) times daily.    Dispense:  60 tablet    Refill:  5   nebivolol (BYSTOLIC) 10 MG tablet    Sig: Take 1 tablet (10 mg total) by mouth daily.    Dispense:  30 tablet    Refill:  5     Follow-up: Return in about 6 months (around 06/11/2019).  [name], MD

## 2019-01-14 ENCOUNTER — Telehealth: Payer: Self-pay | Admitting: Cardiovascular Disease

## 2019-01-14 NOTE — Telephone Encounter (Signed)
° ° °  1. What dental office are you calling from? Dr Seward Grater  2. What is your office phone number? [phone]  3. What is your fax number?[phone]  4. What type of procedure is the patient having performed? 1 extraction  5. What date is procedure scheduled or is the patient there now? 11/10 (if the patient is at the dentist's office question goes to their cardiologist if he/she is in the office.  If not, question should go to the DOD).   6. What is your question (ex. Antibiotics prior to procedure, holding medication-we need to know how long dentist wants pt to hold med)? Blood thinner

## 2019-01-14 NOTE — Telephone Encounter (Signed)
   Primary Cardiologist: Mertie Moores, MD  Chart reviewed as part of pre-operative protocol coverage. Simple dental extractions are considered low risk procedures per guidelines and generally do not require any specific cardiac clearance. It is also generally accepted that for simple extractions and dental cleanings, there is no need to interrupt blood thinner therapy.   SBE prophylaxis is not required for the patient.  I will route this recommendation to the requesting party via Epic fax function and remove from pre-op pool.  Please call with questions.  [place], [place] 01/14/2019, 5:48 PM

## 2019-01-25 DIAGNOSIS — I120 Hypertensive chronic kidney disease with stage 5 chronic kidney disease or end stage renal disease: Secondary | ICD-10-CM | POA: Diagnosis not present

## 2019-01-25 DIAGNOSIS — N185 Chronic kidney disease, stage 5: Secondary | ICD-10-CM | POA: Diagnosis not present

## 2019-01-25 DIAGNOSIS — M109 Gout, unspecified: Secondary | ICD-10-CM | POA: Diagnosis not present

## 2019-02-04 ENCOUNTER — Other Ambulatory Visit: Payer: Self-pay | Admitting: Internal Medicine

## 2019-02-04 DIAGNOSIS — I10 Essential (primary) hypertension: Secondary | ICD-10-CM

## 2019-02-10 ENCOUNTER — Other Ambulatory Visit: Payer: Self-pay | Admitting: Internal Medicine

## 2019-02-10 DIAGNOSIS — I10 Essential (primary) hypertension: Secondary | ICD-10-CM

## 2019-02-14 ENCOUNTER — Encounter: Payer: Self-pay | Admitting: Cardiovascular Disease

## 2019-02-14 ENCOUNTER — Ambulatory Visit (INDEPENDENT_AMBULATORY_CARE_PROVIDER_SITE_OTHER): Payer: Medicare Other | Admitting: Cardiovascular Disease

## 2019-02-14 ENCOUNTER — Other Ambulatory Visit: Payer: Self-pay

## 2019-02-14 VITALS — BP 130/95 | HR 70 | Ht 66.0 in | Wt 177.6 lb

## 2019-02-14 DIAGNOSIS — N185 Chronic kidney disease, stage 5: Secondary | ICD-10-CM | POA: Diagnosis not present

## 2019-02-14 DIAGNOSIS — I4819 Other persistent atrial fibrillation: Secondary | ICD-10-CM | POA: Diagnosis not present

## 2019-02-14 DIAGNOSIS — I10 Essential (primary) hypertension: Secondary | ICD-10-CM | POA: Diagnosis not present

## 2019-02-14 NOTE — Patient Instructions (Signed)
Medication Instructions:  Your physician recommends that you continue on your current medications as directed. Please refer to the Current Medication list given to you today.  *If you need a refill on your cardiac medications before your next appointment, please call your pharmacy*   Lab Work: None Ordered   Testing/Procedures: None Ordered   Follow-Up: At [facility], you and your health needs are our priority.  As part of our continuing mission to provide you with exceptional heart care, we have created designated Provider Care Teams.  These Care Teams include your primary Cardiologist (physician) and Advanced Practice Providers (APPs -  Physician Assistants and Nurse Practitioners) who all work together to provide you with the care you need, when you need it.  Your next appointment:   6 month(s)  The format for your next appointment:   Either In Person or Virtual  Provider:   You may see Mertie Moores, MD or one of the following Advanced Practice Providers on your designated Care Team:    Richardson Dopp, PA-C  [place], [place]  Daune Perch, [place]

## 2019-02-14 NOTE — Progress Notes (Signed)
Cardiology Office Note:    Date:  02/14/2019   ID:  Danielle Frazier, DOB [birth year], MRN [id number]  PCP:  [name], MD  Cardiologist:  Neldon Shepard  Electrophysiologist:  None   Referring MD: [name], MD   Chief Complaint  Patient presents with  . Hypertension  . Atrial Fibrillation    History of Present Illness:    Danielle Frazier is a 85 y.o. female with a hx of HTN and dyspnea.and a new dx of Afib  She was anemic in march and had a transfusion. She was short of breath while she was anemic.    Walks on occasion.   Around the house and to the mailbox.   ECG in A[pril shows Afib. Cannot tell tht her HR is irreg.  Able to do all of her normal activities without any significant problems.  No chest pain.   Had some leg edema sevaral months ago but was started on  Indapamide and the edema has resolved.   No significant leg edema now   No cough, fever.  No PND or orthopena   Dec. 3, 2020:   Danielle Frazier is seen today for a follow-up visit for her atrial fibrillation hypertension.  She also has acute on chronic kidney disease. Still eats salty foods - popcorn.  Has CKD. Is seeing Dr.  Moshe Cipro for nephrology .   she  Discussed diaylsis .   Danielle Frazier does not want to have dialysis .    Past Medical History:  Diagnosis Date  . Abdominal tenderness, RLQ (right lower quadrant) 04/24/2013  . Acute bacterial sinusitis 03/22/2013  . Acute bronchitis 03/24/2018   1/20  . Anemia associated with acute blood loss 06/04/2018  . Bilateral renal cysts 09/07/2011  . BRBPR (bright red blood per rectum) 06/04/2018  . Cerumen impaction 03/24/2018   1/20 L  . CKD (chronic kidney disease) stage 5, GFR less than 15 ml/min (HCC) 08/25/2008   Estimated Creatinine Clearance: 12.7 mL/min (A) (by C-G formula based on SCr of 3.58 mg/dL (H)).  . Constipation, chronic 04/24/2013  . Diabetes mellitus    type II  . 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 DM (diabetes mellitus), type 2 with renal complications ([place])  2/67/1245  . ELECTROCARDIOGRAM, ABNORMAL 08/25/2008   Qualifier: Diagnosis of  By: Ronnald Ramp MD, Arvid Right.   . Essential hypertension 08/25/2008  . Gastroesophageal reflux disease without esophagitis 07/06/2015  . GI bleeding 06/07/2018  . Gout   . GOUT 08/25/2008       . Greater trochanteric bursitis of right hip 11/07/2017   Injected November 07, 2017 repeat injection Jul 13, 2018 repeat injection September 20, 2018  . Hip pain, chronic, right 07/23/2012   Dg Hip Complete Right  07/23/2012   *RADIOLOGY REPORT*  Clinical Data: Right hip pain  RIGHT HIP - COMPLETE 2+ VIEW  Comparison: None.  Findings: No fracture or dislocation is seen.  Visualized bony pelvis appears intact.  Bilateral hip joint spaces are symmetric.  Degenerative changes of the lower lumbar spine.  IMPRESSION: No acute osseous abnormality is seen.   Original Report Authenticated By:   . Hyperlipidemia   . Hyperlipidemia with target LDL less than 100 12/30/2009  . Hypertension   . Insomnia secondary to chronic pain 07/11/2018  . LBP (low back pain)    foraminal narrowing at L4-5 and disc bulge at L5- S1 on MRI 6-08  . Lumbar radiculopathy 12/11/2017  . Morbid obesity ([place]) 03/22/2013  . Otitis media 03/24/2018  1/20 R and possibly L  . Persistent atrial fibrillation ([place]) 06/07/2018  . Renal insufficiency   . Right hip pain 10/16/2017  . Shoulder injury 05/21/2012  . Urinary incontinence   . Uterine fibroid 05/16/2013  . UTI (lower urinary tract infection) 04/24/2013    Past Surgical History:  Procedure Laterality Date  . ESOPHAGOGASTRODUODENOSCOPY (EGD) WITH PROPOFOL N/A 06/08/2018   Procedure: ESOPHAGOGASTRODUODENOSCOPY (EGD) WITH PROPOFOL;  Surgeon: [name], MD;  Location: [facility] ENDOSCOPY;  Service: Gastroenterology;  Laterality: N/A;    Current Medications: Current Meds  Medication Sig  . acetaminophen (TYLENOL) 650 MG CR tablet Take 650 mg by mouth every 8 (eight) hours as needed for pain.  Marland Kitchen allopurinol (ZYLOPRIM) 300 MG  tablet TAKE 1/2 TABLET BY MOUTH DAILY  . apixaban (ELIQUIS) 2.5 MG TABS tablet Take 1 tablet (2.5 mg total) by mouth 2 (two) times daily.  . Blood Glucose Monitoring Suppl (BAYER CONTOUR MONITOR) W/DEVICE KIT 1 Act by Does not apply route 2 (two) times daily.  . Cholecalciferol (VITAMIN D) 50 MCG (2000 UT) tablet Take 2,000 Units by mouth daily.  . fluticasone (FLONASE) 50 MCG/ACT nasal spray Place 2 sprays into both nostrils daily.  Marland Kitchen glucose blood (BAYER CONTOUR TEST) test strip Use BID  . linaclotide (LINZESS) 145 MCG CAPS capsule Take 1 capsule (145 mcg total) by mouth daily.  . Multiple Vitamins-Minerals (CENTRUM SILVER PO) Take 1 tablet by mouth daily.    . nebivolol (BYSTOLIC) 10 MG tablet Take 10 mg by mouth 2 (two) times daily.  Marland Kitchen omeprazole (PRILOSEC) 40 MG capsule TAKE 1 CAPSULE BY MOUTH EVERY DAY     Allergies:   Metformin and Crestor [rosuvastatin]   Social History   Socioeconomic History  . Marital status: Married    Spouse name: Not on file  . Number of children: Not on file  . Years of education: Not on file  . Highest education level: Not on file  Occupational History  . Not on file  Social Needs  . Financial resource strain: Not on file  . Food insecurity    Worry: Not on file    Inability: Not on file  . Transportation needs    Medical: Not on file    Non-medical: Not on file  Tobacco Use  . Smoking status: Never Smoker  . Smokeless tobacco: Never Used  Substance and Sexual Activity  . Alcohol use: No  . Drug use: No  . Sexual activity: Not Currently  Lifestyle  . Physical activity    Days per week: Not on file    Minutes per session: Not on file  . Stress: Not on file  Relationships  . Social Herbalist on phone: Not on file    Gets together: Not on file    Attends religious service: Not on file    Active member of club or organization: Not on file    Attends meetings of clubs or organizations: Not on file    Relationship status: Not on  file  Other Topics Concern  . Not on file  Social History Narrative  . Not on file     Family History: The patient's family history includes Diabetes in an other family member; Hypertension in an other family member.  ROS:   Please see the history of present illness.     All other systems reviewed and are negative.  EKGs/Labs/Other Studies Reviewed:    The following studies were reviewed today:   EKG:  Recent Labs: 08/22/2018: ALT 11; Pro B Natriuretic peptide (BNP) 1,239.0 11/12/2018: Hemoglobin 11.4; Platelets 185.0 11/14/2018: TSH 4.050 11/23/2018: BUN 49; Creatinine, Ser 3.30; Potassium 3.5; Sodium 142  Recent Lipid Panel    Component Value Date/Time   CHOL 154 11/12/2018 0939   TRIG 123.0 11/12/2018 0939   HDL 52.20 11/12/2018 0939   CHOLHDL 3 11/12/2018 0939   VLDL 24.6 11/12/2018 0939   LDLCALC 78 11/12/2018 0939   LDLDIRECT 90.0 07/06/2015 0957    Physical Exam: Blood pressure (!) 170/80, pulse 70, height '5\' 6"'  (1.676 m), weight 177 lb 9.6 oz (80.6 kg), SpO2 96 %.  GEN:   Elderly female,  NAD  HEENT: Normal NECK: No JVD; No carotid bruits LYMPHATICS: No lymphadenopathy CARDIAC: irreg. Irreg. ,  Soft systolic murmur  RESPIRATORY:  Clear to auscultation without rales, wheezing or rhonchi  ABDOMEN: Soft, non-tender, non-distended MUSCULOSKELETAL:  No edema; No deformity  SKIN: Warm and dry NEUROLOGIC:  Alert and oriented x 3   ASSESSMENT:    No diagnosis found. PLAN:    In order of problems listed above:  1. Atrial fibrillation:  CHADS23VASC is 50   ( female, age, HTN) T  Her heart rate is well controlled.  We will start her on Eliquis 2.5 mg twice a day.  She does not need any additional rate controlling medications.  We will check a TSH and also will check an echocardiogram.  2.  Acute on chronic kidney disease:     Medication Adjustments/Labs and Tests Ordered: Current medicines are reviewed at length with the patient today.  Concerns  regarding medicines are outlined above.  No orders of the defined types were placed in this encounter.  No orders of the defined types were placed in this encounter.   There are no Patient Instructions on file for this visit.   Signed, Mertie Moores, MD  02/14/2019 10:32 AM    [place]

## 2019-03-05 ENCOUNTER — Other Ambulatory Visit: Payer: Self-pay | Admitting: Internal Medicine

## 2019-03-05 DIAGNOSIS — K5909 Other constipation: Secondary | ICD-10-CM

## 2019-03-15 ENCOUNTER — Other Ambulatory Visit: Payer: Self-pay | Admitting: Internal Medicine

## 2019-03-20 ENCOUNTER — Other Ambulatory Visit: Payer: Self-pay | Admitting: Internal Medicine

## 2019-03-20 DIAGNOSIS — I10 Essential (primary) hypertension: Secondary | ICD-10-CM

## 2019-04-10 ENCOUNTER — Other Ambulatory Visit: Payer: Self-pay | Admitting: Internal Medicine

## 2019-04-10 DIAGNOSIS — I10 Essential (primary) hypertension: Secondary | ICD-10-CM

## 2019-04-24 DIAGNOSIS — I120 Hypertensive chronic kidney disease with stage 5 chronic kidney disease or end stage renal disease: Secondary | ICD-10-CM | POA: Diagnosis not present

## 2019-04-24 DIAGNOSIS — N185 Chronic kidney disease, stage 5: Secondary | ICD-10-CM | POA: Diagnosis not present

## 2019-04-24 DIAGNOSIS — M109 Gout, unspecified: Secondary | ICD-10-CM | POA: Diagnosis not present

## 2019-05-14 DIAGNOSIS — D631 Anemia in chronic kidney disease: Secondary | ICD-10-CM | POA: Diagnosis not present

## 2019-05-21 DIAGNOSIS — D631 Anemia in chronic kidney disease: Secondary | ICD-10-CM | POA: Diagnosis not present

## 2019-06-11 ENCOUNTER — Ambulatory Visit (INDEPENDENT_AMBULATORY_CARE_PROVIDER_SITE_OTHER): Payer: Medicare Other | Admitting: Internal Medicine

## 2019-06-11 ENCOUNTER — Encounter: Payer: Self-pay | Admitting: Internal Medicine

## 2019-06-11 ENCOUNTER — Other Ambulatory Visit: Payer: Self-pay

## 2019-06-11 VITALS — BP 162/82 | HR 79 | Temp 97.8°F | Resp 16 | Ht 66.0 in | Wt 184.2 lb

## 2019-06-11 DIAGNOSIS — M5416 Radiculopathy, lumbar region: Secondary | ICD-10-CM | POA: Diagnosis not present

## 2019-06-11 DIAGNOSIS — D539 Nutritional anemia, unspecified: Secondary | ICD-10-CM | POA: Diagnosis not present

## 2019-06-11 DIAGNOSIS — J301 Allergic rhinitis due to pollen: Secondary | ICD-10-CM

## 2019-06-11 DIAGNOSIS — M7061 Trochanteric bursitis, right hip: Secondary | ICD-10-CM

## 2019-06-11 DIAGNOSIS — E1121 Type 2 diabetes mellitus with diabetic nephropathy: Secondary | ICD-10-CM

## 2019-06-11 DIAGNOSIS — N185 Chronic kidney disease, stage 5: Secondary | ICD-10-CM | POA: Diagnosis not present

## 2019-06-11 DIAGNOSIS — I10 Essential (primary) hypertension: Secondary | ICD-10-CM

## 2019-06-11 LAB — BASIC METABOLIC PANEL
BUN: 41 mg/dL — ABNORMAL HIGH (ref 6–23)
CO2: 29 mEq/L (ref 19–32)
Calcium: 10.8 mg/dL — ABNORMAL HIGH (ref 8.4–10.5)
Chloride: 101 mEq/L (ref 96–112)
Creatinine, Ser: 3.58 mg/dL — ABNORMAL HIGH (ref 0.40–1.20)
GFR: 12.06 mL/min — CL (ref >60.00)
Glucose, Bld: 104 mg/dL — ABNORMAL HIGH (ref 70–99)
Potassium: 3.4 mEq/L — ABNORMAL LOW (ref 3.5–5.1)
Sodium: 142 mEq/L (ref 135–145)

## 2019-06-11 LAB — URINALYSIS, ROUTINE W REFLEX MICROSCOPIC
Bilirubin Urine: NEGATIVE
Ketones, ur: NEGATIVE
Leukocytes,Ua: NEGATIVE
Nitrite: NEGATIVE
Specific Gravity, Urine: 1.02 (ref 1.000–1.030)
Total Protein, Urine: 100 — AB
Urine Glucose: NEGATIVE
Urobilinogen, UA: 0.2 (ref 0.0–1.0)
pH: 6 (ref 5.0–8.0)

## 2019-06-11 LAB — IBC PANEL
Iron: 82 ug/dL (ref 42–145)
Saturation Ratios: 27.9 % (ref 20.0–50.0)
Transferrin: 210 mg/dL — ABNORMAL LOW (ref 212.0–360.0)

## 2019-06-11 LAB — VITAMIN B12: Vitamin B-12: 1028 pg/mL — ABNORMAL HIGH (ref 211–911)

## 2019-06-11 LAB — MICROALBUMIN / CREATININE URINE RATIO
Creatinine,U: 79 mg/dL
Microalb Creat Ratio: 168.4 mg/g — ABNORMAL HIGH (ref 0.0–30.0)
Microalb, Ur: 133 mg/dL — ABNORMAL HIGH (ref 0.0–1.9)

## 2019-06-11 LAB — FOLATE: Folate: >23.7 ng/mL (ref >5.9)

## 2019-06-11 LAB — FERRITIN: Ferritin: 207.9 ng/mL (ref 10.0–291.0)

## 2019-06-11 LAB — HEMOGLOBIN A1C: Hgb A1c MFr Bld: 5.5 % (ref 4.6–6.5)

## 2019-06-11 MED ORDER — FLUTICASONE PROPIONATE 50 MCG/ACT NA SUSP: 2.0000 | Freq: Every day | NASAL | 1 refills | Status: DC

## 2019-06-11 MED ORDER — OXYCODONE HCL 5 MG PO TABS: 5.0000 mg | ORAL_TABLET | Freq: Four times a day (QID) | ORAL | 0 refills | Status: DC | PRN

## 2019-06-11 NOTE — Progress Notes (Signed)
Subjective:  Patient ID: Danielle Frazier, female    DOB: [birth year]  Age: 86 y.o. MRN: [id number]  CC: Anemia, Hypertension, Diabetes, Osteoarthritis, and Back Pain  This visit occurred during the SARS-CoV-2 public health emergency.  Safety protocols were in place, including screening questions prior to the visit, additional usage of staff PPE, and extensive cleaning of exam room while observing appropriate contact time as indicated for disinfecting solutions.    HPI Danielle Frazier presents for f/up - She complains of chronic, worsening right hip and low back pain.  She has seen sports medicine and has been told that surgery is not an option.  She is not getting much symptom relief with Tylenol.  NSAIDs are contraindicated.  Outpatient Medications Prior to Visit  Medication Sig Dispense Refill  . acetaminophen (TYLENOL) 650 MG CR tablet Take 650 mg by mouth every 8 (eight) hours as needed for pain.    Marland Kitchen allopurinol (ZYLOPRIM) 300 MG tablet TAKE 1/2 TABLET BY MOUTH DAILY 45 tablet 1  . apixaban (ELIQUIS) 2.5 MG TABS tablet Take 1 tablet (2.5 mg total) by mouth 2 (two) times daily. 60 tablet 5  . Blood Glucose Monitoring Suppl (BAYER CONTOUR MONITOR) W/DEVICE KIT 1 Act by Does not apply route 2 (two) times daily. 100 kit 0  . Cholecalciferol (VITAMIN D) 50 MCG (2000 UT) tablet Take 2,000 Units by mouth daily.    Marland Kitchen glucose blood (BAYER CONTOUR TEST) test strip Use BID 100 each 11  . indapamide (LOZOL) 1.25 MG tablet Take 1.25 mg by mouth daily.    Marland Kitchen LINZESS 145 MCG CAPS capsule TAKE 1 CAPSULE BY MOUTH EVERY DAY 90 capsule 1  . Multiple Vitamins-Minerals (CENTRUM SILVER PO) Take 1 tablet by mouth daily.      . nebivolol (BYSTOLIC) 10 MG tablet Take 10 mg by mouth 2 (two) times daily.    Marland Kitchen omeprazole (PRILOSEC) 40 MG capsule TAKE 1 CAPSULE BY MOUTH EVERY DAY 90 capsule 1  . fluticasone (FLONASE) 50 MCG/ACT nasal spray Place 2 sprays into both nostrils daily. 16 g 6   No facility-administered  medications prior to visit.    ROS Review of Systems  Constitutional: Negative.  Negative for diaphoresis and fatigue.  HENT: Negative.   Eyes: Negative for visual disturbance.  Respiratory: Negative for cough, chest tightness, shortness of breath and wheezing.   Cardiovascular: Negative for chest pain, palpitations and leg swelling.  Gastrointestinal: Negative for abdominal pain, blood in stool, constipation, diarrhea, nausea and vomiting.  Endocrine: Negative.   Genitourinary: Negative.  Negative for difficulty urinating and frequency.  Musculoskeletal: Positive for arthralgias and back pain.  Skin: Negative.  Negative for color change and pallor.  Neurological: Negative.  Negative for dizziness, weakness, light-headedness and headaches.  Hematological: Negative for adenopathy. Does not bruise/bleed easily.  Psychiatric/Behavioral: Negative.     Objective:  BP (!) 162/82 (BP Location: Left Arm, Patient Position: Sitting, Cuff Size: Large)   Pulse 79   Temp 97.8 F (36.6 C) (Oral)   Resp 16   Ht '5\' 6"'  (1.676 m)   Wt 184 lb 4 oz (83.6 kg)   SpO2 95%   BMI 29.74 kg/m   BP Readings from Last 3 Encounters:  06/11/19 (!) 162/82  02/14/19 (!) 130/95  12/12/18 (!) 146/82    Wt Readings from Last 3 Encounters:  06/11/19 184 lb 4 oz (83.6 kg)  02/14/19 177 lb 9.6 oz (80.6 kg)  12/12/18 175 lb 8 oz (79.6 kg)  Physical Exam Vitals reviewed.  Constitutional:      Appearance: Normal appearance.  HENT:     Nose: Nose normal.     Mouth/Throat:     Mouth: Mucous membranes are moist.  Eyes:     General: No scleral icterus.    Conjunctiva/sclera: Conjunctivae normal.  Cardiovascular:     Rate and Rhythm: Normal rate. Rhythm irregularly irregular.     Heart sounds: No murmur.  Pulmonary:     Effort: Pulmonary effort is normal.     Breath sounds: No stridor. No wheezing, rhonchi or rales.  Abdominal:     General: Abdomen is flat. Bowel sounds are normal. There is no  distension.     Palpations: Abdomen is soft. There is no hepatomegaly, splenomegaly or mass.     Tenderness: There is no abdominal tenderness.  Musculoskeletal:        General: Normal range of motion.     Cervical back: Neck supple.     Right lower leg: No edema.     Left lower leg: No edema.  Skin:    General: Skin is warm and dry.  Neurological:     General: No focal deficit present.     Mental Status: She is alert.     Lab Results  Component Value Date   WBC 5.9 11/12/2018   HGB 11.4 (L) 11/12/2018   HCT 36.5 11/12/2018   PLT 185.0 11/12/2018   GLUCOSE 104 (H) 06/11/2019   CHOL 154 11/12/2018   TRIG 123.0 11/12/2018   HDL 52.20 11/12/2018   LDLDIRECT 90.0 07/06/2015   LDLCALC 78 11/12/2018   ALT 11 08/22/2018   AST 16 08/22/2018   NA 142 06/11/2019   K 3.4 (L) 06/11/2019   CL 101 06/11/2019   CREATININE 3.58 (H) 06/11/2019   BUN 41 (H) 06/11/2019   CO2 29 06/11/2019   TSH 4.050 11/14/2018   INR 1.1 (H) 06/05/2018   HGBA1C 5.5 06/11/2019   MICROALBUR 133.0 (H) 06/11/2019    US RENAL  Result Date: 12/06/2018 CLINICAL DATA:  Chronic kidney disease stage 5 EXAM: RENAL / URINARY TRACT ULTRASOUND COMPLETE COMPARISON:  None. FINDINGS: Right Kidney: Renal measurements: 9.8 x 3.7 x 3.9 cm = volume: 72.9 mL. Increased parenchymal echogenicity. Diffuse renal cortical thinning. Small mass in the upper pole hypo to anechoic, 1.2 x 1.0 x 1.0 cm. Midpole cyst measuring 1.9 x 1.4 x 2.0 cm. No other masses, no stones and no hydronephrosis. Left Kidney: Renal measurements: 9.1 x 4.7 x 5.2 cm = volume: 116.1 mL. Increased parenchymal echogenicity. Diffuse renal cortical thinning. Simple cyst projects from the lower pole measuring 5.8 x 3.8 x 5.5 cm. No other masses, no stones and no hydronephrosis. Bladder: Appears normal for degree of bladder distention. IMPRESSION: 1. No acute findings.  No hydronephrosis. 2. Findings of medical renal disease with reduced renal sizes and diffuse  increased renal parenchymal echogenicity. 3. 5.8 cm cyst, lower pole left kidney. 2 cm cyst, midpole the right kidney. Smaller, 1.2 cm, cystic appearing mass from the upper pole the right kidney, not fully characterized. This lesion is also likely a cyst, but could be more completely characterized with renal protocol MRI with and without contrast. Electronically Signed   By: Lajean Manes M.D.   On: 12/06/2018 16:53    Assessment & Plan:   Danielle Frazier was seen today for anemia, hypertension, diabetes, osteoarthritis and back pain.  Diagnoses and all orders for this visit:  Essential hypertension- Her BP is adequately well  controlled -     Urinalysis, Routine w reflex microscopic; Future -     Urinalysis, Routine w reflex microscopic  Type 2 diabetes mellitus with diabetic nephropathy, without long-term current use of insulin ([place])- Her blood sugars are adequately well controlled -     Basic metabolic panel; Future -     Hemoglobin A1c; Future -     Microalbumin / creatinine urine ratio; Future -     Microalbumin / creatinine urine ratio -     Hemoglobin A1c -     Basic metabolic panel  CKD (chronic kidney disease) stage 5, GFR less than 15 ml/min (HCC)- Her renal function is stable. She will avoid nsaids. -     Basic metabolic panel; Future -     Urinalysis, Routine w reflex microscopic; Future -     Urinalysis, Routine w reflex microscopic -     Basic metabolic panel  Deficiency anemia- Will monitor for vit deficiencies -     Vitamin B12; Future -     Folate; Future -     Ferritin; Future -     Vitamin B1; Future -     IBC panel; Future -     IBC panel -     Vitamin B1 -     Ferritin -     Folate -     Vitamin B12  Lumbar radiculopathy -     oxyCODONE (OXY IR/ROXICODONE) 5 MG immediate release tablet; Take 1 tablet (5 mg total) by mouth every 6 (six) hours as needed for severe pain.  Greater trochanteric bursitis of right hip -     oxyCODONE (OXY IR/ROXICODONE) 5 MG immediate  release tablet; Take 1 tablet (5 mg total) by mouth every 6 (six) hours as needed for severe pain.  Seasonal allergic rhinitis due to pollen -     fluticasone (FLONASE) 50 MCG/ACT nasal spray; Place 2 sprays into both nostrils daily.   I am having Danielle Frazier start on oxyCODONE. I am also having her maintain her Multiple Vitamins-Minerals (CENTRUM SILVER PO), Bayer Contour Monitor, glucose blood, Vitamin D, acetaminophen, omeprazole, apixaban, nebivolol, Linzess, allopurinol, indapamide, and fluticasone.  Meds ordered this encounter  Medications  . oxyCODONE (OXY IR/ROXICODONE) 5 MG immediate release tablet    Sig: Take 1 tablet (5 mg total) by mouth every 6 (six) hours as needed for severe pain.    Dispense:  75 tablet    Refill:  0  . fluticasone (FLONASE) 50 MCG/ACT nasal spray    Sig: Place 2 sprays into both nostrils daily.    Dispense:  48 g    Refill:  1     Follow-up: Return in about 3 months (around 09/11/2019).  [name], MD

## 2019-06-11 NOTE — Patient Instructions (Signed)

## 2019-06-13 ENCOUNTER — Other Ambulatory Visit: Payer: Self-pay | Admitting: Internal Medicine

## 2019-06-13 DIAGNOSIS — K219 Gastro-esophageal reflux disease without esophagitis: Secondary | ICD-10-CM

## 2019-06-15 LAB — VITAMIN B1: Vitamin B1 (Thiamine): 32 nmol/L — ABNORMAL HIGH (ref 8–30)

## 2019-06-17 ENCOUNTER — Other Ambulatory Visit: Payer: Self-pay | Admitting: Physician Assistant

## 2019-06-17 DIAGNOSIS — N185 Chronic kidney disease, stage 5: Secondary | ICD-10-CM | POA: Diagnosis not present

## 2019-06-17 DIAGNOSIS — R609 Edema, unspecified: Secondary | ICD-10-CM

## 2019-06-17 DIAGNOSIS — I480 Paroxysmal atrial fibrillation: Secondary | ICD-10-CM | POA: Diagnosis not present

## 2019-06-17 DIAGNOSIS — I120 Hypertensive chronic kidney disease with stage 5 chronic kidney disease or end stage renal disease: Secondary | ICD-10-CM | POA: Diagnosis not present

## 2019-06-17 DIAGNOSIS — D631 Anemia in chronic kidney disease: Secondary | ICD-10-CM | POA: Diagnosis not present

## 2019-06-18 ENCOUNTER — Ambulatory Visit
Admission: RE | Admit: 2019-06-18 | Discharge: 2019-06-18 | Disposition: A | Discharge status: Home / Self Care | Payer: Medicare Other | Source: Ambulatory Visit | Attending: Physician Assistant | Admitting: Physician Assistant

## 2019-06-18 DIAGNOSIS — R609 Edema, unspecified: Secondary | ICD-10-CM

## 2019-06-18 DIAGNOSIS — R600 Localized edema: Secondary | ICD-10-CM | POA: Diagnosis not present

## 2019-06-19 ENCOUNTER — Other Ambulatory Visit: Payer: Self-pay | Admitting: Internal Medicine

## 2019-06-19 DIAGNOSIS — I4819 Other persistent atrial fibrillation: Secondary | ICD-10-CM

## 2019-06-24 DIAGNOSIS — N185 Chronic kidney disease, stage 5: Secondary | ICD-10-CM | POA: Diagnosis not present

## 2019-06-24 DIAGNOSIS — E876 Hypokalemia: Secondary | ICD-10-CM | POA: Diagnosis not present

## 2019-06-24 DIAGNOSIS — I129 Hypertensive chronic kidney disease with stage 1 through stage 4 chronic kidney disease, or unspecified chronic kidney disease: Secondary | ICD-10-CM | POA: Diagnosis not present

## 2019-06-24 DIAGNOSIS — R609 Edema, unspecified: Secondary | ICD-10-CM | POA: Diagnosis not present

## 2019-07-10 DIAGNOSIS — N189 Chronic kidney disease, unspecified: Secondary | ICD-10-CM | POA: Diagnosis not present

## 2019-07-10 DIAGNOSIS — D631 Anemia in chronic kidney disease: Secondary | ICD-10-CM | POA: Diagnosis not present

## 2019-07-10 DIAGNOSIS — I120 Hypertensive chronic kidney disease with stage 5 chronic kidney disease or end stage renal disease: Secondary | ICD-10-CM | POA: Diagnosis not present

## 2019-07-10 DIAGNOSIS — N185 Chronic kidney disease, stage 5: Secondary | ICD-10-CM | POA: Diagnosis not present

## 2019-07-10 DIAGNOSIS — R609 Edema, unspecified: Secondary | ICD-10-CM | POA: Diagnosis not present

## 2019-07-10 DIAGNOSIS — E876 Hypokalemia: Secondary | ICD-10-CM | POA: Diagnosis not present

## 2019-08-14 ENCOUNTER — Encounter: Payer: Self-pay | Admitting: Cardiovascular Disease

## 2019-08-14 ENCOUNTER — Ambulatory Visit: Payer: Medicare Other | Admitting: Cardiovascular Disease

## 2019-08-14 ENCOUNTER — Other Ambulatory Visit: Payer: Self-pay

## 2019-08-14 VITALS — BP 120/80 | HR 62 | Ht 66.0 in | Wt 153.0 lb

## 2019-08-14 DIAGNOSIS — I4819 Other persistent atrial fibrillation: Secondary | ICD-10-CM | POA: Diagnosis not present

## 2019-08-14 NOTE — Progress Notes (Signed)
Cardiology Office Note:    Date:  08/14/2019   ID:  Danielle Frazier, DOB 1930-09-15, MRN [id number]  PCP:  [name], MD  Cardiologist:  Keean Wilmeth  Electrophysiologist:  None   Referring MD: [name], MD   Chief Complaint  Patient presents with  . Hypertension  . Atrial Fibrillation    History of Present Illness:    Danielle Frazier is a 86 y.o. female with a hx of HTN and dyspnea.and a new dx of Afib  She was anemic in march and had a transfusion. She was short of breath while she was anemic.    Walks on occasion.   Around the house and to the mailbox.   ECG in A[pril shows Afib. Cannot tell tht her HR is irreg.  Able to do all of her normal activities without any significant problems.  No chest pain.   Had some leg edema sevaral months ago but was started on  Indapamide and the edema has resolved.   No significant leg edema now   No cough, fever.  No PND or orthopena   Dec. 3, 2020:   Danielle Frazier is seen today for a follow-up visit for her atrial fibrillation hypertension.  She also has acute on chronic kidney disease. Still eats salty foods - popcorn.  Has CKD. Is seeing Dr.  Moshe Cipro for nephrology .   she  Discussed diaylsis .   Danielle Frazier does not want to have dialysis .   August 14, 2019:   Danielle Frazier is seen today for follow-up visit regarding her atrial fibrillation and hypertension.  She has acute on chronic kidney disease.  She cannot tell that her HR is irreg.  Walks with a walker for stability   Has pain in her right leg.  Pulses were checked and her distal foot pulses are bounding .    BP [place] up today   Past Medical History:  Diagnosis Date  . Abdominal tenderness, RLQ (right lower quadrant) 04/24/2013  . Acute bacterial sinusitis 03/22/2013  . Acute bronchitis 03/24/2018   1/20  . Anemia associated with acute blood loss 06/04/2018  . Bilateral renal cysts 09/07/2011  . BRBPR (bright red blood per rectum) 06/04/2018  . Cerumen impaction 03/24/2018   1/20 L  . CKD  (chronic kidney disease) stage 5, GFR less than 15 ml/min (HCC) 08/25/2008   Estimated Creatinine Clearance: 12.7 mL/min (A) (by C-G formula based on SCr of 3.58 mg/dL (H)).  . Constipation, chronic 04/24/2013  . Diabetes mellitus    type II  . 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 DM (diabetes mellitus), type 2 with renal complications ([place]) 04/01/4172  . ELECTROCARDIOGRAM, ABNORMAL 08/25/2008   Qualifier: Diagnosis of  By: Ronnald Ramp MD, Arvid Right.   . Essential hypertension 08/25/2008  . Gastroesophageal reflux disease without esophagitis 07/06/2015  . GI bleeding 06/07/2018  . Gout   . GOUT 08/25/2008       . Greater trochanteric bursitis of right hip 11/07/2017   Injected November 07, 2017 repeat injection Jul 13, 2018 repeat injection September 20, 2018  . Hip pain, chronic, right 07/23/2012   Dg Hip Complete Right  07/23/2012   *RADIOLOGY REPORT*  Clinical Data: Right hip pain  RIGHT HIP - COMPLETE 2+ VIEW  Comparison: None.  Findings: No fracture or dislocation is seen.  Visualized bony pelvis appears intact.  Bilateral hip joint spaces are symmetric.  Degenerative changes of the lower lumbar spine.  IMPRESSION: No acute osseous abnormality is seen.  Original Report Authenticated By:   . Hyperlipidemia   . Hyperlipidemia with target LDL less than 100 12/30/2009  . Hypertension   . Insomnia secondary to chronic pain 07/11/2018  . LBP (low back pain)    foraminal narrowing at L4-5 and disc bulge at L5- S1 on MRI 6-08  . Lumbar radiculopathy 12/11/2017  . Morbid obesity ([place]) 03/22/2013  . Otitis media 03/24/2018   1/20 R and possibly L  . Persistent atrial fibrillation ([place]) 06/07/2018  . Renal insufficiency   . Right hip pain 10/16/2017  . Shoulder injury 05/21/2012  . Urinary incontinence   . Uterine fibroid 05/16/2013  . UTI (lower urinary tract infection) 04/24/2013    Past Surgical History:  Procedure Laterality Date  . ESOPHAGOGASTRODUODENOSCOPY (EGD) WITH PROPOFOL N/A 06/08/2018   Procedure:  ESOPHAGOGASTRODUODENOSCOPY (EGD) WITH PROPOFOL;  Surgeon: [name], MD;  Location: [facility];  Service: Gastroenterology;  Laterality: N/A;    Current Medications: Current Meds  Medication Sig  . acetaminophen (TYLENOL) 650 MG CR tablet Take 650 mg by mouth every 8 (eight) hours as needed for pain.  Marland Kitchen allopurinol (ZYLOPRIM) 300 MG tablet TAKE 1/2 TABLET BY MOUTH DAILY  . Blood Glucose Monitoring Suppl (BAYER CONTOUR MONITOR) W/DEVICE KIT 1 Act by Does not apply route 2 (two) times daily.  . Cholecalciferol (VITAMIN D) 50 MCG (2000 UT) tablet Take 2,000 Units by mouth daily.  Marland Kitchen ELIQUIS 2.5 MG TABS tablet TAKE 1 TABLET BY MOUTH TWICE A DAY  . famotidine (PEPCID) 40 MG tablet Take 40 mg by mouth daily.  . fluticasone (FLONASE) 50 MCG/ACT nasal spray Place 2 sprays into both nostrils daily.  Marland Kitchen glucose blood (BAYER CONTOUR TEST) test strip Use BID  . indapamide (LOZOL) 1.25 MG tablet Take 1.25 mg by mouth daily.  Marland Kitchen LINZESS 145 MCG CAPS capsule TAKE 1 CAPSULE BY MOUTH EVERY DAY  . Multiple Vitamins-Minerals (CENTRUM SILVER PO) Take 1 tablet by mouth daily.    . nebivolol (BYSTOLIC) 10 MG tablet Take 10 mg by mouth 2 (two) times daily.  Marland Kitchen oxyCODONE (OXY IR/ROXICODONE) 5 MG immediate release tablet Take 1 tablet (5 mg total) by mouth every 6 (six) hours as needed for severe pain.     Allergies:   Metformin and Crestor [rosuvastatin]   Social History   Socioeconomic History  . Marital status: Married    Spouse name: Not on file  . Number of children: Not on file  . Years of education: Not on file  . Highest education level: Not on file  Occupational History  . Not on file  Tobacco Use  . Smoking status: Never Smoker  . Smokeless tobacco: Never Used  Substance and Sexual Activity  . Alcohol use: No  . Drug use: No  . Sexual activity: Not Currently  Other Topics Concern  . Not on file  Social History Narrative  . Not on file   Social Determinants of Health   Financial  Resource Strain:   . Difficulty of Paying Living Expenses:   Food Insecurity:   . Worried About Charity fundraiser in the Last Year:   . Arboriculturist in the Last Year:   Transportation Needs:   . Film/video editor (Medical):   Marland Kitchen Lack of Transportation (Non-Medical):   Physical Activity:   . Days of Exercise per Week:   . Minutes of Exercise per Session:   Stress:   . Feeling of Stress :   Social Connections:   .  Frequency of Communication with Friends and Family:   . Frequency of Social Gatherings with Friends and Family:   . Attends Religious Services:   . Active Member of Clubs or Organizations:   . Attends Archivist Meetings:   Marland Kitchen Marital Status:      Family History: The patient's family history includes Diabetes in an other family member; Hypertension in an other family member.  ROS:   Please see the history of present illness.     All other systems reviewed and are negative.  EKGs/Labs/Other Studies Reviewed:    The following studies were reviewed today:   Recent Labs: 08/22/2018: ALT 11; Pro B Natriuretic peptide (BNP) 1,239.0 11/12/2018: Hemoglobin 11.4; Platelets 185.0 11/14/2018: TSH 4.050 06/11/2019: BUN 41; Creatinine, Ser 3.58; Potassium 3.4; Sodium 142  Recent Lipid Panel    Component Value Date/Time   CHOL 154 11/12/2018 0939   TRIG 123.0 11/12/2018 0939   HDL 52.20 11/12/2018 0939   CHOLHDL 3 11/12/2018 0939   VLDL 24.6 11/12/2018 0939   LDLCALC 78 11/12/2018 0939   LDLDIRECT 90.0 07/06/2015 0957    Physical Exam: Blood pressure 120/80, pulse 62, height _0  (1.676 m), weight 153 lb (69.4 kg), SpO2 99 %.  GEN:    Elderly female, no acute distress HEENT: Normal NECK: No JVD; No carotid bruits LYMPHATICS: No lymphadenopathy CARDIAC: Irregularly irregular, no murmurs, rubs, gallops RESPIRATORY:  Clear to auscultation without rales, wheezing or rhonchi  ABDOMEN: Soft, non-tender, non-distended MUSCULOSKELETAL:  No edema; No  deformity  SKIN: Warm and dry NEUROLOGIC:  Alert and oriented x 3  ECG: August 14, 2019: Atrial fibrillation with a heart rate of 65.  No ST or T wave changes.  No changes from previous EKG.  ASSESSMENT:    1. Persistent atrial fibrillation (HCC)    PLAN:    In order of problems listed above:  1. Atrial fibrillation:  CHADS23VASC is 58   ( female, age, HTN)  She remains in atrial fibrillation.  She cannot tell that her heart rate is irregular.  We will continue Eliquis.  Her rate is well controlled.  2.  Acute on chronic kidney disease: Managed by her primary medical doctor and nephrology.  3.  Hypertension: Continue current medications.  We will have her see an APP in 6 months.  Medication Adjustments/Labs and Tests Ordered: Current medicines are reviewed at length with the patient today.  Concerns regarding medicines are outlined above.  Orders Placed This Encounter  Procedures  . EKG 12-Lead   No orders of the defined types were placed in this encounter.   Patient Instructions  Your physician recommends that you continue on your current medications as directed. Please refer to the Current Medication list given to you today.      *If you need a refill on your cardiac medications before your next appointment, please call your pharmacy*   Lab Work: NONE If you have labs (blood work) drawn today and your tests are completely normal, you will receive your results only by: Marland Kitchen MyChart Message (if you have MyChart) OR . A paper copy in the mail If you have any lab test that is abnormal or we need to change your treatment, we will call you to review the results.   Testing/Procedures: NONE   Follow-Up: At [facility], you and your health needs are our priority.  As part of our continuing mission to provide you with exceptional heart care, we have created designated Provider Care Teams.  These  Care Teams include your primary Cardiologist (physician) and Advanced Practice  Providers (APPs -  Physician Assistants and Nurse Practitioners) who all work together to provide you with the care you need, when you need it.  We recommend signing up for the patient portal called "MyChart".  Sign up information is provided on this After Visit Summary.  MyChart is used to connect with patients for Virtual Visits (Telemedicine).  Patients are able to view lab/test results, encounter notes, upcoming appointments, etc.  Non-urgent messages can be sent to your provider as well.   To learn more about what you can do with MyChart, go to [email or web address].    Your next appointment:   6 MONTHS  The format for your next appointment:   In Person  Provider:   You WILL SEE one of the following Advanced Practice Providers on your designated Care Team:    Richardson Dopp, PA-C  Robbie Lis, [place]    Other Instructions NONE     Signed, Mertie Moores, MD  08/14/2019 5:25 PM    [place]

## 2019-08-14 NOTE — Patient Instructions (Addendum)
Your physician recommends that you continue on your current medications as directed. Please refer to the Current Medication list given to you today.      *If you need a refill on your cardiac medications before your next appointment, please call your pharmacy*   Lab Work: NONE If you have labs (blood work) drawn today and your tests are completely normal, you will receive your results only by: Marland Kitchen MyChart Message (if you have MyChart) OR . A paper copy in the mail If you have any lab test that is abnormal or we need to change your treatment, we will call you to review the results.   Testing/Procedures: NONE   Follow-Up: At [facility], you and your health needs are our priority.  As part of our continuing mission to provide you with exceptional heart care, we have created designated Provider Care Teams.  These Care Teams include your primary Cardiologist (physician) and Advanced Practice Providers (APPs -  Physician Assistants and Nurse Practitioners) who all work together to provide you with the care you need, when you need it.  We recommend signing up for the patient portal called "MyChart".  Sign up information is provided on this After Visit Summary.  MyChart is used to connect with patients for Virtual Visits (Telemedicine).  Patients are able to view lab/test results, encounter notes, upcoming appointments, etc.  Non-urgent messages can be sent to your provider as well.   To learn more about what you can do with MyChart, go to [email or web address].    Your next appointment:   6 MONTHS  The format for your next appointment:   In Person  Provider:   You WILL SEE one of the following Advanced Practice Providers on your designated Care Team:    Richardson Dopp, PA-C  [place], [place]    Other Instructions NONE

## 2019-08-22 DIAGNOSIS — N185 Chronic kidney disease, stage 5: Secondary | ICD-10-CM | POA: Diagnosis not present

## 2019-08-22 DIAGNOSIS — I120 Hypertensive chronic kidney disease with stage 5 chronic kidney disease or end stage renal disease: Secondary | ICD-10-CM | POA: Diagnosis not present

## 2019-08-22 DIAGNOSIS — D631 Anemia in chronic kidney disease: Secondary | ICD-10-CM | POA: Diagnosis not present

## 2019-08-22 DIAGNOSIS — N189 Chronic kidney disease, unspecified: Secondary | ICD-10-CM | POA: Diagnosis not present

## 2019-08-22 DIAGNOSIS — M109 Gout, unspecified: Secondary | ICD-10-CM | POA: Diagnosis not present

## 2019-08-22 DIAGNOSIS — E876 Hypokalemia: Secondary | ICD-10-CM | POA: Diagnosis not present

## 2019-08-22 DIAGNOSIS — E1122 Type 2 diabetes mellitus with diabetic chronic kidney disease: Secondary | ICD-10-CM | POA: Diagnosis not present

## 2019-09-11 ENCOUNTER — Ambulatory Visit (INDEPENDENT_AMBULATORY_CARE_PROVIDER_SITE_OTHER): Payer: Medicare Other | Admitting: Internal Medicine

## 2019-09-11 ENCOUNTER — Encounter: Payer: Self-pay | Admitting: Internal Medicine

## 2019-09-11 ENCOUNTER — Other Ambulatory Visit: Payer: Self-pay

## 2019-09-11 VITALS — BP 156/78 | HR 64 | Temp 97.8°F | Resp 16 | Ht 66.0 in | Wt 147.2 lb

## 2019-09-11 DIAGNOSIS — I10 Essential (primary) hypertension: Secondary | ICD-10-CM

## 2019-09-11 DIAGNOSIS — M1A39X0 Chronic gout due to renal impairment, multiple sites, without tophus (tophi): Secondary | ICD-10-CM

## 2019-09-11 DIAGNOSIS — I4819 Other persistent atrial fibrillation: Secondary | ICD-10-CM

## 2019-09-11 DIAGNOSIS — N185 Chronic kidney disease, stage 5: Secondary | ICD-10-CM | POA: Diagnosis not present

## 2019-09-11 NOTE — Progress Notes (Signed)
Subjective:  Patient ID: Danielle Frazier, female    DOB: July 02, 1930  Age: 86 y.o. MRN: [id number]  CC: Hypertension and Atrial Fibrillation  This visit occurred during the SARS-CoV-2 public health emergency.  Safety protocols were in place, including screening questions prior to the visit, additional usage of staff PPE, and extensive cleaning of exam room while observing appropriate contact time as indicated for disinfecting solutions.    HPI CHESNI VOS presents for f/up - She tells me she is followed closely by nephrology.  She has been seen within the last few months.  I do not have any lab data from those visits.  She complains of weight loss.  She tells me she has decided not to undergo dialysis.  Outpatient Medications Prior to Visit  Medication Sig Dispense Refill   acetaminophen (TYLENOL) 650 MG CR tablet Take 650 mg by mouth every 8 (eight) hours as needed for pain.     Blood Glucose Monitoring Suppl (BAYER CONTOUR MONITOR) W/DEVICE KIT 1 Act by Does not apply route 2 (two) times daily. 100 kit 0   Cholecalciferol (VITAMIN D) 50 MCG (2000 UT) tablet Take 2,000 Units by mouth daily.     ELIQUIS 2.5 MG TABS tablet TAKE 1 TABLET BY MOUTH TWICE A DAY 60 tablet 5   famotidine (PEPCID) 40 MG tablet Take 40 mg by mouth daily.     furosemide (LASIX) 40 MG tablet Take 40 mg by mouth daily.     glucose blood (BAYER CONTOUR TEST) test strip Use BID 100 each 11   LINZESS 145 MCG CAPS capsule TAKE 1 CAPSULE BY MOUTH EVERY DAY 90 capsule 1   Multiple Vitamins-Minerals (CENTRUM SILVER PO) Take 1 tablet by mouth daily.       nebivolol (BYSTOLIC) 10 MG tablet Take 10 mg by mouth 2 (two) times daily.     oxyCODONE (OXY IR/ROXICODONE) 5 MG immediate release tablet Take 1 tablet (5 mg total) by mouth every 6 (six) hours as needed for severe pain. 75 tablet 0   allopurinol (ZYLOPRIM) 300 MG tablet TAKE 1/2 TABLET BY MOUTH DAILY 45 tablet 1   fluticasone (FLONASE) 50 MCG/ACT nasal spray  Place 2 sprays into both nostrils daily. 48 g 1   indapamide (LOZOL) 1.25 MG tablet Take 1.25 mg by mouth daily.     No facility-administered medications prior to visit.    ROS Review of Systems  Constitutional: Positive for fatigue and unexpected weight change. Negative for appetite change, chills and diaphoresis.  HENT: Negative.   Eyes: Negative.   Respiratory: Negative.  Negative for cough, chest tightness, shortness of breath and wheezing.   Cardiovascular: Negative for chest pain, palpitations and leg swelling.  Gastrointestinal: Negative for abdominal pain, constipation, diarrhea, nausea and vomiting.  Endocrine: Negative.   Genitourinary: Negative.  Negative for difficulty urinating and urgency.  Musculoskeletal: Negative for arthralgias.  Skin: Negative.  Negative for color change.  Neurological: Negative for dizziness, weakness and light-headedness.  Hematological: Negative for adenopathy. Does not bruise/bleed easily.  Psychiatric/Behavioral: Negative.     Objective:  BP (!) 156/78 (BP Location: Left Arm, Patient Position: Sitting, Cuff Size: Normal)    Pulse 64    Temp 97.8 F (36.6 C) (Oral)    Resp 16    Ht _0  (1.676 m)    Wt 147 lb 4 oz (66.8 kg)    SpO2 98%    BMI 23.77 kg/m   BP Readings from Last 3 Encounters:  09/11/19 (!) 156/78  08/14/19 120/80  06/11/19 (!) 162/82    Wt Readings from Last 3 Encounters:  09/11/19 147 lb 4 oz (66.8 kg)  08/14/19 153 lb (69.4 kg)  06/11/19 184 lb 4 oz (83.6 kg)    Physical Exam Vitals reviewed.  HENT:     Nose: Nose normal.     Mouth/Throat:     Mouth: Mucous membranes are moist.  Eyes:     General: No scleral icterus.    Conjunctiva/sclera: Conjunctivae normal.  Cardiovascular:     Rate and Rhythm: Normal rate. Rhythm irregularly irregular.     Heart sounds: No murmur heard.   Pulmonary:     Effort: Pulmonary effort is normal.     Breath sounds: No stridor. No wheezing, rhonchi or rales.  Abdominal:      General: Abdomen is flat.     Palpations: There is no mass.     Tenderness: There is no abdominal tenderness. There is no guarding.  Musculoskeletal:        General: Normal range of motion.     Cervical back: Neck supple.     Right lower leg: No edema.     Left lower leg: No edema.  Lymphadenopathy:     Cervical: No cervical adenopathy.  Skin:    General: Skin is warm and dry.     Coloration: Skin is not pale.  Neurological:     General: No focal deficit present.     Mental Status: She is alert.  Psychiatric:        Mood and Affect: Mood normal.        Behavior: Behavior normal.     Lab Results  Component Value Date   WBC 5.9 11/12/2018   HGB 11.4 (L) 11/12/2018   HCT 36.5 11/12/2018   PLT 185.0 11/12/2018   GLUCOSE 104 (H) 06/11/2019   CHOL 154 11/12/2018   TRIG 123.0 11/12/2018   HDL 52.20 11/12/2018   LDLDIRECT 90.0 07/06/2015   LDLCALC 78 11/12/2018   ALT 11 08/22/2018   AST 16 08/22/2018   NA 142 06/11/2019   K 3.4 (L) 06/11/2019   CL 101 06/11/2019   CREATININE 3.58 (H) 06/11/2019   BUN 41 (H) 06/11/2019   CO2 29 06/11/2019   TSH 4.050 11/14/2018   INR 1.1 (H) 06/05/2018   HGBA1C 5.5 06/11/2019   MICROALBUR 133.0 (H) 06/11/2019    US Venous Img Lower Unilateral Left (DVT)  Result Date: 06/18/2019 CLINICAL DATA:  Left lower extremity pain, edema and cellulitis. EXAM: LEFT LOWER EXTREMITY VENOUS DOPPLER ULTRASOUND TECHNIQUE: Gray-scale sonography with graded compression, as well as color Doppler and duplex ultrasound were performed to evaluate the lower extremity deep venous systems from the level of the common femoral vein and including the common femoral, femoral, profunda femoral, popliteal and calf veins including the posterior tibial, peroneal and gastrocnemius veins when visible. The superficial great saphenous vein was also interrogated. Spectral Doppler was utilized to evaluate flow at rest and with distal augmentation maneuvers in the common femoral,  femoral and popliteal veins. COMPARISON:  None. FINDINGS: Contralateral Common Femoral Vein: Respiratory phasicity is normal and symmetric with the symptomatic side. No evidence of thrombus. Normal compressibility. Common Femoral Vein: No evidence of thrombus. Normal compressibility, respiratory phasicity and response to augmentation. Saphenofemoral Junction: No evidence of thrombus. Normal compressibility and flow on color Doppler imaging. Profunda Femoral Vein: No evidence of thrombus. Normal compressibility and flow on color Doppler imaging. Femoral Vein: No evidence of thrombus. Normal compressibility, respiratory phasicity  and response to augmentation. Popliteal Vein: No evidence of thrombus. Normal compressibility, respiratory phasicity and response to augmentation. Calf Veins: No evidence of thrombus. Normal compressibility and flow on color Doppler imaging. Superficial Great Saphenous Vein: No evidence of thrombus. Normal compressibility. Venous Reflux:  None. Other Findings: No evidence of superficial thrombophlebitis or abnormal fluid collection. IMPRESSION: No evidence of left lower extremity deep venous thrombosis. Electronically Signed   By: Aletta Edouard M.D.   On: 06/18/2019 10:14    Assessment & Plan:   Arden was seen today for hypertension and atrial fibrillation.  Diagnoses and all orders for this visit:  CKD (chronic kidney disease) stage 5, GFR less than 15 ml/min (HCC)- The weight loss is concerning but she is committed to not undergoing dialysis.  I requested the lab data from her recent visit with nephrology. -     Uric acid; Future  Chronic gout due to renal impairment of multiple sites without tophus- She has had no recent episodes of gout attacks.  I recommended her allopurinol dose be decreased to 100 mg a day. -     allopurinol (ZYLOPRIM) 100 MG tablet; Take 1 tablet (100 mg total) by mouth daily.  Persistent atrial fibrillation ([place])- She has good rate control.  Will  continue anticoagulation with renally dosed Eliquis.  Essential hypertension- Her blood pressure is adequately well controlled.   I have discontinued Karee C. Bradby's allopurinol, indapamide, and fluticasone. I am also having her start on allopurinol. Additionally, I am having her maintain her Multiple Vitamins-Minerals (CENTRUM SILVER PO), Bayer Contour Monitor, glucose blood, Vitamin D, acetaminophen, nebivolol, Linzess, oxyCODONE, Eliquis, famotidine, and furosemide.  Meds ordered this encounter  Medications   allopurinol (ZYLOPRIM) 100 MG tablet    Sig: Take 1 tablet (100 mg total) by mouth daily.    Dispense:  90 tablet    Refill:  1     Follow-up: Return in about 6 months (around 03/12/2020).  [name], MD

## 2019-09-11 NOTE — Patient Instructions (Signed)

## 2019-09-12 ENCOUNTER — Other Ambulatory Visit: Payer: Self-pay | Admitting: Internal Medicine

## 2019-09-13 MED ORDER — ALLOPURINOL 100 MG PO TABS: 100.0000 mg | ORAL_TABLET | Freq: Every day | ORAL | 1 refills | Status: DC

## 2019-09-27 ENCOUNTER — Other Ambulatory Visit: Payer: Self-pay | Admitting: Internal Medicine

## 2019-10-21 ENCOUNTER — Telehealth: Payer: Self-pay | Admitting: Internal Medicine

## 2019-10-21 NOTE — Telephone Encounter (Signed)
Danielle Frazier with [facility] said that this pt was not seen at their office but at the [facility] in [place], their phone number is [phone]

## 2019-10-24 DIAGNOSIS — M109 Gout, unspecified: Secondary | ICD-10-CM | POA: Diagnosis not present

## 2019-10-24 DIAGNOSIS — I120 Hypertensive chronic kidney disease with stage 5 chronic kidney disease or end stage renal disease: Secondary | ICD-10-CM | POA: Diagnosis not present

## 2019-10-24 DIAGNOSIS — N185 Chronic kidney disease, stage 5: Secondary | ICD-10-CM | POA: Diagnosis not present

## 2019-10-28 NOTE — Telephone Encounter (Signed)
Fax sent requesting records.

## 2019-11-25 ENCOUNTER — Other Ambulatory Visit: Payer: Self-pay | Admitting: Internal Medicine

## 2019-11-25 DIAGNOSIS — I10 Essential (primary) hypertension: Secondary | ICD-10-CM

## 2019-11-25 DIAGNOSIS — I4819 Other persistent atrial fibrillation: Secondary | ICD-10-CM

## 2019-11-28 ENCOUNTER — Other Ambulatory Visit: Payer: Self-pay

## 2019-11-28 ENCOUNTER — Encounter (HOSPITAL_COMMUNITY): Payer: Self-pay | Admitting: *Deleted

## 2019-11-28 ENCOUNTER — Inpatient Hospital Stay (HOSPITAL_COMMUNITY)
Admission: EM | Admit: 2019-11-28 | Discharge: 2019-12-01 | DRG: 378 | Disposition: A | Discharge status: Home / Self Care | Payer: Medicare Other | Attending: Internal Medicine | Admitting: Internal Medicine

## 2019-11-28 DIAGNOSIS — I499 Cardiac arrhythmia, unspecified: Secondary | ICD-10-CM | POA: Diagnosis not present

## 2019-11-28 DIAGNOSIS — Z833 Family history of diabetes mellitus: Secondary | ICD-10-CM

## 2019-11-28 DIAGNOSIS — M109 Gout, unspecified: Secondary | ICD-10-CM | POA: Diagnosis present

## 2019-11-28 DIAGNOSIS — N185 Chronic kidney disease, stage 5: Secondary | ICD-10-CM | POA: Diagnosis not present

## 2019-11-28 DIAGNOSIS — K625 Hemorrhage of anus and rectum: Secondary | ICD-10-CM

## 2019-11-28 DIAGNOSIS — Z743 Need for continuous supervision: Secondary | ICD-10-CM | POA: Diagnosis not present

## 2019-11-28 DIAGNOSIS — E876 Hypokalemia: Secondary | ICD-10-CM | POA: Diagnosis not present

## 2019-11-28 DIAGNOSIS — K922 Gastrointestinal hemorrhage, unspecified: Secondary | ICD-10-CM

## 2019-11-28 DIAGNOSIS — Z66 Do not resuscitate: Secondary | ICD-10-CM | POA: Diagnosis not present

## 2019-11-28 DIAGNOSIS — K5731 Diverticulosis of large intestine without perforation or abscess with bleeding: Principal | ICD-10-CM | POA: Diagnosis present

## 2019-11-28 DIAGNOSIS — K648 Other hemorrhoids: Secondary | ICD-10-CM | POA: Diagnosis present

## 2019-11-28 DIAGNOSIS — K5900 Constipation, unspecified: Secondary | ICD-10-CM | POA: Diagnosis present

## 2019-11-28 DIAGNOSIS — R58 Hemorrhage, not elsewhere classified: Secondary | ICD-10-CM | POA: Diagnosis not present

## 2019-11-28 DIAGNOSIS — K5793 Diverticulitis of intestine, part unspecified, without perforation or abscess with bleeding: Secondary | ICD-10-CM | POA: Diagnosis not present

## 2019-11-28 DIAGNOSIS — E1121 Type 2 diabetes mellitus with diabetic nephropathy: Secondary | ICD-10-CM | POA: Diagnosis not present

## 2019-11-28 DIAGNOSIS — K5791 Diverticulosis of intestine, part unspecified, without perforation or abscess with bleeding: Secondary | ICD-10-CM | POA: Diagnosis not present

## 2019-11-28 DIAGNOSIS — G8929 Other chronic pain: Secondary | ICD-10-CM | POA: Diagnosis present

## 2019-11-28 DIAGNOSIS — E1129 Type 2 diabetes mellitus with other diabetic kidney complication: Secondary | ICD-10-CM | POA: Diagnosis present

## 2019-11-28 DIAGNOSIS — R55 Syncope and collapse: Secondary | ICD-10-CM | POA: Diagnosis present

## 2019-11-28 DIAGNOSIS — I120 Hypertensive chronic kidney disease with stage 5 chronic kidney disease or end stage renal disease: Secondary | ICD-10-CM | POA: Diagnosis not present

## 2019-11-28 DIAGNOSIS — I959 Hypotension, unspecified: Secondary | ICD-10-CM | POA: Diagnosis not present

## 2019-11-28 DIAGNOSIS — E1122 Type 2 diabetes mellitus with diabetic chronic kidney disease: Secondary | ICD-10-CM | POA: Diagnosis not present

## 2019-11-28 DIAGNOSIS — D62 Acute posthemorrhagic anemia: Secondary | ICD-10-CM | POA: Diagnosis present

## 2019-11-28 DIAGNOSIS — D539 Nutritional anemia, unspecified: Secondary | ICD-10-CM | POA: Diagnosis present

## 2019-11-28 DIAGNOSIS — K559 Vascular disorder of intestine, unspecified: Secondary | ICD-10-CM | POA: Diagnosis present

## 2019-11-28 DIAGNOSIS — Z888 Allergy status to other drugs, medicaments and biological substances status: Secondary | ICD-10-CM

## 2019-11-28 DIAGNOSIS — Z7901 Long term (current) use of anticoagulants: Secondary | ICD-10-CM | POA: Diagnosis not present

## 2019-11-28 DIAGNOSIS — R9431 Abnormal electrocardiogram [ECG] [EKG]: Secondary | ICD-10-CM | POA: Diagnosis present

## 2019-11-28 DIAGNOSIS — E785 Hyperlipidemia, unspecified: Secondary | ICD-10-CM | POA: Diagnosis present

## 2019-11-28 DIAGNOSIS — D631 Anemia in chronic kidney disease: Secondary | ICD-10-CM | POA: Diagnosis not present

## 2019-11-28 DIAGNOSIS — Z20822 Contact with and (suspected) exposure to covid-19: Secondary | ICD-10-CM | POA: Diagnosis present

## 2019-11-28 DIAGNOSIS — K222 Esophageal obstruction: Secondary | ICD-10-CM | POA: Diagnosis present

## 2019-11-28 DIAGNOSIS — G4701 Insomnia due to medical condition: Secondary | ICD-10-CM | POA: Diagnosis present

## 2019-11-28 DIAGNOSIS — I4819 Other persistent atrial fibrillation: Secondary | ICD-10-CM | POA: Diagnosis not present

## 2019-11-28 DIAGNOSIS — R42 Dizziness and giddiness: Secondary | ICD-10-CM | POA: Diagnosis not present

## 2019-11-28 DIAGNOSIS — Z8249 Family history of ischemic heart disease and other diseases of the circulatory system: Secondary | ICD-10-CM

## 2019-11-28 DIAGNOSIS — Z79899 Other long term (current) drug therapy: Secondary | ICD-10-CM

## 2019-11-28 DIAGNOSIS — Z7984 Long term (current) use of oral hypoglycemic drugs: Secondary | ICD-10-CM

## 2019-11-28 DIAGNOSIS — C4490 Unspecified malignant neoplasm of skin, unspecified: Secondary | ICD-10-CM | POA: Diagnosis present

## 2019-11-28 DIAGNOSIS — I10 Essential (primary) hypertension: Secondary | ICD-10-CM | POA: Diagnosis present

## 2019-11-28 DIAGNOSIS — K921 Melena: Secondary | ICD-10-CM | POA: Diagnosis not present

## 2019-11-28 DIAGNOSIS — K219 Gastro-esophageal reflux disease without esophagitis: Secondary | ICD-10-CM | POA: Diagnosis not present

## 2019-11-28 DIAGNOSIS — R404 Transient alteration of awareness: Secondary | ICD-10-CM | POA: Diagnosis not present

## 2019-11-28 LAB — COMPREHENSIVE METABOLIC PANEL
ALT: 15 U/L (ref 0–44)
AST: 23 U/L (ref 15–41)
Albumin: 3.2 g/dL — ABNORMAL LOW (ref 3.5–5.0)
Alkaline Phosphatase: 125 U/L (ref 38–126)
Anion gap: 11 (ref 5–15)
BUN: 68 mg/dL — ABNORMAL HIGH (ref 8–23)
CO2: 22 mmol/L (ref 22–32)
Calcium: 10.3 mg/dL (ref 8.9–10.3)
Chloride: 108 mmol/L (ref 98–111)
Creatinine, Ser: 4.46 mg/dL — ABNORMAL HIGH (ref 0.44–1.00)
GFR calc Af Amer: 10 mL/min — ABNORMAL LOW (ref >60)
GFR calc non Af Amer: 8 mL/min — ABNORMAL LOW (ref >60)
Glucose, Bld: 156 mg/dL — ABNORMAL HIGH (ref 70–99)
Potassium: 3.3 mmol/L — ABNORMAL LOW (ref 3.5–5.1)
Sodium: 141 mmol/L (ref 135–145)
Total Bilirubin: 0.3 mg/dL (ref 0.3–1.2)
Total Protein: 6.1 g/dL — ABNORMAL LOW (ref 6.5–8.1)

## 2019-11-28 LAB — CBC WITH DIFFERENTIAL/PLATELET
Abs Immature Granulocytes: 0.03 10*3/uL (ref 0.00–0.07)
Basophils Absolute: 0 10*3/uL (ref 0.0–0.1)
Basophils Relative: 1 %
Eosinophils Absolute: 0.1 10*3/uL (ref 0.0–0.5)
Eosinophils Relative: 2 %
HCT: 33.3 % — ABNORMAL LOW (ref 36.0–46.0)
Hemoglobin: 10.2 g/dL — ABNORMAL LOW (ref 12.0–15.0)
Immature Granulocytes: 0 %
Lymphocytes Relative: 19 %
Lymphs Abs: 1.3 10*3/uL (ref 0.7–4.0)
MCH: 33.2 pg (ref 26.0–34.0)
MCHC: 30.6 g/dL (ref 30.0–36.0)
MCV: 108.5 fL — ABNORMAL HIGH (ref 80.0–100.0)
Monocytes Absolute: 0.4 10*3/uL (ref 0.1–1.0)
Monocytes Relative: 6 %
Neutro Abs: 5 10*3/uL (ref 1.7–7.7)
Neutrophils Relative %: 72 %
Platelets: 217 10*3/uL (ref 150–400)
RBC: 3.07 MIL/uL — ABNORMAL LOW (ref 3.87–5.11)
RDW: 13.6 % (ref 11.5–15.5)
WBC: 7 10*3/uL (ref 4.0–10.5)
nRBC: 0 % (ref 0.0–0.2)

## 2019-11-28 LAB — CBC
HCT: 28.3 % — ABNORMAL LOW (ref 36.0–46.0)
Hemoglobin: 8.7 g/dL — ABNORMAL LOW (ref 12.0–15.0)
MCH: 32.3 pg (ref 26.0–34.0)
MCHC: 30.7 g/dL (ref 30.0–36.0)
MCV: 105.2 fL — ABNORMAL HIGH (ref 80.0–100.0)
Platelets: 195 10*3/uL (ref 150–400)
RBC: 2.69 MIL/uL — ABNORMAL LOW (ref 3.87–5.11)
RDW: 13.6 % (ref 11.5–15.5)
WBC: 7.4 10*3/uL (ref 4.0–10.5)
nRBC: 0 % (ref 0.0–0.2)

## 2019-11-28 LAB — PROTIME-INR
INR: 1.2 (ref 0.8–1.2)
Prothrombin Time: 14.9 seconds (ref 11.4–15.2)

## 2019-11-28 LAB — SARS CORONAVIRUS 2 BY RT PCR (HOSPITAL ORDER, PERFORMED IN ~~LOC~~ HOSPITAL LAB): SARS Coronavirus 2: NEGATIVE

## 2019-11-28 LAB — POC OCCULT BLOOD, ED: Fecal Occult Bld: POSITIVE — AB

## 2019-11-28 MED ORDER — NEBIVOLOL HCL 10 MG PO TABS
10.0000 mg | ORAL_TABLET | Freq: Every day | ORAL | Status: DC
  Administered 2019-11-28 – 2019-12-01 (×4): 10 mg via ORAL
  Filled 2019-11-28 (×5): qty 1

## 2019-11-28 MED ORDER — ALLOPURINOL 100 MG PO TABS
100.0000 mg | ORAL_TABLET | Freq: Every day | ORAL | Status: DC
  Administered 2019-11-28 – 2019-12-01 (×3): 100 mg via ORAL
  Filled 2019-11-28 (×4): qty 1

## 2019-11-28 MED ORDER — ACETAMINOPHEN 500 MG PO TABS
1000.0000 mg | ORAL_TABLET | Freq: Two times a day (BID) | ORAL | Status: DC
  Administered 2019-11-28 – 2019-12-01 (×7): 1000 mg via ORAL
  Filled 2019-11-28 (×7): qty 2

## 2019-11-28 MED ORDER — LACTATED RINGERS IV SOLN: INTRAVENOUS | Status: DC

## 2019-11-28 MED ORDER — ACETAMINOPHEN 325 MG PO TABS: 650.0000 mg | ORAL_TABLET | Freq: Four times a day (QID) | ORAL | Status: DC | PRN

## 2019-11-28 MED ORDER — ACETAMINOPHEN 650 MG RE SUPP: 650.0000 mg | Freq: Four times a day (QID) | RECTAL | Status: DC | PRN

## 2019-11-28 MED ORDER — OXYCODONE HCL 5 MG PO TABS: 5.0000 mg | ORAL_TABLET | Freq: Four times a day (QID) | ORAL | Status: DC | PRN

## 2019-11-28 NOTE — H&P (Addendum)
History and Physical    Danielle Frazier:[id number] DOB: Jan 31, 1931 DOA: 11/28/2019  PCP: [name], MD Consultants:  [facility] - E[facility]; last colonoscopy was at [facility] in 2004; [facility] - cardiology; [facility] - nephrology Patient coming from:  Home - lives with husband; NOK: Alda Berthold, [phone]  Chief Complaint: Rectal bleeding  HPI: Danielle Frazier is a 86 y.o. female with medical history significant of stage 5 CKD; HTN; HLD; afib on Eliquis; and DM presenting with rectal bleeding.  She was previously admitted for this problem in 05/2018 and had unremarkable EGD; bleeding was thought to be diverticular in nature and she was treated with antibiotics. She reports last night she woke up and went to the bathroom.  She had no idea it was going to be bloody but it was frank blood.  It happened every hour after that until they finally called 911.  She blacked out walking into the bathroom and fell into the floor and so family called 911.    ED Course: h/o GIB, normal EGD, CT with diverticulitis so assumed diverticular in nature.  Had large volume dark red blood overnight.  On Eliquis, last dose yesterday AM.  No pain, feeling better.  Rectal with dark red blood on exam.  Review of Systems: As per HPI; otherwise review of systems reviewed and negative.   Ambulatory Status:  Ambulates without assistance or with a cane for balance  COVID Vaccine Status:  Complete  Past Medical History:  Diagnosis Date  . Abdominal tenderness, RLQ (right lower quadrant) 04/24/2013  . Acute bacterial sinusitis 03/22/2013  . Acute bronchitis 03/24/2018   1/20  . Anemia associated with acute blood loss 06/04/2018  . Bilateral renal cysts 09/07/2011  . BRBPR (bright red blood per rectum) 06/04/2018  . Cerumen impaction 03/24/2018   1/20 L  . CKD (chronic kidney disease) stage 5, GFR less than 15 ml/min (HCC) 08/25/2008   Estimated Creatinine Clearance: 12.7 mL/min (A) (by C-G formula based on SCr of 3.58 mg/dL  (H)).  . Constipation, chronic 04/24/2013  . Diabetes mellitus    type II  . 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 DM (diabetes mellitus), type 2 with renal complications ([place]) 5/46/2703  . ELECTROCARDIOGRAM, ABNORMAL 08/25/2008   Qualifier: Diagnosis of  By: Ronnald Ramp MD, Arvid Right.   . Essential hypertension 08/25/2008  . Gastroesophageal reflux disease without esophagitis 07/06/2015  . GI bleeding 06/07/2018  . Gout   . GOUT 08/25/2008       . Greater trochanteric bursitis of right hip 11/07/2017   Injected November 07, 2017 repeat injection Jul 13, 2018 repeat injection September 20, 2018  . Hip pain, chronic, right 07/23/2012   Dg Hip Complete Right  07/23/2012   *RADIOLOGY REPORT*  Clinical Data: Right hip pain  RIGHT HIP - COMPLETE 2+ VIEW  Comparison: None.  Findings: No fracture or dislocation is seen.  Visualized bony pelvis appears intact.  Bilateral hip joint spaces are symmetric.  Degenerative changes of the lower lumbar spine.  IMPRESSION: No acute osseous abnormality is seen.   Original Report Authenticated By:   . Hyperlipidemia   . Hyperlipidemia with target LDL less than 100 12/30/2009  . Hypertension   . Insomnia secondary to chronic pain 07/11/2018  . LBP (low back pain)    foraminal narrowing at L4-5 and disc bulge at L5- S1 on MRI 6-08  . Lumbar radiculopathy 12/11/2017  . Morbid obesity ([place]) 03/22/2013  . Otitis media 03/24/2018   1/20 R and  possibly L  . Persistent atrial fibrillation ([place]) 06/07/2018  . Renal insufficiency   . Right hip pain 10/16/2017  . Shoulder injury 05/21/2012  . Urinary incontinence   . Uterine fibroid 05/16/2013  . UTI (lower urinary tract infection) 04/24/2013    Past Surgical History:  Procedure Laterality Date  . ESOPHAGOGASTRODUODENOSCOPY (EGD) WITH PROPOFOL N/A 06/08/2018   Procedure: ESOPHAGOGASTRODUODENOSCOPY (EGD) WITH PROPOFOL;  Surgeon: [name], MD;  Location: [facility];  Service: Gastroenterology;  Laterality: N/A;    Social History    Socioeconomic History  . Marital status: Married    Spouse name: Not on file  . Number of children: Not on file  . Years of education: Not on file  . Highest education level: Not on file  Occupational History  . Not on file  Tobacco Use  . Smoking status: Never Smoker  . Smokeless tobacco: Never Used  Vaping Use  . Vaping Use: Never used  Substance and Sexual Activity  . Alcohol use: No  . Drug use: No  . Sexual activity: Not Currently  Other Topics Concern  . Not on file  Social History Narrative  . Not on file   Social Determinants of Health   Financial Resource Strain:   . Difficulty of Paying Living Expenses: Not on file  Food Insecurity:   . Worried About Charity fundraiser in the Last Year: Not on file  . Ran Out of Food in the Last Year: Not on file  Transportation Needs:   . Lack of Transportation (Medical): Not on file  . Lack of Transportation (Non-Medical): Not on file  Physical Activity:   . Days of Exercise per Week: Not on file  . Minutes of Exercise per Session: Not on file  Stress:   . Feeling of Stress : Not on file  Social Connections:   . Frequency of Communication with Friends and Family: Not on file  . Frequency of Social Gatherings with Friends and Family: Not on file  . Attends Religious Services: Not on file  . Active Member of Clubs or Organizations: Not on file  . Attends Archivist Meetings: Not on file  . Marital Status: Not on file  Intimate Partner Violence:   . Fear of Current or Ex-Partner: Not on file  . Emotionally Abused: Not on file  . Physically Abused: Not on file  . Sexually Abused: Not on file    Allergies  Allergen Reactions  . Metformin Other (See Comments)    REACTION: renal damage  . Crestor [Rosuvastatin] Other (See Comments)    Muscle aches    Family History  Problem Relation Age of Onset  . Diabetes Other   . Hypertension Other     Prior to Admission medications   Medication Sig Start Date  End Date Taking? Authorizing Provider  acetaminophen (TYLENOL) 650 MG CR tablet Take 650 mg by mouth every 8 (eight) hours as needed for pain.    [provider]  allopurinol (ZYLOPRIM) 100 MG tablet Take 1 tablet (100 mg total) by mouth daily. 09/13/19   [name], MD  Blood Glucose Monitoring Suppl (BAYER CONTOUR MONITOR) W/DEVICE KIT 1 Act by Does not apply route 2 (two) times daily. 05/06/11   [name], MD  BYSTOLIC 10 MG tablet TAKE 1 TABLET BY MOUTH EVERY DAY 11/25/19   [name], MD  Cholecalciferol (VITAMIN D) 50 MCG (2000 UT) tablet Take 2,000 Units by mouth daily.    [provider]  ELIQUIS 2.5 MG TABS tablet TAKE 1 TABLET BY MOUTH TWICE A DAY 06/19/19   [name], MD  famotidine (PEPCID) 40 MG tablet Take 40 mg by mouth daily. 07/15/19   [provider]  furosemide (LASIX) 40 MG tablet Take 40 mg by mouth daily. 08/22/19   [provider]  glucose blood (BAYER CONTOUR TEST) test strip Use BID 05/06/11   [name], MD  LINZESS 145 MCG CAPS capsule TAKE 1 CAPSULE BY MOUTH EVERY DAY 03/13/19   [name], MD  Multiple Vitamins-Minerals (CENTRUM SILVER PO) Take 1 tablet by mouth daily.      [provider]  oxyCODONE (OXY IR/ROXICODONE) 5 MG immediate release tablet Take 1 tablet (5 mg total) by mouth every 6 (six) hours as needed for severe pain. 06/11/19   [name], MD  apixaban (ELIQUIS) 2.5 MG TABS tablet Take 1 tablet (2.5 mg total) by mouth 2 (two) times daily. 12/12/18   [name], MD  nebivolol (BYSTOLIC) 10 MG tablet Take 10 mg by mouth 2 (two) times daily.    [provider]    Physical Exam: Vitals:   11/28/19 0600 11/28/19 0645 11/28/19 0825 11/28/19 1025  BP: (!) 143/57 (!) 166/75 (!) 128/98 (!) 154/97  Pulse: (!) 47 80 79 75  Resp: '17 10 16 18  ' Temp:   98 F (36.7 C)   TempSrc:   Oral   SpO2: 99% 100% 100% 100%  Weight:      Height:         . General:  Appears calm and  comfortable and is NAD . Eyes:  PERRL, EOMI, normal lids, iris . ENT:  grossly normal hearing, lips & tongue, mmm; appropriate dentition . Neck:  no LAD, masses or thyromegaly . Cardiovascular:  RRR, no m/r/g. No LE edema.  Marland Kitchen Respiratory:   CTA bilaterally with no wheezes/rales/rhonchi.  Normal respiratory effort. . Abdomen:  soft, NT, ND, NABS . Skin:  no rash or induration seen on limited exam . Musculoskeletal:  grossly normal tone BUE/BLE, good ROM, no bony abnormality . Psychiatric:  grossly normal mood and affect, speech fluent and appropriate, AOx3 . Neurologic:  CN 2-12 grossly intact, moves all extremities in coordinated fashion    Radiological Exams on Admission: No results found.  EKG: Independently reviewed.  Afib with rate 67; prolonged QTc 604; no evidence of acute ischemia   Labs on Admission: I have personally reviewed the available labs and imaging studies at the time of the admission.  Pertinent labs:   K+ 3.3 Glucose 156 BUN 68/Creatinine 4.46/GFR 8 WBC 7.0 Hgb 10.2; 11.4 in 10/2018 Heme positive COVID negative   Assessment/Plan Principal Problem:   GI bleed Active Problems:   DM (diabetes mellitus), type 2 with renal complications (HCC)   Essential hypertension   CKD (chronic kidney disease) stage 5, GFR less than 15 ml/min (HCC)   Persistent atrial fibrillation (HCC)   Anemia due to stage 5 chronic kidney disease (HCC)   Prolonged QT interval   DNR (do not resuscitate)   GI bleeding -Patient gives a h/o painless, dark red rectal bleeding which started overnight -Syncopal associated thought to be associated with rapid blood loss -Prior h/o bleeding in 05/2018 was thought to be diverticular in nature, and likely is again -Her bleeding no longer appears to be brisk enough to make tagged RBC scan useful at this time -She may not need further intervention, as her bleeding appears to  be slowed -Will keep NPO for now -Given the likelihood of lower GI  blood loss in conjunction with her significantly prolonged QTc, will hold PPI at this time -GI has been consulted -She is afebrile at this time without tachycardia, and no leukocytosis; will not give antibiotics at this time.  -Hold Eliquis -Will observe on Med Surg at this time  Chronic anemia -Patient has advanced CKD  -Mildly worse anemia now, likely significant acute blood loss that has not had time to equilibrate -Recheck CBC at noon and in AM; may need to follow more frequently if bleeding resumes -If Hgb <7, transfuse  Prolonged QTc -Likely associated with acute volume loss, anticipate resolution once volume status is normalized -Will attempt to avoid QT-prolonging medications such as PPI, nausea meds, SSRIs -Repeat EKG in AM  Afib -Patient with chronic afib -She is rate controlled; Bystolic will be continued -Hold Eliquis given her acute bleeding; this has been discussed with the patient  CKD -Patient with stage 5 CKD -She continues to make urine and is not planning to pursue HD -Has gout associated with renal impairment; continue Allopurinol  HTN -Continue Bystolic  DM -Recent I2M was 5.5 on 3/30 -She does not appear to be taking medications for this issue -Given the unlikely nature of long-term sequelae of uncontrolled long-term hyperglycemia in this patient, this is likely little utility in continuing to monitor this issue over time.  DNR -I have discussed code status with the patient and she would not desire resuscitation and would prefer to die a natural death should that situation arise. -She will need a gold out of facility DNR form at the time of discharge    Note: This patient has been tested and is negative for the novel coronavirus COVID-19.    DVT prophylaxis:  SCDs Code Status:  DNR - confirmed with patient Family Communication: None present Disposition Plan:  Home once clinically improved Consults called: GI  Admission status: It is my  clinical opinion that referral for OBSERVATION is reasonable and necessary in this patient based on the above information provided. The aforementioned taken together are felt to place the patient at high risk for further clinical deterioration. However it is anticipated that the patient may be medically stable for discharge from the hospital within 24 to 48 hours.   Karmen Bongo MD [facility]   How to contact the [facility] Attending or Consulting provider [place] or covering provider during after hours [place], for this patient?  1. Check the care team in [facility] and look for a) attending/consulting TR[facility] provider listed and b) the [facility] team listed 2. Log into [email or web address] and use [place]'s universal password to access. If you do not have the password, please contact the hospital operator. 3. Locate the [facility] provider you are looking for under [facility] and page to a number that you can be directly reached. 4. If you still have difficulty reaching the provider, please page the [facility] (Director on Call) for the Hospitalists listed on amion for assistance.   11/28/2019, 10:28 AM

## 2019-11-28 NOTE — ED Notes (Signed)
Patient resting on bed no complaints at present. Husband was here and left will come back later.

## 2019-11-28 NOTE — ED Triage Notes (Signed)
The pt arrived by gems from home   She started having blody diarrhea that started at 66 yesterday am and she continued to have the diarrhea  And at 0300 whe went to the br a nd passed out    bp in the low 90s when ems arrived

## 2019-11-28 NOTE — Consult Note (Addendum)
[facility] Consult: 10:06 AM 11/28/2019  LOS: 0 days    Referring Provider: Dr. Karmen Bongo Primary Care Physician:  [name], MD Primary Gastroenterologist:  Dr Henrene Pastor     Reason for Consultation: Hematochezia, bloody diarrhea.   HPI: Danielle Frazier is a 86 y.o. female.  PMH listed below. CKD 5.  NIDDM.  A. fib diagnosed in 05/2018.  Constipation.  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 Med list includes both Eliquis (initiated 11/2018) and Linzess for several years. Presumed diverticular bleed in 05/2018.  Received 1 PRBC for blood loss anemia with background of anemia of chronic disease.  Hb went from 6.6 >> 8.7.  2005 colonoscopy.  For screening purposes, Aunt w hx colorectal neoplasia.  Dr. Henrene Pastor.  Findings showed marked descending to sigmoid: Diverticulosis, nonbleeding internal hemorrhoids 05/2018 CTAP w/o contrast: For rectal bleeding.  Colon diverticulosis, prominent in sigmoid.  Mild pericolonic stranding and edema in distal descending colon adjacent to diverticula suspicious for uncomplicated acute diverticulitis.  Completed a course of Cipro/Flagyl. 05/2018 EGD for evaluation of hematochezia, melena, blood loss anemia..  Dr. Tarri Glenn.  There was a nonobstructing Schatzki ring and small HH.  Otherwise normal study.  ~ 1030 PM yest onset painless hematochezia mixed w stool.   Episodes ~ every hour x 4 or 5. At 3 AM this morning she went to the bathroom, passed blood and had a syncopal spell with brief LOC.  She senses the syncope coming on and was able to get herself onto the ground so did not sustain any fall related trauma.  SBP in the low 90s at EMS arrival. No BM or bleeding since 3 AM.  Feels well now.  As long as she takes her Linzess, she has daily brown stools.  Denies anorexia, nausea, abdominal pain,  GI distress.  Hgb 10.2, 17 months ago it range 10-11.4. MCV 108, was in the mid 90s. In March 2021 she had normal iron, normal ferritin, normal iron saturation, normal folate, normal B12. GFR 8, C/W 11 to 12 a year ago.  Proportional increase of BUN/creatinine.   Last dose of Eliquis was a.m. 9/15.  She uses Tylenol twice a day for arthritis pain but no NSAIDs.  Has never been treated with Epogen or other's ESA agents nor received parenteral iron.  She does recall blood transfusion in 05/2018 at the time of the diverticular bleed.  Patient experiences occasional brief pain in her lower right abdomen/pelvic region associated with coughing.  When she palpates the area it is not tender and she cannot feel any masses.  She is curious what this might be.  Lives at home with her 25 year old husband.  Between the 2 of them they perform all household ADLs and cooking.  Both of them still drive though she does not drive much at all.  Past Medical History:  Diagnosis Date  . Abdominal tenderness, RLQ (right lower quadrant) 04/24/2013  . Acute bacterial sinusitis 03/22/2013  . Acute bronchitis 03/24/2018   1/20  . Anemia associated with acute blood loss 06/04/2018  . Bilateral renal cysts  09/07/2011  . BRBPR (bright red blood per rectum) 06/04/2018  . Cerumen impaction 03/24/2018   1/20 L  . CKD (chronic kidney disease) stage 5, GFR less than 15 ml/min (HCC) 08/25/2008   Estimated Creatinine Clearance: 12.7 mL/min (A) (by C-G formula based on SCr of 3.58 mg/dL (H)).  . Constipation, chronic 04/24/2013  . Diabetes mellitus    type II  . 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 DM (diabetes mellitus), type 2 with renal complications ([place]) 0/93/8182  . ELECTROCARDIOGRAM, ABNORMAL 08/25/2008   Qualifier: Diagnosis of  By: Ronnald Ramp MD, Arvid Right.   . Essential hypertension 08/25/2008  . Gastroesophageal reflux disease without esophagitis 07/06/2015  . GI bleeding 06/07/2018  . Gout   . GOUT 08/25/2008       . Greater  trochanteric bursitis of right hip 11/07/2017   Injected November 07, 2017 repeat injection Jul 13, 2018 repeat injection September 20, 2018  . Hip pain, chronic, right 07/23/2012   Dg Hip Complete Right  07/23/2012   *RADIOLOGY REPORT*  Clinical Data: Right hip pain  RIGHT HIP - COMPLETE 2+ VIEW  Comparison: None.  Findings: No fracture or dislocation is seen.  Visualized bony pelvis appears intact.  Bilateral hip joint spaces are symmetric.  Degenerative changes of the lower lumbar spine.  IMPRESSION: No acute osseous abnormality is seen.   Original Report Authenticated By:   . Hyperlipidemia   . Hyperlipidemia with target LDL less than 100 12/30/2009  . Hypertension   . Insomnia secondary to chronic pain 07/11/2018  . LBP (low back pain)    foraminal narrowing at L4-5 and disc bulge at L5- S1 on MRI 6-08  . Lumbar radiculopathy 12/11/2017  . Morbid obesity ([place]) 03/22/2013  . Otitis media 03/24/2018   1/20 R and possibly L  . Persistent atrial fibrillation ([place]) 06/07/2018  . Renal insufficiency   . Right hip pain 10/16/2017  . Shoulder injury 05/21/2012  . Urinary incontinence   . Uterine fibroid 05/16/2013  . UTI (lower urinary tract infection) 04/24/2013    Past Surgical History:  Procedure Laterality Date  . ESOPHAGOGASTRODUODENOSCOPY (EGD) WITH PROPOFOL N/A 06/08/2018   Procedure: ESOPHAGOGASTRODUODENOSCOPY (EGD) WITH PROPOFOL;  Surgeon: [name], MD;  Location: [facility];  Service: Gastroenterology;  Laterality: N/A;    Prior to Admission medications   Medication Sig Start Date End Date Taking? Authorizing Provider  acetaminophen (TYLENOL) 500 MG tablet Take 1,000 mg by mouth in the morning and at bedtime.   Yes [provider]  allopurinol (ZYLOPRIM) 100 MG tablet Take 1 tablet (100 mg total) by mouth daily. 09/13/19  Yes [name], MD  Blood Glucose Monitoring Suppl (BAYER CONTOUR MONITOR) W/DEVICE KIT 1 Act by Does not apply route 2 (two) times daily. 05/06/11  Yes [name], MD  BYSTOLIC 10 MG tablet TAKE 1 TABLET BY MOUTH EVERY DAY Patient taking differently: Take 10 mg by mouth daily.  11/25/19  Yes [name], MD  Cholecalciferol (VITAMIN D) 50 MCG (2000 UT) tablet Take 2,000 Units by mouth daily.   Yes [provider]  ELIQUIS 2.5 MG TABS tablet TAKE 1 TABLET BY MOUTH TWICE A DAY Patient taking differently: Take 2.5 mg by mouth 2 (two) times daily.  06/19/19  Yes [name], MD  famotidine (PEPCID) 40 MG tablet Take 40 mg by mouth daily. 07/15/19  Yes [provider]  fluticasone (FLONASE) 50 MCG/ACT nasal spray Place 2 sprays into both nostrils as needed for allergies.  09/11/19  Yes [provider]  furosemide (LASIX) 40 MG tablet Take 40 mg by mouth daily. 08/22/19  Yes [provider]  glucose blood (BAYER CONTOUR TEST) test strip Use BID 05/06/11  Yes [name], MD  LINZESS 145 MCG CAPS capsule TAKE 1 CAPSULE BY MOUTH EVERY DAY Patient taking differently: Take 145 mcg by mouth daily.  03/13/19  Yes [name], MD  Multiple Vitamins-Minerals (CENTRUM SILVER PO) Take 1 tablet by mouth daily.     Yes [provider]  oxyCODONE (OXY IR/ROXICODONE) 5 MG immediate release tablet Take 1 tablet (5 mg total) by mouth every 6 (six) hours as needed for severe pain. Patient not taking: Reported on 11/28/2019 06/11/19   [name], MD  apixaban (ELIQUIS) 2.5 MG TABS tablet Take 1 tablet (2.5 mg total) by mouth 2 (two) times daily. 12/12/18   [name], MD  nebivolol (BYSTOLIC) 10 MG tablet Take 10 mg by mouth 2 (two) times daily.    [provider]    Scheduled Meds: . allopurinol  100 mg Oral Daily  . nebivolol  10 mg Oral Daily   Infusions: . lactated ringers 100 mL/hr at 11/28/19 0835   PRN Meds: acetaminophen **OR** acetaminophen   Allergies as of 11/28/2019 - Review Complete 11/28/2019  Allergen Reaction Noted  . Metformin Other (See Comments) 01/22/2009  . Crestor  [rosuvastatin] Other (See Comments) 09/07/2011    Family History  Problem Relation Age of Onset  . Diabetes Other   . Hypertension Other     Social History   Socioeconomic History  . Marital status: Married    Spouse name: Not on file  . Number of children: Not on file  . Years of education: Not on file  . Highest education level: Not on file  Occupational History  . Not on file  Tobacco Use  . Smoking status: Never Smoker  . Smokeless tobacco: Never Used  Vaping Use  . Vaping Use: Never used  Substance and Sexual Activity  . Alcohol use: No  . Drug use: No  . Sexual activity: Not Currently  Other Topics Concern  . Not on file  Social History Narrative  . Not on file   Social Determinants of Health   Financial Resource Strain:   . Difficulty of Paying Living Expenses: Not on file  Food Insecurity:   . Worried About Charity fundraiser in the Last Year: Not on file  . Ran Out of Food in the Last Year: Not on file  Transportation Needs:   . Lack of Transportation (Medical): Not on file  . Lack of Transportation (Non-Medical): Not on file  Physical Activity:   . Days of Exercise per Week: Not on file  . Minutes of Exercise per Session: Not on file  Stress:   . Feeling of Stress : Not on file  Social Connections:   . Frequency of Communication with Friends and Family: Not on file  . Frequency of Social Gatherings with Friends and Family: Not on file  . Attends Religious Services: Not on file  . Active Member of Clubs or Organizations: Not on file  . Attends Archivist Meetings: Not on file  . Marital Status: Not on file  Intimate Partner Violence:   . Fear of Current or Ex-Partner: Not on file  . Emotionally Abused: Not on file  . Physically Abused: Not on file  . Sexually Abused: Not on file    REVIEW  OF SYSTEMS: Constitutional: Other than the dizziness, brief syncope last night, she does not suffer from weakness.  She feels she has normal  age-related fatigue, not profound. ENT:  No nose bleeds Pulm: No shortness of breath, no cough CV:  No palpitations, no LE edema.  No angina GU:  No hematuria, no frequency GI: See HPI. Heme: Denies unusual or excessive bleeding other than this current hematochezia. Transfusions: Received PRBC Neuro:  No headaches, no peripheral tingling or numbness Derm:  No itching, no rash or sores.  Endocrine:  No sweats or chills.  No polyuria or dysuria Immunization: Just recently completed two-stage COVID-19 vaccination. Travel:  None beyond local counties in last few months.    PHYSICAL EXAM: Vital signs in last 24 hours: Vitals:   11/28/19 0645 11/28/19 0825  BP: (!) 166/75 (!) 128/98  Pulse: 80 79  Resp: 10 16  Temp:  98 F (36.7 C)  SpO2: 100% 100%   Wt Readings from Last 3 Encounters:  11/28/19 68 kg  09/11/19 66.8 kg  08/14/19 69.4 kg    General: Pleasant, elderly WF.  Comfortable.  Looks better than expected after having read her chart.  Alert and able to provide excellent history Head: No facial asymmetry or swelling.  No signs of head trauma. Eyes: No scleral icterus.  No conjunctival pallor Ears: Not hard of hearing Nose: No congestion or discharge Mouth: Oropharynx is moist, pink, clear.  Tongue midline.  Some missing teeth but overall moderate dental health. Neck: No JVD, no masses, no thyromegaly Lungs: Clear bilaterally without labored breathing or cough. Heart: Irregularly irregular rhythm in the 80s.  No MRG.  S1, S2 present Abdomen: Soft without tenderness.  No distention.  No HSM, masses, bruits, hernias.  Active bowel sounds.   Rectal: Deferred rectal exam.  Performed by Dr. Betsey Holiday and there was gross, dark red blood. Musc/Skeltl: No joint redness, swelling or gross deformity Extremities: No CCE. Neurologic: Alert.  Oriented x3.  No gross weakness or deficits.  No tremors Skin: No telangiectasia, rash, sores, suspicious lesions. Tattoos: None Nodes: No  cervical adenopathy Psych: Calm, cooperative, pleasant, fluid speech.  Intake/Output from previous day: No intake/output data recorded. Intake/Output this shift: No intake/output data recorded.  LAB RESULTS: Recent Labs    11/28/19 0530  WBC 7.0  HGB 10.2*  HCT 33.3*  PLT 217   BMET Lab Results  Component Value Date   NA 141 11/28/2019   NA 142 06/11/2019   NA 142 11/23/2018   K 3.3 (L) 11/28/2019   K 3.4 (L) 06/11/2019   K 3.5 11/23/2018   CL 108 11/28/2019   CL 101 06/11/2019   CL 108 (H) 11/23/2018   CO2 22 11/28/2019   CO2 29 06/11/2019   CO2 21 11/23/2018   GLUCOSE 156 (H) 11/28/2019   GLUCOSE 104 (H) 06/11/2019   GLUCOSE 93 11/23/2018   BUN 68 (H) 11/28/2019   BUN 41 (H) 06/11/2019   BUN 49 (H) 11/23/2018   CREATININE 4.46 (H) 11/28/2019   CREATININE 3.58 (H) 06/11/2019   CREATININE 3.30 (H) 11/23/2018   CALCIUM 10.3 11/28/2019   CALCIUM 10.8 (H) 06/11/2019   CALCIUM 10.4 (H) 11/23/2018   LFT Recent Labs    11/28/19 0530  PROT 6.1*  ALBUMIN 3.2*  AST 23  ALT 15  ALKPHOS 125  BILITOT 0.3   PT/INR Lab Results  Component Value Date   INR 1.2 11/28/2019   INR 1.1 (H) 06/05/2018   Hepatitis Panel No results  for input(s): HEPBSAG, HCVAB, HEPAIGM, HEPBIGM in the last 72 hours. C-Diff No components found for: CDIFF Lipase     Component Value Date/Time   LIPASE 59.0 04/24/2013 1009    Drugs of Abuse  No results found for: LABOPIA, COCAINSCRNUR, LABBENZ, AMPHETMU, THCU, LABBARB   RADIOLOGY STUDIES: No results found.    IMPRESSION:   *    Painless lower GI bleeding.  Suspect diverticular etiology.  *   Hypotension, brief syncope.  No associated fall or trauma.  *    Atrial fibrillation, onset 05/2018.  Eliquis initiated 11/2018.  Last dose AM 9/15  *   Macrocytic anemia.  Fairly stable over the last 18 months.  Do not see any CSA agents in use or treatments with IV iron.  *     Stage 5 hypertensive CKD.  Followed by Dr. Moshe Cipro.   Per note of 09/11/2019 she has decided not to undergo hemodialysis.      PLAN:     *    Observation. CTAP with angiography is contraindicated given her late stage CKD and additionally its been 8 hours since she last passed blood.  Nuclear medicine RBC scan also likely to be unhelpful given the 8 hours since last episode of hematochezia.  *   Continue to hold Eliquis.  May need to revisit risk/benefit calculus of this med.  *    Clear liquids for now.  Suspect we can advance her to a solid diet within the next 12 hours.  *   CBC is ordered for midday today and again for the morning.  *    Ordered extra strength Tylenol a.m. and at bedtime per her normal home routine.  She requested this because her arthritis pain is beginning to creep up on her since she has not had her usual a.m. Tylenol.  *    ?hold Linzess or continue this, will discuss with GI MD?   Azucena Freed  11/28/2019, 10:06 AM Phone [phone]    Attending physician's note   I have taken a history, examined the patient and reviewed the chart. I agree with the Advanced Practitioner's note, impression and recommendations.  86 year old female with CKD stage V, recently diagnosed with proximal A. fib last year on chronic anticoagulation with Eliquis admitted with painless hematochezia likely secondary to diverticular hemorrhage.  No further bleeding since admission  Continue to monitor hemoglobin and transfuse as needed to> 7 Clear liquids Eliquis on hold, please discuss with cardiology if patient needs it long-term given she is at risk for recurrent bleed  Hold Linzess for now but if no further bleeding or bowel movement, can resume it tomorrow. Will consider advancing diet tomorrow if no more bleeding  GI will continue to follow along   The patient was provided an opportunity to ask questions and all were answered. The patient agreed with the plan and demonstrated an understanding of the instructions.  Damaris Hippo , MD [phone]

## 2019-11-28 NOTE — ED Provider Notes (Signed)
[facility] EMERGENCY DEPARTMENT Provider Note   CSN: [id number] Arrival date & time: 11/28/19  0437     History Chief Complaint  Patient presents with  . poss gi bleed    Danielle Frazier is a 86 y.o. female.  Patient presents to the emergency department from home by ambulance after suffering a syncopal episode.  Patient reports that she has been having bloody diarrhea tonight.  She has had at least 4 episodes.  After the last episode she was leaving the bathroom when she felt like she was going to pass out.  She was able to gently get herself down to the ground and then briefly did lose consciousness.  EMS report hypotension upon arrival which responded to IV fluids.  Patient denies abdominal pain.  She is on Eliquis because of a history of atrial fibrillation.  Last dose was yesterday morning.  Patient reports that she did have a history of acute GI bleed several years ago that was diagnosed as diverticulosis.        Past Medical History:  Diagnosis Date  . Abdominal tenderness, RLQ (right lower quadrant) 04/24/2013  . Acute bacterial sinusitis 03/22/2013  . Acute bronchitis 03/24/2018   1/20  . Anemia associated with acute blood loss 06/04/2018  . Bilateral renal cysts 09/07/2011  . BRBPR (bright red blood per rectum) 06/04/2018  . Cerumen impaction 03/24/2018   1/20 L  . CKD (chronic kidney disease) stage 5, GFR less than 15 ml/min (HCC) 08/25/2008   Estimated Creatinine Clearance: 12.7 mL/min (A) (by C-G formula based on SCr of 3.58 mg/dL (H)).  . Constipation, chronic 04/24/2013  . Diabetes mellitus    type II  . 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 DM (diabetes mellitus), type 2 with renal complications ([place]) 06/10/5186  . ELECTROCARDIOGRAM, ABNORMAL 08/25/2008   Qualifier: Diagnosis of  By: Ronnald Ramp MD, Arvid Right.   . Essential hypertension 08/25/2008  . Gastroesophageal reflux disease without esophagitis 07/06/2015  . GI bleeding 06/07/2018  . Gout   . GOUT 08/25/2008        . Greater trochanteric bursitis of right hip 11/07/2017   Injected November 07, 2017 repeat injection Jul 13, 2018 repeat injection September 20, 2018  . Hip pain, chronic, right 07/23/2012   Dg Hip Complete Right  07/23/2012   *RADIOLOGY REPORT*  Clinical Data: Right hip pain  RIGHT HIP - COMPLETE 2+ VIEW  Comparison: None.  Findings: No fracture or dislocation is seen.  Visualized bony pelvis appears intact.  Bilateral hip joint spaces are symmetric.  Degenerative changes of the lower lumbar spine.  IMPRESSION: No acute osseous abnormality is seen.   Original Report Authenticated By:   . Hyperlipidemia   . Hyperlipidemia with target LDL less than 100 12/30/2009  . Hypertension   . Insomnia secondary to chronic pain 07/11/2018  . LBP (low back pain)    foraminal narrowing at L4-5 and disc bulge at L5- S1 on MRI 6-08  . Lumbar radiculopathy 12/11/2017  . Morbid obesity ([place]) 03/22/2013  . Otitis media 03/24/2018   1/20 R and possibly L  . Persistent atrial fibrillation ([place]) 06/07/2018  . Renal insufficiency   . Right hip pain 10/16/2017  . Shoulder injury 05/21/2012  . Urinary incontinence   . Uterine fibroid 05/16/2013  . UTI (lower urinary tract infection) 04/24/2013    Patient Active Problem List   Diagnosis Date Noted  . Deficiency anemia 06/11/2019  . Insomnia secondary to chronic pain 07/11/2018  .  Persistent atrial fibrillation ([place]) 06/07/2018  . Lumbar radiculopathy 12/11/2017  . Greater trochanteric bursitis of right hip 11/07/2017  . Gastroesophageal reflux disease without esophagitis 07/06/2015  . Routine general medical examination at a health care facility 07/06/2015  . Constipation, chronic 04/24/2013  . Morbid obesity ([place]) 03/22/2013  . 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 Bilateral renal cysts 09/07/2011  . Hyperlipidemia with target LDL less than 100 12/30/2009  . DM (diabetes mellitus), type 2 with renal complications ([place]) 46/28/6381  . GOUT 08/25/2008  . Essential hypertension  08/25/2008  . CKD (chronic kidney disease) stage 5, GFR less than 15 ml/min (HCC) 08/25/2008  . OAB (overactive bladder) 08/25/2008    Past Surgical History:  Procedure Laterality Date  . ESOPHAGOGASTRODUODENOSCOPY (EGD) WITH PROPOFOL N/A 06/08/2018   Procedure: ESOPHAGOGASTRODUODENOSCOPY (EGD) WITH PROPOFOL;  Surgeon: [name], MD;  Location: [facility] ENDOSCOPY;  Service: Gastroenterology;  Laterality: N/A;     OB History    Gravida  0   Para  0   Term  0   Preterm  0   AB  0   Living  0     SAB  0   TAB  0   Ectopic  0   Multiple  0   Live Births              Family History  Problem Relation Age of Onset  . Diabetes Other   . Hypertension Other     Social History   Tobacco Use  . Smoking status: Never Smoker  . Smokeless tobacco: Never Used  Vaping Use  . Vaping Use: Never used  Substance Use Topics  . Alcohol use: No  . Drug use: No    Home Medications Prior to Admission medications   Medication Sig Start Date End Date Taking? Authorizing Provider  acetaminophen (TYLENOL) 650 MG CR tablet Take 650 mg by mouth every 8 (eight) hours as needed for pain.    [provider]  allopurinol (ZYLOPRIM) 100 MG tablet Take 1 tablet (100 mg total) by mouth daily. 09/13/19   [name], MD  Blood Glucose Monitoring Suppl (BAYER CONTOUR MONITOR) W/DEVICE KIT 1 Act by Does not apply route 2 (two) times daily. 05/06/11   [name], MD  BYSTOLIC 10 MG tablet TAKE 1 TABLET BY MOUTH EVERY DAY 11/25/19   [name], MD  Cholecalciferol (VITAMIN D) 50 MCG (2000 UT) tablet Take 2,000 Units by mouth daily.    [provider]  ELIQUIS 2.5 MG TABS tablet TAKE 1 TABLET BY MOUTH TWICE A DAY 06/19/19   [name], MD  famotidine (PEPCID) 40 MG tablet Take 40 mg by mouth daily. 07/15/19   [provider]  furosemide (LASIX) 40 MG tablet Take 40 mg by mouth daily. 08/22/19   [provider]  glucose blood (BAYER CONTOUR TEST)  test strip Use BID 05/06/11   [name], MD  LINZESS 145 MCG CAPS capsule TAKE 1 CAPSULE BY MOUTH EVERY DAY 03/13/19   [name], MD  Multiple Vitamins-Minerals (CENTRUM SILVER PO) Take 1 tablet by mouth daily.      [provider]  oxyCODONE (OXY IR/ROXICODONE) 5 MG immediate release tablet Take 1 tablet (5 mg total) by mouth every 6 (six) hours as needed for severe pain. 06/11/19   [name], MD  apixaban (ELIQUIS) 2.5 MG TABS tablet Take 1 tablet (2.5 mg total) by mouth 2 (two) times daily. 12/12/18   Ronnald Ramp,  Arvid Right, MD  nebivolol (BYSTOLIC) 10 MG tablet Take 10 mg by mouth 2 (two) times daily.    [provider]    Allergies    Metformin and Crestor [rosuvastatin]  Review of Systems   Review of Systems  Gastrointestinal: Positive for blood in stool.  Neurological: Positive for syncope.  All other systems reviewed and are negative.   Physical Exam Updated Vital Signs BP (!) 166/75   Pulse 80   Resp 10   Ht '5\' 6"'  (1.676 m)   Wt 68 kg   SpO2 100%   BMI 24.21 kg/m   Physical Exam Vitals and nursing note reviewed.  Constitutional:      General: She is not in acute distress.    Appearance: Normal appearance. She is well-developed.  HENT:     Head: Normocephalic and atraumatic.     Right Ear: Hearing normal.     Left Ear: Hearing normal.     Nose: Nose normal.  Eyes:     Conjunctiva/sclera: Conjunctivae normal.     Pupils: Pupils are equal, round, and reactive to light.  Cardiovascular:     Rate and Rhythm: Regular rhythm.     Heart sounds: S1 normal and S2 normal. No murmur heard.  No friction rub. No gallop.   Pulmonary:     Effort: Pulmonary effort is normal. No respiratory distress.     Breath sounds: Normal breath sounds.  Chest:     Chest wall: No tenderness.  Abdominal:     General: Bowel sounds are normal.     Palpations: Abdomen is soft.     Tenderness: There is no abdominal tenderness. There is no guarding or rebound.  Negative signs include Murphy's sign and McBurney's sign.     Hernia: No hernia is present.  Genitourinary:    Rectum: Guaiac result positive (gross dark red blood).  Musculoskeletal:        General: Normal range of motion.     Cervical back: Normal range of motion and neck supple.  Skin:    General: Skin is warm and dry.     Findings: No rash.  Neurological:     Mental Status: She is alert and oriented to person, place, and time.     GCS: GCS eye subscore is 4. GCS verbal subscore is 5. GCS motor subscore is 6.     Cranial Nerves: No cranial nerve deficit.     Sensory: No sensory deficit.     Coordination: Coordination normal.  Psychiatric:        Speech: Speech normal.        Behavior: Behavior normal.        Thought Content: Thought content normal.     ED Results / Procedures / Treatments   Labs (all labs ordered are listed, but only abnormal results are displayed) Labs Reviewed  CBC WITH DIFFERENTIAL/PLATELET - Abnormal; Notable for the following components:      Result Value   RBC 3.07 (*)    Hemoglobin 10.2 (*)    HCT 33.3 (*)    MCV 108.5 (*)    All other components within normal limits  COMPREHENSIVE METABOLIC PANEL - Abnormal; Notable for the following components:   Potassium 3.3 (*)    Glucose, Bld 156 (*)    BUN 68 (*)    Creatinine, Ser 4.46 (*)    Total Protein 6.1 (*)    Albumin 3.2 (*)    GFR calc non Af Amer 8 (*)  GFR calc Af Amer 10 (*)    All other components within normal limits  POC OCCULT BLOOD, ED - Abnormal; Notable for the following components:   Fecal Occult Bld POSITIVE (*)    All other components within normal limits  SARS CORONAVIRUS 2 BY RT PCR [facility] ORDER, [place] LAB)  PROTIME-INR  TYPE AND SCREEN    EKG EKG Interpretation  Date/Time:  Thursday November 28 2019 04:38:43 EDT Ventricular Rate:  67 PR Interval:    QRS Duration: 108 QT Interval:  572 QTC Calculation: 604 R Axis:   75 Text  Interpretation: Atrial fibrillation Borderline repolarization abnormality Prolonged QT interval Baseline wander in lead(s) II III aVF Confirmed by Orpah Greek [id number]) on 11/28/2019 6:00:41 AM   Radiology No results found.  Procedures Procedures (including critical care time)  Medications Ordered in ED Medications - No data to display  ED Course  I have reviewed the triage vital signs and the nursing notes.  Pertinent labs & imaging results that were available during my care of the patient were reviewed by me and considered in my medical decision making (see chart for details).    MDM Rules/Calculators/A&P                          Patient presents to the emergency department for evaluation of rectal bleeding.  Patient is on chronic Eliquis secondary to atrial fibrillation.  She has had multiple episodes of bleeding this evening and then had a syncopal episode upon standing.  EMS report low blood pressure initially that responded to IV fluids.  Patient's hemoglobin is 10, which is reassuring.  She does have potential for continued bleeding secondary to the Eliquis use.  Will recommend hospitalization for further monitoring of hemoglobin.  Patient did have an episode of melena in 2020.  During that hospital stay she had an EGD that was normal.  She did not have colonoscopy because a CAT scan at that time showed diverticulitis.  Patient is not experiencing abdominal pain and has no tenderness on exam.  Final Clinical Impression(s) / ED Diagnoses Final diagnoses:  Gastrointestinal hemorrhage, unspecified gastrointestinal hemorrhage type    Rx / DC Orders ED Discharge Orders    None       Orpah Greek, MD 11/28/19 [phone]

## 2019-11-29 DIAGNOSIS — K625 Hemorrhage of anus and rectum: Secondary | ICD-10-CM | POA: Diagnosis not present

## 2019-11-29 DIAGNOSIS — Z79899 Other long term (current) drug therapy: Secondary | ICD-10-CM | POA: Diagnosis not present

## 2019-11-29 DIAGNOSIS — E1122 Type 2 diabetes mellitus with diabetic chronic kidney disease: Secondary | ICD-10-CM | POA: Diagnosis present

## 2019-11-29 DIAGNOSIS — I4819 Other persistent atrial fibrillation: Secondary | ICD-10-CM | POA: Diagnosis present

## 2019-11-29 DIAGNOSIS — K219 Gastro-esophageal reflux disease without esophagitis: Secondary | ICD-10-CM | POA: Diagnosis present

## 2019-11-29 DIAGNOSIS — Z7901 Long term (current) use of anticoagulants: Secondary | ICD-10-CM | POA: Diagnosis not present

## 2019-11-29 DIAGNOSIS — N185 Chronic kidney disease, stage 5: Secondary | ICD-10-CM | POA: Diagnosis not present

## 2019-11-29 DIAGNOSIS — Z20822 Contact with and (suspected) exposure to covid-19: Secondary | ICD-10-CM | POA: Diagnosis present

## 2019-11-29 DIAGNOSIS — I959 Hypotension, unspecified: Secondary | ICD-10-CM | POA: Diagnosis present

## 2019-11-29 DIAGNOSIS — G4701 Insomnia due to medical condition: Secondary | ICD-10-CM | POA: Diagnosis present

## 2019-11-29 DIAGNOSIS — M109 Gout, unspecified: Secondary | ICD-10-CM | POA: Diagnosis present

## 2019-11-29 DIAGNOSIS — K5793 Diverticulitis of intestine, part unspecified, without perforation or abscess with bleeding: Secondary | ICD-10-CM

## 2019-11-29 DIAGNOSIS — I10 Essential (primary) hypertension: Secondary | ICD-10-CM | POA: Diagnosis not present

## 2019-11-29 DIAGNOSIS — D62 Acute posthemorrhagic anemia: Secondary | ICD-10-CM | POA: Diagnosis present

## 2019-11-29 DIAGNOSIS — K648 Other hemorrhoids: Secondary | ICD-10-CM | POA: Diagnosis not present

## 2019-11-29 DIAGNOSIS — Z8249 Family history of ischemic heart disease and other diseases of the circulatory system: Secondary | ICD-10-CM | POA: Diagnosis not present

## 2019-11-29 DIAGNOSIS — E1121 Type 2 diabetes mellitus with diabetic nephropathy: Secondary | ICD-10-CM | POA: Diagnosis not present

## 2019-11-29 DIAGNOSIS — E785 Hyperlipidemia, unspecified: Secondary | ICD-10-CM | POA: Diagnosis present

## 2019-11-29 DIAGNOSIS — D631 Anemia in chronic kidney disease: Secondary | ICD-10-CM | POA: Diagnosis present

## 2019-11-29 DIAGNOSIS — K922 Gastrointestinal hemorrhage, unspecified: Secondary | ICD-10-CM

## 2019-11-29 DIAGNOSIS — E876 Hypokalemia: Secondary | ICD-10-CM | POA: Diagnosis present

## 2019-11-29 DIAGNOSIS — I120 Hypertensive chronic kidney disease with stage 5 chronic kidney disease or end stage renal disease: Secondary | ICD-10-CM | POA: Diagnosis not present

## 2019-11-29 DIAGNOSIS — Z888 Allergy status to other drugs, medicaments and biological substances status: Secondary | ICD-10-CM | POA: Diagnosis not present

## 2019-11-29 DIAGNOSIS — K559 Vascular disorder of intestine, unspecified: Secondary | ICD-10-CM | POA: Diagnosis not present

## 2019-11-29 DIAGNOSIS — Z833 Family history of diabetes mellitus: Secondary | ICD-10-CM | POA: Diagnosis not present

## 2019-11-29 DIAGNOSIS — Z66 Do not resuscitate: Secondary | ICD-10-CM | POA: Diagnosis not present

## 2019-11-29 DIAGNOSIS — K5730 Diverticulosis of large intestine without perforation or abscess without bleeding: Secondary | ICD-10-CM | POA: Diagnosis not present

## 2019-11-29 DIAGNOSIS — G8929 Other chronic pain: Secondary | ICD-10-CM | POA: Diagnosis present

## 2019-11-29 DIAGNOSIS — K921 Melena: Secondary | ICD-10-CM | POA: Diagnosis not present

## 2019-11-29 DIAGNOSIS — R55 Syncope and collapse: Secondary | ICD-10-CM | POA: Diagnosis present

## 2019-11-29 DIAGNOSIS — K6389 Other specified diseases of intestine: Secondary | ICD-10-CM | POA: Diagnosis not present

## 2019-11-29 DIAGNOSIS — K5731 Diverticulosis of large intestine without perforation or abscess with bleeding: Secondary | ICD-10-CM | POA: Diagnosis not present

## 2019-11-29 LAB — CBC
HCT: 20.3 % — ABNORMAL LOW (ref 36.0–46.0)
Hemoglobin: 6.5 g/dL — CL (ref 12.0–15.0)
MCH: 33.9 pg (ref 26.0–34.0)
MCHC: 32 g/dL (ref 30.0–36.0)
MCV: 105.7 fL — ABNORMAL HIGH (ref 80.0–100.0)
Platelets: 160 10*3/uL (ref 150–400)
RBC: 1.92 MIL/uL — ABNORMAL LOW (ref 3.87–5.11)
RDW: 13.6 % (ref 11.5–15.5)
WBC: 6.5 10*3/uL (ref 4.0–10.5)
nRBC: 0 % (ref 0.0–0.2)

## 2019-11-29 LAB — PREPARE RBC (CROSSMATCH)

## 2019-11-29 LAB — HEMOGLOBIN AND HEMATOCRIT, BLOOD
HCT: 28.7 % — ABNORMAL LOW (ref 36.0–46.0)
HCT: 28.1 % — ABNORMAL LOW (ref 36.0–46.0)
Hemoglobin: 9 g/dL — ABNORMAL LOW (ref 12.0–15.0)
Hemoglobin: 9.4 g/dL — ABNORMAL LOW (ref 12.0–15.0)

## 2019-11-29 LAB — BASIC METABOLIC PANEL
Anion gap: 9 (ref 5–15)
BUN: 62 mg/dL — ABNORMAL HIGH (ref 8–23)
CO2: 21 mmol/L — ABNORMAL LOW (ref 22–32)
Calcium: 9.5 mg/dL (ref 8.9–10.3)
Chloride: 109 mmol/L (ref 98–111)
Creatinine, Ser: 3.99 mg/dL — ABNORMAL HIGH (ref 0.44–1.00)
GFR calc Af Amer: 11 mL/min — ABNORMAL LOW (ref >60)
GFR calc non Af Amer: 10 mL/min — ABNORMAL LOW (ref >60)
Glucose, Bld: 140 mg/dL — ABNORMAL HIGH (ref 70–99)
Potassium: 3.6 mmol/L (ref 3.5–5.1)
Sodium: 139 mmol/L (ref 135–145)

## 2019-11-29 LAB — IRON AND TIBC
Iron: 208 ug/dL — ABNORMAL HIGH (ref 28–170)
Saturation Ratios: 86 % — ABNORMAL HIGH (ref 10.4–31.8)
TIBC: 241 ug/dL — ABNORMAL LOW (ref 250–450)
UIBC: 33 ug/dL

## 2019-11-29 LAB — FERRITIN: Ferritin: 79 ng/mL (ref 11–307)

## 2019-11-29 LAB — RETICULOCYTES
Immature Retic Fract: 9.9 % (ref 2.3–15.9)
RBC.: 2.86 MIL/uL — ABNORMAL LOW (ref 3.87–5.11)
Retic Count, Absolute: 53.5 10*3/uL (ref 19.0–186.0)
Retic Ct Pct: 1.9 % (ref 0.4–3.1)

## 2019-11-29 LAB — FOLATE: Folate: 44 ng/mL (ref >5.9)

## 2019-11-29 LAB — VITAMIN B12: Vitamin B-12: 534 pg/mL (ref 180–914)

## 2019-11-29 MED ORDER — BISACODYL 5 MG PO TBEC
20.0000 mg | DELAYED_RELEASE_TABLET | Freq: Once | ORAL | Status: AC
  Administered 2019-11-29: 20 mg via ORAL
  Filled 2019-11-29: qty 4

## 2019-11-29 MED ORDER — PANTOPRAZOLE SODIUM 40 MG IV SOLR
40.0000 mg | Freq: Two times a day (BID) | INTRAVENOUS | Status: DC
  Administered 2019-11-29 (×2): 40 mg via INTRAVENOUS
  Filled 2019-11-29 (×2): qty 40

## 2019-11-29 MED ORDER — METOCLOPRAMIDE HCL 5 MG/ML IJ SOLN
10.0000 mg | Freq: Once | INTRAMUSCULAR | Status: AC
  Administered 2019-11-29: 10 mg via INTRAVENOUS
  Filled 2019-11-29: qty 2

## 2019-11-29 MED ORDER — PEG-KCL-NACL-NASULF-NA ASC-C 100 G PO SOLR
1.0000 | Freq: Once | ORAL | Status: AC
  Administered 2019-11-29: 200 g via ORAL
  Filled 2019-11-29: qty 1

## 2019-11-29 MED ORDER — SODIUM CHLORIDE 0.9% IV SOLUTION: Freq: Once | INTRAVENOUS | Status: DC

## 2019-11-29 NOTE — Progress Notes (Signed)
As per MD orders pt transferred to progressive unit 4N pt transferred with belongings unit nurse notified.

## 2019-11-29 NOTE — Progress Notes (Addendum)
[facility] ROUNDING NOTE   Subjective: Multiple episodes of hematochezia overnight.  Hemoglobin dropped to 6.5, receiving PRBC 2 units this morning Denies any nausea, vomiting, abdominal pain, melena or bright red blood per rectum    Objective: Vital signs in last 24 hours: Temp:  [97.5 F (36.4 C)-98.6 F (37 C)] 97.8 F (36.6 C) (09/17 1105) Pulse Rate:  [60-90] 60 (09/17 1105) Resp:  [16-20] 16 (09/17 1105) BP: (106-182)/(46-68) 136/61 (09/17 1105) SpO2:  [99 %-100 %] 100 % (09/17 1105) Last BM Date: 11/29/19 General: NAD Abdomen: Soft no tenderness or distention     Intake/Output from previous day: 09/16 0701 - 09/17 0700 In: 740 [P.O.:240; I.V.:500] Out: -  Intake/Output this shift: Total I/O In: 620 [Blood:620] Out: -    Lab Results: Recent Labs    11/28/19 0530 11/28/19 1200 11/29/19 0322  WBC 7.0 7.4 6.5  HGB 10.2* 8.7* 6.5*  PLT 217 195 160  MCV 108.5* 105.2* 105.7*   BMET Recent Labs    11/28/19 0530 11/29/19 0322  NA 141 139  K 3.3* 3.6  CL 108 109  CO2 22 21*  GLUCOSE 156* 140*  BUN 68* 62*  CREATININE 4.46* 3.99*  CALCIUM 10.3 9.5   LFT Recent Labs    11/28/19 0530  PROT 6.1*  ALBUMIN 3.2*  AST 23  ALT 15  ALKPHOS 125  BILITOT 0.3   PT/INR Recent Labs    11/28/19 0530  INR 1.2      Imaging/Other results: No results found.    Assessment &Plan  86 year old very pleasant female with history of stage V CKD, proximal A. fib on Eliquis with hematochezia  Likely etiology diverticular hemorrhage  She had similar episode in March, 2020 underwent EGD that was unremarkable.  Last colonoscopy in 2004 with diverticulosis  Last dose of Eliquis 11/27/2019  Given she is continue to have persistent GI hemorrhage, will proceed with colonoscopy for evaluation and therapeutic intervention if needed  Start bowel prep  Clear liquid diet  N.p.o. after 5 AM tomorrow  Monitor hemoglobin and transfuse as  needed  The risks and benefits as well as alternatives of endoscopic procedure(s) have been discussed and reviewed. All questions answered. The patient agrees to proceed.    This required 35 minutes of patient care (this includes precharting, chart review, review of results, face-to-face time used for counseling as well as treatment plan and follow-up. The patient was provided an opportunity to ask questions and all were answered. The patient agreed with the plan and demonstrated an understanding of the instructions.   Damaris Hippo , MD [phone]  [facility]

## 2019-11-29 NOTE — Progress Notes (Addendum)
HOSPITAL MEDICINE OVERNIGHT EVENT NOTE    Notified by nursing that patient has had 4 episodes of moderate amounts of maroon colored bloody stools over the course of the night shift.  Patient is now complaining of intermittent generalized weakness with light headedness upon rising out of bed.    Review of notes reveals that GI is not pursuing endoscoping evaluation.  Renal function prevents IR intervention.   Systolic BP had dropped from 170's-180's to 106/46 BP on last reading with MAPs in low 60's.  Obtaining stat repeat CBC, will initiate blood transfusion if Hgb is approaching or is less than 7.  I have already verbally obtained consent to initiate transfusion.   Will transfer to progressive unit for close blood pressure monitoring, monitoring for recurrent bleeding and likelihood of a blood transfusion.   Vernelle Emerald  MD [facility]    ADDENDUM (3:45AM)  Notified by nursing Hgb is 6.5.  Will transfuse 2 units PRBC's slowly due to Hx of CHF.    Sherryll Burger Traci Gafford

## 2019-11-29 NOTE — Progress Notes (Signed)
First unit PRBC started and infusing with no abnormal reaction

## 2019-11-29 NOTE — Progress Notes (Addendum)
PROGRESS NOTE    Danielle Frazier  VOH:[id number] DOB: August 22, 1930 DOA: 11/28/2019 PCP: [name], MD    Brief Narrative:  Danielle Frazier is a 86 year old female with past medical history notable for CKD stage V, essential hypertension, hyperlipidemia, atrial fibrillation on Eliquis, and type 2 diabetes mellitus who presented to the ED with rectal bleeding.  Night prior, patient woke up from sleep and noted that she had frank blood within her bowel movement.  This occurred every hour until EMS arrival and she ultimately syncopized walking into the bathroom and fell on the floor.  Patient with previous GI bleed in March 2020 with unremarkable EGD and bleeding thought to be diverticular.  In the ED, patient was noted to have a large volume dark red blood.  She was on Eliquis with last dose a.m. 9/15.  Hemoglobin on arrival was noted be 10.2 with MCV of 108.5.  GI was consulted, [facility] consulted for admission.  9/16: Overnight, 4 episodes of maroon-colored stools, hemoglobin dropped from 10.2 to 8.7 to 6.5 overnight.  Patient was transferred to the progressive unit for higher level of care.  2 units PRBC were ordered.  Assessment & Plan:   Principal Problem:   GI bleed Active Problems:   DM (diabetes mellitus), type 2 with renal complications (HCC)   Essential hypertension   CKD (chronic kidney disease) stage 5, GFR less than 15 ml/min (HCC)   Persistent atrial fibrillation (HCC)   Anemia due to stage 5 chronic kidney disease (HCC)   Prolonged QT interval   DNR (do not resuscitate)   Acute blood loss anemia GI bleed, suspect diverticular Patient presenting from home with persistent rectal bleeding leading to syncopal episode.  Complicated by history of A. fib on Eliquis, last dose a.m. of 11/27/2019.  History of GI bleed March 2020, EGD unrevealing and thought to be diverticular at that time. --[facility] following, appreciate assistance --Holding home Eliquis --Hgb  10.2>8.7>6.5 --Currently transfusing 2u PRBC --Repeat hemoglobin 2 hours following transfusion; will continue to monitor hemoglobin every 6 hours thereafter --Continue to monitor on telemetry --If brisk bleeding returns, will consider nuclear medicine tagged RBC scan, unable to perform CTA secondary to renal insufficiency --Protonix 40mg  IV BID; given dark stools concerning for possible more proximal source given diverticular bleeds usually bright in color --Transfuse for hemoglobin less than 7.0 or brisk/active bleeding --Await further GI recommendations  Chronic anemia of renal disease Anemia worsened with active GI bleed as above.  Elevated MCV.  Transfusing as above. --Check anemia panel for possible underlying vitamin deficiency  Gout: Continue allopurinol 100 mg p.o. daily  Prolonged QTC QTC 604 on admission, likely exacerbated by GI bleed/acute blood loss anemia as above. --Avoid QT prolonging medications --Monitor on telemetry  Paroxysmal atrial fibrillation Patient is followed by cardiology, Dr. Kristopher Oppenheim outpatient.  On Eliquis. --Hold Eliquis for GI bleed as above, may need to consider discontinuation on discharge given recurrent GI bleed --Continue Bystolic 10 mg p.o. daily --Monitor on telemetry  CKD stage V Patient continues to make urine and not planning to pursue HD.  Follows with nephrology, Dr. Moshe Cipro outpatient.   --Cr 4.46> 3.99 --Avoid nephrotoxins, renally dose all medications --Follow BMP daily  Essential hypertension BP 140/59 this morning --Continue Bystolic 10 mg p.o. daily --Continue monitor BP closely in the setting of GI bleed  Type 2 diabetes mellitus Hemoglobin A1c 5.5 on 06/11/2019.  Diet controlled.   DVT prophylaxis: SCDs, chemical DVT prophylaxis contraindicated with active bleeding Code Status:  DNR Family Communication: Updated patient extensively at bedside, updated patients husband [name] via telephone this afternoon  Disposition  Plan:  Status is: Observation  The patient remains OBS appropriate and will d/c before 2 midnights.  Dispo: The patient is from: Home              Anticipated d/c is to: Home              Anticipated d/c date is: 2 days              Patient currently is not medically stable to d/c.   Consultants:   [facility]  Procedures:   None  Antimicrobials:   None   Subjective: Patient seen and examined bedside, resting comfortably.  Overnight with several dark maroon stools with drop in hemoglobin to 6.5.  Subsequently was moved to the progressive care unit and currently being transfused 2 unit PRBC.  Initially with dizziness and fatigue, slightly improved this morning.  No further bleeding episodes since last night.  No other complaints or concerns at this time.  Denies headache, no current dizziness, no visual changes, no chest pain, palpitations, no shortness of breath, no abdominal pain.  No other acute events overnight per nursing staff.  Objective: Vitals:   11/29/19 0751 11/29/19 0820 11/29/19 0854 11/29/19 1105  BP: (!) 122/51 (!) 128/56 (!) 128/50 136/61  Pulse: 72  71 60  Resp: 16  20 16   Temp: 98 F (36.7 C) 97.7 F (36.5 C) 98 F (36.7 C) 97.8 F (36.6 C)  TempSrc: Oral Oral Oral Oral  SpO2: 100%   100%  Weight:      Height:        Intake/Output Summary (Last 24 hours) at 11/29/2019 1311 Last data filed at 11/29/2019 1300 Gross per 24 hour  Intake 1360 ml  Output --  Net 1360 ml   Filed Weights   11/28/19 0502  Weight: 68 kg    Examination:  General exam: Appears calm and comfortable, slight pallor to mucous membranes Respiratory system: Clear to auscultation. Respiratory effort normal.  Oxygenating well on room air Cardiovascular system: S1 & S2 heard, RRR. No JVD, murmurs, rubs, gallops or clicks. No pedal edema. Gastrointestinal system: Abdomen is nondistended, soft and nontender. No organomegaly or masses felt. Normal bowel sounds heard. Central  nervous system: Alert and oriented. No focal neurological deficits. Extremities: Symmetric 5 x 5 power. Skin: No rashes, lesions or ulcers Psychiatry: Judgement and insight appear normal. Mood & affect appropriate.     Data Reviewed: I have personally reviewed following labs and imaging studies  CBC: Recent Labs  Lab 11/28/19 0530 11/28/19 1200 11/29/19 0322  WBC 7.0 7.4 6.5  NEUTROABS 5.0  --   --   HGB 10.2* 8.7* 6.5*  HCT 33.3* 28.3* 20.3*  MCV 108.5* 105.2* 105.7*  PLT 217 195 962   Basic Metabolic Panel: Recent Labs  Lab 11/28/19 0530 11/29/19 0322  NA 141 139  K 3.3* 3.6  CL 108 109  CO2 22 21*  GLUCOSE 156* 140*  BUN 68* 62*  CREATININE 4.46* 3.99*  CALCIUM 10.3 9.5   GFR: Estimated Creatinine Clearance: 9.5 mL/min (A) (by C-G formula based on SCr of 3.99 mg/dL (H)). Liver Function Tests: Recent Labs  Lab 11/28/19 0530  AST 23  ALT 15  ALKPHOS 125  BILITOT 0.3  PROT 6.1*  ALBUMIN 3.2*   No results for input(s): LIPASE, AMYLASE in the last 168 hours. No results for input(s): AMMONIA  in the last 168 hours. Coagulation Profile: Recent Labs  Lab 11/28/19 0530  INR 1.2   Cardiac Enzymes: No results for input(s): CKTOTAL, CKMB, CKMBINDEX, TROPONINI in the last 168 hours. BNP (last 3 results) No results for input(s): PROBNP in the last 8760 hours. HbA1C: No results for input(s): HGBA1C in the last 72 hours. CBG: No results for input(s): GLUCAP in the last 168 hours. Lipid Profile: No results for input(s): CHOL, HDL, LDLCALC, TRIG, CHOLHDL, LDLDIRECT in the last 72 hours. Thyroid Function Tests: No results for input(s): TSH, T4TOTAL, FREET4, T3FREE, THYROIDAB in the last 72 hours. Anemia Panel: No results for input(s): VITAMINB12, FOLATE, FERRITIN, TIBC, IRON, RETICCTPCT in the last 72 hours. Sepsis Labs: No results for input(s): PROCALCITON, LATICACIDVEN in the last 168 hours.  Recent Results (from the past 240 hour(s))  SARS Coronavirus 2 by  RT PCR (hospital order, performed in [facility] hospital lab) Nasopharyngeal Nasopharyngeal Swab     Status: None   Collection Time: 11/28/19  7:00 AM   Specimen: Nasopharyngeal Swab  Result Value Ref Range Status   SARS Coronavirus 2 NEGATIVE NEGATIVE Final    Comment: (NOTE) SARS-CoV-2 target nucleic acids are NOT DETECTED.  The SARS-CoV-2 RNA is generally detectable in upper and lower respiratory specimens during the acute phase of infection. The lowest concentration of SARS-CoV-2 viral copies this assay can detect is 250 copies / mL. A negative result does not preclude SARS-CoV-2 infection and should not be used as the sole basis for treatment or other patient management decisions.  A negative result may occur with improper specimen collection / handling, submission of specimen other than nasopharyngeal swab, presence of viral mutation(s) within the areas targeted by this assay, and inadequate number of viral copies (<250 copies / mL). A negative result must be combined with clinical observations, patient history, and epidemiological information.  Fact Sheet for Patients:   StrictlyIdeas.no  Fact Sheet for Healthcare Providers: BankingDealers.co.za  This test is not yet approved or  cleared by the [place] FDA and has been authorized for detection and/or diagnosis of SARS-CoV-2 by FDA under an Emergency Use Authorization (EUA).  This EUA will remain in effect (meaning this test can be used) for the duration of the COVID-19 declaration under Section 564(b)(1) of the Act, 21 U.S.C. section 360bbb-3(b)(1), unless the authorization is terminated or revoked sooner.  Performed at [facility], [place] [address], [place], [place] [address]          Radiology Studies: No results found.      Scheduled Meds: . sodium chloride   Intravenous Once  . acetaminophen  1,000 mg Oral BID  . allopurinol  100 mg Oral Daily   . nebivolol  10 mg Oral Daily  . pantoprazole (PROTONIX) IV  40 mg Intravenous Q12H   Continuous Infusions: . lactated ringers 100 mL/hr at 11/28/19 0835     LOS: 0 days    Time spent: 39 minutes spent on chart review, discussion with nursing staff, consultants, updating family and interview/physical exam; more than 50% of that time was spent in counseling and/or coordination of care.    Judea Riches J [place] DO [facility] Available via Epic secure chat 7am-7pm After these hours, please refer to coverage provider listed on [email or web address] 11/29/2019, 1:11 PM

## 2019-11-29 NOTE — H&P (View-Only) (Signed)
[facility] ROUNDING NOTE   Subjective: Multiple episodes of hematochezia overnight.  Hemoglobin dropped to 6.5, receiving PRBC 2 units this morning Denies any nausea, vomiting, abdominal pain, melena or bright red blood per rectum    Objective: Vital signs in last 24 hours: Temp:  [97.5 F (36.4 C)-98.6 F (37 C)] 97.8 F (36.6 C) (09/17 1105) Pulse Rate:  [60-90] 60 (09/17 1105) Resp:  [16-20] 16 (09/17 1105) BP: (106-182)/(46-68) 136/61 (09/17 1105) SpO2:  [99 %-100 %] 100 % (09/17 1105) Last BM Date: 11/29/19 General: NAD Abdomen: Soft no tenderness or distention     Intake/Output from previous day: 09/16 0701 - 09/17 0700 In: 740 [P.O.:240; I.V.:500] Out: -  Intake/Output this shift: Total I/O In: 620 [Blood:620] Out: -    Lab Results: Recent Labs    11/28/19 0530 11/28/19 1200 11/29/19 0322  WBC 7.0 7.4 6.5  HGB 10.2* 8.7* 6.5*  PLT 217 195 160  MCV 108.5* 105.2* 105.7*   BMET Recent Labs    11/28/19 0530 11/29/19 0322  NA 141 139  K 3.3* 3.6  CL 108 109  CO2 22 21*  GLUCOSE 156* 140*  BUN 68* 62*  CREATININE 4.46* 3.99*  CALCIUM 10.3 9.5   LFT Recent Labs    11/28/19 0530  PROT 6.1*  ALBUMIN 3.2*  AST 23  ALT 15  ALKPHOS 125  BILITOT 0.3   PT/INR Recent Labs    11/28/19 0530  INR 1.2      Imaging/Other results: No results found.    Assessment &Plan  86 year old very pleasant female with history of stage V CKD, proximal A. fib on Eliquis with hematochezia  Likely etiology diverticular hemorrhage  She had similar episode in March, 2020 underwent EGD that was unremarkable.  Last colonoscopy in 2004 with diverticulosis  Last dose of Eliquis 11/27/2019  Given she is continue to have persistent GI hemorrhage, will proceed with colonoscopy for evaluation and therapeutic intervention if needed  Start bowel prep  Clear liquid diet  N.p.o. after 5 AM tomorrow  Monitor hemoglobin and transfuse as  needed  The risks and benefits as well as alternatives of endoscopic procedure(s) have been discussed and reviewed. All questions answered. The patient agrees to proceed.    This required 35 minutes of patient care (this includes precharting, chart review, review of results, face-to-face time used for counseling as well as treatment plan and follow-up. The patient was provided an opportunity to ask questions and all were answered. The patient agreed with the plan and demonstrated an understanding of the instructions.   Damaris Hippo , MD [phone]  [facility]

## 2019-11-30 ENCOUNTER — Inpatient Hospital Stay (HOSPITAL_COMMUNITY): Payer: Medicare Other | Admitting: Certified Registered Nurse Anesthetist

## 2019-11-30 ENCOUNTER — Encounter (HOSPITAL_COMMUNITY): Payer: Self-pay | Admitting: Internal Medicine

## 2019-11-30 ENCOUNTER — Encounter (HOSPITAL_COMMUNITY)
Admission: EM | Disposition: A | Discharge status: Home / Self Care | Payer: Self-pay | Source: Home / Self Care | Attending: Internal Medicine

## 2019-11-30 DIAGNOSIS — Z66 Do not resuscitate: Secondary | ICD-10-CM

## 2019-11-30 HISTORY — PX: COLONOSCOPY WITH PROPOFOL: SHX5780

## 2019-11-30 LAB — BASIC METABOLIC PANEL
Anion gap: 13 (ref 5–15)
BUN: 62 mg/dL — ABNORMAL HIGH (ref 8–23)
CO2: 20 mmol/L — ABNORMAL LOW (ref 22–32)
Calcium: 9.6 mg/dL (ref 8.9–10.3)
Chloride: 110 mmol/L (ref 98–111)
Creatinine, Ser: 4.13 mg/dL — ABNORMAL HIGH (ref 0.44–1.00)
GFR calc Af Amer: 11 mL/min — ABNORMAL LOW (ref >60)
GFR calc non Af Amer: 9 mL/min — ABNORMAL LOW (ref >60)
Glucose, Bld: 121 mg/dL — ABNORMAL HIGH (ref 70–99)
Potassium: 3.2 mmol/L — ABNORMAL LOW (ref 3.5–5.1)
Sodium: 143 mmol/L (ref 135–145)

## 2019-11-30 LAB — TYPE AND SCREEN
ABO/RH(D): B POS
Antibody Screen: NEGATIVE
Unit division: 0
Unit division: 0

## 2019-11-30 LAB — CBC
HCT: 26 % — ABNORMAL LOW (ref 36.0–46.0)
Hemoglobin: 8.4 g/dL — ABNORMAL LOW (ref 12.0–15.0)
MCH: 30.8 pg (ref 26.0–34.0)
MCHC: 32.3 g/dL (ref 30.0–36.0)
MCV: 95.2 fL (ref 80.0–100.0)
Platelets: 141 10*3/uL — ABNORMAL LOW (ref 150–400)
RBC: 2.73 MIL/uL — ABNORMAL LOW (ref 3.87–5.11)
RDW: 18.6 % — ABNORMAL HIGH (ref 11.5–15.5)
WBC: 7.2 10*3/uL (ref 4.0–10.5)
nRBC: 0 % (ref 0.0–0.2)

## 2019-11-30 LAB — BPAM RBC
Blood Product Expiration Date: 202110072359
Blood Product Expiration Date: 202110072359
ISSUE DATE / TIME: 202109170427
ISSUE DATE / TIME: 202109170828
Unit Type and Rh: 7300
Unit Type and Rh: 7300

## 2019-11-30 LAB — MAGNESIUM: Magnesium: 1.5 mg/dL — ABNORMAL LOW (ref 1.7–2.4)

## 2019-11-30 LAB — HEMOGLOBIN AND HEMATOCRIT, BLOOD
HCT: 24.6 % — ABNORMAL LOW (ref 36.0–46.0)
Hemoglobin: 7.9 g/dL — ABNORMAL LOW (ref 12.0–15.0)

## 2019-11-30 LAB — GLUCOSE, CAPILLARY: Glucose-Capillary: 89 mg/dL (ref 70–99)

## 2019-11-30 SURGERY — COLONOSCOPY WITH PROPOFOL
Anesthesia: Monitor Anesthesia Care

## 2019-11-30 MED ORDER — MAGNESIUM SULFATE 2 GM/50ML IV SOLN
2.0000 g | Freq: Once | INTRAVENOUS | Status: AC
  Administered 2019-11-30: 2 g via INTRAVENOUS
  Filled 2019-11-30: qty 50

## 2019-11-30 MED ORDER — POTASSIUM CHLORIDE CRYS ER 20 MEQ PO TBCR
40.0000 meq | EXTENDED_RELEASE_TABLET | Freq: Once | ORAL | Status: AC
  Administered 2019-11-30: 40 meq via ORAL
  Filled 2019-11-30: qty 2

## 2019-11-30 MED ORDER — PROPOFOL 10 MG/ML IV BOLUS
INTRAVENOUS | Status: DC | PRN
  Administered 2019-11-30 (×2): 20 mg via INTRAVENOUS

## 2019-11-30 MED ORDER — PROPOFOL 500 MG/50ML IV EMUL
INTRAVENOUS | Status: DC | PRN
  Administered 2019-11-30: 100 ug/kg/min via INTRAVENOUS

## 2019-11-30 MED ORDER — PANTOPRAZOLE SODIUM 40 MG PO TBEC
40.0000 mg | DELAYED_RELEASE_TABLET | Freq: Every day | ORAL | Status: DC
  Administered 2019-11-30 – 2019-12-01 (×2): 40 mg via ORAL
  Filled 2019-11-30 (×2): qty 1

## 2019-11-30 MED ORDER — SODIUM CHLORIDE 0.9 % IV SOLN
INTRAVENOUS | Status: AC | PRN
  Administered 2019-11-30: 500 mL via INTRAMUSCULAR

## 2019-11-30 SURGICAL SUPPLY — 22 items

## 2019-11-30 NOTE — Interval H&P Note (Signed)
History and Physical Interval Note:  11/30/2019 8:21 AM  Danielle Frazier  has presented today for surgery, with the diagnosis of Hematochezia, blood per rectum.  The various methods of treatment have been discussed with the patient and family. After consideration of risks, benefits and other options for treatment, the patient has consented to  Procedure(s): COLONOSCOPY WITH PROPOFOL (N/A) as a surgical intervention.  The patient's history has been reviewed, patient examined, no change in status, stable for surgery.  I have reviewed the patient's chart and labs.  Questions were answered to the patient's satisfaction.     Danielle Frazier

## 2019-11-30 NOTE — Op Note (Signed)
[facility] Patient Name: Danielle Frazier Procedure Date : 11/30/2019 MRN: [id number] Attending MD: [name] , MD Date of Birth: Jun 29, 1930 CSN: [id number] Age: 86 Admit Type: Inpatient Procedure:                Colonoscopy Indications:              Evaluation of unexplained GI bleeding presenting                            with Hematochezia Providers:                [name], MD, Glori Bickers, RN, Tyna Jaksch Technician, Clearnce Sorrel, CRNA Referring MD:              Medicines:                Monitored Anesthesia Care Complications:            No immediate complications. Estimated Blood Loss:     Estimated blood loss: none. Procedure:                Pre-Anesthesia Assessment:                           - Prior to the procedure, a History and Physical                            was performed, and patient medications and                            allergies were reviewed. The patient's tolerance of                            previous anesthesia was also reviewed. The risks                            and benefits of the procedure and the sedation                            options and risks were discussed with the patient.                            All questions were answered, and informed consent                            was obtained. Prior Anticoagulants: The patient                            last took Eliquis (apixaban) 3 days prior to the                            procedure. ASA Grade Assessment: III - A patient  with severe systemic disease. After reviewing the                            risks and benefits, the patient was deemed in                            satisfactory condition to undergo the procedure.                           After obtaining informed consent, the colonoscope                            was passed under direct vision. Throughout the                            procedure,  the patient's blood pressure, pulse, and                            oxygen saturations were monitored continuously. The                            [id number]-[id number] ([id number]) Olympus pediatric colonoscope                            was introduced through the anus and advanced to the                            the terminal ileum, with identification of the                            appendiceal orifice and IC valve. The colonoscopy                            was performed without difficulty. The colonoscopy                            was technically difficult and complex due to                            multiple diverticula in the colon, restricted                            mobility of the colon and a tortuous colon.                            Successful completion of the procedure was aided by                            withdrawing the scope and replacing the pediatric                            colonoscope with ultraslim scope. The patient  tolerated the procedure well. The quality of the                            bowel preparation was good. The terminal ileum,                            ileocecal valve, appendiceal orifice, and rectum                            were photographed. Scope In: 8:31:44 AM Scope Out: 2:75:17 AM Scope Withdrawal Time: 0 hours 7 minutes 4 seconds  Total Procedure Duration: 0 hours 24 minutes 2 seconds  Findings:      The perianal and digital rectal examinations were normal.      Multiple small and large-mouthed diverticula were found in the sigmoid       colon and descending colon.      A patchy area of mildly erythematous, edematous, friable mucosa was       found in the ascending colon suggestive of ischemic colitis possible       source of hematochezia.      A small amount of liquid brown stool was found in the entire colon,       without interference to visualization. No heme, blood clots or evidence       of active GI bleeding       Non-bleeding internal hemorrhoids were found during retroflexion. The       hemorrhoids were large. Impression:               - Severe diverticulosis in the sigmoid colon and in                            the descending colon.                           - Erythematous mucosa in the ascending colon.                           - Stool in the entire examined colon.                           - Non-bleeding internal hemorrhoids.                           - No specimens collected. Recommendation:           - Patient has a contact number available for                            emergencies. The signs and symptoms of potential                            delayed complications were discussed with the                            patient. Return to normal activities tomorrow.  Written discharge instructions were provided to the                            patient.                           - Resume previous diet.                           - Continue present medications.                           - Resume Eliquis (apixaban) at prior dose in 5                            days. Refer to managing physician for further                            adjustment of therapy. Will need to discuss long                            term anticoagulation benefit vs risk given h/o                            recurrent significant GI bleed                           - No repeat colonoscopy due to age.                           - Continue to monitor Hgb and transfuse as needed                           - Ok to discharge home tomorrow from GI standpoint                            if no further bleeding                           - Please call with any questions, GI follow up as                            needed. Procedure Code(s):        --- Professional ---                           [phone], Colonoscopy, flexible; diagnostic, including                            collection of specimen(s) by brushing or  washing,                            when performed (separate procedure) Diagnosis Code(s):        --- Professional ---  K64.8, Other hemorrhoids                           K63.89, Other specified diseases of intestine                           K92.1, Melena (includes Hematochezia)                           K57.30, Diverticulosis of large intestine without                            perforation or abscess without bleeding CPT copyright 2019 [employer] Association. All rights reserved. The codes documented in this report are preliminary and upon coder review may  be revised to meet current compliance requirements. [name], MD 11/30/2019 9:18:10 AM This report has been signed electronically. Number of Addenda: 0

## 2019-11-30 NOTE — Progress Notes (Signed)
PROGRESS NOTE    Danielle Frazier  TOI:[id number] DOB: 12-23-1930 DOA: 11/28/2019 PCP: [name], MD    Brief Narrative:  Danielle Frazier is a 86 year old female with past medical history notable for CKD stage V, essential hypertension, hyperlipidemia, atrial fibrillation on Eliquis, and type 2 diabetes mellitus who presented to the ED with rectal bleeding.  Night prior, patient woke up from sleep and noted that she had frank blood within her bowel movement.  This occurred every hour until EMS arrival and she ultimately syncopized walking into the bathroom and fell on the floor.  Patient with previous GI bleed in March 2020 with unremarkable EGD and bleeding thought to be diverticular.  In the ED, patient was noted to have a large volume dark red blood.  She was on Eliquis with last dose a.m. 9/15.  Hemoglobin on arrival was noted be 10.2 with MCV of 108.5.  GI was consulted, TR[facility] consulted for admission.  9/16: Overnight, 4 episodes of maroon-colored stools, hemoglobin dropped from 10.2 to 8.7 to 6.5 overnight.  Patient was transferred to the progressive unit for higher level of care.  2 units PRBC were ordered.  9/17: Patient's hemoglobin has stabilized, 8.4 this am; although she continued to report episodes of dark/bright blood per rectum with colon prep overnight.  Currently going to endoscopy for colonoscopy.    Assessment & Plan:   Principal Problem:   GI bleed Active Problems:   DM (diabetes mellitus), type 2 with renal complications (HCC)   Essential hypertension   CKD (chronic kidney disease) stage 5, GFR less than 15 ml/min (HCC)   Persistent atrial fibrillation (HCC)   Anemia due to stage 5 chronic kidney disease (HCC)   Prolonged QT interval   DNR (do not resuscitate)   Hematochezia   Acute blood loss anemia Lower GI bleed likely secondary to ischemic colitis Patient presenting from home with persistent rectal bleeding leading to syncopal episode.  Complicated by  history of A. fib on Eliquis, last dose a.m. of 11/27/2019.  History of GI bleed March 2020, EGD unrevealing and thought to be diverticular at that time.  Transfused 2 unit PRBC on 11/30/2019.  Colonoscopy 11/30/2019 with findings severe diverticulosis sigmoid colon/descending colon with erythematous mucosa ascending colon suggestive of ischemic colitis as source of her hematochezia.  -- GI following, appreciate assistance --Holding home Eliquis; can resume in 5 days per GI, will need to f/u Cards outpatient for discussion of long-term benefit --Hgb 10.2>8.7>6.5>9.0>9.4>8.4 --Protonix 40mg  IV BID --Transfuse for hemoglobin less than 7.0 or brisk/active bleeding  Chronic anemia of renal disease Anemia worsened with active GI bleed as above.  Elevated MCV.  Iron 2 8, TIBC 241, ferritin 79, folate 44, B12 534, consistent with anemia of chronic disease.  --Transfused 2 unit PRBC as above --Closely monitor CBC  Gout: Continue allopurinol 100 mg p.o. daily  Prolonged QTC QTC 604 on admission, likely exacerbated by GI bleed/acute blood loss anemia as above. --Avoid QT prolonging medications --Monitor on telemetry  Paroxysmal atrial fibrillation Patient is followed by cardiology, Dr. Kristopher Oppenheim outpatient.  On Eliquis. --Hold Eliquis for GI bleed as above, may need to consider discontinuation on discharge given recurrent GI bleed --Continue Bystolic 10 mg p.o. daily --Monitor on telemetry  CKD stage V Patient continues to make urine and not planning to pursue HD.  Follows with nephrology, Dr. Moshe Cipro outpatient.   --Cr 4.46> 3.99>4.13 --Avoid nephrotoxins, renally dose all medications --Follow BMP daily  Essential hypertension --Continue Bystolic 10 mg  p.o. daily  Type 2 diabetes mellitus Hemoglobin A1c 5.5 on 06/11/2019.  Diet controlled.   DVT prophylaxis: SCDs, chemical DVT prophylaxis contraindicated with active bleeding Code Status: DNR Family Communication: Updated patient  extensively at bedside, attempted to contact patient's husband, [name] via telephone unsuccessful  Disposition Plan:  Status is: Inpatient  Remains inpatient appropriate because:Ongoing diagnostic testing needed not appropriate for outpatient work up, Unsafe d/c plan and Inpatient level of care appropriate due to severity of illness   Dispo: The patient is from: Home              Anticipated d/c is to: Home              Anticipated d/c date is: 1 day              Patient currently is not medically stable to d/c.    Consultants:   [facility]  Procedures:   None  Antimicrobials:   None   Subjective: Patient seen and examined bedside, resting comfortably.  About to go down to get her colonoscopy this morning.  Transfused 2 unit PRBC yesterday with appropriate rise in hemoglobin, hemoglobin 8.4 this morning.  Reports some intermittent bright/maroon-colored blood in her stools with the colon prep overnight.  No other questions or concerns at this time. Denies headache, no current dizziness, no visual changes, no chest pain, palpitations, no shortness of breath, no abdominal pain.  No other acute events overnight per nursing staff.  Objective: Vitals:   11/30/19 0341 11/30/19 0727 11/30/19 0812 11/30/19 0900  BP: (!) 163/66 (!) 158/54 (!) 185/63 (!) 137/50  Pulse: 69 61 70 (!) 51  Resp: 19 18 13 15   Temp: 98.2 F (36.8 C) 97.7 F (36.5 C) 97.6 F (36.4 C) (!) 97 F (36.1 C)  TempSrc: Oral Oral Oral   SpO2: 99% 100% 99% 100%  Weight:   68 kg   Height:        Intake/Output Summary (Last 24 hours) at 11/30/2019 0915 Last data filed at 11/30/2019 0852 Gross per 24 hour  Intake 2020 ml  Output --  Net 2020 ml   Filed Weights   11/28/19 0502 11/30/19 0812  Weight: 68 kg 68 kg    Examination:  General exam: Appears calm and comfortable Respiratory system: Clear to auscultation. Respiratory effort normal.  Oxygenating well on room air Cardiovascular system: S1 & S2  heard, RRR. No JVD, murmurs, rubs, gallops or clicks. No pedal edema. Gastrointestinal system: Abdomen is nondistended, soft and nontender. No organomegaly or masses felt. Normal bowel sounds heard. Central nervous system: Alert and oriented. No focal neurological deficits. Extremities: Symmetric 5 x 5 power. Skin: No rashes, lesions or ulcers Psychiatry: Judgement and insight appear normal. Mood & affect appropriate.     Data Reviewed: I have personally reviewed following labs and imaging studies  CBC: Recent Labs  Lab 11/28/19 0530 11/28/19 0530 11/28/19 1200 11/29/19 0322 11/29/19 1518 11/29/19 2058 11/30/19 0249  WBC 7.0  --  7.4 6.5  --   --  7.2  NEUTROABS 5.0  --   --   --   --   --   --   HGB 10.2*   < > 8.7* 6.5* 9.0* 9.4* 8.4*  HCT 33.3*   < > 28.3* 20.3* 28.7* 28.1* 26.0*  MCV 108.5*  --  105.2* 105.7*  --   --  95.2  PLT 217  --  195 160  --   --  141*   < > =  values in this interval not displayed.   Basic Metabolic Panel: Recent Labs  Lab 11/28/19 0530 11/29/19 0322 11/30/19 0249  NA 141 139 143  K 3.3* 3.6 3.2*  CL 108 109 110  CO2 22 21* 20*  GLUCOSE 156* 140* 121*  BUN 68* 62* 62*  CREATININE 4.46* 3.99* 4.13*  CALCIUM 10.3 9.5 9.6  MG  --   --  1.5*   GFR: Estimated Creatinine Clearance: 9.2 mL/min (A) (by C-G formula based on SCr of 4.13 mg/dL (H)). Liver Function Tests: Recent Labs  Lab 11/28/19 0530  AST 23  ALT 15  ALKPHOS 125  BILITOT 0.3  PROT 6.1*  ALBUMIN 3.2*   No results for input(s): LIPASE, AMYLASE in the last 168 hours. No results for input(s): AMMONIA in the last 168 hours. Coagulation Profile: Recent Labs  Lab 11/28/19 0530  INR 1.2   Cardiac Enzymes: No results for input(s): CKTOTAL, CKMB, CKMBINDEX, TROPONINI in the last 168 hours. BNP (last 3 results) No results for input(s): PROBNP in the last 8760 hours. HbA1C: No results for input(s): HGBA1C in the last 72 hours. CBG: No results for input(s): GLUCAP in the  last 168 hours. Lipid Profile: No results for input(s): CHOL, HDL, LDLCALC, TRIG, CHOLHDL, LDLDIRECT in the last 72 hours. Thyroid Function Tests: No results for input(s): TSH, T4TOTAL, FREET4, T3FREE, THYROIDAB in the last 72 hours. Anemia Panel: Recent Labs    11/29/19 1501  VITAMINB12 534  FOLATE 44.0  FERRITIN 79  TIBC 241*  IRON 208*  RETICCTPCT 1.9   Sepsis Labs: No results for input(s): PROCALCITON, LATICACIDVEN in the last 168 hours.  Recent Results (from the past 240 hour(s))  SARS Coronavirus 2 by RT PCR (hospital order, performed in [facility] hospital lab) Nasopharyngeal Nasopharyngeal Swab     Status: None   Collection Time: 11/28/19  7:00 AM   Specimen: Nasopharyngeal Swab  Result Value Ref Range Status   SARS Coronavirus 2 NEGATIVE NEGATIVE Final    Comment: (NOTE) SARS-CoV-2 target nucleic acids are NOT DETECTED.  The SARS-CoV-2 RNA is generally detectable in upper and lower respiratory specimens during the acute phase of infection. The lowest concentration of SARS-CoV-2 viral copies this assay can detect is 250 copies / mL. A negative result does not preclude SARS-CoV-2 infection and should not be used as the sole basis for treatment or other patient management decisions.  A negative result may occur with improper specimen collection / handling, submission of specimen other than nasopharyngeal swab, presence of viral mutation(s) within the areas targeted by this assay, and inadequate number of viral copies (<250 copies / mL). A negative result must be combined with clinical observations, patient history, and epidemiological information.  Fact Sheet for Patients:   StrictlyIdeas.no  Fact Sheet for Healthcare Providers: BankingDealers.co.za  This test is not yet approved or  cleared by the [place] FDA and has been authorized for detection and/or diagnosis of SARS-CoV-2 by FDA under an Emergency Use  Authorization (EUA).  This EUA will remain in effect (meaning this test can be used) for the duration of the COVID-19 declaration under Section 564(b)(1) of the Act, 21 U.S.C. section 360bbb-3(b)(1), unless the authorization is terminated or revoked sooner.  Performed at [facility], [place] [address], [place], [place] [address]          Radiology Studies: No results found.      Scheduled Meds: . [MAR Hold] sodium chloride   Intravenous Once  . [MAR Hold] acetaminophen  1,000 mg Oral BID  . [MAR Hold] allopurinol  100 mg Oral Daily  . [MAR Hold] nebivolol  10 mg Oral Daily  . [MAR Hold] pantoprazole (PROTONIX) IV  40 mg Intravenous Q12H   Continuous Infusions: . lactated ringers 100 mL/hr at 11/30/19 0344     LOS: 1 day    Time spent: 36 minutes spent on chart review, discussion with nursing staff, consultants, updating family and interview/physical exam; more than 50% of that time was spent in counseling and/or coordination of care.    Jafet Wissing J [place] DO [facility] Available via Epic secure chat 7am-7pm After these hours, please refer to coverage provider listed on [email or web address] 11/30/2019, 9:15 AM

## 2019-11-30 NOTE — Anesthesia Postprocedure Evaluation (Signed)
Anesthesia Post Note  Patient: Danielle Frazier  Procedure(s) Performed: COLONOSCOPY WITH PROPOFOL (N/A )     Patient location during evaluation: PACU Anesthesia Type: MAC Level of consciousness: awake and alert Pain management: pain level controlled Vital Signs Assessment: post-procedure vital signs reviewed and stable Respiratory status: spontaneous breathing Cardiovascular status: stable Anesthetic complications: no   No complications documented.  Last Vitals:  Vitals:   11/30/19 1138 11/30/19 1552  BP: (!) 130/50 (!) 163/58  Pulse: (!) 57 61  Resp:  19  Temp: 36.4 C 36.8 C  SpO2: 98% 100%    Last Pain:  Vitals:   11/30/19 1552  TempSrc: Oral  PainSc:                  Nolon Nations

## 2019-11-30 NOTE — Anesthesia Preprocedure Evaluation (Addendum)
Anesthesia Evaluation  Patient identified by MRN, date of birth, ID band Patient awake    Reviewed: Allergy & Precautions, NPO status , Patient's Chart, lab work & pertinent test results  Airway Mallampati: II  TM Distance: >3 FB     Dental  (+) Missing, Dental Advisory Given   Pulmonary neg pulmonary ROS,    breath sounds clear to auscultation       Cardiovascular hypertension,  Rhythm:Regular Rate:Normal     Neuro/Psych    GI/Hepatic Neg liver ROS, GERD  ,  Endo/Other  diabetes  Renal/GU Renal disease     Musculoskeletal  (+) Arthritis ,   Abdominal   Peds  Hematology  (+) anemia ,   Anesthesia Other Findings   Reproductive/Obstetrics                            Anesthesia Physical  Anesthesia Plan  ASA: III  Anesthesia Plan: MAC   Post-op Pain Management:    Induction: Intravenous  PONV Risk Score and Plan: 2 and Ondansetron and Treatment may vary due to age or medical condition  Airway Management Planned: Simple Face Mask  Additional Equipment:   Intra-op Plan:   Post-operative Plan:   Informed Consent: I have reviewed the patients History and Physical, chart, labs and discussed the procedure including the risks, benefits and alternatives for the proposed anesthesia with the patient or authorized representative who has indicated his/her understanding and acceptance.   Patient has DNR.  Discussed DNR with patient and Suspend DNR.   Dental advisory given  Plan Discussed with: CRNA  Anesthesia Plan Comments:        Anesthesia Quick Evaluation

## 2019-11-30 NOTE — Transfer of Care (Signed)
Immediate Anesthesia Transfer of Care Note  Patient: GLINDA NATZKE  Procedure(s) Performed: COLONOSCOPY WITH PROPOFOL (N/A )  Patient Location: PACU  Anesthesia Type:MAC  Level of Consciousness: sedated  Airway & Oxygen Therapy: Patient Spontanous Breathing and Patient connected to nasal cannula oxygen  Post-op Assessment: Report given to RN and Post -op Vital signs reviewed and stable  Post vital signs: Reviewed and stable  Last Vitals:  Vitals Value Taken Time  BP    Temp    Pulse 51 11/30/19 0900  Resp 15 11/30/19 0900  SpO2 100 % 11/30/19 0900  Vitals shown include unvalidated device data.  Last Pain:  Vitals:   11/30/19 0812  TempSrc: Oral  PainSc: 0-No pain         Complications: No complications documented.

## 2019-12-01 DIAGNOSIS — E876 Hypokalemia: Secondary | ICD-10-CM

## 2019-12-01 LAB — CBC
HCT: 28.8 % — ABNORMAL LOW (ref 36.0–46.0)
Hemoglobin: 9.3 g/dL — ABNORMAL LOW (ref 12.0–15.0)
MCH: 31.2 pg (ref 26.0–34.0)
MCHC: 32.3 g/dL (ref 30.0–36.0)
MCV: 96.6 fL (ref 80.0–100.0)
Platelets: 182 10*3/uL (ref 150–400)
RBC: 2.98 MIL/uL — ABNORMAL LOW (ref 3.87–5.11)
RDW: 18.4 % — ABNORMAL HIGH (ref 11.5–15.5)
WBC: 8.3 10*3/uL (ref 4.0–10.5)
nRBC: 0.2 % (ref 0.0–0.2)

## 2019-12-01 LAB — BASIC METABOLIC PANEL
Anion gap: 13 (ref 5–15)
BUN: 56 mg/dL — ABNORMAL HIGH (ref 8–23)
CO2: 19 mmol/L — ABNORMAL LOW (ref 22–32)
Calcium: 10.5 mg/dL — ABNORMAL HIGH (ref 8.9–10.3)
Chloride: 112 mmol/L — ABNORMAL HIGH (ref 98–111)
Creatinine, Ser: 3.85 mg/dL — ABNORMAL HIGH (ref 0.44–1.00)
GFR calc Af Amer: 12 mL/min — ABNORMAL LOW (ref >60)
GFR calc non Af Amer: 10 mL/min — ABNORMAL LOW (ref >60)
Glucose, Bld: 101 mg/dL — ABNORMAL HIGH (ref 70–99)
Potassium: 2.9 mmol/L — ABNORMAL LOW (ref 3.5–5.1)
Sodium: 144 mmol/L (ref 135–145)

## 2019-12-01 LAB — MAGNESIUM: Magnesium: 2 mg/dL (ref 1.7–2.4)

## 2019-12-01 MED ORDER — POTASSIUM CHLORIDE CRYS ER 20 MEQ PO TBCR
40.0000 meq | EXTENDED_RELEASE_TABLET | ORAL | Status: AC
  Administered 2019-12-01 (×2): 40 meq via ORAL
  Filled 2019-12-01 (×2): qty 2

## 2019-12-01 MED ORDER — HYDRALAZINE HCL 20 MG/ML IJ SOLN
5.0000 mg | Freq: Once | INTRAMUSCULAR | Status: AC
  Administered 2019-12-01: 5 mg via INTRAVENOUS
  Filled 2019-12-01: qty 1

## 2019-12-01 MED ORDER — APIXABAN 2.5 MG PO TABS: 2.5000 mg | ORAL_TABLET | Freq: Two times a day (BID) | ORAL | Status: DC

## 2019-12-01 NOTE — Discharge Instructions (Signed)
Gastrointestinal Bleeding Gastrointestinal (GI) bleeding is bleeding somewhere along the digestive tract, between the mouth and the anus. This tract includes the mouth, esophagus, stomach, small intestine, large intestine, and anus. The large intestine is often called the colon. GI bleeding can be caused by various problems. The severity of these problems can range from mild to serious or even life-threatening. If you have GI bleeding, you may find blood in your stools (feces), you may have black stools, or you may vomit blood. If there is a lot of bleeding, you may need to stay in the hospital. What are the causes? This condition may be caused by:  Inflammation, irritation, or swelling of the esophagus (esophagitis). The esophagus is part of the body that moves food from your mouth to your stomach.  Swollen veins in the rectum (hemorrhoids).  Areas of painful tearing in the anus that are often caused by passing hard stool (anal fissures).  Pouches that form on the colon over time, with age, and may bleed a lot (diverticulosis).  Inflammation (diverticulitis) in areas with diverticulosis. This can cause pain, fever, and bloody stools, although bleeding may be mild.  Growths (polyps) or cancer. Colon cancer often starts out as precancerous polyps.  Gastritis and ulcers. With these, bleeding may come from the upper GI tract, near the stomach. What increases the risk? You are more likely to develop this condition if you:  Have an infection in your stomach from a type of bacteria called Helicobacter pylori.  Take certain medicines, such as: ? NSAIDs. ? Aspirin. ? Selective serotonin reuptake inhibitors (SSRIs). ? Steroids. ? Antiplatelet or anticoagulant medicines.  Smoke.  Drink alcohol. What are the signs or symptoms? Common symptoms of this condition include:  Bright red blood in your vomit, or vomit that looks like coffee grounds.  Bloody, black, or tarry stools. ? Bleeding  from the lower GI tract will usually cause red or maroon blood in the stools. ? Bleeding from the upper GI tract may cause black, tarry stools that are often stronger smelling than usual. ? In certain cases, if the bleeding is fast enough, the stools may be red.  Pain or cramping in the abdomen. How is this diagnosed? This condition may be diagnosed based on:  Your medical history and a physical exam.  Various tests, such as: ? Blood tests. ? Stool tests. ? X-rays and other imaging tests. ? Esophagogastroduodenoscopy (EGD). In this test, a flexible, lighted tube is used to look at your esophagus, stomach, and small intestine. ? Colonoscopy. In this test, a flexible, lighted tube is used to look at your colon. How is this treated? Treatment for this condition depends on the cause of the bleeding. For example:  For bleeding from the esophagus, stomach, small intestine, or colon, the health care provider doing your EGD or colonoscopy may be able to stop the bleeding as part of the procedure.  Inflammation or infection of the colon can be treated with medicines.  Certain rectal problems can be treated with creams, suppositories, or warm baths.  Medicines may be given to reduce acid in your stomach.  Surgery is sometimes needed.  Blood transfusions are sometimes needed if a lot of blood has been lost. If bleeding is mild, you may be allowed to go home. If there is a lot of bleeding, you will need to stay in the hospital for observation. Follow these instructions at home:   Take over-the-counter and prescription medicines only as told by your health care provider.    Eat foods that are high in fiber, such as beans, whole grains, and fresh fruits and vegetables. This will help to keep your stools soft. Eating 1-3 prunes each day works well for many people.  Drink enough fluid to keep your urine pale yellow.  Keep all follow-up visits as told by your health care provider. This is  important. Contact a health care provider if:  Your symptoms do not improve. Get help right away if:  Your bleeding does not stop.  You feel light-headed or you faint.  You feel weak.  You have severe cramps in your back or abdomen.  You pass large blood clots in your stool.  Your symptoms are getting worse.  You have chest pain or fast heartbeats. Summary  Gastrointestinal (GI) bleeding is bleeding somewhere along the digestive tract, between the mouth and anus. GI bleeding can be caused by various problems. The severity of these problems can range from mild to serious or even life-threatening.  Treatment for this condition depends on the cause of the bleeding.  Take over-the-counter and prescription medicines only as told by your health care provider.  Keep all follow-up visits as told by your health care provider. This is important.  Get help right away if your bleeding increases, your symptoms are getting worse, or you have new symptoms. This information is not intended to replace advice given to you by your health care provider. Make sure you discuss any questions you have with your health care provider. Document Revised: 10/11/2017 Document Reviewed: 10/11/2017 Elsevier Patient Education  [place].  Diverticulosis  Diverticulosis is a condition that develops when small pouches (diverticula) form in the wall of the large intestine (colon). The colon is where water is absorbed and stool (feces) is formed. The pouches form when the inside layer of the colon pushes through weak spots in the outer layers of the colon. You may have a few pouches or many of them. The pouches usually do not cause problems unless they become inflamed or infected. When this happens, the condition is called diverticulitis. What are the causes? The cause of this condition is not known. What increases the risk? The following factors may make you more likely to develop this  condition:  Being older than age 88. Your risk for this condition increases with age. Diverticulosis is rare among people younger than age 62. By age 48, many people have it.  Eating a low-fiber diet.  Having frequent constipation.  Being overweight.  Not getting enough exercise.  Smoking.  Taking over-the-counter pain medicines, like aspirin and ibuprofen.  Having a family history of diverticulosis. What are the signs or symptoms? In most people, there are no symptoms of this condition. If you do have symptoms, they may include:  Bloating.  Cramps in the abdomen.  Constipation or diarrhea.  Pain in the lower left side of the abdomen. How is this diagnosed? Because diverticulosis usually has no symptoms, it is most often diagnosed during an exam for other colon problems. The condition may be diagnosed by:  Using a flexible scope to examine the colon (colonoscopy).  Taking an X-ray of the colon after dye has been put into the colon (barium enema).  Having a CT scan. How is this treated? You may not need treatment for this condition. Your health care provider may recommend treatment to prevent problems. You may need treatment if you have symptoms or if you previously had diverticulitis. Treatment may include:  Eating a high-fiber diet.  Taking  a fiber supplement.  Taking a live bacteria supplement (probiotic).  Taking medicine to relax your colon. Follow these instructions at home: Medicines  Take over-the-counter and prescription medicines only as told by your health care provider.  If told by your health care provider, take a fiber supplement or probiotic. Constipation prevention Your condition may cause constipation. To prevent or treat constipation, you may need to:  Drink enough fluid to keep your urine pale yellow.  Take over-the-counter or prescription medicines.  Eat foods that are high in fiber, such as beans, whole grains, and fresh fruits and  vegetables.  Limit foods that are high in fat and processed sugars, such as fried or sweet foods.  General instructions  Try not to strain when you have a bowel movement.  Keep all follow-up visits as told by your health care provider. This is important. Contact a health care provider if you:  Have pain in your abdomen.  Have bloating.  Have cramps.  Have not had a bowel movement in 3 days. Get help right away if:  Your pain gets worse.  Your bloating becomes very bad.  You have a fever or chills, and your symptoms suddenly get worse.  You vomit.  You have bowel movements that are bloody or black.  You have bleeding from your rectum. Summary  Diverticulosis is a condition that develops when small pouches (diverticula) form in the wall of the large intestine (colon).  You may have a few pouches or many of them.  This condition is most often diagnosed during an exam for other colon problems.  Treatment may include increasing the fiber in your diet, taking supplements, or taking medicines. This information is not intended to replace advice given to you by your health care provider. Make sure you discuss any questions you have with your health care provider. Document Revised: 09/27/2018 Document Reviewed: 09/27/2018 Elsevier Patient Education  [place].  Ischemic Colitis  Ischemic colitis is damage to the large intestine due to reduced blood flow (ischemia) to the colon. The colon is the last section of the large intestine, where stool is formed. The reduced blood flow may lead to the death of cells (necrosis) in the lining of the colon, damaging the colon and often causing bleeding. Most cases of ischemic colitis clear up in a few days with treatment. In other cases, blood flow does not improve, and parts of the colon start to die. This is extremely serious and even life-threatening. If this happens, surgery may be required. In some cases, parts of the colon may  need to be removed. What are the causes? Ischemic colitis results from a decrease in the blood supply to the colon. Many conditions can cause this, such as:  Heart problems that reduce blood flow to the arteries that supply the colon. These include problems such as coronary heart disease, peripheral vascular disease, atrial fibrillation, and congestive heart failure.  Low blood pressure from: ? An infection that spreads to the blood (sepsis). ? Dehydration or bleeding (shock).  Drugs that narrow blood vessels (vasoconstrictors). Sometimes the cause is not known. What increases the risk? You are more likely to develop this condition if:  You are 43 years of age or older.  You are female.  You have another medical condition, such as: ? Heart disease. ? Diabetes. ? Kidney disease that requires you to be on dialysis. ? A disease that causes blood clots.  You are frequently constipated.  You have had surgery on the  heart, blood vessels (such as the aorta), or colon.  You take certain medicines or drugs, such as: ? Medicines that suppress your immune system (immunomodulators). ? Medicines that cause constipation. ? Illegal drugs, such as cocaine or methamphetamines.  You get an extreme amount of exercise from long-distance bike riding or running. What are the signs or symptoms? Symptoms of this condition start suddenly and may include:  Dull pain, usually on the left side of the abdomen.  Tenderness of the abdomen.  Abdomen (abdominal) cramps.  An urgent need to have a bowel movement.  Loose, bloody stools with clots of dark or bright red blood.  Nausea and vomiting.  Fever.  Weakness, fatigue, and confusion. How is this diagnosed? This condition may be diagnosed based on:  Your symptoms, your medical history, and a physical exam.  Tests to find out more about your condition and to rule out other causes of pain and bleeding. These tests may include: ? Blood tests  to check for clotting, blood loss, and low proteins in your blood. ? CT scan of the colon. ? A procedure to examine the inside of your colon using a scope that is passed through the rectum (colonoscopy). Colonoscopy is the most important diagnostic test. During this test, your health care provider may take a small piece of tissue from your colon to be examined under a microscope (biopsy). How is this treated? You may be hospitalized for treatment. Treatment usually includes:  Not eating or drinking anything. This allows the colon to rest.  IV fluids to maintain blood pressure, regulate blood minerals (electrolytes), and provide nutrition.  Having a tube inserted into your stomach through your nose (nasogastric tube) to drain your stomach.  IV antibiotic medicines. These may be used if an infection is suspected.  Stopping or changing medicines that may be causing the condition. You may need surgery if your condition is severe or if it gets worse or does not get better after a few days. Parts of the colon that will not recover may need to be removed. In some cases, a procedure is also done to attach the healthy part of the colon to the outer wall of the abdomen to drain stool (colostomy). Follow these instructions at home:  Follow instructions from your health care provider about eating or drinking restrictions.  Drink enough fluid to keep your urine clear or pale yellow.  Take over-the-counter and prescription medicines only as told by your health care provider.  Return to your normal activities as told by your health care provider. Ask your health care provider what activities are safe for you.  Do not use any products that contain nicotine or tobacco, such as cigarettes and e-cigarettes. If you need help quitting, ask your health care provider.  Keep all follow-up visits as told by your health care provider. This is important. Contact a health care provider if:  You have blood in your  stool.  You have abdominal pain or cramps.  You have constipation.  You have nausea or vomiting. Get help right away if:  You have a moderate to large amount of loose, bloody stools with clots of dark or bright red blood.  You have severe abdominal pain.  Your abdominal pain has not improved after 24 hours.  You have a fever.  You have not been able to have a bowel movement, and you are in pain and vomiting.  You have shortness of breath.  You are very tired (lethargic) or have confusion. Summary  Ischemic colitis is damage to the large intestine due to reduced blood flow (ischemia) to the colon.  Some of the symptoms of this condition include abdominal pain or tenderness, bloody stools, and an urgent need to have a bowel movement.  Diagnosis usually includes a procedure to examine the inside of the colon using a scope that is passed through the rectum (colonoscopy). This information is not intended to replace advice given to you by your health care provider. Make sure you discuss any questions you have with your health care provider. Document Revised: 06/22/2018 Document Reviewed: 04/18/2016 Elsevier Patient Education  [place].

## 2019-12-01 NOTE — Care Management (Signed)
Patient given 30 day free card for Eliquis.  She was on Eliquis previously.

## 2019-12-01 NOTE — Discharge Summary (Signed)
Physician Discharge Summary  Danielle Frazier JJK:[id number] DOB: [birth year] DOA: 11/28/2019  PCP: [name], MD  Admit date: 11/28/2019 Discharge date: 12/01/2019  Admitted From: Home Disposition: Home  Recommendations for Outpatient Follow-up:  1. Follow up with PCP in 1-2 weeks 2. Follow-up with cardiology in 1-2 weeks, Dr. Acie Fredrickson for discussion of continuation of anticoagulation with Eliquis with recurrent GI bleed 3. May resume home Eliquis 12/07/2019 4. Please obtain BMP/CBC in one week at PCP/specialist visit  Home Health: No Equipment/Devices: None  Discharge Condition: Stable CODE STATUS: Full code Diet recommendation: Heart healthy diet  History of present illness:  Danielle Frazier is a 86 year old female with past medical history notable for CKD stage V, essential hypertension, hyperlipidemia, atrial fibrillation on Eliquis, and type 2 diabetes mellitus who presented to the ED with rectal bleeding.  Night prior, patient woke up from sleep and noted that she had frank blood within her bowel movement.  This occurred every hour until EMS arrival and she ultimately syncopized walking into the bathroom and fell on the floor.  Patient with previous GI bleed in March 2020 with unremarkable EGD and bleeding thought to be diverticular.  In the ED, patient was noted to have a large volume dark red blood.  She was on Eliquis with last dose a.m. 9/15.  Hemoglobin on arrival was noted be 10.2 with MCV of 108.5.  GI was consulted, [facility] consulted for admission.  Hospital course:  Acute blood loss anemia Lower GI bleed likely secondary to ischemic colitis Patient presenting from home with persistent rectal bleeding leading to syncopal episode.  Complicated by history of A. fib on Eliquis, last dose a.m. of 11/27/2019.  History of GI bleed March 2020, EGD unrevealing and thought to be diverticular at that time.  Transfused 2 unit PRBC on 11/30/2019.  Colonoscopy 11/30/2019 with findings  severe diverticulosis sigmoid colon/descending colon with erythematous mucosa ascending colon suggestive of ischemic colitis as source of her hematochezia.   Patient's hemoglobin stabilized and was 9.3 at time of discharge.  We'll continue to hold home Eliquis and may resume on 12/07/2019.  Will need to follow-up with her primary cardiologist, Dr. Acie Fredrickson, for discussion of long-term benefit in the setting of recurrent GI bleeding.  Recommend CBC in the next PCP/specialist visit.  Hypokalemia Likely secondary to bowel prep.  Repleted.  Repeat BMP in 1-2 weeks.  Chronic anemia of renal disease Anemia worsened with active GI bleed as above.  Elevated MCV.  Iron 2 8, TIBC 241, ferritin 79, folate 44, B12 534, consistent with anemia of chronic disease.  Transfused 2 unit PRBCs during hospitalization.  Hemoglobin 9.3 at time of discharge.  Gout: Continue allopurinol 100 mg p.o. daily  Prolonged QTC QTC 604 on admission.  Recommend avoidance of QT prolonging medications  Paroxysmal atrial fibrillation Patient is followed by cardiology, Dr. Kristopher Oppenheim outpatient.    Continue Bystolic 10 mg p.o. daily.  Holding Eliquis until 12/07/2019, then may resume.  Outpatient follow-up with cardiology, for discussion of long-term benefit.    CKD stage V Patient continues to make urine and not planning to pursue HD.  Follows with nephrology, Dr. Moshe Cipro outpatient.    Creatinine 3.85 at time of discharge.  Recommend repeat BMP in 1-2 weeks.  Essential hypertension Continue Bystolic 10 mg p.o. daily  Type 2 diabetes mellitus Hemoglobin A1c 5.5 on 06/11/2019.  Diet controlled.  Discharge Diagnoses:  Active Problems:   DM (diabetes mellitus), type 2 with renal complications [facility])   Essential hypertension  CKD (chronic kidney disease) stage 5, GFR less than 15 ml/min (HCC)   Persistent atrial fibrillation (HCC)   Anemia due to stage 5 chronic kidney disease (HCC)   Prolonged QT interval   DNR (do not  resuscitate)    Discharge Instructions  Discharge Instructions    Call MD for:  difficulty breathing, headache or visual disturbances   Complete by: As directed    Call MD for:  extreme fatigue   Complete by: As directed    Call MD for:  persistant dizziness or light-headedness   Complete by: As directed    Call MD for:  persistant nausea and vomiting   Complete by: As directed    Call MD for:  temperature >100.4   Complete by: As directed    Diet - low sodium heart healthy   Complete by: As directed    Increase activity slowly   Complete by: As directed      Allergies as of 12/01/2019      Reactions   Metformin Other (See Comments)   REACTION: renal damage   Crestor [rosuvastatin] Other (See Comments)   Muscle aches      Medication List    STOP taking these medications   oxyCODONE 5 MG immediate release tablet Commonly known as: Oxy IR/ROXICODONE     TAKE these medications   acetaminophen 500 MG tablet Commonly known as: TYLENOL Take 1,000 mg by mouth in the morning and at bedtime.   allopurinol 100 MG tablet Commonly known as: ZYLOPRIM Take 1 tablet (100 mg total) by mouth daily.   apixaban 2.5 MG Tabs tablet Commonly known as: Eliquis Take 1 tablet (2.5 mg total) by mouth 2 (two) times daily. Start taking on: December 07, 2019 What changed:   how much to take  These instructions start on December 07, 2019. If you are unsure what to do until then, ask your doctor or other care provider.   Bayer Contour Monitor w/Device Kit 1 Act by Does not apply route 2 (two) times daily.   Bystolic 10 MG tablet Generic drug: nebivolol TAKE 1 TABLET BY MOUTH EVERY DAY What changed: how much to take   CENTRUM SILVER PO Take 1 tablet by mouth daily.   famotidine 40 MG tablet Commonly known as: PEPCID Take 40 mg by mouth daily.   fluticasone 50 MCG/ACT nasal spray Commonly known as: FLONASE Place 2 sprays into both nostrils as needed for allergies.    furosemide 40 MG tablet Commonly known as: LASIX Take 40 mg by mouth daily.   glucose blood test strip Commonly known as: Estate manager/land agent Use BID   Linzess 145 MCG Caps capsule Generic drug: linaclotide TAKE 1 CAPSULE BY MOUTH EVERY DAY What changed: how much to take   Vitamin D 50 MCG (2000 UT) tablet Take 2,000 Units by mouth daily.       Follow-up Information    [name], MD. Schedule an appointment as soon as possible for a visit in 1 week(s).   Specialty: Internal Medicine Contact information: [place] [address] [phone]        Nahser, Wonda Cheng, MD. Schedule an appointment as soon as possible for a visit in 1 week(s).   Specialty: Cardiology Contact information: [address] [address] [phone]              Allergies  Allergen Reactions  . Metformin Other (See Comments)    REACTION: renal damage  .  Crestor [Rosuvastatin] Other (See Comments)    Muscle aches    Consultations:  [place] [facility]   Procedures/Studies:  Colonoscopy 11/30/2019: Impression:       - Severe diverticulosis in the sigmoid colon and in                            the descending colon.                           - Erythematous mucosa in the ascending colon.                           - Stool in the entire examined colon.                           - Non-bleeding internal hemorrhoids.                           - No specimens collected.  Recommendation: - Patient has a contact number available for                            emergencies. The signs and symptoms of potential                            delayed complications were discussed with the                            patient. Return to normal activities tomorrow.                            Written discharge instructions were provided to the                            patient.                           - Resume previous diet.                            - Continue present medications.                           - Resume Eliquis (apixaban) at prior dose in 5                            days. Refer to managing physician for further                            adjustment of therapy. Will need to discuss long                            term anticoagulation benefit vs risk given h/o                            recurrent significant GI bleed                           -  No repeat colonoscopy due to age.  Subjective: Patient seen and examined bedside, resting comfortably.  Sitting in bedside chair eating breakfast.  No complaints this morning.  Does not report any further blood in stool.  Hemoglobin stable, up to 9.3 this morning.  Ready for discharge home.  Denies headache, no fever/chills/night sweats, no nausea/vomiting/diarrhea, no chest pain, no palpitations, no shortness of breath, no abdominal pain.  No acute events overnight per nursing staff.  Discharge Exam: Vitals:   12/01/19 0644 12/01/19 0748  BP: (!) 174/62 (!) 181/71  Pulse: 70 87  Resp: (!) 21 20  Temp:  98.3 F (36.8 C)  SpO2: 97% 96%   Vitals:   12/01/19 0400 12/01/19 0500 12/01/19 0644 12/01/19 0748  BP: (!) 188/80 (!) 165/75 (!) 174/62 (!) 181/71  Pulse: 64  70 87  Resp: (!) 21  (!) 21 20  Temp:    98.3 F (36.8 C)  TempSrc:    Oral  SpO2: 96%  97% 96%  Weight:      Height:        General: Pt is alert, awake, not in acute distress Cardiovascular: RRR, S1/S2 +, no rubs, no gallops Respiratory: CTA bilaterally, no wheezing, no rhonchi Abdominal: Soft, NT, ND, bowel sounds + Extremities: no edema, no cyanosis    The results of significant diagnostics from this hospitalization (including imaging, microbiology, ancillary and laboratory) are listed below for reference.     Microbiology: Recent Results (from the past 240 hour(s))  SARS Coronavirus 2 by RT PCR (hospital order, performed in [facility] hospital lab) Nasopharyngeal Nasopharyngeal Swab     Status: None    Collection Time: 11/28/19  7:00 AM   Specimen: Nasopharyngeal Swab  Result Value Ref Range Status   SARS Coronavirus 2 NEGATIVE NEGATIVE Final    Comment: (NOTE) SARS-CoV-2 target nucleic acids are NOT DETECTED.  The SARS-CoV-2 RNA is generally detectable in upper and lower respiratory specimens during the acute phase of infection. The lowest concentration of SARS-CoV-2 viral copies this assay can detect is 250 copies / mL. A negative result does not preclude SARS-CoV-2 infection and should not be used as the sole basis for treatment or other patient management decisions.  A negative result may occur with improper specimen collection / handling, submission of specimen other than nasopharyngeal swab, presence of viral mutation(s) within the areas targeted by this assay, and inadequate number of viral copies (<250 copies / mL). A negative result must be combined with clinical observations, patient history, and epidemiological information.  Fact Sheet for Patients:   StrictlyIdeas.no  Fact Sheet for Healthcare Providers: BankingDealers.co.za  This test is not yet approved or  cleared by the [place] FDA and has been authorized for detection and/or diagnosis of SARS-CoV-2 by FDA under an Emergency Use Authorization (EUA).  This EUA will remain in effect (meaning this test can be used) for the duration of the COVID-19 declaration under Section 564(b)(1) of the Act, 21 U.S.C. section 360bbb-3(b)(1), unless the authorization is terminated or revoked sooner.  Performed at [facility], [place] [address] [place], [place] [address]      Labs: BNP (last 3 results) No results for input(s): BNP in the last 8760 hours. Basic Metabolic Panel: Recent Labs  Lab 11/28/19 0530 11/29/19 0322 11/30/19 0249 12/01/19 0819  NA 141 139 143 144  K 3.3* 3.6 3.2* 2.9*  CL 108 109 110 112*  CO2 22 21* 20* 19*  GLUCOSE 156* 140* 121*  101*  BUN 68* 62* 62* 56*  CREATININE 4.46* 3.99* 4.13* 3.85*  CALCIUM 10.3 9.5 9.6 10.5*  MG  --   --  1.5* 2.0   Liver Function Tests: Recent Labs  Lab 11/28/19 0530  AST 23  ALT 15  ALKPHOS 125  BILITOT 0.3  PROT 6.1*  ALBUMIN 3.2*   No results for input(s): LIPASE, AMYLASE in the last 168 hours. No results for input(s): AMMONIA in the last 168 hours. CBC: Recent Labs  Lab 11/28/19 0530 11/28/19 0530 11/28/19 1200 11/28/19 1200 11/29/19 0322 11/29/19 0322 11/29/19 1518 11/29/19 2058 11/30/19 0249 11/30/19 1504 12/01/19 0819  WBC 7.0  --  7.4  --  6.5  --   --   --  7.2  --  8.3  NEUTROABS 5.0  --   --   --   --   --   --   --   --   --   --   HGB 10.2*   < > 8.7*   < > 6.5*   < > 9.0* 9.4* 8.4* 7.9* 9.3*  HCT 33.3*   < > 28.3*   < > 20.3*   < > 28.7* 28.1* 26.0* 24.6* 28.8*  MCV 108.5*  --  105.2*  --  105.7*  --   --   --  95.2  --  96.6  PLT 217  --  195  --  160  --   --   --  141*  --  182   < > = values in this interval not displayed.   Cardiac Enzymes: No results for input(s): CKTOTAL, CKMB, CKMBINDEX, TROPONINI in the last 168 hours. BNP: Invalid input(s): POCBNP CBG: Recent Labs  Lab 11/30/19 0903  GLUCAP 89   D-Dimer No results for input(s): DDIMER in the last 72 hours. Hgb A1c No results for input(s): HGBA1C in the last 72 hours. Lipid Profile No results for input(s): CHOL, HDL, LDLCALC, TRIG, CHOLHDL, LDLDIRECT in the last 72 hours. Thyroid function studies No results for input(s): TSH, T4TOTAL, T3FREE, THYROIDAB in the last 72 hours.  Invalid input(s): FREET3 Anemia work up Recent Labs    11/29/19 1501  VITAMINB12 534  FOLATE 44.0  FERRITIN 79  TIBC 241*  IRON 208*  RETICCTPCT 1.9   Urinalysis    Component Value Date/Time   COLORURINE YELLOW 06/11/2019 0930   APPEARANCEUR CLEAR 06/11/2019 0930   LABSPEC 1.020 06/11/2019 0930   PHURINE 6.0 06/11/2019 0930   GLUCOSEU NEGATIVE 06/11/2019 0930   HGBUR TRACE-INTACT (A)  06/11/2019 0930   BILIRUBINUR NEGATIVE 06/11/2019 0930   KETONESUR NEGATIVE 06/11/2019 0930   UROBILINOGEN 0.2 06/11/2019 0930   NITRITE NEGATIVE 06/11/2019 0930   LEUKOCYTESUR NEGATIVE 06/11/2019 0930   Sepsis Labs Invalid input(s): PROCALCITONIN,  WBC,  LACTICIDVEN Microbiology Recent Results (from the past 240 hour(s))  SARS Coronavirus 2 by RT PCR (hospital order, performed in [place] hospital lab) Nasopharyngeal Nasopharyngeal Swab     Status: None   Collection Time: 11/28/19  7:00 AM   Specimen: Nasopharyngeal Swab  Result Value Ref Range Status   SARS Coronavirus 2 NEGATIVE NEGATIVE Final    Comment: (NOTE) SARS-CoV-2 target nucleic acids are NOT DETECTED.  The SARS-CoV-2 RNA is generally detectable in upper and lower respiratory specimens during the acute phase of infection. The lowest concentration of SARS-CoV-2 viral copies this assay can detect is 250 copies / mL. A negative result does not preclude SARS-CoV-2 infection and should not be used as the sole  basis for treatment or other patient management decisions.  A negative result may occur with improper specimen collection / handling, submission of specimen other than nasopharyngeal swab, presence of viral mutation(s) within the areas targeted by this assay, and inadequate number of viral copies (<250 copies / mL). A negative result must be combined with clinical observations, patient history, and epidemiological information.  Fact Sheet for Patients:   StrictlyIdeas.no  Fact Sheet for Healthcare Providers: BankingDealers.co.za  This test is not yet approved or  cleared by the [place] FDA and has been authorized for detection and/or diagnosis of SARS-CoV-2 by FDA under an Emergency Use Authorization (EUA).  This EUA will remain in effect (meaning this test can be used) for the duration of the COVID-19 declaration under Section 564(b)(1) of the Act, 21  U.S.C. section 360bbb-3(b)(1), unless the authorization is terminated or revoked sooner.  Performed at [facility], [place] [address], [place], [place] [address]      Time coordinating discharge: Over 30 minutes  SIGNED:   Manav Pierotti J [place] DO  [facility] 12/01/2019, 10:33 AM

## 2019-12-01 NOTE — Progress Notes (Signed)
Discharge teaching complete. Meds, diet, activity, follow up appointments reviewed and all questions answered. Copy of instructions given to patient.  

## 2019-12-03 ENCOUNTER — Encounter (HOSPITAL_COMMUNITY): Payer: Self-pay | Admitting: Gastroenterology

## 2019-12-05 ENCOUNTER — Other Ambulatory Visit: Payer: Self-pay

## 2019-12-05 ENCOUNTER — Ambulatory Visit: Payer: Medicare Other | Admitting: Physician Assistant

## 2019-12-05 ENCOUNTER — Encounter: Payer: Self-pay | Admitting: Physician Assistant

## 2019-12-05 VITALS — BP 150/70 | HR 72 | Ht 66.0 in | Wt 153.0 lb

## 2019-12-05 DIAGNOSIS — N185 Chronic kidney disease, stage 5: Secondary | ICD-10-CM

## 2019-12-05 DIAGNOSIS — I10 Essential (primary) hypertension: Secondary | ICD-10-CM | POA: Diagnosis not present

## 2019-12-05 DIAGNOSIS — I4819 Other persistent atrial fibrillation: Secondary | ICD-10-CM

## 2019-12-05 DIAGNOSIS — D539 Nutritional anemia, unspecified: Secondary | ICD-10-CM

## 2019-12-05 NOTE — Progress Notes (Signed)
Cardiology Office Note    Date:  12/05/2019   ID:  Danielle Frazier, DOB 05/17/1930, MRN [id number]  PCP:  [name], MD  Cardiologist:  Dr. Acie Fredrickson  Chief Complaint: Hospital follow up   History of Present Illness:   Danielle Frazier is a 86 y.o. female persistent atrial fibrillation, hypertension, diabetes mellitus, hyperlipidemia, morbid obesity and CKD (followed by Dr. Moshe Cipro) seen for hospital follow-up.  History of anemia requiring transfusion.  She cannot that her heart rate is irregular.  Most recently admitted 11/2019 secondary to acute blood loss/lower GI bleed secondary to ischemic colitis.  Patient had syncope secondary to rectal bleeding.Colonoscopy 11/30/2019 with findings severe diverticulosis sigmoid colon/descending colon with erythematous mucosa ascending colon suggestive of ischemic colitis as source of her hematochezia.  Eliquis resume on 12/07/19.   Here today for follow up.  No recurrent bleeding.  Denies chest pain, shortness of breath, orthopnea, PND, syncope, lower extremity edema or melena.   Past Medical History:  Diagnosis Date  . Abdominal tenderness, RLQ (right lower quadrant) 04/24/2013  . Acute bacterial sinusitis 03/22/2013  . Acute bronchitis 03/24/2018   1/20  . Anemia associated with acute blood loss 06/04/2018  . Bilateral renal cysts 09/07/2011  . BRBPR (bright red blood per rectum) 06/04/2018  . Cerumen impaction 03/24/2018   1/20 L  . CKD (chronic kidney disease) stage 5, GFR less than 15 ml/min (HCC) 08/25/2008   Estimated Creatinine Clearance: 12.7 mL/min (A) (by C-G formula based on SCr of 3.58 mg/dL (H)).  . Constipation, chronic 04/24/2013  . Diabetes mellitus    type II  . 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 DM (diabetes mellitus), type 2 with renal complications ([place]) 2/56/3893  . ELECTROCARDIOGRAM, ABNORMAL 08/25/2008   Qualifier: Diagnosis of  By: Ronnald Ramp MD, Arvid Right.   . Essential hypertension 08/25/2008  . Gastroesophageal reflux  disease without esophagitis 07/06/2015  . GI bleeding 06/07/2018  . Gout   . GOUT 08/25/2008       . Greater trochanteric bursitis of right hip 11/07/2017   Injected November 07, 2017 repeat injection Jul 13, 2018 repeat injection September 20, 2018  . Hip pain, chronic, right 07/23/2012   Dg Hip Complete Right  07/23/2012   *RADIOLOGY REPORT*  Clinical Data: Right hip pain  RIGHT HIP - COMPLETE 2+ VIEW  Comparison: None.  Findings: No fracture or dislocation is seen.  Visualized bony pelvis appears intact.  Bilateral hip joint spaces are symmetric.  Degenerative changes of the lower lumbar spine.  IMPRESSION: No acute osseous abnormality is seen.   Original Report Authenticated By:   . Hyperlipidemia   . Hyperlipidemia with target LDL less than 100 12/30/2009  . Hypertension   . Insomnia secondary to chronic pain 07/11/2018  . LBP (low back pain)    foraminal narrowing at L4-5 and disc bulge at L5- S1 on MRI 6-08  . Lumbar radiculopathy 12/11/2017  . Morbid obesity ([place]) 03/22/2013  . Otitis media 03/24/2018   1/20 R and possibly L  . Persistent atrial fibrillation ([place]) 06/07/2018  . Renal insufficiency   . Right hip pain 10/16/2017  . Shoulder injury 05/21/2012  . Urinary incontinence   . Uterine fibroid 05/16/2013  . UTI (lower urinary tract infection) 04/24/2013    Past Surgical History:  Procedure Laterality Date  . COLONOSCOPY WITH PROPOFOL N/A 11/30/2019   Procedure: COLONOSCOPY WITH PROPOFOL;  Surgeon: [name], MD;  Location: [facility];  Service: Endoscopy;  Laterality: N/A;  .  ESOPHAGOGASTRODUODENOSCOPY (EGD) WITH PROPOFOL N/A 06/08/2018   Procedure: ESOPHAGOGASTRODUODENOSCOPY (EGD) WITH PROPOFOL;  Surgeon: [name], MD;  Location: [facility];  Service: Gastroenterology;  Laterality: N/A;    Current Medications: Prior to Admission medications   Medication Sig Start Date End Date Taking? Authorizing Provider  acetaminophen (TYLENOL) 500 MG tablet Take 1,000 mg by mouth in  the morning and at bedtime.    [provider]  allopurinol (ZYLOPRIM) 100 MG tablet Take 1 tablet (100 mg total) by mouth daily. 09/13/19   [name], MD  apixaban (ELIQUIS) 2.5 MG TABS tablet Take 1 tablet (2.5 mg total) by mouth 2 (two) times daily. 12/07/19   [place] Donnamarie Poag, DO  Blood Glucose Monitoring Suppl (BAYER CONTOUR MONITOR) W/DEVICE KIT 1 Act by Does not apply route 2 (two) times daily. 05/06/11   [name], MD  BYSTOLIC 10 MG tablet TAKE 1 TABLET BY MOUTH EVERY DAY Patient taking differently: Take 10 mg by mouth daily.  11/25/19   [name], MD  Cholecalciferol (VITAMIN D) 50 MCG (2000 UT) tablet Take 2,000 Units by mouth daily.    [provider]  famotidine (PEPCID) 40 MG tablet Take 40 mg by mouth daily. 07/15/19   [provider]  fluticasone (FLONASE) 50 MCG/ACT nasal spray Place 2 sprays into both nostrils as needed for allergies. 09/11/19   [provider]  furosemide (LASIX) 40 MG tablet Take 40 mg by mouth daily. 08/22/19   [provider]  glucose blood (BAYER CONTOUR TEST) test strip Use BID 05/06/11   [name], MD  LINZESS 145 MCG CAPS capsule TAKE 1 CAPSULE BY MOUTH EVERY DAY Patient taking differently: Take 145 mcg by mouth daily.  03/13/19   [name], MD  Multiple Vitamins-Minerals (CENTRUM SILVER PO) Take 1 tablet by mouth daily.      [provider]  apixaban (ELIQUIS) 2.5 MG TABS tablet Take 1 tablet (2.5 mg total) by mouth 2 (two) times daily. 12/12/18   [name], MD  nebivolol (BYSTOLIC) 10 MG tablet Take 10 mg by mouth 2 (two) times daily.    [provider]    Allergies:   Metformin and Crestor [rosuvastatin]   Social History   Socioeconomic History  . Marital status: Married    Spouse name: Not on file  . Number of children: Not on file  . Years of education: Not on file  . Highest education level: Not on file  Occupational History  . Not on file  Tobacco Use    . Smoking status: Never Smoker  . Smokeless tobacco: Never Used  Vaping Use  . Vaping Use: Never used  Substance and Sexual Activity  . Alcohol use: No  . Drug use: No  . Sexual activity: Not Currently  Other Topics Concern  . Not on file  Social History Narrative  . Not on file   Social Determinants of Health   Financial Resource Strain:   . Difficulty of Paying Living Expenses: Not on file  Food Insecurity:   . Worried About Charity fundraiser in the Last Year: Not on file  . Ran Out of Food in the Last Year: Not on file  Transportation Needs:   . Lack of Transportation (Medical): Not on file  . Lack of Transportation (Non-Medical): Not on file  Physical Activity:   . Days of Exercise per Week: Not on file  . Minutes of Exercise per Session: Not on file  Stress:   . Feeling of Stress : Not on file  Social Connections:   . Frequency of Communication with Friends and Family: Not on file  . Frequency of Social Gatherings with Friends and Family: Not on file  . Attends Religious Services: Not on file  . Active Member of Clubs or Organizations: Not on file  . Attends Archivist Meetings: Not on file  . Marital Status: Not on file     Family History:  The patient's family history includes Diabetes in an other family member; Hypertension in an other family member.   ROS:   Please see the history of present illness.    ROS All other systems reviewed and are negative.   PHYSICAL EXAM:   VS:  BP (!) 150/70   Pulse 72   Ht _0  (1.676 m)   Wt 153 lb (69.4 kg)   SpO2 99%   BMI 24.69 kg/m    GEN: Well nourished, well developed, in no acute distress  HEENT: normal  Neck: no JVD, carotid bruits, or masses Cardiac: Irregularly irregular; no murmurs, rubs, or gallops,no edema  Respiratory:  clear to auscultation bilaterally, normal work of breathing GI: soft, nontender, nondistended, + BS MS: no deformity or atrophy  Skin: warm and dry, no rash Neuro:  Alert  and Oriented x 3, Strength and sensation are intact Psych: euthymic mood, full affect  Wt Readings from Last 3 Encounters:  12/05/19 153 lb (69.4 kg)  11/30/19 149 lb 14.6 oz (68 kg)  09/11/19 147 lb 4 oz (66.8 kg)      Studies/Labs Reviewed:   EKG:  EKG is not ordered today.   Recent Labs: 11/28/2019: ALT 15 12/01/2019: BUN 56; Creatinine, Ser 3.85; Hemoglobin 9.3; Magnesium 2.0; Platelets 182; Potassium 2.9; Sodium 144   Lipid Panel    Component Value Date/Time   CHOL 154 11/12/2018 0939   TRIG 123.0 11/12/2018 0939   HDL 52.20 11/12/2018 0939   CHOLHDL 3 11/12/2018 0939   VLDL 24.6 11/12/2018 0939   LDLCALC 78 11/12/2018 0939   LDLDIRECT 90.0 07/06/2015 0957    Additional studies/ records that were reviewed today include:   Echocardiogram: 11/2018 1. Left ventricular ejection fraction, by visual estimation, is 40%. The  left ventricle has mild to moderately decreased function. Normal left  ventricular size. There is mildly increased left ventricular hypertrophy.  Diffuse hypokinesis.  2. The mitral valve is normal in structure. Moderate mitral valve  regurgitation. No evidence of mitral stenosis.  3. The tricuspid valve is normal in structure. Tricuspid valve  regurgitation is mild.  4. The aortic valve is tricuspid Aortic valve regurgitation is trivial by  color flow Doppler. Mild aortic valve sclerosis without stenosis.  5. Left ventricular diastolic Doppler parameters are indeterminate  pattern of LV diastolic filling.  6. Left atrial size was severely dilated.  7. Right atrial size was mild-moderately dilated.  8. The inferior vena cava is normal in size with greater than 50%  respiratory variability, suggesting right atrial pressure of 3 mmHg.  9. The tricuspid regurgitant velocity is 3.44 m/s, and with an assumed  right atrial pressure of 3 mmHg, the estimated right ventricular systolic  pressure is moderately elevated at 50.3 mmHg.  10. Global right  ventricle has mildly reduced systolic function.The right  ventricular size is normal. No increase in right ventricular wall  thickness.  11. The patient appeared to be in atrial fibrillation.     ASSESSMENT & PLAN:  1. Persistent atrial fibrillation Rate control.  On bystolic.  Anticoagulation as below.  2. Chronic anticoagulation - Hx of Gi bleed in 05/2018 requiring transfusion.  - Again had acute GI blood loss 11/2019 requiring 2 unit of PRBCS.  She also had syncope due to anemia.  Colonoscopy 11/30/19 suggestive of ischemic colitis.   This patients CHA2DS2-VASc Score and unadjusted Ischemic Stroke Rate (% per year) is equal to 7.2 % stroke rate/year from a score of 5  Above score calculated as 1 point each if present [CHF, HTN, DM, Vascular=MI/PAD/Aortic Plaque, Age if 65-74, or Female] Above score calculated as 2 points each if present [Age > 75, or Stroke/TIA/TE]  I have at length discussion regarding risk and benefits of anticoagulation.  Patient decided to restart Eliquis this Saturday on 9/25.  Follow hemoglobin very closely (has follow up with PCP tomorrow).  If recurrent bleeding, patient will consider stopping Eliquis and understands risk of stroke.   3. HTN -Elevated.  No change.  4. Chronic anemia in setting of CKD V -Per nephrology  5. DM -Per PCP    Medication Adjustments/Labs and Tests Ordered: Current medicines are reviewed at length with the patient today.  Concerns regarding medicines are outlined above.  Medication changes, Labs and Tests ordered today are listed in the Patient Instructions below. Patient Instructions  Medication Instructions:  Your physician recommends that you continue on your current medications as directed. Please refer to the Current Medication list given to you today.  *If you need a refill on your cardiac medications before your next appointment, please call your pharmacy*   Lab Work: None ordered  If you have labs (blood  work) drawn today and your tests are completely normal, you will receive your results only by: Marland Kitchen MyChart Message (if you have MyChart) OR . A paper copy in the mail If you have any lab test that is abnormal or we need to change your treatment, we will call you to review the results.   Testing/Procedures: None ordered   Follow-Up: At [facility], you and your health needs are our priority.  As part of our continuing mission to provide you with exceptional heart care, we have created designated Provider Care Teams.  These Care Teams include your primary Cardiologist (physician) and Advanced Practice Providers (APPs -  Physician Assistants and Nurse Practitioners) who all work together to provide you with the care you need, when you need it.  We recommend signing up for the patient portal called "MyChart".  Sign up information is provided on this After Visit Summary.  MyChart is used to connect with patients for Virtual Visits (Telemedicine).  Patients are able to view lab/test results, encounter notes, upcoming appointments, etc.  Non-urgent messages can be sent to your provider as well.   To learn more about what you can do with MyChart, go to [email or web address].    Your next appointment:   AS SCHEDULED    The format for your next appointment:     Provider:      Other Instructions      Weston Brass Leanor Kail, [place]  12/05/2019 2:57 PM    [facility] [address], [place], [place]  [address] Phone: [phone]; Fax: [phone]

## 2019-12-05 NOTE — Patient Instructions (Signed)
Medication Instructions:  Your physician recommends that you continue on your current medications as directed. Please refer to the Current Medication list given to you today.  *If you need a refill on your cardiac medications before your next appointment, please call your pharmacy*   Lab Work: None ordered  If you have labs (blood work) drawn today and your tests are completely normal, you will receive your results only by: Marland Kitchen MyChart Message (if you have MyChart) OR . A paper copy in the mail If you have any lab test that is abnormal or we need to change your treatment, we will call you to review the results.   Testing/Procedures: None ordered   Follow-Up: At [facility], you and your health needs are our priority.  As part of our continuing mission to provide you with exceptional heart care, we have created designated Provider Care Teams.  These Care Teams include your primary Cardiologist (physician) and Advanced Practice Providers (APPs -  Physician Assistants and Nurse Practitioners) who all work together to provide you with the care you need, when you need it.  We recommend signing up for the patient portal called "MyChart".  Sign up information is provided on this After Visit Summary.  MyChart is used to connect with patients for Virtual Visits (Telemedicine).  Patients are able to view lab/test results, encounter notes, upcoming appointments, etc.  Non-urgent messages can be sent to your provider as well.   To learn more about what you can do with MyChart, go to [email or web address].    Your next appointment:   AS SCHEDULED    The format for your next appointment:     Provider:      Other Instructions

## 2019-12-06 ENCOUNTER — Ambulatory Visit (INDEPENDENT_AMBULATORY_CARE_PROVIDER_SITE_OTHER): Payer: Medicare Other | Admitting: Family

## 2019-12-06 ENCOUNTER — Encounter: Payer: Self-pay | Admitting: Family

## 2019-12-06 VITALS — BP 148/82 | HR 62 | Temp 97.7°F | Ht 66.0 in | Wt 153.0 lb

## 2019-12-06 DIAGNOSIS — I4819 Other persistent atrial fibrillation: Secondary | ICD-10-CM

## 2019-12-06 DIAGNOSIS — Z23 Encounter for immunization: Secondary | ICD-10-CM | POA: Diagnosis not present

## 2019-12-06 DIAGNOSIS — D649 Anemia, unspecified: Secondary | ICD-10-CM | POA: Diagnosis not present

## 2019-12-06 DIAGNOSIS — Z8719 Personal history of other diseases of the digestive system: Secondary | ICD-10-CM | POA: Diagnosis not present

## 2019-12-06 LAB — CBC WITH DIFFERENTIAL/PLATELET
Absolute Monocytes: 612 cells/uL (ref 200–950)
Basophils Absolute: 41 cells/uL (ref 0–200)
Basophils Relative: 0.6 %
Eosinophils Absolute: 333 cells/uL (ref 15–500)
Eosinophils Relative: 4.9 %
HCT: 26.9 % — ABNORMAL LOW (ref 35.0–45.0)
Hemoglobin: 8.8 g/dL — ABNORMAL LOW (ref 11.7–15.5)
Lymphs Abs: 1707 cells/uL (ref 850–3900)
MCH: 32 pg (ref 27.0–33.0)
MCHC: 32.7 g/dL (ref 32.0–36.0)
MCV: 97.8 fL (ref 80.0–100.0)
MPV: 10.6 fL (ref 7.5–12.5)
Monocytes Relative: 9 %
Neutro Abs: 4107 cells/uL (ref 1500–7800)
Neutrophils Relative %: 60.4 %
Platelets: 257 10*3/uL (ref 140–400)
RBC: 2.75 10*6/uL — ABNORMAL LOW (ref 3.80–5.10)
RDW: 16.2 % — ABNORMAL HIGH (ref 11.0–15.0)
Total Lymphocyte: 25.1 %
WBC: 6.8 10*3/uL (ref 3.8–10.8)

## 2019-12-06 LAB — COMPREHENSIVE METABOLIC PANEL
AG Ratio: 1.5 (calc) (ref 1.0–2.5)
ALT: 13 U/L (ref 6–29)
AST: 16 U/L (ref 10–35)
Albumin: 3.4 g/dL — ABNORMAL LOW (ref 3.6–5.1)
Alkaline phosphatase (APISO): 119 U/L (ref 37–153)
BUN/Creatinine Ratio: 13 (calc) (ref 6–22)
BUN: 52 mg/dL — ABNORMAL HIGH (ref 7–25)
CO2: 24 mmol/L (ref 20–32)
Calcium: 10.2 mg/dL (ref 8.6–10.4)
Chloride: 109 mmol/L (ref 98–110)
Creat: 4.06 mg/dL — ABNORMAL HIGH (ref 0.60–0.88)
Globulin: 2.3 g/dL (calc) (ref 1.9–3.7)
Glucose, Bld: 91 mg/dL (ref 65–99)
Potassium: 4.6 mmol/L (ref 3.5–5.3)
Sodium: 143 mmol/L (ref 135–146)
Total Bilirubin: 0.4 mg/dL (ref 0.2–1.2)
Total Protein: 5.7 g/dL — ABNORMAL LOW (ref 6.1–8.1)

## 2019-12-06 MED ORDER — APIXABAN 2.5 MG PO TABS: 2.5000 mg | ORAL_TABLET | Freq: Two times a day (BID) | ORAL | 1 refills | Status: DC

## 2019-12-06 NOTE — Progress Notes (Signed)
Danielle Frazier is a 86 y.o. female with the following history as recorded in EpicCare:  Patient Active Problem List   Diagnosis Date Noted  . Anemia due to stage 5 chronic kidney disease ([place]) 11/28/2019  . Prolonged QT interval 11/28/2019  . DNR (do not resuscitate) 11/28/2019  . Deficiency anemia 06/11/2019  . Insomnia secondary to chronic pain 07/11/2018  . Persistent atrial fibrillation ([place]) 06/07/2018  . Lumbar radiculopathy 12/11/2017  . Greater trochanteric bursitis of right hip 11/07/2017  . Gastroesophageal reflux disease without esophagitis 07/06/2015  . Routine general medical examination at a health care facility 07/06/2015  . Constipation, chronic 04/24/2013  . Morbid obesity ([place]) 03/22/2013  . 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 Bilateral renal cysts 09/07/2011  . Hyperlipidemia with target LDL less than 100 12/30/2009  . DM (diabetes mellitus), type 2 with renal complications ([place]) 34/19/6222  . GOUT 08/25/2008  . Essential hypertension 08/25/2008  . CKD (chronic kidney disease) stage 5, GFR less than 15 ml/min (HCC) 08/25/2008  . OAB (overactive bladder) 08/25/2008    Current Outpatient Medications  Medication Sig Dispense Refill  . acetaminophen (TYLENOL) 500 MG tablet Take 1,000 mg by mouth as needed for mild pain or headache.     . allopurinol (ZYLOPRIM) 100 MG tablet Take 1 tablet (100 mg total) by mouth daily. 90 tablet 1  . [START ON 12/07/2019] apixaban (ELIQUIS) 2.5 MG TABS tablet Take 1 tablet (2.5 mg total) by mouth 2 (two) times daily. 60 tablet 1  . Blood Glucose Monitoring Suppl (BAYER CONTOUR MONITOR) W/DEVICE KIT 1 Act by Does not apply route 2 (two) times daily. 100 kit 0  . BYSTOLIC 10 MG tablet TAKE 1 TABLET BY MOUTH EVERY DAY 30 tablet 5  . Cholecalciferol (VITAMIN D) 50 MCG (2000 UT) tablet Take 2,000 Units by mouth daily.    . famotidine (PEPCID) 40 MG tablet Take 40 mg by mouth daily.    . fluticasone (FLONASE) 50 MCG/ACT nasal spray Place 2 sprays  into both nostrils as needed for allergies.    . furosemide (LASIX) 40 MG tablet Take 40 mg by mouth daily.    Marland Kitchen glucose blood (BAYER CONTOUR TEST) test strip Use BID 100 each 11  . LINZESS 145 MCG CAPS capsule TAKE 1 CAPSULE BY MOUTH EVERY DAY 90 capsule 1  . Multiple Vitamins-Minerals (CENTRUM SILVER PO) Take 1 tablet by mouth daily.       No current facility-administered medications for this visit.    Allergies: Metformin and Crestor [rosuvastatin]  Past Medical History:  Diagnosis Date  . Abdominal tenderness, RLQ (right lower quadrant) 04/24/2013  . Acute bacterial sinusitis 03/22/2013  . Acute bronchitis 03/24/2018   1/20  . Anemia associated with acute blood loss 06/04/2018  . Bilateral renal cysts 09/07/2011  . BRBPR (bright red blood per rectum) 06/04/2018  . Cerumen impaction 03/24/2018   1/20 L  . CKD (chronic kidney disease) stage 5, GFR less than 15 ml/min (HCC) 08/25/2008   Estimated Creatinine Clearance: 12.7 mL/min (A) (by C-G formula based on SCr of 3.58 mg/dL (H)).  . Constipation, chronic 04/24/2013  . Diabetes mellitus    type II  . 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 DM (diabetes mellitus), type 2 with renal complications ([place]) 9/79/8921  . ELECTROCARDIOGRAM, ABNORMAL 08/25/2008   Qualifier: Diagnosis of  By: Ronnald Ramp MD, Arvid Right.   . Essential hypertension 08/25/2008  . Gastroesophageal reflux disease without esophagitis 07/06/2015  . GI bleeding 06/07/2018  . Gout   .  GOUT 08/25/2008       . Greater trochanteric bursitis of right hip 11/07/2017   Injected November 07, 2017 repeat injection Jul 13, 2018 repeat injection September 20, 2018  . Hip pain, chronic, right 07/23/2012   Dg Hip Complete Right  07/23/2012   *RADIOLOGY REPORT*  Clinical Data: Right hip pain  RIGHT HIP - COMPLETE 2+ VIEW  Comparison: None.  Findings: No fracture or dislocation is seen.  Visualized bony pelvis appears intact.  Bilateral hip joint spaces are symmetric.  Degenerative changes of the lower lumbar spine.   IMPRESSION: No acute osseous abnormality is seen.   Original Report Authenticated By:   . Hyperlipidemia   . Hyperlipidemia with target LDL less than 100 12/30/2009  . Hypertension   . Insomnia secondary to chronic pain 07/11/2018  . LBP (low back pain)    foraminal narrowing at L4-5 and disc bulge at L5- S1 on MRI 6-08  . Lumbar radiculopathy 12/11/2017  . Morbid obesity ([place]) 03/22/2013  . Otitis media 03/24/2018   1/20 R and possibly L  . Persistent atrial fibrillation ([place]) 06/07/2018  . Renal insufficiency   . Right hip pain 10/16/2017  . Shoulder injury 05/21/2012  . Urinary incontinence   . Uterine fibroid 05/16/2013  . UTI (lower urinary tract infection) 04/24/2013    Past Surgical History:  Procedure Laterality Date  . COLONOSCOPY WITH PROPOFOL N/A 11/30/2019   Procedure: COLONOSCOPY WITH PROPOFOL;  Surgeon: [name], MD;  Location: [facility];  Service: Endoscopy;  Laterality: N/A;  . ESOPHAGOGASTRODUODENOSCOPY (EGD) WITH PROPOFOL N/A 06/08/2018   Procedure: ESOPHAGOGASTRODUODENOSCOPY (EGD) WITH PROPOFOL;  Surgeon: [name], MD;  Location: [facility];  Service: Gastroenterology;  Laterality: N/A;    Family History  Problem Relation Age of Onset  . Diabetes Other   . Hypertension Other     Social History   Tobacco Use  . Smoking status: Never Smoker  . Smokeless tobacco: Never Used  Substance Use Topics  . Alcohol use: No    Subjective:  Patient was hospitalized last week with GI bleed felt to be secondary to her Eliquis; required transfusion ( 2 units) but notes she is feeling so much better; has already met with cardiology yesterday and decision made to change Eliqus to 2.5 mg bid; her risk of stroke is greater than risk of Eliquis so will attempt to try lower dosage; needs to get her labs updated today to check hemoglobin and CMP; No other acute concerns today;   Objective:  Vitals:   12/06/19 0925  BP: (!) 148/82  Pulse: 62  Temp: 97.7 F (36.5  C)  TempSrc: Oral  SpO2: 99%  Weight: 153 lb (69.4 kg)  Height: '5\' 6"'  (1.676 m)    General: Well developed, well nourished, in no acute distress  Skin : Warm and dry.  Head: Normocephalic and atraumatic  Eyes: Sclera and conjunctiva clear; pupils round and reactive to light; extraocular movements intact  Lungs: Respirations unlabored; clear to auscultation bilaterally without wheeze, rales, rhonchi  CVS exam: Known A Fib  Neurologic: Alert and oriented; speech intact; face symmetrical; moves all extremities well; CNII-XII intact without focal deficit   Assessment:  1. History of GI bleed   2. Persistent atrial fibrillation ([place])   3. Anemia, unspecified type   4. Needs flu shot     Plan:  Appears well in office; reviewed notes from discharge summary and cardiology yesterday;  will check CBC, CMP today; reviewed cardiology notes and plan was  to lower dosage of Eliquis to 2.5 mg bid; samples and prescription updated; follow up to be determined based on labs today;   This visit occurred during the SARS-CoV-2 public health emergency.  Safety protocols were in place, including screening questions prior to the visit, additional usage of staff PPE, and extensive cleaning of exam room while observing appropriate contact time as indicated for disinfecting solutions.     No follow-ups on file.  Orders Placed This Encounter  Procedures  . Flu Vaccine QUAD High Dose(Fluad)  . CBC with Differential/Platelet    Standing Status:   Future    Number of Occurrences:   1    Standing Expiration Date:   12/05/2020  . Comp Met (CMET)    Standing Status:   Future    Number of Occurrences:   1    Standing Expiration Date:   12/05/2020    Requested Prescriptions   Signed Prescriptions Disp Refills  . apixaban (ELIQUIS) 2.5 MG TABS tablet 60 tablet 1    Sig: Take 1 tablet (2.5 mg total) by mouth 2 (two) times daily.

## 2019-12-13 ENCOUNTER — Other Ambulatory Visit: Payer: Self-pay | Admitting: Family

## 2019-12-13 DIAGNOSIS — D649 Anemia, unspecified: Secondary | ICD-10-CM

## 2019-12-18 ENCOUNTER — Other Ambulatory Visit: Payer: Medicare Other

## 2019-12-18 ENCOUNTER — Other Ambulatory Visit: Payer: Self-pay

## 2019-12-18 DIAGNOSIS — D649 Anemia, unspecified: Secondary | ICD-10-CM

## 2019-12-18 LAB — CBC WITH DIFFERENTIAL/PLATELET
Basophils Absolute: 0 10*3/uL (ref 0.0–0.1)
Basophils Relative: 0.7 % (ref 0.0–3.0)
Eosinophils Absolute: 0.3 10*3/uL (ref 0.0–0.7)
Eosinophils Relative: 4.5 % (ref 0.0–5.0)
HCT: 28.7 % — ABNORMAL LOW (ref 36.0–46.0)
Hemoglobin: 9.5 g/dL — ABNORMAL LOW (ref 12.0–15.0)
Lymphocytes Relative: 34.8 % (ref 12.0–46.0)
Lymphs Abs: 2.1 10*3/uL (ref 0.7–4.0)
MCHC: 33 g/dL (ref 30.0–36.0)
MCV: 99.4 fl (ref 78.0–100.0)
Monocytes Absolute: 0.5 10*3/uL (ref 0.1–1.0)
Monocytes Relative: 7.6 % (ref 3.0–12.0)
Neutro Abs: 3.2 10*3/uL (ref 1.4–7.7)
Neutrophils Relative %: 52.4 % (ref 43.0–77.0)
Platelets: 248 10*3/uL (ref 150.0–400.0)
RBC: 2.89 Mil/uL — ABNORMAL LOW (ref 3.87–5.11)
RDW: 20.1 % — ABNORMAL HIGH (ref 11.5–15.5)
WBC: 6.1 10*3/uL (ref 4.0–10.5)

## 2019-12-18 NOTE — Addendum Note (Signed)
Addended by: Jacob Moores on: 12/18/2019 09:47 AM   Modules accepted: Orders

## 2019-12-26 ENCOUNTER — Other Ambulatory Visit: Payer: Self-pay | Admitting: Internal Medicine

## 2020-01-06 ENCOUNTER — Other Ambulatory Visit: Payer: Self-pay | Admitting: Internal Medicine

## 2020-01-06 DIAGNOSIS — J301 Allergic rhinitis due to pollen: Secondary | ICD-10-CM

## 2020-01-06 MED ORDER — FLUTICASONE PROPIONATE 50 MCG/ACT NA SUSP: 2.0000 | NASAL | 3 refills | Status: DC | PRN

## 2020-01-20 ENCOUNTER — Encounter: Payer: Self-pay | Admitting: Internal Medicine

## 2020-01-20 ENCOUNTER — Ambulatory Visit (INDEPENDENT_AMBULATORY_CARE_PROVIDER_SITE_OTHER): Payer: Medicare Other

## 2020-01-20 ENCOUNTER — Other Ambulatory Visit: Payer: Self-pay

## 2020-01-20 ENCOUNTER — Ambulatory Visit (INDEPENDENT_AMBULATORY_CARE_PROVIDER_SITE_OTHER): Payer: Medicare Other | Admitting: Internal Medicine

## 2020-01-20 VITALS — BP 164/82 | HR 77 | Temp 98.9°F | Resp 16 | Ht 66.0 in | Wt 154.0 lb

## 2020-01-20 VITALS — BP 164/82 | HR 77 | Temp 98.9°F | Ht 66.0 in | Wt 154.0 lb

## 2020-01-20 DIAGNOSIS — I4819 Other persistent atrial fibrillation: Secondary | ICD-10-CM | POA: Diagnosis not present

## 2020-01-20 DIAGNOSIS — I10 Essential (primary) hypertension: Secondary | ICD-10-CM

## 2020-01-20 DIAGNOSIS — D631 Anemia in chronic kidney disease: Secondary | ICD-10-CM | POA: Diagnosis not present

## 2020-01-20 DIAGNOSIS — N185 Chronic kidney disease, stage 5: Secondary | ICD-10-CM | POA: Diagnosis not present

## 2020-01-20 DIAGNOSIS — Z0000 Encounter for general adult medical examination without abnormal findings: Secondary | ICD-10-CM

## 2020-01-20 LAB — CBC WITH DIFFERENTIAL/PLATELET
Basophils Absolute: 0 10*3/uL (ref 0.0–0.1)
Basophils Relative: 0.4 % (ref 0.0–3.0)
Eosinophils Absolute: 0.1 10*3/uL (ref 0.0–0.7)
Eosinophils Relative: 2.1 % (ref 0.0–5.0)
HCT: 35.6 % — ABNORMAL LOW (ref 36.0–46.0)
Hemoglobin: 11.5 g/dL — ABNORMAL LOW (ref 12.0–15.0)
Lymphocytes Relative: 23.6 % (ref 12.0–46.0)
Lymphs Abs: 1.7 10*3/uL (ref 0.7–4.0)
MCHC: 32.4 g/dL (ref 30.0–36.0)
MCV: 101.6 fl — ABNORMAL HIGH (ref 78.0–100.0)
Monocytes Absolute: 0.3 10*3/uL (ref 0.1–1.0)
Monocytes Relative: 4.8 % (ref 3.0–12.0)
Neutro Abs: 4.8 10*3/uL (ref 1.4–7.7)
Neutrophils Relative %: 69.1 % (ref 43.0–77.0)
Platelets: 204 10*3/uL (ref 150.0–400.0)
RBC: 3.51 Mil/uL — ABNORMAL LOW (ref 3.87–5.11)
RDW: 17.4 % — ABNORMAL HIGH (ref 11.5–15.5)
WBC: 7 10*3/uL (ref 4.0–10.5)

## 2020-01-20 LAB — TSH: TSH: 2.88 u[IU]/mL (ref 0.35–4.50)

## 2020-01-20 LAB — FERRITIN: Ferritin: 35.3 ng/mL (ref 10.0–291.0)

## 2020-01-20 LAB — IRON: Iron: 78 ug/dL (ref 42–145)

## 2020-01-20 NOTE — Patient Instructions (Signed)
Anemia  Anemia is a condition in which you do not have enough red blood cells or hemoglobin. Hemoglobin is a substance in red blood cells that carries oxygen. When you do not have enough red blood cells or hemoglobin (are anemic), your body cannot get enough oxygen and your organs may not work properly. As a result, you may feel very tired or have other problems. What are the causes? Common causes of anemia include:  Excessive bleeding. Anemia can be caused by excessive bleeding inside or outside the body, including bleeding from the intestine or from periods in women.  Poor nutrition.  Long-lasting (chronic) kidney, thyroid, and liver disease.  Bone marrow disorders.  Cancer and treatments for cancer.  HIV (human immunodeficiency virus) and AIDS (acquired immunodeficiency syndrome).  Treatments for HIV and AIDS.  Spleen problems.  Blood disorders.  Infections, medicines, and autoimmune disorders that destroy red blood cells. What are the signs or symptoms? Symptoms of this condition include:  Minor weakness.  Dizziness.  Headache.  Feeling heartbeats that are irregular or faster than normal (palpitations).  Shortness of breath, especially with exercise.  Paleness.  Cold sensitivity.  Indigestion.  Nausea.  Difficulty sleeping.  Difficulty concentrating. Symptoms may occur suddenly or develop slowly. If your anemia is mild, you may not have symptoms. How is this diagnosed? This condition is diagnosed based on:  Blood tests.  Your medical history.  A physical exam.  Bone marrow biopsy. Your health care provider may also check your stool (feces) for blood and may do additional testing to look for the cause of your bleeding. You may also have other tests, including:  Imaging tests, such as a CT scan or MRI.  Endoscopy.  Colonoscopy. How is this treated? Treatment for this condition depends on the cause. If you continue to lose a lot of blood, you may  need to be treated at a hospital. Treatment may include:  Taking supplements of iron, vitamin S31, or folic acid.  Taking a hormone medicine (erythropoietin) that can help to stimulate red blood cell growth.  Having a blood transfusion. This may be needed if you lose a lot of blood.  Making changes to your diet.  Having surgery to remove your spleen. Follow these instructions at home:  Take over-the-counter and prescription medicines only as told by your health care provider.  Take supplements only as told by your health care provider.  Follow any diet instructions that you were given.  Keep all follow-up visits as told by your health care provider. This is important. Contact a health care provider if:  You develop new bleeding anywhere in the body. Get help right away if:  You are very weak.  You are short of breath.  You have pain in your abdomen or chest.  You are dizzy or feel faint.  You have trouble concentrating.  You have bloody or black, tarry stools.  You vomit repeatedly or you vomit up blood. Summary  Anemia is a condition in which you do not have enough red blood cells or enough of a substance in your red blood cells that carries oxygen (hemoglobin).  Symptoms may occur suddenly or develop slowly.  If your anemia is mild, you may not have symptoms.  This condition is diagnosed with blood tests as well as a medical history and physical exam. Other tests may be needed.  Treatment for this condition depends on the cause of the anemia. This information is not intended to replace advice given to you by  your health care provider. Make sure you discuss any questions you have with your health care provider. Document Revised: 02/10/2017 Document Reviewed: 04/01/2016 Elsevier Patient Education  [place].

## 2020-01-20 NOTE — Progress Notes (Signed)
Subjective:  Patient ID: Danielle Frazier, female    DOB: 08-10-1930  Age: 86 y.o. MRN: [id number]  CC: Anemia and Atrial Fibrillation  This visit occurred during the SARS-CoV-2 public health emergency.  Safety protocols were in place, including screening questions prior to the visit, additional usage of staff PPE, and extensive cleaning of exam room while observing appropriate contact time as indicated for disinfecting solutions.    HPI DONNIA POPLASKI presents for f/up - About a month ago she was admitted for another lower GI bleed.  She has decided to stop taking the DOAC.  She has had no more bleeding since discharge.  She feels cold at times but denies shortness of breath, abdominal pain, palpitations, nausea, vomiting, edema, dizziness, or lightheadedness.  Outpatient Medications Prior to Visit  Medication Sig Dispense Refill  . acetaminophen (TYLENOL) 500 MG tablet Take 1,000 mg by mouth as needed for mild pain or headache.     . allopurinol (ZYLOPRIM) 100 MG tablet Take 1 tablet (100 mg total) by mouth daily. 90 tablet 1  . Blood Glucose Monitoring Suppl (BAYER CONTOUR MONITOR) W/DEVICE KIT 1 Act by Does not apply route 2 (two) times daily. 100 kit 0  . Cholecalciferol (VITAMIN D) 50 MCG (2000 UT) tablet Take 2,000 Units by mouth daily.    . famotidine (PEPCID) 40 MG tablet Take 40 mg by mouth daily.    . fluticasone (FLONASE) 50 MCG/ACT nasal spray Place 2 sprays into both nostrils as needed for allergies. 48 g 3  . furosemide (LASIX) 40 MG tablet Take 40 mg by mouth daily.    Marland Kitchen glucose blood (BAYER CONTOUR TEST) test strip Use BID 100 each 11  . LINZESS 145 MCG CAPS capsule TAKE 1 CAPSULE BY MOUTH EVERY DAY 90 capsule 1  . Multiple Vitamins-Minerals (CENTRUM SILVER PO) Take 1 tablet by mouth daily.      . nebivolol (BYSTOLIC) 10 MG tablet Take 10 mg by mouth daily.    Marland Kitchen apixaban (ELIQUIS) 2.5 MG TABS tablet Take 1 tablet (2.5 mg total) by mouth 2 (two) times daily. 60 tablet 1  .  BYSTOLIC 10 MG tablet TAKE 1 TABLET BY MOUTH EVERY DAY 30 tablet 5   No facility-administered medications prior to visit.    ROS Review of Systems  Constitutional: Negative for diaphoresis, fatigue and unexpected weight change.  HENT: Negative.   Respiratory: Negative for cough, chest tightness, shortness of breath and wheezing.   Cardiovascular: Negative for chest pain, palpitations and leg swelling.  Gastrointestinal: Negative for abdominal pain, blood in stool, constipation, diarrhea, nausea and vomiting.  Endocrine: Positive for cold intolerance. Negative for heat intolerance.  Genitourinary: Negative.  Negative for difficulty urinating and dysuria.  Musculoskeletal: Negative.  Negative for arthralgias, back pain, myalgias and neck pain.  Skin: Negative for color change and pallor.  Neurological: Negative.  Negative for dizziness, weakness, light-headedness and headaches.  Hematological: Negative for adenopathy. Does not bruise/bleed easily.  Psychiatric/Behavioral: Negative.     Objective:  BP (!) 164/82   Pulse 77   Temp 98.9 F (37.2 C) (Oral)   Resp 16   Ht '5\' 6"'  (1.676 m)   Wt 154 lb (69.9 kg)   SpO2 99%   BMI 24.86 kg/m   BP Readings from Last 3 Encounters:  01/20/20 (!) 164/82  01/20/20 (!) 164/82  12/06/19 (!) 148/82    Wt Readings from Last 3 Encounters:  01/20/20 154 lb (69.9 kg)  01/20/20 154 lb (69.9 kg)  12/06/19 153 lb (69.4 kg)    Physical Exam Vitals reviewed.  HENT:     Nose: Nose normal.  Eyes:     General: No scleral icterus.    Conjunctiva/sclera: Conjunctivae normal.  Cardiovascular:     Rate and Rhythm: Normal rate. Rhythm irregularly irregular.     Heart sounds: No murmur heard.   Pulmonary:     Effort: Pulmonary effort is normal.     Breath sounds: No stridor. No wheezing, rhonchi or rales.  Abdominal:     General: Abdomen is flat. Bowel sounds are normal. There is no distension.     Palpations: Abdomen is soft. There is no  hepatomegaly, splenomegaly or mass.     Tenderness: There is no abdominal tenderness.  Musculoskeletal:        General: Normal range of motion.     Cervical back: Neck supple.     Right lower leg: No edema.     Left lower leg: No edema.  Skin:    General: Skin is warm and dry.     Coloration: Skin is not pale.  Neurological:     General: No focal deficit present.     Mental Status: She is alert.  Psychiatric:        Mood and Affect: Mood normal.        Behavior: Behavior normal.     Lab Results  Component Value Date   WBC 7.0 01/20/2020   HGB 11.5 (L) 01/20/2020   HCT 35.6 (L) 01/20/2020   PLT 204.0 01/20/2020   GLUCOSE 91 12/06/2019   CHOL 154 11/12/2018   TRIG 123.0 11/12/2018   HDL 52.20 11/12/2018   LDLDIRECT 90.0 07/06/2015   LDLCALC 78 11/12/2018   ALT 13 12/06/2019   AST 16 12/06/2019   NA 143 12/06/2019   K 4.6 12/06/2019   CL 109 12/06/2019   CREATININE 4.06 (H) 12/06/2019   BUN 52 (H) 12/06/2019   CO2 24 12/06/2019   TSH 2.88 01/20/2020   INR 1.2 11/28/2019   HGBA1C 5.5 06/11/2019   MICROALBUR 133.0 (H) 06/11/2019    No results found.  Assessment & Plan:   Cyrilla was seen today for anemia and atrial fibrillation.  Diagnoses and all orders for this visit:  Essential hypertension- Considering her age and comorbid illnesses her blood pressure is adequately well controlled.  Will permit mild hypertension. -     TSH; Future -     TSH  Persistent atrial fibrillation ([place])- She remains in atrial fibrillation.  Anticoagulation is contraindicated. -     TSH; Future -     TSH  Anemia due to stage 5 chronic kidney disease ([place])- Her H&H have improved since discharge and her iron level is normal. -     CBC with Differential/Platelet; Future -     Iron; Future -     Ferritin; Future -     Ferritin -     Iron -     CBC with Differential/Platelet   I have discontinued [facility] Bystolic and apixaban. I am also having her maintain her Multiple  Vitamins-Minerals (CENTRUM SILVER PO), Bayer Contour Monitor, glucose blood, Vitamin D, Linzess, famotidine, furosemide, allopurinol, acetaminophen, fluticasone, and nebivolol.  No orders of the defined types were placed in this encounter.    Follow-up: Return in about 3 months (around 04/21/2020).  [name], MD

## 2020-01-20 NOTE — Progress Notes (Signed)
Subjective:   Danielle Frazier is a 86 y.o. female who presents for Medicare Annual (Subsequent) preventive examination.  Review of Systems    No ROS. Medicare Wellness Visit. Cardiac Risk Factors include: advanced age (>23mn, >>63women);diabetes mellitus;dyslipidemia;hypertension     Objective:    Today's Vitals   01/20/20 1141  BP: (!) 164/82  Pulse: 77  Temp: 98.9 F (37.2 C)  SpO2: 99%  Weight: 154 lb (69.9 kg)  Height: '5\' 6"'  (1.676 m)  PainSc: 0-No pain   Body mass index is 24.86 kg/m.  Advanced Directives 01/20/2020 11/28/2019 11/28/2019 06/07/2018 06/07/2018 07/06/2015 07/06/2015  Does Patient Have a Medical Advance Directive? No - No No No Yes Yes  Type of Advance Directive - - - - - HPress photographerLiving will -  Does patient want to make changes to medical advance directive? - - - - - No - Patient declined No - Patient declined  Copy of HWindsorin Chart? - - - - - Yes -  Would patient like information on creating a medical advance directive? No - Patient declined No - Patient declined - No - Patient declined - - -    Current Medications (verified) Outpatient Encounter Medications as of 01/20/2020  Medication Sig  . acetaminophen (TYLENOL) 500 MG tablet Take 1,000 mg by mouth as needed for mild pain or headache.   . allopurinol (ZYLOPRIM) 100 MG tablet Take 1 tablet (100 mg total) by mouth daily.  . Blood Glucose Monitoring Suppl (BAYER CONTOUR MONITOR) W/DEVICE KIT 1 Act by Does not apply route 2 (two) times daily.  . Cholecalciferol (VITAMIN D) 50 MCG (2000 UT) tablet Take 2,000 Units by mouth daily.  . famotidine (PEPCID) 40 MG tablet Take 40 mg by mouth daily.  . fluticasone (FLONASE) 50 MCG/ACT nasal spray Place 2 sprays into both nostrils as needed for allergies.  . furosemide (LASIX) 40 MG tablet Take 40 mg by mouth daily.  .Marland Kitchenglucose blood (BAYER CONTOUR TEST) test strip Use BID  . LINZESS 145 MCG CAPS capsule TAKE 1 CAPSULE BY  MOUTH EVERY DAY  . Multiple Vitamins-Minerals (CENTRUM SILVER PO) Take 1 tablet by mouth daily.    . [DISCONTINUED] nebivolol (BYSTOLIC) 10 MG tablet Take 10 mg by mouth 2 (two) times daily.   No facility-administered encounter medications on file as of 01/20/2020.    Allergies (verified) Metformin and Crestor [rosuvastatin]   History: Past Medical History:  Diagnosis Date  . Abdominal tenderness, RLQ (right lower quadrant) 04/24/2013  . Acute bacterial sinusitis 03/22/2013  . Acute bronchitis 03/24/2018   1/20  . Anemia associated with acute blood loss 06/04/2018  . Bilateral renal cysts 09/07/2011  . BRBPR (bright red blood per rectum) 06/04/2018  . Cerumen impaction 03/24/2018   1/20 L  . CKD (chronic kidney disease) stage 5, GFR less than 15 ml/min (HCC) 08/25/2008   Estimated Creatinine Clearance: 12.7 mL/min (A) (by C-G formula based on SCr of 3.58 mg/dL (H)).  . Constipation, chronic 04/24/2013  . Diabetes mellitus    type II  . 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 DM (diabetes mellitus), type 2 with renal complications ([place] [id number]/4332 . ELECTROCARDIOGRAM, ABNORMAL 08/25/2008   Qualifier: Diagnosis of  By: JRonnald RampMD, TArvid Right   . Essential hypertension 08/25/2008  . Gastroesophageal reflux disease without esophagitis 07/06/2015  . GI bleeding 06/07/2018  . Gout   . GOUT 08/25/2008       . Greater trochanteric bursitis of  right hip 11/07/2017   Injected November 07, 2017 repeat injection Jul 13, 2018 repeat injection September 20, 2018  . Hip pain, chronic, right 07/23/2012   Dg Hip Complete Right  07/23/2012   *RADIOLOGY REPORT*  Clinical Data: Right hip pain  RIGHT HIP - COMPLETE 2+ VIEW  Comparison: None.  Findings: No fracture or dislocation is seen.  Visualized bony pelvis appears intact.  Bilateral hip joint spaces are symmetric.  Degenerative changes of the lower lumbar spine.  IMPRESSION: No acute osseous abnormality is seen.   Original Report Authenticated By:   . Hyperlipidemia   .  Hyperlipidemia with target LDL less than 100 12/30/2009  . Hypertension   . Insomnia secondary to chronic pain 07/11/2018  . LBP (low back pain)    foraminal narrowing at L4-5 and disc bulge at L5- S1 on MRI 6-08  . Lumbar radiculopathy 12/11/2017  . Morbid obesity ([place]) 03/22/2013  . Otitis media 03/24/2018   1/20 R and possibly L  . Persistent atrial fibrillation ([place]) 06/07/2018  . Renal insufficiency   . Right hip pain 10/16/2017  . Shoulder injury 05/21/2012  . Urinary incontinence   . Uterine fibroid 05/16/2013  . UTI (lower urinary tract infection) 04/24/2013   Past Surgical History:  Procedure Laterality Date  . COLONOSCOPY WITH PROPOFOL N/A 11/30/2019   Procedure: COLONOSCOPY WITH PROPOFOL;  Surgeon: [name], MD;  Location: [facility];  Service: Endoscopy;  Laterality: N/A;  . ESOPHAGOGASTRODUODENOSCOPY (EGD) WITH PROPOFOL N/A 06/08/2018   Procedure: ESOPHAGOGASTRODUODENOSCOPY (EGD) WITH PROPOFOL;  Surgeon: [name], MD;  Location: [facility];  Service: Gastroenterology;  Laterality: N/A;   Family History  Problem Relation Age of Onset  . Diabetes Other   . Hypertension Other    Social History   Socioeconomic History  . Marital status: Married    Spouse name: Not on file  . Number of children: Not on file  . Years of education: Not on file  . Highest education level: Not on file  Occupational History  . Not on file  Tobacco Use  . Smoking status: Never Smoker  . Smokeless tobacco: Never Used  Vaping Use  . Vaping Use: Never used  Substance and Sexual Activity  . Alcohol use: No  . Drug use: No  . Sexual activity: Not Currently  Other Topics Concern  . Not on file  Social History Narrative  . Not on file   Social Determinants of Health   Financial Resource Strain: Low Risk   . Difficulty of Paying Living Expenses: Not hard at all  Food Insecurity: No Food Insecurity  . Worried About Charity fundraiser in the Last Year: Never true  . Ran  Out of Food in the Last Year: Never true  Transportation Needs: No Transportation Needs  . Lack of Transportation (Medical): No  . Lack of Transportation (Non-Medical): No  Physical Activity: Inactive  . Days of Exercise per Week: 0 days  . Minutes of Exercise per Session: 0 min  Stress: No Stress Concern Present  . Feeling of Stress : Not at all  Social Connections:   . Frequency of Communication with Friends and Family: Not on file  . Frequency of Social Gatherings with Friends and Family: Not on file  . Attends Religious Services: Not on file  . Active Member of Clubs or Organizations: Not on file  . Attends Archivist Meetings: Not on file  . Marital Status: Not on file    Tobacco Counseling  Counseling given: Not Answered   Clinical Intake:  Pre-visit preparation completed: Yes  Pain Score: 0-No pain     BMI - recorded: 24.86 Nutritional Status: BMI of 19-24  Normal Nutritional Risks: None Diabetes: Yes CBG done?: No Did pt. bring in CBG monitor from home?: No  How often do you need to have someone help you when you read instructions, pamphlets, or other written materials from your doctor or pharmacy?: 1 - Never What is the last grade level you completed in school?: HSG  Diabetic? yes  Interpreter Needed?: No  Information entered by :: Tuvia Woodrick N. Marlane Hirschmann, LPN   Activities of Daily Living In your present state of health, do you have any difficulty performing the following activities: 01/20/2020 11/28/2019  Hearing? N N  Vision? N N  Difficulty concentrating or making decisions? N N  Walking or climbing stairs? Y Y  Dressing or bathing? N N  Doing errands, shopping? N N  Preparing Food and eating ? N -  Using the Toilet? N -  In the past six months, have you accidently leaked urine? Y -  Comment wears a pad for protection -  Do you have problems with loss of bowel control? N -  Managing your Medications? N -  Managing your Finances? N -    Housekeeping or managing your Housekeeping? N -  Some recent data might be hidden    Patient Care Team: [name], MD as PCP - General Nahser, Wonda Cheng, MD as PCP - Cardiology (Cardiology)  Indicate any recent Medical Services you may have received from other than [facility] providers in the past year (date may be approximate).     Assessment:   This is a routine wellness examination for Shadaya.  Hearing/Vision screen No exam data present  Dietary issues and exercise activities discussed: Current Exercise Habits: The patient does not participate in regular exercise at present, Exercise limited by: orthopedic condition(s);cardiac condition(s)  Goals    . Exercise 150 minutes per week (moderate activity)     To do short, interval walks To continue to do resistance exercise with stretch bands as directed by PT.       Depression Screen PHQ 2/9 Scores 01/20/2020 12/06/2019 11/12/2018 07/05/2017 07/05/2016 07/06/2015 07/06/2015  PHQ - 2 Score 0 0 0 0 0 0 0    Fall Risk Fall Risk  01/20/2020 09/11/2019 11/12/2018 07/05/2017 07/05/2016  Falls in the past year? 1 1 0 No Yes  Number falls in past yr: 0 1 0 - 2 or more  Injury with Fall? 1 1 0 - Yes  Risk for fall due to : Impaired balance/gait;Orthopedic patient Impaired mobility;Impaired vision Impaired mobility - -  Follow up Falls evaluation completed Falls evaluation completed Falls evaluation completed;Education provided - -    Any stairs in or around the home? No  If so, are there any without handrails? No  Home free of loose throw rugs in walkways, pet beds, electrical cords, etc? Yes  Adequate lighting in your home to reduce risk of falls? Yes   ASSISTIVE DEVICES UTILIZED TO PREVENT FALLS:  Life alert? No  Use of a cane, walker or w/c? Yes  Grab bars in the bathroom? No  Shower chair or bench in shower? Yes  Elevated toilet seat or a handicapped toilet? No   TIMED UP AND GO:  Was the test performed? No .  Length of time to  ambulate 10 feet: 0 sec.   Gait steady and fast with assistive  device  Cognitive Function: MMSE - Mini Mental State Exam 07/06/2015  Not completed: (No Data)     6CIT Screen 01/20/2020  What Year? 0 points  What month? 0 points  What time? 3 points  Count back from 20 0 points  Months in reverse 0 points  Repeat phrase 0 points  Total Score 3    Immunizations Immunization History  Administered Date(s) Administered  . Fluad Quad(high Dose 65+) 12/12/2018, 12/06/2019  . Influenza Split 01/04/2012  . Influenza, High Dose Seasonal PF 01/01/2015, 01/05/2016, 01/04/2017, 01/08/2018  . Influenza,inj,Quad PF,6+ Mos 12/31/2013  . Influenza-Unspecified 12/12/2012  . Pneumococcal Conjugate-13 08/20/2013  . Pneumococcal Polysaccharide-23 08/25/2008, 07/06/2015  . Td 08/25/2008  . Tdap 11/12/2018    TDAP status: Up to date Flu Vaccine status: Up to date Pneumococcal vaccine status: Up to date Covid-19 vaccine status: Completed vaccines  Qualifies for Shingles Vaccine? Yes   Zostavax completed No   Shingrix Completed?: No.    Education has been provided regarding the importance of this vaccine. Patient has been advised to call insurance company to determine out of pocket expense if they have not yet received this vaccine. Advised may also receive vaccine at local pharmacy or Health Dept. Verbalized acceptance and understanding.  Screening Tests Health Maintenance  Topic Date Due  . COVID-19 Vaccine (1) Never done  . OPHTHALMOLOGY EXAM  03/23/2019  . FOOT EXAM  07/11/2019  . HEMOGLOBIN A1C  12/12/2019  . URINE MICROALBUMIN  06/10/2020  . TETANUS/TDAP  11/11/2028  . INFLUENZA VACCINE  Completed  . DEXA SCAN  Completed  . PNA vac Low Risk Adult  Completed    Health Maintenance  Health Maintenance Due  Topic Date Due  . COVID-19 Vaccine (1) Never done  . OPHTHALMOLOGY EXAM  03/23/2019  . FOOT EXAM  07/11/2019  . HEMOGLOBIN A1C  12/12/2019    Colorectal cancer screening:  Completed 11/30/2019. Repeat every 0 years Mammogram status: No longer required.   Lung Cancer Screening: (Low Dose CT Chest recommended if Age 43-80 years, 30 pack-year currently smoking OR have quit w/in 15years.) does not qualify.   Lung Cancer Screening Referral: no  Additional Screening:  Hepatitis C Screening: does not qualify; Completed no  Vision Screening: Recommended annual ophthalmology exams for early detection of glaucoma and other disorders of the eye. Is the patient up to date with their annual eye exam?  Yes  Who is the provider or what is the name of the office in which the patient attends annual eye exams? [facility] If pt is not established with a provider, would they like to be referred to a provider to establish care? No .   Dental Screening: Recommended annual dental exams for proper oral hygiene  Community Resource Referral / Chronic Care Management: CRR required this visit?  No   CCM required this visit?  No      Plan:     I have personally reviewed and noted the following in the patient's chart:   . Medical and social history . Use of alcohol, tobacco or illicit drugs  . Current medications and supplements . Functional ability and status . Nutritional status . Physical activity . Advanced directives . List of other physicians . Hospitalizations, surgeries, and ER visits in previous 12 months . Vitals . Screenings to include cognitive, depression, and falls . Referrals and appointments  In addition, I have reviewed and discussed with patient certain preventive protocols, quality metrics, and best practice recommendations. A written personalized care plan  for preventive services as well as general preventive health recommendations were provided to patient.     Sheral Flow, LPN   [id number]   Nurse Notes: n/a

## 2020-01-20 NOTE — Patient Instructions (Signed)
[name] , Thank you for taking time to come for your Medicare Wellness Visit. I appreciate your ongoing commitment to your health goals. Please review the following plan we discussed and let me know if I can assist you in the future.   Screening recommendations/referrals: Colonoscopy: 11/30/2019 Mammogram: 01/27/2016 Bone Density: 05/06/2011 Recommended yearly ophthalmology/optometry visit for glaucoma screening and checkup Recommended yearly dental visit for hygiene and checkup  Vaccinations: Influenza vaccine: 12/06/2019 Pneumococcal vaccine: up to date Tdap vaccine: 11/12/2018 Shingles vaccine: never done   Covid-19: up to date  Advanced directives: Advance directive discussed with you today. Even though you declined this today please call our office should you change your mind and we can give you the proper paperwork for you to fill out.  Conditions/risks identified: Yes. Reviewed health maintenance screenings with patient today and relevant education, vaccines, and/or referrals were provided. Continue doing brain stimulating activities (puzzles, reading, adult coloring books, staying active) to keep memory sharp. Continue to eat heart healthy diet (full of fruits, vegetables, whole grains, lean protein, water--limit salt, fat, and sugar intake) and increase physical activity as tolerated.  Next appointment: Please schedule your next Medicare Wellness Visit with your Nurse Health Advisor in 1 year by calling [phone].   Preventive Care 4 Years and Older, Female Preventive care refers to lifestyle choices and visits with your health care provider that can promote health and wellness. What does preventive care include?  A yearly physical exam. This is also called an annual well check.  Dental exams once or twice a year.  Routine eye exams. Ask your health care provider how often you should have your eyes checked.  Personal lifestyle choices, including:  Daily care of your  teeth and gums.  Regular physical activity.  Eating a healthy diet.  Avoiding tobacco and drug use.  Limiting alcohol use.  Practicing safe sex.  Taking low-dose aspirin every day.  Taking vitamin and mineral supplements as recommended by your health care provider. What happens during an annual well check? The services and screenings done by your health care provider during your annual well check will depend on your age, overall health, lifestyle risk factors, and family history of disease. Counseling  Your health care provider may ask you questions about your:  Alcohol use.  Tobacco use.  Drug use.  Emotional well-being.  Home and relationship well-being.  Sexual activity.  Eating habits.  History of falls.  Memory and ability to understand (cognition).  Work and work Statistician.  Reproductive health. Screening  You may have the following tests or measurements:  Height, weight, and BMI.  Blood pressure.  Lipid and cholesterol levels. These may be checked every 5 years, or more frequently if you are over 64 years old.  Skin check.  Lung cancer screening. You may have this screening every year starting at age 2 if you have a 30-pack-year history of smoking and currently smoke or have quit within the past 15 years.  Fecal occult blood test (FOBT) of the stool. You may have this test every year starting at age 42.  Flexible sigmoidoscopy or colonoscopy. You may have a sigmoidoscopy every 5 years or a colonoscopy every 10 years starting at age 53.  Hepatitis C blood test.  Hepatitis B blood test.  Sexually transmitted disease (STD) testing.  Diabetes screening. This is done by checking your blood sugar (glucose) after you have not eaten for a while (fasting). You may have this done every 1-3 years.  Bone density scan.  This is done to screen for osteoporosis. You may have this done starting at age 42.  Mammogram. This may be done every 1-2 years. Talk  to your health care provider about how often you should have regular mammograms. Talk with your health care provider about your test results, treatment options, and if necessary, the need for more tests. Vaccines  Your health care provider may recommend certain vaccines, such as:  Influenza vaccine. This is recommended every year.  Tetanus, diphtheria, and acellular pertussis (Tdap, Td) vaccine. You may need a Td booster every 10 years.  Zoster vaccine. You may need this after age 2.  Pneumococcal 13-valent conjugate (PCV13) vaccine. One dose is recommended after age 59.  Pneumococcal polysaccharide (PPSV23) vaccine. One dose is recommended after age 44. Talk to your health care provider about which screenings and vaccines you need and how often you need them. This information is not intended to replace advice given to you by your health care provider. Make sure you discuss any questions you have with your health care provider. Document Released: 03/27/2015 Document Revised: 11/18/2015 Document Reviewed: 12/30/2014 Elsevier Interactive Patient Education  2017 [place] Prevention in the Home Falls can cause injuries. They can happen to people of all ages. There are many things you can do to make your home safe and to help prevent falls. What can I do on the outside of my home?  Regularly fix the edges of walkways and driveways and fix any cracks.  Remove anything that might make you trip as you walk through a door, such as a raised step or threshold.  Trim any bushes or trees on the path to your home.  Use bright outdoor lighting.  Clear any walking paths of anything that might make someone trip, such as rocks or tools.  Regularly check to see if handrails are loose or broken. Make sure that both sides of any steps have handrails.  Any raised decks and porches should have guardrails on the edges.  Have any leaves, snow, or ice cleared regularly.  Use sand or salt on  walking paths during winter.  Clean up any spills in your garage right away. This includes oil or grease spills. What can I do in the bathroom?  Use night lights.  Install grab bars by the toilet and in the tub and shower. Do not use towel bars as grab bars.  Use non-skid mats or decals in the tub or shower.  If you need to sit down in the shower, use a plastic, non-slip stool.  Keep the floor dry. Clean up any water that spills on the floor as soon as it happens.  Remove soap buildup in the tub or shower regularly.  Attach bath mats securely with double-sided non-slip rug tape.  Do not have throw rugs and other things on the floor that can make you trip. What can I do in the bedroom?  Use night lights.  Make sure that you have a light by your bed that is easy to reach.  Do not use any sheets or blankets that are too big for your bed. They should not hang down onto the floor.  Have a firm chair that has side arms. You can use this for support while you get dressed.  Do not have throw rugs and other things on the floor that can make you trip. What can I do in the kitchen?  Clean up any spills right away.  Avoid walking on wet floors.  Keep items that you use a lot in easy-to-reach places.  If you need to reach something above you, use a strong step stool that has a grab bar.  Keep electrical cords out of the way.  Do not use floor polish or wax that makes floors slippery. If you must use wax, use non-skid floor wax.  Do not have throw rugs and other things on the floor that can make you trip. What can I do with my stairs?  Do not leave any items on the stairs.  Make sure that there are handrails on both sides of the stairs and use them. Fix handrails that are broken or loose. Make sure that handrails are as long as the stairways.  Check any carpeting to make sure that it is firmly attached to the stairs. Fix any carpet that is loose or worn.  Avoid having throw  rugs at the top or bottom of the stairs. If you do have throw rugs, attach them to the floor with carpet tape.  Make sure that you have a light switch at the top of the stairs and the bottom of the stairs. If you do not have them, ask someone to add them for you. What else can I do to help prevent falls?  Wear shoes that:  Do not have high heels.  Have rubber bottoms.  Are comfortable and fit you well.  Are closed at the toe. Do not wear sandals.  If you use a stepladder:  Make sure that it is fully opened. Do not climb a closed stepladder.  Make sure that both sides of the stepladder are locked into place.  Ask someone to hold it for you, if possible.  Clearly mark and make sure that you can see:  Any grab bars or handrails.  First and last steps.  Where the edge of each step is.  Use tools that help you move around (mobility aids) if they are needed. These include:  Canes.  Walkers.  Scooters.  Crutches.  Turn on the lights when you go into a dark area. Replace any light bulbs as soon as they burn out.  Set up your furniture so you have a clear path. Avoid moving your furniture around.  If any of your floors are uneven, fix them.  If there are any pets around you, be aware of where they are.  Review your medicines with your doctor. Some medicines can make you feel dizzy. This can increase your chance of falling. Ask your doctor what other things that you can do to help prevent falls. This information is not intended to replace advice given to you by your health care provider. Make sure you discuss any questions you have with your health care provider. Document Released: 12/25/2008 Document Revised: 08/06/2015 Document Reviewed: 04/04/2014 Elsevier Interactive Patient Education  2017 [employer].

## 2020-01-21 ENCOUNTER — Encounter: Payer: Self-pay | Admitting: Internal Medicine

## 2020-01-23 DIAGNOSIS — I120 Hypertensive chronic kidney disease with stage 5 chronic kidney disease or end stage renal disease: Secondary | ICD-10-CM | POA: Diagnosis not present

## 2020-01-23 DIAGNOSIS — M109 Gout, unspecified: Secondary | ICD-10-CM | POA: Diagnosis not present

## 2020-01-23 DIAGNOSIS — N185 Chronic kidney disease, stage 5: Secondary | ICD-10-CM | POA: Diagnosis not present

## 2020-02-17 NOTE — Progress Notes (Signed)
Cardiology Office Note:    Date:  02/18/2020   ID:  Danielle Frazier, DOB 28-Aug-1930, MRN [id number]  PCP:  [name], MD  [facility] Cardiologist:  Mertie Moores, MD   [facility] Electrophysiologist:  None   Referring MD: [name], MD   Chief Complaint:  Follow-up (Atrial fibrillation, CHF, hypertension)    Patient Profile:    Danielle Frazier is a 86 y.o. female with:   Persistent atrial fibrillation  No longer on anticoagulation due to GI bleeding  Heart failure with reduced ejection fraction   Echocardiogram 11/2018: EF 40   Hypertension   Diabetes mellitus   Hyperlipidemia   Chronic kidney disease V  Morbid obesity   Hx of GI bleed in 05/2018  s/p transfusion with PRBCs  Hx of LGI bleed 2/2 ischemic colitis in 9/21   Hx of gout   Prior CV studies: Echocardiogram 12/03/2018 EF 40, mild LVH, diff HK, mod MR, mild TR, trivial AI, mild AV sclerosis (no AS), severe LAE, mild to mod RAE, RVSP 50.3  Myoview 08/2008 EF 61, normal study  History of Present Illness:    Danielle Frazier was last seen in clinic by Leanor Kail, PA-C in 11/2019.  She returns for follow-up.  Since last seen, she has stopped taking Apixaban.  She has not had recurrent GI bleeding since that time.  She has not had chest pain.  She has not had any significant changes in her shortness of breath.  She has not had orthopnea, significant leg swelling or syncope.         Past Medical History:  Diagnosis Date  . Abdominal tenderness, RLQ (right lower quadrant) 04/24/2013  . Acute bacterial sinusitis 03/22/2013  . Acute bronchitis 03/24/2018   1/20  . Anemia associated with acute blood loss 06/04/2018  . Bilateral renal cysts 09/07/2011  . BRBPR (bright red blood per rectum) 06/04/2018  . Cerumen impaction 03/24/2018   1/20 L  . CKD (chronic kidney disease) stage 5, GFR less than 15 ml/min (HCC) 08/25/2008   Estimated Creatinine Clearance: 12.7 mL/min (A) (by C-G formula based on SCr  of 3.58 mg/dL (H)).  . Constipation, chronic 04/24/2013  . Diabetes mellitus    type II  . 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 DM (diabetes mellitus), type 2 with renal complications ([place]) 4/70/9628  . ELECTROCARDIOGRAM, ABNORMAL 08/25/2008   Qualifier: Diagnosis of  By: Ronnald Ramp MD, Arvid Right.   . Essential hypertension 08/25/2008  . Gastroesophageal reflux disease without esophagitis 07/06/2015  . GI bleeding 06/07/2018  . Gout   . GOUT 08/25/2008       . Greater trochanteric bursitis of right hip 11/07/2017   Injected November 07, 2017 repeat injection Jul 13, 2018 repeat injection September 20, 2018  . Hip pain, chronic, right 07/23/2012   Dg Hip Complete Right  07/23/2012   *RADIOLOGY REPORT*  Clinical Data: Right hip pain  RIGHT HIP - COMPLETE 2+ VIEW  Comparison: None.  Findings: No fracture or dislocation is seen.  Visualized bony pelvis appears intact.  Bilateral hip joint spaces are symmetric.  Degenerative changes of the lower lumbar spine.  IMPRESSION: No acute osseous abnormality is seen.   Original Report Authenticated By:   . Hyperlipidemia   . Hyperlipidemia with target LDL less than 100 12/30/2009  . Hypertension   . Insomnia secondary to chronic pain 07/11/2018  . LBP (low back pain)    foraminal narrowing at L4-5 and disc bulge at L5- S1  on MRI 6-08  . Lumbar radiculopathy 12/11/2017  . Morbid obesity ([place]) 03/22/2013  . Otitis media 03/24/2018   1/20 R and possibly L  . Persistent atrial fibrillation ([place]) 06/07/2018  . Renal insufficiency   . Right hip pain 10/16/2017  . Shoulder injury 05/21/2012  . Urinary incontinence   . Uterine fibroid 05/16/2013  . UTI (lower urinary tract infection) 04/24/2013    Current Medications: Current Meds  Medication Sig  . acetaminophen (TYLENOL) 500 MG tablet Take 1,000 mg by mouth as needed for mild pain or headache.   . allopurinol (ZYLOPRIM) 100 MG tablet Take 1 tablet (100 mg total) by mouth daily.  . Blood Glucose Monitoring Suppl (BAYER CONTOUR MONITOR)  W/DEVICE KIT 1 Act by Does not apply route 2 (two) times daily.  . famotidine (PEPCID) 40 MG tablet Take 40 mg by mouth daily.  . fluticasone (FLONASE) 50 MCG/ACT nasal spray Place 2 sprays into both nostrils as needed for allergies.  . furosemide (LASIX) 40 MG tablet Take 40 mg by mouth daily.  Marland Kitchen glucose blood (BAYER CONTOUR TEST) test strip Use BID  . LINZESS 145 MCG CAPS capsule TAKE 1 CAPSULE BY MOUTH EVERY DAY  . Multiple Vitamins-Minerals (CENTRUM SILVER PO) Take 1 tablet by mouth daily.    . nebivolol (BYSTOLIC) 10 MG tablet Take 10 mg by mouth daily.     Allergies:   Metformin and Crestor [rosuvastatin]   Social History   Tobacco Use  . Smoking status: Never Smoker  . Smokeless tobacco: Never Used  Vaping Use  . Vaping Use: Never used  Substance Use Topics  . Alcohol use: No  . Drug use: No     Family Hx: The patient's family history includes Diabetes in an other family member; Hypertension in an other family member.  Review of Systems  Gastrointestinal: Negative for hematochezia and melena.     EKGs/Labs/Other Test Reviewed:    EKG:  EKG is not ordered today.  The ekg ordered today demonstrates n/a  Recent Labs: 12/01/2019: Magnesium 2.0 12/06/2019: ALT 13; BUN 52; Creat 4.06; Potassium 4.6; Sodium 143 01/20/2020: Hemoglobin 11.5; Platelets 204.0; TSH 2.88   Recent Lipid Panel Lab Results  Component Value Date/Time   CHOL 154 11/12/2018 09:39 AM   TRIG 123.0 11/12/2018 09:39 AM   HDL 52.20 11/12/2018 09:39 AM   CHOLHDL 3 11/12/2018 09:39 AM   LDLCALC 78 11/12/2018 09:39 AM   LDLDIRECT 90.0 07/06/2015 09:57 AM      Risk Assessment/Calculations:     CHA2DS2-VASc Score = 6  This indicates a 9.7% annual risk of stroke. The patient's score is based upon: CHF History: 1 HTN History: 1 Diabetes History: 1 Stroke History: 0 Vascular Disease History: 0 Age Score: 2 Gender Score: 1     Physical Exam:    VS:  BP (!) 148/90   Pulse (!) 52   Ht _0   (1.676 m)   Wt 157 lb 6.4 oz (71.4 kg)   SpO2 99%   BMI 25.41 kg/m     Wt Readings from Last 3 Encounters:  02/18/20 157 lb 6.4 oz (71.4 kg)  01/20/20 154 lb (69.9 kg)  01/20/20 154 lb (69.9 kg)     Constitutional:      Appearance: Healthy appearance. Not in distress.  Pulmonary:     Effort: Pulmonary effort is normal.     Breath sounds: No wheezing. No rales.  Cardiovascular:     Normal rate. Irregularly irregular rhythm. Normal S1. Normal  S2.     Murmurs: There is no murmur.  Edema:    Peripheral edema absent.  Abdominal:     Palpations: Abdomen is soft.  Musculoskeletal:     Cervical back: Neck supple. Skin:    General: Skin is warm and dry.  Neurological:     Mental Status: Alert and oriented to person, place and time.     Cranial Nerves: Cranial nerves are intact.      ASSESSMENT & PLAN:    1. HFrEF (heart failure with reduced ejection fraction) ([place]) EF 40 by echocardiogram in September 2020.  NYHA IIb.  Volume status appears stable.  Continue beta-blocker, furosemide.  She cannot take ACE/ARB/ARNI secondary to chronic kidney disease.  Blood pressure is above target.  Start hydralazine 10 mg 3 times a day.  Consider isosorbide at follow-up in 3 months.  2. Essential hypertension Blood pressure above target.  Start hydralazine 10 mg 3 times a day.  3. Persistent atrial fibrillation (HCC) She is no longer on anticoagulation given history of recurrent GI bleeding.  I will review further with Dr. Acie Fredrickson whether or not we should consider sending her to gastroenterology versus just continuing her off of anticoagulation.  4. CKD (chronic kidney disease) stage 5, GFR less than 15 ml/min (HCC) She continues follow-up with nephrology (Dr. Moshe Cipro)  5. History of GI bleed She has not had recurrent bleeding since stopping Apixaban.     Dispo:  Return in about 3 months (around 05/18/2020) for Routine Follow Up, w/ Dr. Acie Fredrickson, in person.   Medication Adjustments/Labs  and Tests Ordered: Current medicines are reviewed at length with the patient today.  Concerns regarding medicines are outlined above.  Tests Ordered: No orders of the defined types were placed in this encounter.  Medication Changes: Meds ordered this encounter  Medications  . hydrALAZINE (APRESOLINE) 10 MG tablet    Sig: Take 1 tablet (10 mg total) by mouth 3 (three) times daily.    Dispense:  270 tablet    Refill:  3    Signed, Richardson Dopp, PA-C  02/18/2020 4:41 PM    [facility] [address], [place], [place]  [address] Phone: [phone]; Fax: [phone]

## 2020-02-18 ENCOUNTER — Ambulatory Visit: Payer: Medicare Other | Admitting: Physician Assistant

## 2020-02-18 ENCOUNTER — Encounter: Payer: Self-pay | Admitting: Physician Assistant

## 2020-02-18 ENCOUNTER — Other Ambulatory Visit: Payer: Self-pay

## 2020-02-18 VITALS — BP 148/90 | HR 52 | Ht 66.0 in | Wt 157.4 lb

## 2020-02-18 DIAGNOSIS — I5020 Unspecified systolic (congestive) heart failure: Secondary | ICD-10-CM

## 2020-02-18 DIAGNOSIS — Z8719 Personal history of other diseases of the digestive system: Secondary | ICD-10-CM | POA: Diagnosis not present

## 2020-02-18 DIAGNOSIS — I10 Essential (primary) hypertension: Secondary | ICD-10-CM

## 2020-02-18 DIAGNOSIS — I4819 Other persistent atrial fibrillation: Secondary | ICD-10-CM

## 2020-02-18 DIAGNOSIS — N185 Chronic kidney disease, stage 5: Secondary | ICD-10-CM

## 2020-02-18 MED ORDER — HYDRALAZINE HCL 10 MG PO TABS: 10.0000 mg | ORAL_TABLET | Freq: Three times a day (TID) | ORAL | 3 refills | Status: DC

## 2020-02-18 NOTE — Patient Instructions (Signed)
Medication Instructions:  Your physician has recommended you make the following change in your medication:   1) Start Hydralazine 10 mg, 1 tablet by mouth three times a day  *If you need a refill on your cardiac medications before your next appointment, please call your pharmacy*  Lab Work: None ordered today  If you have labs (blood work) drawn today and your tests are completely normal, you will receive your results only by: Marland Kitchen MyChart Message (if you have MyChart) OR . A paper copy in the mail If you have any lab test that is abnormal or we need to change your treatment, we will call you to review the results.  Testing/Procedures: None ordered today  Follow-Up: On 05/22/2020 at 10:40AM with Mertie Moores, MD

## 2020-04-03 ENCOUNTER — Other Ambulatory Visit: Payer: Self-pay | Admitting: Internal Medicine

## 2020-04-03 DIAGNOSIS — M1A39X0 Chronic gout due to renal impairment, multiple sites, without tophus (tophi): Secondary | ICD-10-CM

## 2020-04-17 DIAGNOSIS — N185 Chronic kidney disease, stage 5: Secondary | ICD-10-CM | POA: Diagnosis not present

## 2020-04-17 DIAGNOSIS — I120 Hypertensive chronic kidney disease with stage 5 chronic kidney disease or end stage renal disease: Secondary | ICD-10-CM | POA: Diagnosis not present

## 2020-04-17 DIAGNOSIS — M109 Gout, unspecified: Secondary | ICD-10-CM | POA: Diagnosis not present

## 2020-04-20 ENCOUNTER — Other Ambulatory Visit: Payer: Self-pay

## 2020-04-20 ENCOUNTER — Encounter: Payer: Self-pay | Admitting: Internal Medicine

## 2020-04-20 ENCOUNTER — Ambulatory Visit (INDEPENDENT_AMBULATORY_CARE_PROVIDER_SITE_OTHER): Payer: Medicare Other | Admitting: Internal Medicine

## 2020-04-20 ENCOUNTER — Telehealth: Payer: Self-pay

## 2020-04-20 VITALS — BP 136/82 | HR 60 | Temp 98.3°F | Ht 66.0 in | Wt 154.0 lb

## 2020-04-20 DIAGNOSIS — N185 Chronic kidney disease, stage 5: Secondary | ICD-10-CM | POA: Diagnosis not present

## 2020-04-20 DIAGNOSIS — I10 Essential (primary) hypertension: Secondary | ICD-10-CM | POA: Diagnosis not present

## 2020-04-20 DIAGNOSIS — D631 Anemia in chronic kidney disease: Secondary | ICD-10-CM | POA: Diagnosis not present

## 2020-04-20 LAB — BASIC METABOLIC PANEL
BUN: 78 mg/dL — ABNORMAL HIGH (ref 6–23)
CO2: 26 mEq/L (ref 19–32)
Calcium: 11 mg/dL — ABNORMAL HIGH (ref 8.4–10.5)
Chloride: 104 mEq/L (ref 96–112)
Creatinine, Ser: 4.89 mg/dL (ref 0.40–1.20)
GFR: 7.56 mL/min — CL (ref >60.00)
Glucose, Bld: 89 mg/dL (ref 70–99)
Potassium: 3.7 mEq/L (ref 3.5–5.1)
Sodium: 142 mEq/L (ref 135–145)

## 2020-04-20 LAB — CBC WITH DIFFERENTIAL/PLATELET
Basophils Absolute: 0 10*3/uL (ref 0.0–0.1)
Basophils Relative: 0.1 % (ref 0.0–3.0)
Eosinophils Absolute: 0.8 10*3/uL — ABNORMAL HIGH (ref 0.0–0.7)
Eosinophils Relative: 12.5 % — ABNORMAL HIGH (ref 0.0–5.0)
HCT: 35.5 % — ABNORMAL LOW (ref 36.0–46.0)
Hemoglobin: 11.8 g/dL — ABNORMAL LOW (ref 12.0–15.0)
Lymphocytes Relative: 27.8 % (ref 12.0–46.0)
Lymphs Abs: 1.9 10*3/uL (ref 0.7–4.0)
MCHC: 33.1 g/dL (ref 30.0–36.0)
MCV: 101 fl — ABNORMAL HIGH (ref 78.0–100.0)
Monocytes Absolute: 0.4 10*3/uL (ref 0.1–1.0)
Monocytes Relative: 5.4 % (ref 3.0–12.0)
Neutro Abs: 3.6 10*3/uL (ref 1.4–7.7)
Neutrophils Relative %: 54.2 % (ref 43.0–77.0)
Platelets: 232 10*3/uL (ref 150.0–400.0)
RBC: 3.51 Mil/uL — ABNORMAL LOW (ref 3.87–5.11)
RDW: 14.6 % (ref 11.5–15.5)
WBC: 6.7 10*3/uL (ref 4.0–10.5)

## 2020-04-20 NOTE — Patient Instructions (Signed)

## 2020-04-20 NOTE — Telephone Encounter (Signed)
CRITICAL VALUE STICKER  CRITICAL VALUE: Creatinine 4.89; GFR 7.56  RECEIVER (on-site recipient of call): Elza Rafter [facility]  DATE & TIME NOTIFIED: 04/20/20 1215  MESSENGER (representative from lab): Janalyn Shy  MD NOTIFIED: Dr Ronnald Ramp  TIME OF NOTIFICATION: 1218  RESPONSE: Awaiting response

## 2020-04-20 NOTE — Progress Notes (Signed)
Subjective:  Patient ID: Danielle Frazier, female    DOB: [birth year]  Age: 87 y.o. MRN: [id number]  CC: Anemia and Hypertension  This visit occurred during the SARS-CoV-2 public health emergency.  Safety protocols were in place, including screening questions prior to the visit, additional usage of staff PPE, and extensive cleaning of exam room while observing appropriate contact time as indicated for disinfecting solutions.    HPI Danielle Frazier presents for f/up -  She tells me she is in her usual state of health.  She denies headache, blurred vision, chest pain, shortness of breath, or edema.  Outpatient Medications Prior to Visit  Medication Sig Dispense Refill  . acetaminophen (TYLENOL) 500 MG tablet Take 1,000 mg by mouth as needed for mild pain or headache.     . allopurinol (ZYLOPRIM) 100 MG tablet TAKE 1 TABLET BY MOUTH EVERY DAY 90 tablet 1  . Blood Glucose Monitoring Suppl (BAYER CONTOUR MONITOR) W/DEVICE KIT 1 Act by Does not apply route 2 (two) times daily. 100 kit 0  . famotidine (PEPCID) 40 MG tablet Take 40 mg by mouth daily.    . fluticasone (FLONASE) 50 MCG/ACT nasal spray Place 2 sprays into both nostrils as needed for allergies. 48 g 3  . furosemide (LASIX) 40 MG tablet Take 40 mg by mouth daily.    Marland Kitchen glucose blood (BAYER CONTOUR TEST) test strip Use BID 100 each 11  . hydrALAZINE (APRESOLINE) 10 MG tablet Take 1 tablet (10 mg total) by mouth 3 (three) times daily. 270 tablet 3  . LINZESS 145 MCG CAPS capsule TAKE 1 CAPSULE BY MOUTH EVERY DAY 90 capsule 1  . Multiple Vitamins-Minerals (CENTRUM SILVER PO) Take 1 tablet by mouth daily.    . nebivolol (BYSTOLIC) 10 MG tablet Take 10 mg by mouth daily.     No facility-administered medications prior to visit.    ROS Review of Systems  Constitutional: Negative.  Negative for diaphoresis, fatigue and unexpected weight change.  HENT: Negative.   Eyes: Negative.   Respiratory: Negative for cough, chest tightness, shortness  of breath and wheezing.   Cardiovascular: Negative for chest pain, palpitations and leg swelling.  Gastrointestinal: Negative for abdominal pain, blood in stool, constipation, diarrhea, nausea and vomiting.  Endocrine: Negative.   Genitourinary: Negative.  Negative for difficulty urinating.  Musculoskeletal: Negative.  Negative for arthralgias and myalgias.  Skin: Negative.  Negative for color change and pallor.  Neurological: Negative.  Negative for dizziness, weakness, light-headedness and numbness.  Hematological: Negative for adenopathy. Does not bruise/bleed easily.  Psychiatric/Behavioral: Negative.     Objective:  BP 136/82   Pulse 60   Temp 98.3 F (36.8 C) (Oral)   Ht 5\' 6"  (1.676 m)   Wt 154 lb (69.9 kg)   SpO2 98%   BMI 24.86 kg/m   BP Readings from Last 3 Encounters:  04/20/20 136/82  02/18/20 (!) 148/90  01/20/20 (!) 164/82    Wt Readings from Last 3 Encounters:  04/20/20 154 lb (69.9 kg)  02/18/20 157 lb 6.4 oz (71.4 kg)  01/20/20 154 lb (69.9 kg)    Physical Exam Vitals reviewed.  Constitutional:      Appearance: Normal appearance.  HENT:     Nose: Nose normal.  Eyes:     General: No scleral icterus.    Conjunctiva/sclera: Conjunctivae normal.  Cardiovascular:     Rate and Rhythm: Normal rate and regular rhythm.     Heart sounds: No murmur heard.   Pulmonary:  Effort: Pulmonary effort is normal.     Breath sounds: No stridor. No wheezing, rhonchi or rales.  Abdominal:     General: Abdomen is flat. Bowel sounds are normal. There is no distension.     Palpations: Abdomen is soft. There is no hepatomegaly, splenomegaly or mass.  Musculoskeletal:        General: Normal range of motion.     Cervical back: Neck supple.     Right lower leg: No edema.     Left lower leg: No edema.  Skin:    General: Skin is dry.     Coloration: Skin is not pale.  Neurological:     General: No focal deficit present.     Mental Status: She is alert.     Lab  Results  Component Value Date   WBC 6.7 04/20/2020   HGB 11.8 (L) 04/20/2020   HCT 35.5 (L) 04/20/2020   PLT 232.0 04/20/2020   GLUCOSE 89 04/20/2020   CHOL 154 11/12/2018   TRIG 123.0 11/12/2018   HDL 52.20 11/12/2018   LDLDIRECT 90.0 07/06/2015   LDLCALC 78 11/12/2018   ALT 13 12/06/2019   AST 16 12/06/2019   NA 142 04/20/2020   K 3.7 04/20/2020   CL 104 04/20/2020   CREATININE 4.89 (HH) 04/20/2020   BUN 78 (H) 04/20/2020   CO2 26 04/20/2020   TSH 2.88 01/20/2020   INR 1.2 11/28/2019   HGBA1C 5.5 06/11/2019   MICROALBUR 133.0 (H) 06/11/2019    No results found.  Assessment & Plan:   Satira was seen today for anemia and hypertension.  Diagnoses and all orders for this visit:  Essential hypertension- Her blood pressure is adequately well controlled. -     CBC with Differential/Platelet; Future -     Basic metabolic panel; Future -     Basic metabolic panel -     CBC with Differential/Platelet  CKD (chronic kidney disease) stage 5, GFR less than 15 ml/min (HCC)- Her renal function is stable. -     CBC with Differential/Platelet; Future -     Basic metabolic panel; Future -     Basic metabolic panel -     CBC with Differential/Platelet  Anemia due to stage 5 chronic kidney disease (HCC)- Her H&H are stable. -     CBC with Differential/Platelet; Future -     CBC with Differential/Platelet   I am having Tami C. Lanter maintain her Multiple Vitamins-Minerals (CENTRUM SILVER PO), Bayer Contour Monitor, glucose blood, Linzess, famotidine, furosemide, acetaminophen, fluticasone, nebivolol, hydrALAZINE, and allopurinol.  No orders of the defined types were placed in this encounter.    Follow-up: Return in about 6 months (around 10/18/2020).  Danielle Linger, MD

## 2020-04-21 ENCOUNTER — Encounter: Payer: Self-pay | Admitting: Internal Medicine

## 2020-05-22 ENCOUNTER — Encounter: Payer: Self-pay | Admitting: Cardiovascular Disease

## 2020-05-22 ENCOUNTER — Ambulatory Visit: Payer: Medicare Other | Admitting: Cardiovascular Disease

## 2020-05-22 ENCOUNTER — Other Ambulatory Visit: Payer: Self-pay

## 2020-05-22 VITALS — BP 132/76 | HR 68 | Ht 66.0 in | Wt 156.6 lb

## 2020-05-22 DIAGNOSIS — I4819 Other persistent atrial fibrillation: Secondary | ICD-10-CM | POA: Diagnosis not present

## 2020-05-22 DIAGNOSIS — I10 Essential (primary) hypertension: Secondary | ICD-10-CM | POA: Diagnosis not present

## 2020-05-22 NOTE — Patient Instructions (Signed)

## 2020-05-22 NOTE — Progress Notes (Signed)
Cardiology Office Note:    Date:  05/22/2020   ID:  Danielle Frazier, DOB January 24, 1931, MRN [id number]  PCP:  [name], MD  Cardiologist:  Hollace Michelli  Electrophysiologist:  None   Referring MD: [name], MD   Chief Complaint  Patient presents with  . Atrial Fibrillation  . Hypertension    History of Present Illness:    Danielle Frazier is a 87 y.o. female with a hx of HTN and dyspnea.and a new dx of Afib  She was anemic in march and had a transfusion. She was short of breath while she was anemic.    Walks on occasion.   Around the house and to the mailbox.   ECG in A[pril shows Afib. Cannot tell tht her HR is irreg.  Able to do all of her normal activities without any significant problems.  No chest pain.   Had some leg edema sevaral months ago but was started on  Indapamide and the edema has resolved.   No significant leg edema now   No cough, fever.  No PND or orthopena   Dec. 3, 2020:   Danielle Frazier is seen today for a follow-up visit for her atrial fibrillation hypertension.  She also has acute on chronic kidney disease. Still eats salty foods - popcorn.  Has CKD. Is seeing Dr.  Moshe Cipro for nephrology .   she  Discussed diaylsis .   [name] does not want to have dialysis .   August 14, 2019:   Danielle Frazier is seen today for follow-up visit regarding her atrial fibrillation and hypertension.  She has acute on chronic kidney disease.  She cannot tell that her HR is irreg.  Walks with a walker for stability   Has pain in her right leg.  Pulses were checked and her distal foot pulses are bounding .    BP [place] up today   May 22, 2020 Seen for her persistent Afib  No longer on anticoagulation due to GI bleeding .  Overall she is doing well for age 35    Past Medical History:  Diagnosis Date  . Abdominal tenderness, RLQ (right lower quadrant) 04/24/2013  . Acute bacterial sinusitis 03/22/2013  . Acute bronchitis 03/24/2018   1/20  . Anemia associated with acute blood loss  06/04/2018  . Bilateral renal cysts 09/07/2011  . BRBPR (bright red blood per rectum) 06/04/2018  . Cerumen impaction 03/24/2018   1/20 L  . CKD (chronic kidney disease) stage 5, GFR less than 15 ml/min (HCC) 08/25/2008   Estimated Creatinine Clearance: 12.7 mL/min (A) (by C-G formula based on SCr of 3.58 mg/dL (H)).  . Constipation, chronic 04/24/2013  . Diabetes mellitus    type II  . 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 DM (diabetes mellitus), type 2 with renal complications ([place]) 03/24/5518  . ELECTROCARDIOGRAM, ABNORMAL 08/25/2008   Qualifier: Diagnosis of  By: Ronnald Ramp MD, Arvid Right.   . Essential hypertension 08/25/2008  . Gastroesophageal reflux disease without esophagitis 07/06/2015  . GI bleeding 06/07/2018  . Gout   . GOUT 08/25/2008       . Greater trochanteric bursitis of right hip 11/07/2017   Injected November 07, 2017 repeat injection Jul 13, 2018 repeat injection September 20, 2018  . Hip pain, chronic, right 07/23/2012   Dg Hip Complete Right  07/23/2012   *RADIOLOGY REPORT*  Clinical Data: Right hip pain  RIGHT HIP - COMPLETE 2+ VIEW  Comparison: None.  Findings: No fracture or dislocation is  seen.  Visualized bony pelvis appears intact.  Bilateral hip joint spaces are symmetric.  Degenerative changes of the lower lumbar spine.  IMPRESSION: No acute osseous abnormality is seen.   Original Report Authenticated By:   . Hyperlipidemia   . Hyperlipidemia with target LDL less than 100 12/30/2009  . Hypertension   . Insomnia secondary to chronic pain 07/11/2018  . LBP (low back pain)    foraminal narrowing at L4-5 and disc bulge at L5- S1 on MRI 6-08  . Lumbar radiculopathy 12/11/2017  . Morbid obesity ([place]) 03/22/2013  . Otitis media 03/24/2018   1/20 R and possibly L  . Persistent atrial fibrillation ([place]) 06/07/2018  . Renal insufficiency   . Right hip pain 10/16/2017  . Shoulder injury 05/21/2012  . Urinary incontinence   . Uterine fibroid 05/16/2013  . UTI (lower urinary tract infection) 04/24/2013     Past Surgical History:  Procedure Laterality Date  . COLONOSCOPY WITH PROPOFOL N/A 11/30/2019   Procedure: COLONOSCOPY WITH PROPOFOL;  Surgeon: [name], MD;  Location: [facility];  Service: Endoscopy;  Laterality: N/A;  . ESOPHAGOGASTRODUODENOSCOPY (EGD) WITH PROPOFOL N/A 06/08/2018   Procedure: ESOPHAGOGASTRODUODENOSCOPY (EGD) WITH PROPOFOL;  Surgeon: [name], MD;  Location: [facility];  Service: Gastroenterology;  Laterality: N/A;    Current Medications: Current Meds  Medication Sig  . acetaminophen (TYLENOL) 500 MG tablet Take 1,000 mg by mouth as needed for mild pain or headache.   . allopurinol (ZYLOPRIM) 100 MG tablet TAKE 1 TABLET BY MOUTH EVERY DAY  . Blood Glucose Monitoring Suppl (BAYER CONTOUR MONITOR) W/DEVICE KIT 1 Act by Does not apply route 2 (two) times daily.  . famotidine (PEPCID) 40 MG tablet Take 40 mg by mouth daily.  . fluticasone (FLONASE) 50 MCG/ACT nasal spray Place 2 sprays into both nostrils as needed for allergies.  . furosemide (LASIX) 40 MG tablet Take 40 mg by mouth daily.  Marland Kitchen glucose blood (BAYER CONTOUR TEST) test strip Use BID  . LINZESS 145 MCG CAPS capsule TAKE 1 CAPSULE BY MOUTH EVERY DAY  . Multiple Vitamins-Minerals (CENTRUM SILVER PO) Take 1 tablet by mouth daily.  . nebivolol (BYSTOLIC) 10 MG tablet Take 10 mg by mouth daily.  . [DISCONTINUED] hydrALAZINE (APRESOLINE) 10 MG tablet Take 1 tablet (10 mg total) by mouth 3 (three) times daily.     Allergies:   Metformin and Crestor [rosuvastatin]   Social History   Socioeconomic History  . Marital status: Married    Spouse name: Not on file  . Number of children: Not on file  . Years of education: Not on file  . Highest education level: Not on file  Occupational History  . Not on file  Tobacco Use  . Smoking status: Never Smoker  . Smokeless tobacco: Never Used  Vaping Use  . Vaping Use: Never used  Substance and Sexual Activity  . Alcohol use: No  . Drug  use: No  . Sexual activity: Not Currently  Other Topics Concern  . Not on file  Social History Narrative  . Not on file   Social Determinants of Health   Financial Resource Strain: Low Risk   . Difficulty of Paying Living Expenses: Not hard at all  Food Insecurity: No Food Insecurity  . Worried About Charity fundraiser in the Last Year: Never true  . Ran Out of Food in the Last Year: Never true  Transportation Needs: No Transportation Needs  . Lack of Transportation (Medical): No  .  Lack of Transportation (Non-Medical): No  Physical Activity: Inactive  . Days of Exercise per Week: 0 days  . Minutes of Exercise per Session: 0 min  Stress: No Stress Concern Present  . Feeling of Stress : Not at all  Social Connections: Not on file     Family History: The patient's family history includes Diabetes in an other family member; Hypertension in an other family member.  ROS:   Please see the history of present illness.     All other systems reviewed and are negative.  EKGs/Labs/Other Studies Reviewed:    The following studies were reviewed today:   Recent Labs: 12/01/2019: Magnesium 2.0 12/06/2019: ALT 13 01/20/2020: TSH 2.88 04/20/2020: BUN 78; Creatinine, Ser 4.89; Hemoglobin 11.8; Platelets 232.0; Potassium 3.7; Sodium 142  Recent Lipid Panel    Component Value Date/Time   CHOL 154 11/12/2018 0939   TRIG 123.0 11/12/2018 0939   HDL 52.20 11/12/2018 0939   CHOLHDL 3 11/12/2018 0939   VLDL 24.6 11/12/2018 0939   LDLCALC 78 11/12/2018 0939   LDLDIRECT 90.0 07/06/2015 0957    Physical Exam: Blood pressure 132/76, pulse 68, height '5\' 6"'  (1.676 m), weight 156 lb 9.6 oz (71 kg), SpO2 99 %.  GEN:  Well nourished, well developed in no acute distress HEENT: Normal NECK: No JVD; No carotid bruits LYMPHATICS: No lymphadenopathy CARDIAC: RRR , no murmurs, rubs, gallops RESPIRATORY:  Clear to auscultation without rales, wheezing or rhonchi  ABDOMEN: Soft, non-tender,  non-distended MUSCULOSKELETAL:  No edema; No deformity  SKIN: Warm and dry NEUROLOGIC:  Alert and oriented x 3   ECG:   ASSESSMENT:    1. Persistent atrial fibrillation ([place])   2. Essential hypertension    PLAN:      1. Atrial fibrillation:  CHADS23VASC is 50   ( female, age, HTN)  She is no longer on anticoagulation because of history of GI bleed.  She had a GI bleed this past year requiring hospitalization and multiple transfusions.  For now we will continue with rate control.   2.  Acute on chronic kidney disease: Managed by her medical doctor.  3.  Hypertension: Blood pressures well controlled today.  She has been on hydralazine in the past but stopped it for some reason.  Her blood pressure is fairly well controlled and so I do not think that she needs to restart it at this point.    Medication Adjustments/Labs and Tests Ordered: Current medicines are reviewed at length with the patient today.  Concerns regarding medicines are outlined above.  No orders of the defined types were placed in this encounter.  No orders of the defined types were placed in this encounter.   Patient Instructions  Medication Instructions:  Your physician recommends that you continue on your current medications as directed. Please refer to the Current Medication list given to you today.  *If you need a refill on your cardiac medications before your next appointment, please call your pharmacy*   Lab Work: none If you have labs (blood work) drawn today and your tests are completely normal, you will receive your results only by: Marland Kitchen MyChart Message (if you have MyChart) OR . A paper copy in the mail If you have any lab test that is abnormal or we need to change your treatment, we will call you to review the results.   Testing/Procedures: none   Follow-Up: At [facility], you and your health needs are our priority.  As part of our continuing mission to  provide you with exceptional heart  care, we have created designated Provider Care Teams.  These Care Teams include your primary Cardiologist (physician) and Advanced Practice Providers (APPs -  Physician Assistants and Nurse Practitioners) who all work together to provide you with the care you need, when you need it.  Your next appointment:   1 year(s)  The format for your next appointment:   In Person  Provider:   You may see Mertie Moores, MD or one of the following Advanced Practice Providers on your designated Care Team:    Richardson Dopp, PA-C  Robbie Lis, [place]     Signed, Mertie Moores, MD  05/22/2020 11:31 AM    [place]

## 2020-06-01 ENCOUNTER — Telehealth: Payer: Self-pay | Admitting: Internal Medicine

## 2020-06-01 NOTE — Progress Notes (Signed)
  Chronic Care Management   Note  06/01/2020 Name: Danielle Frazier MRN: [id number] DOB: [birth year]  Danielle Frazier is a 87 y.o. year old female who is a primary care patient of [name], MD. I reached out to Carey Bullocks by phone today in response to a referral sent by [name]'s PCP, [name], MD.   [name] about Chronic Care Management services today including:  1. CCM service includes personalized support from designated clinical staff supervised by her physician, including individualized plan of care and coordination with other care providers 2. 24/7 contact phone numbers for assistance for urgent and routine care needs. 3. Service will only be billed when office clinical staff spend 20 minutes or more in a month to coordinate care. 4. Only one practitioner may furnish and bill the service in a calendar month. 5. The patient may stop CCM services at any time (effective at the end of the month) by phone call to the office staff.   Patient agreed to services and verbal consent obtained.   Follow up plan:   Carley Perdue [employer] Scheduler

## 2020-06-07 ENCOUNTER — Other Ambulatory Visit: Payer: Self-pay | Admitting: Internal Medicine

## 2020-06-07 DIAGNOSIS — I4819 Other persistent atrial fibrillation: Secondary | ICD-10-CM

## 2020-06-07 DIAGNOSIS — I10 Essential (primary) hypertension: Secondary | ICD-10-CM

## 2020-07-13 DIAGNOSIS — I120 Hypertensive chronic kidney disease with stage 5 chronic kidney disease or end stage renal disease: Secondary | ICD-10-CM | POA: Diagnosis not present

## 2020-07-13 DIAGNOSIS — N185 Chronic kidney disease, stage 5: Secondary | ICD-10-CM | POA: Diagnosis not present

## 2020-07-13 DIAGNOSIS — M109 Gout, unspecified: Secondary | ICD-10-CM | POA: Diagnosis not present

## 2020-07-14 ENCOUNTER — Other Ambulatory Visit: Payer: Self-pay

## 2020-07-14 ENCOUNTER — Ambulatory Visit (INDEPENDENT_AMBULATORY_CARE_PROVIDER_SITE_OTHER): Payer: Medicare Other | Admitting: Pharmacist

## 2020-07-14 DIAGNOSIS — N185 Chronic kidney disease, stage 5: Secondary | ICD-10-CM

## 2020-07-14 DIAGNOSIS — I10 Essential (primary) hypertension: Secondary | ICD-10-CM | POA: Diagnosis not present

## 2020-07-14 DIAGNOSIS — K219 Gastro-esophageal reflux disease without esophagitis: Secondary | ICD-10-CM

## 2020-07-14 DIAGNOSIS — M1A9XX0 Chronic gout, unspecified, without tophus (tophi): Secondary | ICD-10-CM

## 2020-07-14 DIAGNOSIS — E1121 Type 2 diabetes mellitus with diabetic nephropathy: Secondary | ICD-10-CM

## 2020-07-14 DIAGNOSIS — I4819 Other persistent atrial fibrillation: Secondary | ICD-10-CM | POA: Diagnosis not present

## 2020-07-14 DIAGNOSIS — E785 Hyperlipidemia, unspecified: Secondary | ICD-10-CM

## 2020-07-14 DIAGNOSIS — M1A39X0 Chronic gout due to renal impairment, multiple sites, without tophus (tophi): Secondary | ICD-10-CM

## 2020-07-14 NOTE — Patient Instructions (Addendum)
Visit Information  Phone number for Pharmacist: [phone]  Thank you for meeting with me to discuss your medications! I look forward to working with you to achieve your health care goals. Below is a summary of what we talked about during the visit:  Goals Addressed            This Visit's Progress   . Manage My Medicine       Timeframe:  Long-Range Goal Priority:  Medium Start Date:    07/14/20                         Expected End Date:    07/14/21                   Follow Up Date 02/10/21   - call for medicine refill 2 or 3 days before it runs out - call if I am sick and can't take my medicine - keep a list of all the medicines I take; vitamins and herbals too - use a pillbox to sort medicine    Why is this important?   . These steps will help you keep on track with your medicines.   Notes:        Danielle Frazier was given information about Chronic Care Management services today including:  1. CCM service includes personalized support from designated clinical staff supervised by her physician, including individualized plan of care and coordination with other care providers 2. 24/7 contact phone numbers for assistance for urgent and routine care needs. 3. Standard insurance, coinsurance, copays and deductibles apply for chronic care management only during months in which we provide at least 20 minutes of these services. Most insurances cover these services at 100%, however patients may be responsible for any copay, coinsurance and/or deductible if applicable. This service may help you avoid the need for more expensive face-to-face services. 4. Only one practitioner may furnish and bill the service in a calendar month. 5. The patient may stop CCM services at any time (effective at the end of the month) by phone call to the office staff.  Patient agreed to services and verbal consent obtained.   The patient verbalized understanding of instructions, educational materials, and care  plan provided today and declined offer to receive copy of patient instructions, educational materials, and care plan.  Telephone follow up appointment with pharmacy team member scheduled for: 1 year  Charlene Brooke, PharmD, [place], CPP Clinical Pharmacist [facility] [place] [facility] Chronic Kidney Disease Chronic kidney disease (CKD) occurs when the kidneys are permanently damaged over a long period of time. When your kidneys are not working well, they cannot remove waste, fluids, and other substances from your blood as well as they did before. The substances can build up, which can worsen kidney damage and affect how your body functions. Certain foods lead to a buildup of these substances. By changing your diet, you can help prevent more kidney damage and delay or prevent the need for dialysis. What are tips for following this plan? Reading food labels  Check the amount of salt (sodium) in foods. Choose foods that have less than 300 milligrams (mg) per serving.  Check the ingredient list for phosphorus or potassium-based additives or preservatives.  Check the amount of saturated fat and trans fat. Limit or avoid these fats as told by your dietitian. Shopping  Avoid buying foods that are: ? Processed or prepackaged. ? Calcium-enriched or  that have calcium added to them (are fortified).  Do not buy foods that have salt or sodium listed among the first five ingredients.  Buy canned vegetables and beans that say "no salt added" or "low sodium" and rinse them before eating. Cooking  Soak vegetables, such as potatoes, before cooking to reduce potassium. To do this: 1. Peel and cut the vegetables into small pieces. 2. Soak the vegetables in warm water for at least 2 hours. For every 1 cup of vegetables, use 10 cups of water. 3. Drain and rinse the vegetables with warm water. 4. Boil the vegetables for at least 5 minutes. Meal planning  Limit the  amount of protein you eat from plant and animal sources each day.  Do not add salt to food when cooking or before eating.  Eat meals and snacks at around the same time each day. General information  Talk with your health care provider about whether you should take a vitamin and mineral supplement.  Use standard measuring cups and spoons to measure servings of foods. Use a kitchen scale to measure portions of protein foods.  If told by your health care provider, avoid drinking too much fluid. Measure and count all liquids, including water, ice, soups, flavored gelatin, and frozen desserts such as ice pops or ice cream. If you have diabetes:  If you have diabetes (diabetes mellitus) and CKD, it is important to keep your blood sugar (glucose) in the target range recommended by your health care provider. Follow your diabetes management plan. This may include: ? Checking your blood glucose regularly. ? Taking medicines by mouth, taking insulin, or taking both. ? Exercising for at least 30 minutes on 5 or more days each week, or as told by your health care provider. ? Tracking how many servings of carbohydrates you eat at each meal.  You may be given specific guidelines on how much of certain foods and nutrients you may eat, depending on your stage of kidney disease and whether you have high blood pressure (hypertension). Follow your meal plan as told by your dietitian. What nutrients should I limit? Work with your health care provider and dietitian to develop a meal plan that is right for you. Foods you can eat and foods you should limit or avoid will depend on the stage of your kidney disease and any other health conditions you have. The items listed below are not a complete list. Talk with your dietitian about what dietary choices are best for you. Potassium Potassium affects how steadily your heart beats. If too much potassium builds up in your blood, the potassium can cause an irregular  heartbeat or even a heart attack. You may need to limit or avoid foods that are high in potassium, such as:  Milk and soy milk.  Fruits, such as bananas, apricots, nectarines, melon, prunes, raisins, kiwi, and oranges.  Vegetables, such as potatoes, sweet potatoes, yams, tomatoes, leafy greens, beets, avocado, pumpkin, and winter squash.  White and lima beans.  Whole-wheat breads and pastas.  Beans and nuts. Phosphorus Phosphorus is a mineral found in your bones. A balance between calcium and phosphorus is needed to build and maintain healthy bones. Too much phosphorus pulls calcium from your bones. This can make your bones weak and more likely to break. Too much phosphorus can also make your skin itch. You may need to limit or avoid foods that are high in phosphorus, such as:  Milk and dairy products.  Dried beans and peas.  Tofu,  soy milk, and other soy-based meat replacements.  Dark-colored sodas.  Nuts and peanut butter.  Meat, poultry, and fish.  Bran cereals and oatmeal. Protein Protein helps you make and keep muscle. It also helps to repair your body's cells and tissues. One of the natural breakdown products of protein is a waste product called urea. When your kidneys are not working properly, they cannot remove wastes, such as urea. Reducing how much protein you eat can help prevent a buildup of urea in your blood. Depending on your stage of kidney disease, you may need to limit foods that are high in protein. Sources of animal protein include:  Meat (all types).  Fish and seafood.  Poultry.  Eggs.  Dairy. Other protein foods include:  Beans and legumes.  Nuts and nut butter.  Soy and tofu.   Sodium Sodium helps to maintain a healthy balance of fluids in your body. Too much sodium can increase your blood pressure and have a negative effect on your heart and lungs. Too much sodium can also cause your body to retain too much fluid, making your kidneys work  harder. Most people should have less than 2,300 mg of sodium each day. If you have hypertension, you may need to limit your sodium to 1,500 mg each day. You may need to limit or avoid foods that are high in sodium, such as:  Salt seasonings.  Soy sauce.  Cured and processed meats.  Salted crackers and snack foods.  Fast food.  Canned soups and most canned foods.  Pickled foods.  Vegetable juice.  Boxed mixes or ready-to-eat boxed meals and side dishes.  Bottled dressings, sauces, and marinades. Talk with your dietitian about how much potassium, phosphorus, protein, and sodium you may have each day. Summary  Chronic kidney disease (CKD) can lead to a buildup of waste and extra substances in the body. Certain foods lead to a buildup of these substances. By changing your diet as told, you can help prevent more kidney damage and delay or prevent the need for dialysis.  Food intake changes are different for each person with CKD. Work with a dietitian to set up nutrient goals and a meal plan that is right for you.  If you have diabetes and CKD, it is important to keep your blood sugar in the target range recommended by your health care provider. This information is not intended to replace advice given to you by your health care provider. Make sure you discuss any questions you have with your health care provider. Document Revised: 06/24/2019 Document Reviewed: 06/24/2019 Elsevier Patient Education  2021 [employer].

## 2020-07-14 NOTE — Progress Notes (Signed)
Chronic Care Management Pharmacy Note  07/14/2020 Name:  Danielle Frazier MRN:  [id number] DOB:  [birth year]  Subjective: Danielle Frazier is an 87 y.o. year old female who is a primary patient of [name], MD.  The CCM team was consulted for assistance with disease management and care coordination needs.    Engaged with patient face to face for initial visit in response to provider referral for pharmacy case management and/or care coordination services.   Consent to Services:  The patient was given the following information about Chronic Care Management services today, agreed to services, and gave verbal consent: 1. CCM service includes personalized support from designated clinical staff supervised by the primary care provider, including individualized plan of care and coordination with other care providers 2. 24/7 contact phone numbers for assistance for urgent and routine care needs. 3. Service will only be billed when office clinical staff spend 20 minutes or more in a month to coordinate care. 4. Only one practitioner may furnish and bill the service in a calendar month. 5.The patient may stop CCM services at any time (effective at the end of the month) by phone call to the office staff. 6. The patient will be responsible for cost sharing (co-pay) of up to 20% of the service fee (after annual deductible is met). Patient agreed to services and consent obtained.  Patient Care Team: [name], MD as PCP - General [place]ah[place], Wonda Cheng, MD as PCP - Cardiology (Cardiology) Charlton Haws, [facility] as Pharmacist (Pharmacist)  Recent office visits: 04/20/20 Dr Ronnald Ramp OV: chronic f/u, no med changes.  Recent consult visits: 05/22/20 Dr Acie Fredrickson (cardiology): not on [facility] due to GI bleeding requiring transfusions. Pt stopped hydralazine on her own, BP is stable.  04/17/20 Dr Moshe Cipro (nephrology): pthas been back and forth on whether she will pursue dialysis. May be candidate for PD.  02/18/20 PA  Richardson Dopp (cardiology): f/u afib, chf. Start hydralazine 10 mg TID.  Hospital visits: None in previous 6 months  Objective:  Lab Results  Component Value Date   CREATININE 4.89 (HH) 04/20/2020   BUN 78 (H) 04/20/2020   GFR 7.56 (LL) 04/20/2020   GFRNONAA 10 (L) 12/01/2019   GFRAA 12 (L) 12/01/2019   NA 142 04/20/2020   K 3.7 04/20/2020   CALCIUM 11.0 (H) 04/20/2020   CO2 26 04/20/2020   GLUCOSE 89 04/20/2020    Lab Results  Component Value Date/Time   HGBA1C 5.5 06/11/2019 09:30 AM   HGBA1C 6.0 06/04/2018 11:28 AM   GFR 7.56 (LL) 04/20/2020 09:21 AM   GFR 12.06 (LL) 06/11/2019 09:30 AM   MICROALBUR 133.0 (H) 06/11/2019 09:30 AM   MICROALBUR 155.7 (H) 01/08/2018 09:22 AM    Last diabetic Eye exam:  Lab Results  Component Value Date/Time   HMDIABEYEEXA No Retinopathy 05/26/2015 12:00 AM    Last diabetic Foot exam:  Lab Results  Component Value Date/Time   HMDIABFOOTEX done 09/07/2011 12:00 AM     Lab Results  Component Value Date   CHOL 154 11/12/2018   HDL 52.20 11/12/2018   LDLCALC 78 11/12/2018   LDLDIRECT 90.0 07/06/2015   TRIG 123.0 11/12/2018   CHOLHDL 3 11/12/2018    Hepatic Function Latest Ref Rng & Units 12/06/2019 11/28/2019 08/22/2018  Total Protein 6.1 - 8.1 g/dL 5.7(L) 6.1(L) 6.7  Albumin 3.5 - 5.0 g/dL - 3.2(L) 3.8  AST 10 - 35 U/L '16 23 16  ' ALT 6 - 29 U/L 13 15 11  Alk Phosphatase 38 - 126 U/L - 125 127(H)  Total Bilirubin 0.2 - 1.2 mg/dL 0.4 0.3 0.5  Bilirubin, Direct 0.0 - 0.3 mg/dL - - -    Lab Results  Component Value Date/Time   TSH 2.88 01/20/2020 11:40 AM   TSH 4.050 11/14/2018 02:20 PM   FREET4 0.86 07/06/2015 09:57 AM    CBC Latest Ref Rng & Units 04/20/2020 01/20/2020 12/18/2019  WBC 4.0 - 10.5 K/uL 6.7 7.0 6.1  Hemoglobin 12.0 - 15.0 g/dL 11.8(L) 11.5(L) 9.5(L)  Hematocrit 36.0 - 46.0 % 35.5(L) 35.6(L) 28.7(L)  Platelets 150.0 - 400.0 K/uL 232.0 204.0 248.0    Lab Results  Component Value Date/Time   VD25OH 40 08/25/2008  02:25 PM    Clinical ASCVD: No  The ASCVD Risk score Mikey Bussing DC Jr., et al., 2013) failed to calculate for the following reasons:   The 2013 ASCVD risk score is only valid for ages 63 to 63    Depression screen PHQ 2/9 01/20/2020 12/06/2019 11/12/2018  Decreased Interest 0 0 0  Down, Depressed, Hopeless 0 0 0  PHQ - 2 Score 0 0 0     CHA2DS2-VASc Score = 6  The patient's score is based upon: CHF History: Yes HTN History: Yes Diabetes History: Yes Stroke History: No Vascular Disease History: No Age Score: 2 Gender Score: 1     Social History   Tobacco Use  Smoking Status Never Smoker  Smokeless Tobacco Never Used   BP Readings from Last 3 Encounters:  05/22/20 132/76  04/20/20 136/82  02/18/20 (!) 148/90   Pulse Readings from Last 3 Encounters:  05/22/20 68  04/20/20 60  02/18/20 (!) 52   Wt Readings from Last 3 Encounters:  05/22/20 156 lb 9.6 oz (71 kg)  04/20/20 154 lb (69.9 kg)  02/18/20 157 lb 6.4 oz (71.4 kg)   BMI Readings from Last 3 Encounters:  05/22/20 25.28 kg/m  04/20/20 24.86 kg/m  02/18/20 25.41 kg/m    Assessment/Interventions: Review of patient past medical history, allergies, medications, health status, including review of consultants reports, laboratory and other test data, was performed as part of comprehensive evaluation and provision of chronic care management services.   SDOH:  (Social Determinants of Health) assessments and interventions performed: Yes  SDOH Screenings   Alcohol Screen: Low Risk   . Last Alcohol Screening Score (AUDIT): 0  Depression (PHQ2-9): Low Risk   . PHQ-2 Score: 0  Financial Resource Strain: Low Risk   . Difficulty of Paying Living Expenses: Not hard at all  Food Insecurity: No Food Insecurity  . Worried About Charity fundraiser in the Last Year: Never true  . Ran Out of Food in the Last Year: Never true  Housing: Low Risk   . Last Housing Risk Score: 0  Physical Activity: Inactive  . Days of Exercise  per Week: 0 days  . Minutes of Exercise per Session: 0 min  Social Connections: Not on file  Stress: No Stress Concern Present  . Feeling of Stress : Not at all  Tobacco Use: Low Risk   . Smoking Tobacco Use: Never Smoker  . Smokeless Tobacco Use: Never Used  Transportation Needs: No Transportation Needs  . Lack of Transportation (Medical): No  . Lack of Transportation (Non-Medical): No    CCM Care Plan  Allergies  Allergen Reactions  . Metformin Other (See Comments)    REACTION: renal damage  . Crestor [Rosuvastatin] Other (See Comments)    Muscle aches  Medications Reviewed Today    Reviewed by Charlton Haws, [facility] (Pharmacist) on 07/14/20 at [phone]  Med List Status: <None>  Medication Order Taking? Sig Documenting Provider Last Dose Status Informant  acetaminophen (TYLENOL) 500 MG tablet [id number] Yes Take 1,000 mg by mouth as needed for mild pain or headache.  [provider] Taking Active Self  allopurinol (ZYLOPRIM) 100 MG tablet [id number] Yes TAKE 1 TABLET BY MOUTH EVERY DAY [name], MD Taking Active   famotidine (PEPCID) 40 MG tablet [id number] Yes Take 40 mg by mouth daily. [provider] Taking Active Self  fluticasone (FLONASE) 50 MCG/ACT nasal spray [id number] Yes Place 2 sprays into both nostrils as needed for allergies. [name], MD Taking Active   furosemide (LASIX) 40 MG tablet [id number] Yes Take 40 mg by mouth daily. [provider] Taking Active Self  LINZESS 145 MCG CAPS capsule [id number] Yes TAKE 1 CAPSULE BY MOUTH EVERY DAY [name], MD Taking Active Self  Multiple Vitamins-Minerals (CENTRUM SILVER PO) [id number] Yes Take 1 tablet by mouth daily. [provider] Taking Active Self  nebivolol (BYSTOLIC) 10 MG tablet [id number] Yes TAKE 1 TABLET BY MOUTH EVERY DAY [name], MD Taking Active           Patient Active Problem List   Diagnosis Date Noted  . Anemia due to stage 5 chronic kidney  disease ([place]) 11/28/2019  . Prolonged QT interval 11/28/2019  . DNR (do not resuscitate) 11/28/2019  . Deficiency anemia 06/11/2019  . Insomnia secondary to chronic pain 07/11/2018  . Persistent atrial fibrillation ([place]) 06/07/2018  . Lumbar radiculopathy 12/11/2017  . Greater trochanteric bursitis of right hip 11/07/2017  . Gastroesophageal reflux disease without esophagitis 07/06/2015  . Routine general medical examination at a health care facility 07/06/2015  . Constipation, chronic 04/24/2013  . Morbid obesity ([place]) 03/22/2013  . 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 Bilateral renal cysts 09/07/2011  . Hyperlipidemia with target LDL less than 100 12/30/2009  . DM (diabetes mellitus), type 2 with renal complications ([place]) 79/48/0165  . GOUT 08/25/2008  . Essential hypertension 08/25/2008  . CKD (chronic kidney disease) stage 5, GFR less than 15 ml/min (HCC) 08/25/2008  . OAB (overactive bladder) 08/25/2008    Immunization History  Administered Date(s) Administered  . Fluad Quad(high Dose 65+) 12/12/2018, 12/06/2019  . Influenza Split 01/04/2012  . Influenza, High Dose Seasonal PF 01/01/2015, 01/05/2016, 01/04/2017, 01/08/2018  . Influenza,inj,Quad PF,6+ Mos 12/31/2013  . Influenza-Unspecified 12/12/2012  . Moderna Sars-Covid-2 Vaccination 10/07/2019, 11/04/2019  . Pneumococcal Conjugate-13 08/20/2013  . Pneumococcal Polysaccharide-23 08/25/2008, 07/06/2015  . Td 08/25/2008  . Tdap 11/12/2018    Conditions to be addressed/monitored:  Hypertension, Hyperlipidemia, Diabetes, Atrial Fibrillation, GERD, Chronic Kidney Disease and Gout  Care Plan : CCM Pharmacy Care Plan  Updates made by Charlton Haws, [place] since 07/14/2020 12:00 AM    Problem: Hypertension, Hyperlipidemia, Diabetes, Atrial Fibrillation, GERD, Chronic Kidney Disease and Gout   Priority: High    Long-Range Goal: Disease management   Start Date: 07/14/2020  Expected End Date: 07/14/2021  This Visit's Progress: On  track  Priority: High  Note:   Current Barriers:  . Unable to independently monitor therapeutic efficacy  Pharmacist Clinical Goal(s):  Marland Kitchen Patient will achieve adherence to monitoring guidelines and medication adherence to achieve therapeutic efficacy through collaboration with PharmD and provider.   Interventions: . 1:1 collaboration with [name], MD regarding development and update of comprehensive plan of care as  evidenced by provider attestation and co-signature . Inter-disciplinary care team collaboration (see longitudinal plan of care) . Comprehensive medication review performed; medication list updated in electronic medical record  Hypertension / CHF / CKD stage 5 (BP goal <140/90) -Controlled - BP as been at or close to goal recently; she does not have a functioning home monitor but gets BP checked at doctors visits; pt does follow with Dr Moshe Cipro and has ongoing discussions about dialysis - currently pt is declining dialysis since she is asymptomatic; pt denies issues with swelling currently -Current treatment: . Bystolic 10 mg daily . Furosemide 40 mg daily -Medications previously tried: hydralazine -Current home readings: n/a -Denies hypotensive/hypertensive symptoms -Educated on BP goals and benefits of medications for prevention of heart attack, stroke and kidney damage; -Recommended to continue current medication  Hyperlipidemia: (LDL goal < 100) -Controlled - LDL is at goal without medications -Current treatment: . No medications -Medications previously tried: rosuvastatin  -Educated on Cholesterol goals;  -Counseled on diet and exercise extensively  Diabetes (A1c goal <7%) -Diet-controlled; pt is not monitoring BG at home -Denies hypoglycemic/hyperglycemic symptoms -Educated on A1c and blood sugar goals; given A1c in normal range, there is no need to monitor BG at home -Counseled on diet and exercise extensively  Atrial Fibrillation (Goal: prevent  stroke and major bleeding) -Controlled - pt has had multiple GI bleeds requring transfusions; cardiologist and patient have decided to forego anticoagulation -CHADSVASC: 5 -Current treatment: . Rate control: Bystolic . Anticoagulation: none -Medications previously tried: Eliquis -Counseled on stroke risk and importance of beta blocker for Afib control -Recommended to continue current medication  GERD (Goal: manage symptoms) -Controlled - pt reports symptoms are well controlled with bedtime dose -Current treatment  . Famotidine 40 mg daily -Recommended to continue current medication  Constipation (Goal: manage symptoms) -Controlled - pt uses Linzess about 3 times a week and reports this keeps BMs regular -Current treatment  . Linzess 145 mcg daily -Patient is satisfied with current regimen and denies issues -Recommended to continue current medication  Gout (Goal: prevent flares) -Controlled - pt reports she has not had a gout flare in years; denies side effects -Current treatment  . Allopurinol 100 mg daily -Evaluated necessity of medication - pt has been stable on current dose despite very low GFR, it is reasonable to continue at current dose -Recommended to continue current medication  Health Maintenance -Vaccine gaps: Shingrix -Current therapy:  . Tylenol 500 mg PRN . Fluticasone nasal spray PRN . Multivitamin -Patient is satisfied with current therapy and denies issues -Recommended to continue current medication  Patient Goals/Self-Care Activities . Patient will:  - take medications as prescribed focus on medication adherence by pill box  Follow Up Plan: Telephone follow up appointment with care management team member scheduled for: 1 year      Medication Assistance: None required.  Patient affirms current coverage meets needs.  Patient's preferred pharmacy is:  [id number] [place] [place] [id number]: [phone]: [phone] Uses pill box? Yes Pt endorses 100% compliance  We discussed: Current pharmacy is preferred with insurance plan and patient is satisfied with pharmacy services Patient decided to: Continue current medication management strategy  Care Plan and Follow Up Patient Decision:  Patient agrees to Care Plan and Follow-up.  Plan: Telephone follow up appointment with care management team member scheduled for:  1 year  LCharlene Brooke PharmD, [place] CPP Clinical  Pharmacist [facility] at [facility] [phone]

## 2020-08-25 ENCOUNTER — Other Ambulatory Visit: Payer: Self-pay | Admitting: Internal Medicine

## 2020-08-25 DIAGNOSIS — K5909 Other constipation: Secondary | ICD-10-CM

## 2020-08-27 IMAGING — CT CT ABDOMEN AND PELVIS WITHOUT CONTRAST
2 of 4 series · 15 of 46 positions shown, 17 images · non-contrast
Comparison: None.

CLINICAL DATA: Rectal bleeding. Patient denies abdominal pain.

EXAM:
CT ABDOMEN AND PELVIS WITHOUT CONTRAST
TECHNIQUE: Multidetector CT imaging of the abdomen and pelvis was performed
following the standard protocol without IV contrast.

[Series 2: axial st · axial · 0.66mm/px · z∈[+1295,+1670]mm · 12 of 85 slices shown, 14 images]
[im 5/85  soft-tissue]
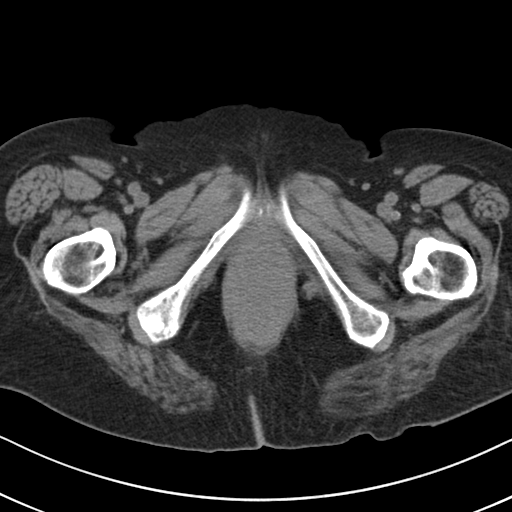
[im 5/85  bone]
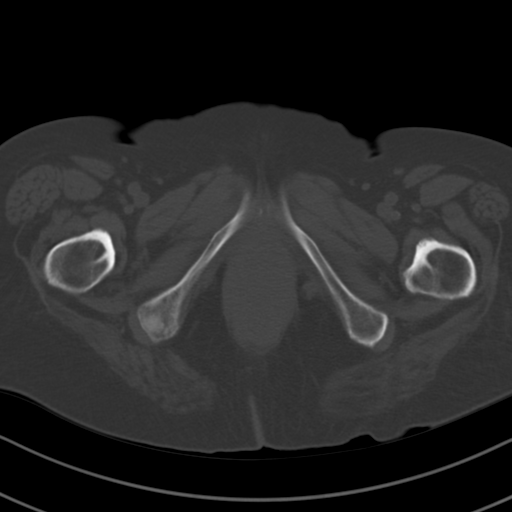
[im 15/85  soft-tissue]
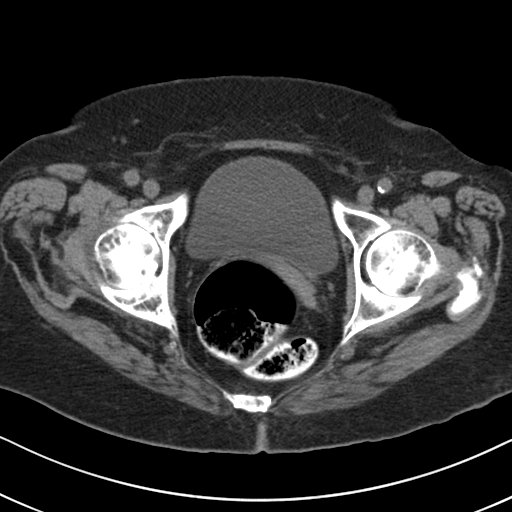
[im 20/85  soft-tissue]
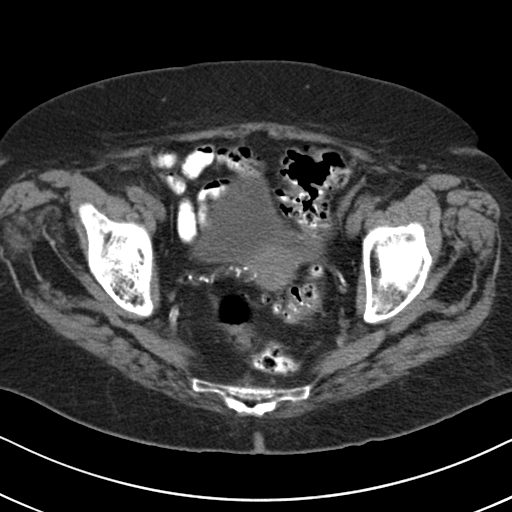
[im 25/85  soft-tissue]
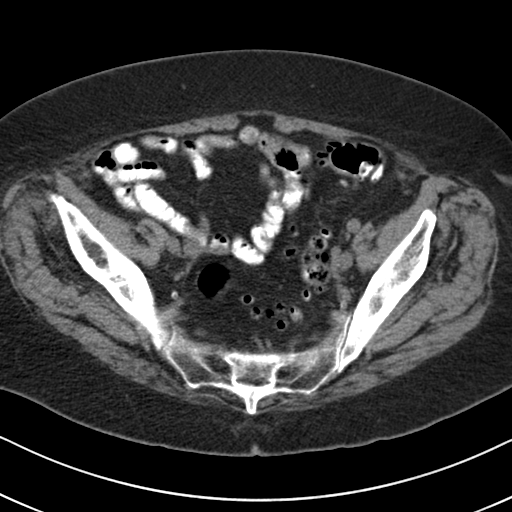
[im 35/85  soft-tissue]
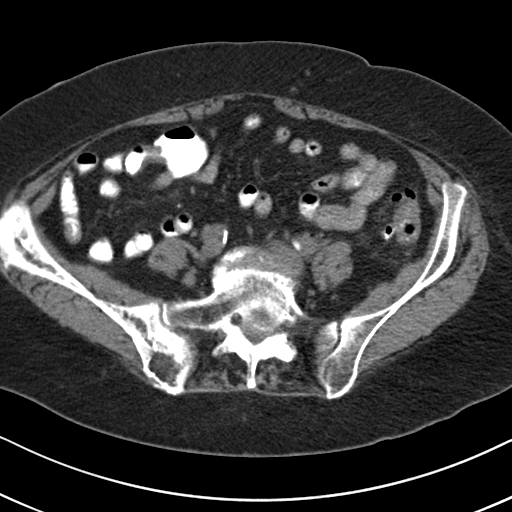
[im 40/85  soft-tissue]
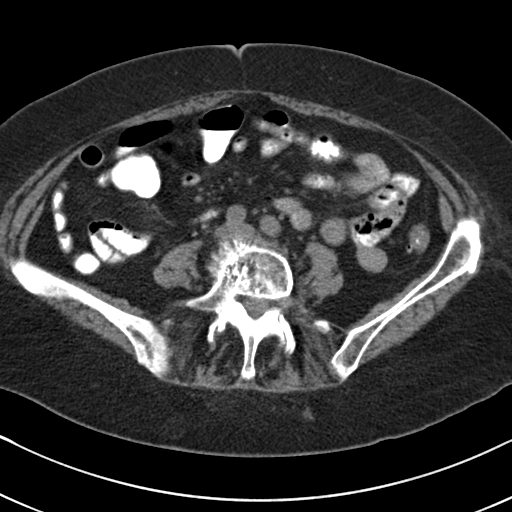
[im 45/85  soft-tissue]
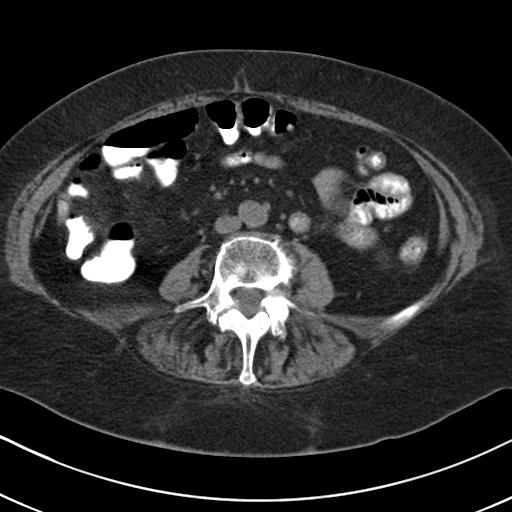
[im 55/85  soft-tissue]
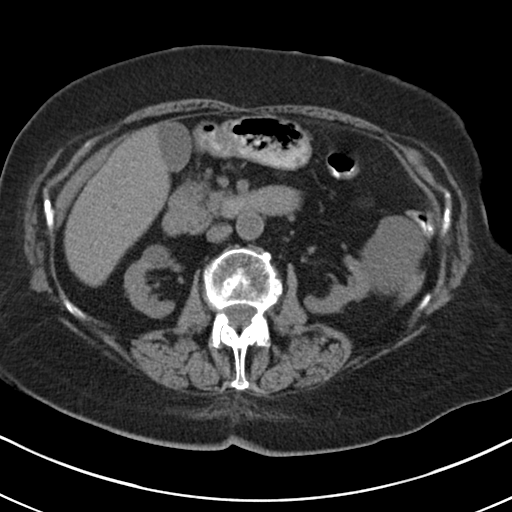
[im 60/85  soft-tissue]
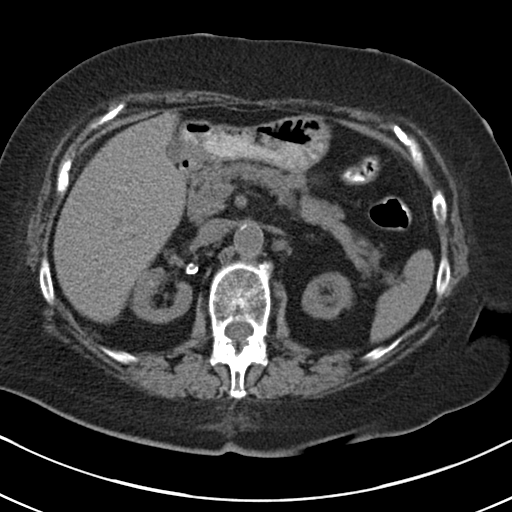
[im 60/85  bone]
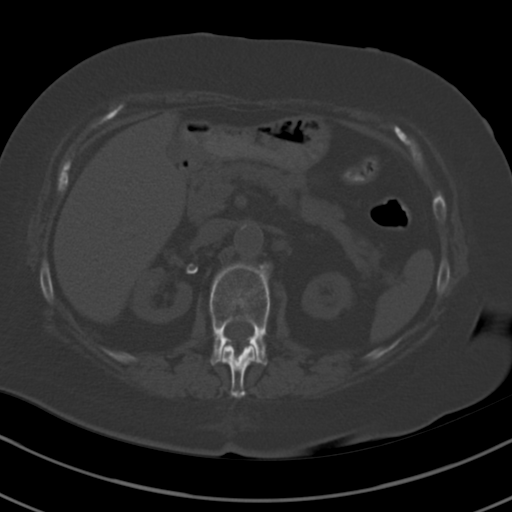
[im 65/85  soft-tissue]
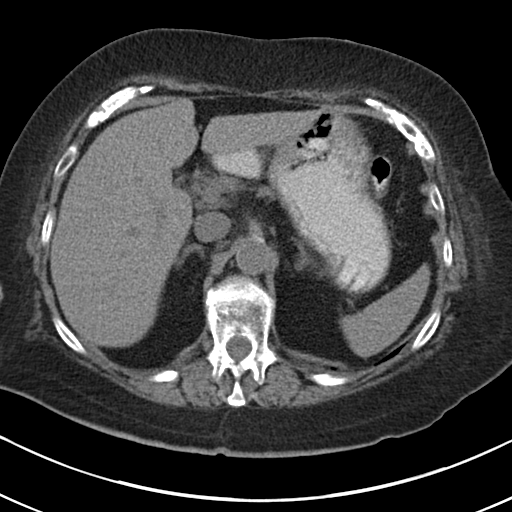
[im 75/85  soft-tissue]
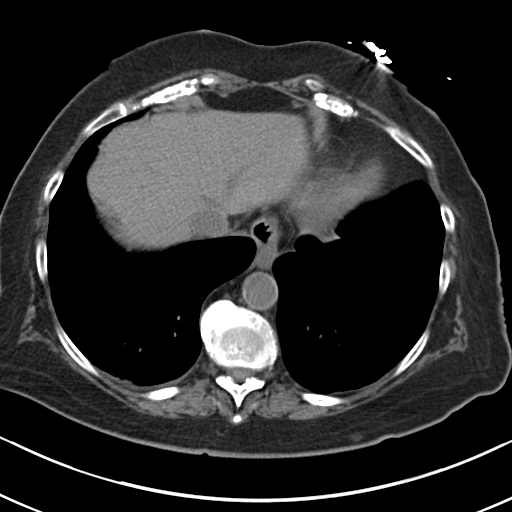
[im 80/85  soft-tissue]
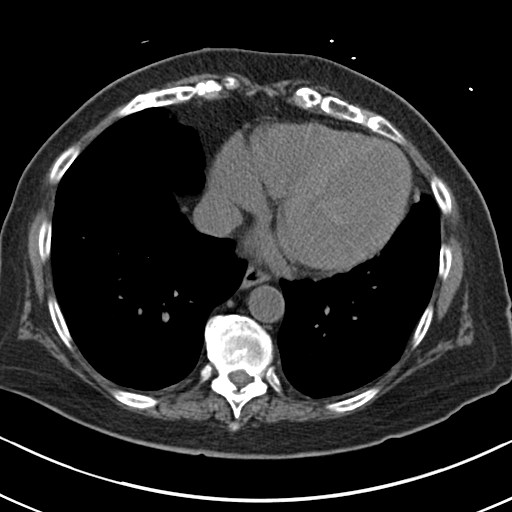

[Series 5: coronal st · coronal · 0.64mm/px · 3 of 129 slices shown]
[im 43/129  soft-tissue]
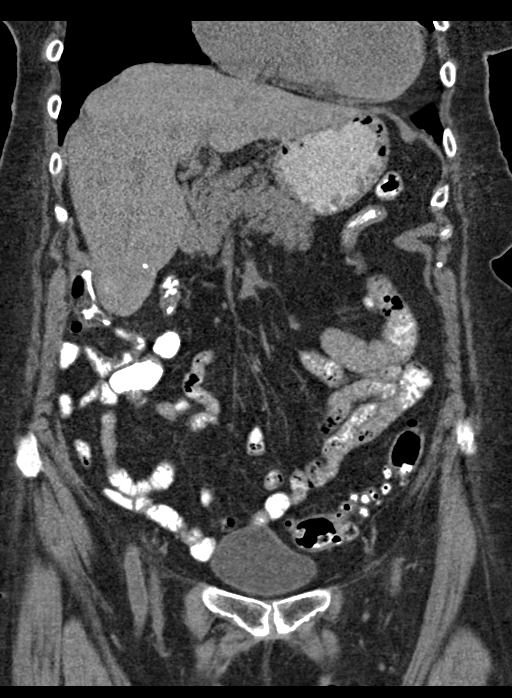
[im 57/129  soft-tissue]
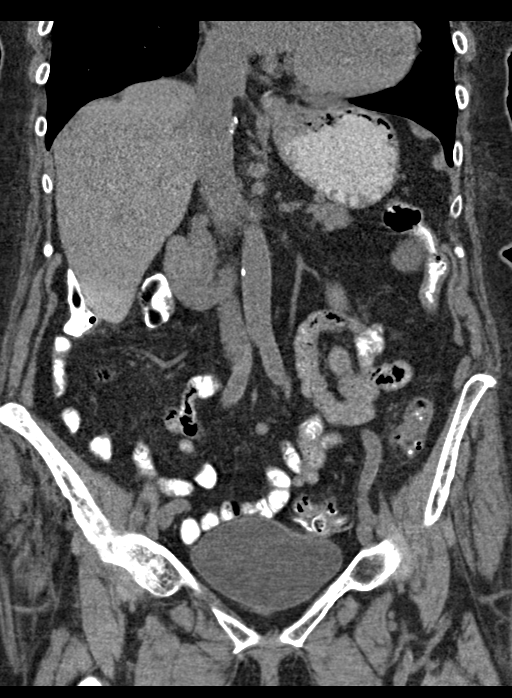
[im 72/129  soft-tissue]
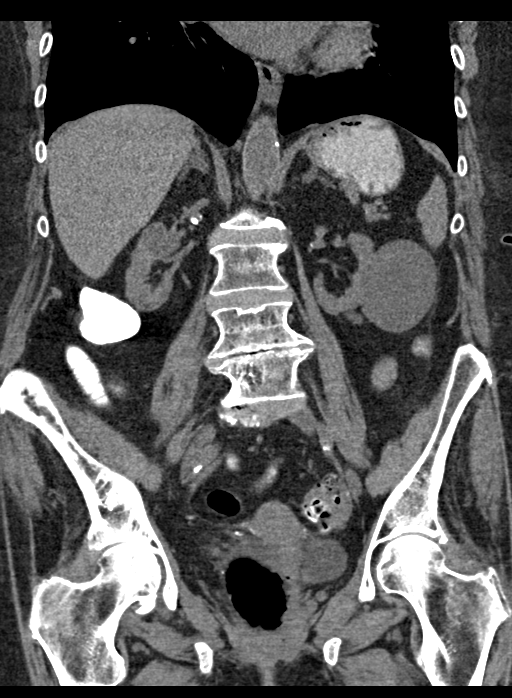

[15 of 46 positions shown; findings below may reference images not displayed]

FINDINGS: Lower chest: Small right pleural effusion and adjacent compressive
atelectasis. Trace left pleural thickening and atelectasis.

Hepatobiliary: Calcified granuloma. No focal mass on noncontrast
exam. Gallbladder physiologically distended, no calcified stone. No
biliary dilatation.

Pancreas: No ductal dilatation or inflammation.

Spleen: Normal in size without focal abnormality.

Adrenals/Urinary Tract: Mild bilateral adrenal thickening without
dominant nodule. Bilateral renal parenchymal thinning. No
hydronephrosis. There are bilateral renal cysts, largest in the
lower left kidney measuring 6 cm cranial caudal. No perinephric
edema. Urinary bladder is physiologically distended and
unremarkable.

Stomach/Bowel: Administered enteric contrast reaches the rectum.
Small hiatal hernia. Stomach physiologically distended without
gastric wall thickening. No small bowel obstruction or inflammation.
Appendix not confidently visualized. Cecum is high-riding in the
right mid-upper abdomen. Colonic diverticulosis involving the
descending and sigmoid colon. Sigmoid diverticulosis is prominent.
Mild pericolonic edema in the distal descending colon, image 53
series 2 in the region of multiple diverticula. Area of questionable
soft tissue density the mid sigmoid colon, image 64 series 2, is
felt to represent the left ovary immediately adjacent to the colon.

Vascular/Lymphatic: Mild to moderate aortic atherosclerosis. No
aortic aneurysm. Incidental 7 mm calcified right renal artery
aneurysm. No abdominopelvic adenopathy. Probable calcified pericaval
nodes adjacent to the upper IVC.

Reproductive: Uterus and bilateral adnexa are unremarkable.

Other: Fat in both inguinal canals, right greater than left. No free
air, free fluid, or intra-abdominal fluid collection.

Musculoskeletal: Degenerative change in the lumbar spine. Bones
appear under mineralized. There is trabecular coarsening with mixed
lytic and lucent areas involving the right iliac bone, sacrum, and
hemipelvis, unchanged from lumbar spine CT 01/23/2018.
IMPRESSION: 1. Colonic diverticulosis, prominent in the sigmoid colon. Mild
pericolonic stranding and edema in the distal descending colon
adjacent to multiple diverticula suspicious for uncomplicated acute
diverticulitis.
2. No other explanation for GI bleed.
3. Bilateral renal parenchymal thinning and renal cysts.
4. Trabecular coarsening throughout the right hemipelvis, pattern
most typical for Paget's disease.

Aortic Atherosclerosis (FZX2U-DM9.9).

## 2020-09-22 ENCOUNTER — Telehealth: Payer: Self-pay | Admitting: Pharmacist

## 2020-09-22 NOTE — Progress Notes (Addendum)
    Chronic Care Management Pharmacy Assistant   Name: Danielle Frazier  MRN: [id number] DOB: Jan 30, 1931  Reason for Encounter: Disease State - General Adherence  Recent office visits:  None noted  Recent consult visits:  None noted  Hospital visits:  None in previous 6 months  Medications: Outpatient Encounter Medications as of 09/22/2020  Medication Sig   acetaminophen (TYLENOL) 500 MG tablet Take 1,000 mg by mouth as needed for mild pain or headache.    allopurinol (ZYLOPRIM) 100 MG tablet TAKE 1 TABLET BY MOUTH EVERY DAY   famotidine (PEPCID) 40 MG tablet Take 40 mg by mouth daily.   fluticasone (FLONASE) 50 MCG/ACT nasal spray Place 2 sprays into both nostrils as needed for allergies.   furosemide (LASIX) 40 MG tablet Take 40 mg by mouth daily.   LINZESS 145 MCG CAPS capsule TAKE 1 CAPSULE BY MOUTH EVERY DAY   Multiple Vitamins-Minerals (CENTRUM SILVER PO) Take 1 tablet by mouth daily.   nebivolol (BYSTOLIC) 10 MG tablet TAKE 1 TABLET BY MOUTH EVERY DAY   No facility-administered encounter medications on file as of 09/22/2020.    Have you had any problems recently with your health? Patient states no recent health problems.  Have you had any problems with your pharmacy? Patient states no problems with pharmacy.   What issues or side effects are you having with your medications? Patient states no issues or side effects.   What would you like me to pass along to [facility] for them to help you with?  Patient states there's nothing to pass along and thanks for checking on her.   What can we do to take care of you better? Patient states we are doing a good job taking care of her.   Star Rating Drugs: None noted  Orinda Kenner, RMA Clinical Pharmacists Assistant [phone]  Time Spent: 6

## 2020-10-09 ENCOUNTER — Other Ambulatory Visit: Payer: Self-pay | Admitting: Internal Medicine

## 2020-10-09 DIAGNOSIS — M1A39X0 Chronic gout due to renal impairment, multiple sites, without tophus (tophi): Secondary | ICD-10-CM

## 2020-10-19 DIAGNOSIS — I120 Hypertensive chronic kidney disease with stage 5 chronic kidney disease or end stage renal disease: Secondary | ICD-10-CM | POA: Diagnosis not present

## 2020-10-19 DIAGNOSIS — N185 Chronic kidney disease, stage 5: Secondary | ICD-10-CM | POA: Diagnosis not present

## 2020-10-19 DIAGNOSIS — M109 Gout, unspecified: Secondary | ICD-10-CM | POA: Diagnosis not present

## 2020-12-16 DIAGNOSIS — H70091 Acute mastoiditis with other complications, right ear: Secondary | ICD-10-CM | POA: Diagnosis not present

## 2020-12-18 DIAGNOSIS — N185 Chronic kidney disease, stage 5: Secondary | ICD-10-CM | POA: Diagnosis not present

## 2020-12-18 DIAGNOSIS — I120 Hypertensive chronic kidney disease with stage 5 chronic kidney disease or end stage renal disease: Secondary | ICD-10-CM | POA: Diagnosis not present

## 2020-12-18 DIAGNOSIS — M109 Gout, unspecified: Secondary | ICD-10-CM | POA: Diagnosis not present

## 2020-12-22 ENCOUNTER — Other Ambulatory Visit: Payer: Self-pay | Admitting: Internal Medicine

## 2020-12-22 DIAGNOSIS — I4819 Other persistent atrial fibrillation: Secondary | ICD-10-CM

## 2020-12-22 DIAGNOSIS — I10 Essential (primary) hypertension: Secondary | ICD-10-CM

## 2020-12-29 ENCOUNTER — Telehealth: Payer: Self-pay | Admitting: Pharmacist

## 2020-12-29 NOTE — Progress Notes (Signed)
    Chronic Care Management Pharmacy Assistant   Name: DANYELLE BROOKOVER  MRN: [id number] DOB: 28-May-1930  Reason for Encounter: Disease State - General Adherence   Recent office visits:  None noted  Recent consult visits:  None noted   Hospital visits:  None in previous 6 months  Medications: Outpatient Encounter Medications as of 12/29/2020  Medication Sig   acetaminophen (TYLENOL) 500 MG tablet Take 1,000 mg by mouth as needed for mild pain or headache.    allopurinol (ZYLOPRIM) 100 MG tablet TAKE 1 TABLET BY MOUTH EVERY DAY   famotidine (PEPCID) 40 MG tablet Take 40 mg by mouth daily.   fluticasone (FLONASE) 50 MCG/ACT nasal spray Place 2 sprays into both nostrils as needed for allergies.   furosemide (LASIX) 40 MG tablet Take 40 mg by mouth daily.   LINZESS 145 MCG CAPS capsule TAKE 1 CAPSULE BY MOUTH EVERY DAY   Multiple Vitamins-Minerals (CENTRUM SILVER PO) Take 1 tablet by mouth daily.   nebivolol (BYSTOLIC) 10 MG tablet TAKE 1 TABLET BY MOUTH EVERY DAY   No facility-administered encounter medications on file as of 12/29/2020.   Have you had any problems recently with your health? Patient states no recent health problems.  Have you had any problems with your pharmacy? Patient states no problems with pharmacy.   What issues or side effects are you having with your medications? Patient states no issues or side effects.   What would you like me to pass along to Lois Huxley, CPP for them to help you with?  Patient states she thinks she is due for a couple of refills but she was on the road and will call to let her PCP know.   What can we do to take care of you better? Patient states we are doing a good job taking care of her.    Care Gaps: -Mar 23 2019 OPHTHALMOLOGY EXAM (Yearly) Last completed: Mar 22, 2018  -Jul 11 2019 FOOT EXAM (Yearly) Last completed: Jul 11, 2018  -Dec 12 2019 HEMOGLOBIN A1C (Every 6 Months) Last completed: Jun 11, 2019  - Jun 10 2020  URINE MICROALBUMIN (Yearly) Last completed: Jun 11, 2019   Star Rating Drugs: None listed  Orinda Kenner, [place] Clinical Pharmacists Assistant [phone]

## 2021-01-12 ENCOUNTER — Telehealth: Payer: Self-pay | Admitting: Internal Medicine

## 2021-01-12 NOTE — Telephone Encounter (Signed)
Patient requesting order for labs prior to cpe on 02-22-2021

## 2021-01-20 ENCOUNTER — Other Ambulatory Visit: Payer: Self-pay | Admitting: Internal Medicine

## 2021-01-20 DIAGNOSIS — N185 Chronic kidney disease, stage 5: Secondary | ICD-10-CM

## 2021-01-20 DIAGNOSIS — D631 Anemia in chronic kidney disease: Secondary | ICD-10-CM

## 2021-01-20 DIAGNOSIS — I4819 Other persistent atrial fibrillation: Secondary | ICD-10-CM

## 2021-01-20 DIAGNOSIS — K219 Gastro-esophageal reflux disease without esophagitis: Secondary | ICD-10-CM

## 2021-01-20 DIAGNOSIS — M546 Pain in thoracic spine: Secondary | ICD-10-CM

## 2021-01-20 DIAGNOSIS — E785 Hyperlipidemia, unspecified: Secondary | ICD-10-CM

## 2021-01-20 DIAGNOSIS — I10 Essential (primary) hypertension: Secondary | ICD-10-CM

## 2021-01-20 DIAGNOSIS — E1121 Type 2 diabetes mellitus with diabetic nephropathy: Secondary | ICD-10-CM

## 2021-01-20 DIAGNOSIS — M1A9XX0 Chronic gout, unspecified, without tophus (tophi): Secondary | ICD-10-CM

## 2021-01-20 NOTE — Telephone Encounter (Signed)
Pt has been informed that labs are ordered 

## 2021-01-21 DIAGNOSIS — N185 Chronic kidney disease, stage 5: Secondary | ICD-10-CM | POA: Diagnosis not present

## 2021-01-21 DIAGNOSIS — Z23 Encounter for immunization: Secondary | ICD-10-CM | POA: Diagnosis not present

## 2021-01-21 DIAGNOSIS — M109 Gout, unspecified: Secondary | ICD-10-CM | POA: Diagnosis not present

## 2021-01-21 DIAGNOSIS — I120 Hypertensive chronic kidney disease with stage 5 chronic kidney disease or end stage renal disease: Secondary | ICD-10-CM | POA: Diagnosis not present

## 2021-01-21 LAB — BASIC METABOLIC PANEL
BUN: 47 — AB (ref 4–21)
CO2: 28 — AB (ref 13–22)
Chloride: 104 (ref 99–108)
Creatinine: 4.4 — AB (ref 0.5–1.1)
Glucose: 98
Potassium: 4.2 (ref 3.4–5.3)
Sodium: 141 (ref 137–147)

## 2021-01-21 LAB — CBC AND DIFFERENTIAL: Hemoglobin: 11.7 — AB (ref 12.0–16.0)

## 2021-02-22 ENCOUNTER — Other Ambulatory Visit: Payer: Self-pay

## 2021-02-22 ENCOUNTER — Encounter: Payer: Self-pay | Admitting: Internal Medicine

## 2021-02-22 ENCOUNTER — Ambulatory Visit (INDEPENDENT_AMBULATORY_CARE_PROVIDER_SITE_OTHER): Payer: Medicare Other | Admitting: Internal Medicine

## 2021-02-22 VITALS — BP 146/76 | HR 67 | Temp 98.0°F | Ht 66.0 in | Wt 164.0 lb

## 2021-02-22 DIAGNOSIS — I4819 Other persistent atrial fibrillation: Secondary | ICD-10-CM | POA: Diagnosis not present

## 2021-02-22 DIAGNOSIS — R051 Acute cough: Secondary | ICD-10-CM | POA: Diagnosis not present

## 2021-02-22 DIAGNOSIS — Z0001 Encounter for general adult medical examination with abnormal findings: Secondary | ICD-10-CM

## 2021-02-22 DIAGNOSIS — J208 Acute bronchitis due to other specified organisms: Secondary | ICD-10-CM | POA: Diagnosis not present

## 2021-02-22 DIAGNOSIS — U071 COVID-19: Secondary | ICD-10-CM | POA: Diagnosis not present

## 2021-02-22 DIAGNOSIS — I10 Essential (primary) hypertension: Secondary | ICD-10-CM

## 2021-02-22 LAB — POC COVID19 BINAXNOW: SARS Coronavirus 2 Ag: POSITIVE — AB

## 2021-02-22 MED ORDER — MOLNUPIRAVIR EUA 200MG CAPSULE: 4.0000 | ORAL_CAPSULE | Freq: Two times a day (BID) | ORAL | 0 refills | Status: AC

## 2021-02-22 NOTE — Progress Notes (Signed)
Subjective:  Patient ID: Danielle Frazier, female    DOB: Aug 25, 1930  Age: 87 y.o. MRN: [id number]  CC: Cough  This visit occurred during the SARS-CoV-2 public health emergency.  Safety protocols were in place, including screening questions prior to the visit, additional usage of staff PPE, and extensive cleaning of exam room while observing appropriate contact time as indicated for disinfecting solutions.    HPI Danielle Frazier presents for f/up -  She complains if a several day history of NP cough and fatigue.  Outpatient Medications Prior to Visit  Medication Sig Dispense Refill   acetaminophen (TYLENOL) 500 MG tablet Take 1,000 mg by mouth as needed for mild pain or headache.      allopurinol (ZYLOPRIM) 100 MG tablet TAKE 1 TABLET BY MOUTH EVERY DAY 90 tablet 1   famotidine (PEPCID) 40 MG tablet Take 40 mg by mouth daily.     fluticasone (FLONASE) 50 MCG/ACT nasal spray Place 2 sprays into both nostrils as needed for allergies. 48 g 3   furosemide (LASIX) 40 MG tablet Take 40 mg by mouth daily.     LINZESS 145 MCG CAPS capsule TAKE 1 CAPSULE BY MOUTH EVERY DAY 90 capsule 1   Multiple Vitamins-Minerals (CENTRUM SILVER PO) Take 1 tablet by mouth daily.     nebivolol (BYSTOLIC) 10 MG tablet TAKE 1 TABLET BY MOUTH EVERY DAY 30 tablet 5   No facility-administered medications prior to visit.    ROS Review of Systems  Constitutional:  Negative for chills, diaphoresis, fatigue and fever.  HENT: Negative.  Negative for sore throat.   Eyes: Negative.   Respiratory:  Positive for cough. Negative for shortness of breath and wheezing.   Cardiovascular:  Negative for chest pain, palpitations and leg swelling.  Gastrointestinal:  Negative for abdominal pain, constipation, diarrhea, nausea and vomiting.  Endocrine: Negative.   Genitourinary: Negative.  Negative for difficulty urinating.  Musculoskeletal: Negative.   Skin: Negative.   Neurological: Negative.   Hematological:  Negative for  adenopathy. Does not bruise/bleed easily.  Psychiatric/Behavioral: Negative.     Objective:  BP (!) 146/76 (BP Location: Right Arm, Patient Position: Sitting, Cuff Size: Large)   Pulse 67   Temp 98 F (36.7 C) (Oral)   Ht 5\' 6"  (1.676 m)   Wt 164 lb (74.4 kg)   SpO2 96%   BMI 26.47 kg/m   BP Readings from Last 3 Encounters:  02/22/21 (!) 146/76  05/22/20 132/76  04/20/20 136/82    Wt Readings from Last 3 Encounters:  02/22/21 164 lb (74.4 kg)  05/22/20 156 lb 9.6 oz (71 kg)  04/20/20 154 lb (69.9 kg)    Physical Exam Vitals reviewed.  Constitutional:      General: She is not in acute distress.    Appearance: She is not ill-appearing, toxic-appearing or diaphoretic.  HENT:     Nose: Nose normal.     Mouth/Throat:     Mouth: Mucous membranes are moist.  Eyes:     General: No scleral icterus.    Conjunctiva/sclera: Conjunctivae normal.  Cardiovascular:     Rate and Rhythm: Normal rate. Rhythm regularly irregular.     Heart sounds: Murmur heard.  Systolic murmur is present with a grade of 1/6.  No diastolic murmur is present.    No gallop.  Pulmonary:     Effort: Pulmonary effort is normal.     Breath sounds: No stridor. No wheezing, rhonchi or rales.  Abdominal:     General:  Abdomen is flat.     Palpations: There is no mass.     Tenderness: There is no abdominal tenderness. There is no guarding.     Hernia: No hernia is present.  Musculoskeletal:        General: Normal range of motion.     Cervical back: Neck supple.     Right lower leg: No edema.     Left lower leg: No edema.  Lymphadenopathy:     Cervical: No cervical adenopathy.  Skin:    General: Skin is warm and dry.     Coloration: Skin is not pale.  Neurological:     General: No focal deficit present.     Mental Status: She is alert.  Psychiatric:        Mood and Affect: Mood normal.        Behavior: Behavior normal.    Lab Results  Component Value Date   WBC 6.7 04/20/2020   HGB 11.8 (L)  04/20/2020   HCT 35.5 (L) 04/20/2020   PLT 232.0 04/20/2020   GLUCOSE 89 04/20/2020   CHOL 154 11/12/2018   TRIG 123.0 11/12/2018   HDL 52.20 11/12/2018   LDLDIRECT 90.0 07/06/2015   LDLCALC 78 11/12/2018   ALT 13 12/06/2019   AST 16 12/06/2019   NA 142 04/20/2020   K 3.7 04/20/2020   CL 104 04/20/2020   CREATININE 4.89 (HH) 04/20/2020   BUN 78 (H) 04/20/2020   CO2 26 04/20/2020   TSH 2.88 01/20/2020   INR 1.2 11/28/2019   HGBA1C 5.5 06/11/2019   MICROALBUR 133.0 (H) 06/11/2019    No results found.  Assessment & Plan:   Danielle Frazier was seen today for cough.  Diagnoses and all orders for this visit:  Acute cough- COVID ag is positive. -     Cancel: SARS-COV-2 IgG; Future -     Cancel: DG Chest 2 View; Future -     POC COVID-19  Encounter for general adult medical examination with abnormal findings  Essential hypertension -     Cancel: TSH; Future  Persistent atrial fibrillation (Danielle Frazier)- She has good rate control. -     Cancel: TSH; Future  Acute bronchitis due to COVID-19 virus - She has mild symptoms. Will treat with molnupiravir. -     molnupiravir EUA (LAGEVRIO) 200 mg CAPS capsule; Take 4 capsules (800 mg total) by mouth 2 (two) times daily for 5 days.  I am having Danielle Frazier start on molnupiravir EUA. I am also having her maintain her Multiple Vitamins-Minerals (CENTRUM SILVER PO), famotidine, furosemide, acetaminophen, fluticasone, Linzess, allopurinol, and nebivolol.  Meds ordered this encounter  Medications   molnupiravir EUA (LAGEVRIO) 200 mg CAPS capsule    Sig: Take 4 capsules (800 mg total) by mouth 2 (two) times daily for 5 days.    Dispense:  40 capsule    Refill:  0     Follow-up: Return in 10 days (on 03/04/2021).  Danielle Calico, MD

## 2021-02-22 NOTE — Patient Instructions (Signed)

## 2021-03-24 ENCOUNTER — Telehealth: Payer: Self-pay | Admitting: Internal Medicine

## 2021-03-24 NOTE — Telephone Encounter (Signed)
N/A unable to leave a message for patient to call me back at [phone] to schedule Medicare Annual Wellness Visit   Last AWV  01/20/20  Please schedule at anytime with LB Speed if patient calls the office back.    40 Minutes appointment   Any questions, please call me at [phone]

## 2021-04-02 ENCOUNTER — Other Ambulatory Visit: Payer: Self-pay | Admitting: Internal Medicine

## 2021-04-02 DIAGNOSIS — K5909 Other constipation: Secondary | ICD-10-CM

## 2021-04-17 ENCOUNTER — Other Ambulatory Visit: Payer: Self-pay | Admitting: Internal Medicine

## 2021-04-17 DIAGNOSIS — M1A39X0 Chronic gout due to renal impairment, multiple sites, without tophus (tophi): Secondary | ICD-10-CM

## 2021-05-18 DIAGNOSIS — K219 Gastro-esophageal reflux disease without esophagitis: Secondary | ICD-10-CM | POA: Diagnosis not present

## 2021-05-18 DIAGNOSIS — M109 Gout, unspecified: Secondary | ICD-10-CM | POA: Diagnosis not present

## 2021-05-18 DIAGNOSIS — I4891 Unspecified atrial fibrillation: Secondary | ICD-10-CM | POA: Diagnosis not present

## 2021-05-18 DIAGNOSIS — Z8616 Personal history of COVID-19: Secondary | ICD-10-CM | POA: Diagnosis not present

## 2021-05-18 DIAGNOSIS — E1165 Type 2 diabetes mellitus with hyperglycemia: Secondary | ICD-10-CM | POA: Diagnosis not present

## 2021-05-18 DIAGNOSIS — L989 Disorder of the skin and subcutaneous tissue, unspecified: Secondary | ICD-10-CM | POA: Diagnosis not present

## 2021-05-18 DIAGNOSIS — I10 Essential (primary) hypertension: Secondary | ICD-10-CM | POA: Diagnosis not present

## 2021-05-18 DIAGNOSIS — E785 Hyperlipidemia, unspecified: Secondary | ICD-10-CM | POA: Diagnosis not present

## 2021-05-18 DIAGNOSIS — N189 Chronic kidney disease, unspecified: Secondary | ICD-10-CM | POA: Diagnosis not present

## 2021-05-18 DIAGNOSIS — I509 Heart failure, unspecified: Secondary | ICD-10-CM | POA: Diagnosis not present

## 2021-05-18 DIAGNOSIS — D631 Anemia in chronic kidney disease: Secondary | ICD-10-CM | POA: Diagnosis not present

## 2021-05-19 ENCOUNTER — Telehealth: Payer: Self-pay

## 2021-05-19 NOTE — Progress Notes (Signed)
? ? ?  Chronic Care Management ?Pharmacy Assistant  ? ?Name: Danielle Frazier  MRN: [id number] DOB: 1930-07-16 ? ?Danielle Frazier is an 88 y.o. year old female who presents for his follow-up CCM visit with the clinical pharmacist. ? ?Reason for Encounter: Disease State-Adherence  ?  ? ?Recent office visits:  ?02/22/21 [name], MD-PCP (Acute cough) Blood work ordered. Medication changes: Molnupiravir 800 mg ? ?Recent consult visits:  ?None ID ? ?Hospital visits:  ?None in previous 6 months ? ?Medications: ?Outpatient Encounter Medications as of 05/19/2021  ?Medication Sig  ? acetaminophen (TYLENOL) 500 MG tablet Take 1,000 mg by mouth as needed for mild pain or headache.   ? allopurinol (ZYLOPRIM) 100 MG tablet TAKE 1 TABLET BY MOUTH EVERY DAY  ? famotidine (PEPCID) 40 MG tablet Take 40 mg by mouth daily.  ? fluticasone (FLONASE) 50 MCG/ACT nasal spray Place 2 sprays into both nostrils as needed for allergies.  ? furosemide (LASIX) 40 MG tablet Take 40 mg by mouth daily.  ? LINZESS 145 MCG CAPS capsule TAKE 1 CAPSULE BY MOUTH EVERY DAY  ? Multiple Vitamins-Minerals (CENTRUM SILVER PO) Take 1 tablet by mouth daily.  ? nebivolol (BYSTOLIC) 10 MG tablet TAKE 1 TABLET BY MOUTH EVERY DAY  ? ?No facility-administered encounter medications on file as of 05/19/2021.  ? ?Have you had any problems recently with your health?Patient states that she is doing well and not having any issues ? ?Have you had any problems with your pharmacy?Patient states that she does not have any problems with getting medications or the cost of medications from the pharmacy ? ?What issues or side effects are you having with your medications?Patient states she does not have any side effects from medications ? ?What would you like me to pass along to [facility] Pharmacist, for them to help you with? Patient states she is doing well ? ?What can we do to take care of you better? Patient states that she does not need anything at this  time ? ?Care Gaps: ?Colonoscopy-NA ?Diabetic Foot Exam-07/11/18 ?Mammogram-NA ?Ophthalmology-03/22/18 ?Dexa Scan - NA ?Annual Well Visit - NA ?Micro albumin-06/11/19 ?Hemoglobin A1c- 01/20/21 ? ?Star Rating Drugs: ?None ID ? ?Ethelene Hal ?Clinical Pharmacist Assistant ?[phone]  ?

## 2021-05-31 DIAGNOSIS — X32XXXA Exposure to sunlight, initial encounter: Secondary | ICD-10-CM | POA: Diagnosis not present

## 2021-05-31 DIAGNOSIS — L570 Actinic keratosis: Secondary | ICD-10-CM | POA: Diagnosis not present

## 2021-05-31 DIAGNOSIS — C44212 Basal cell carcinoma of skin of right ear and external auricular canal: Secondary | ICD-10-CM | POA: Diagnosis not present

## 2021-05-31 DIAGNOSIS — C44311 Basal cell carcinoma of skin of nose: Secondary | ICD-10-CM | POA: Diagnosis not present

## 2021-05-31 DIAGNOSIS — C44319 Basal cell carcinoma of skin of other parts of face: Secondary | ICD-10-CM | POA: Diagnosis not present

## 2021-06-22 DIAGNOSIS — C4401 Basal cell carcinoma of skin of lip: Secondary | ICD-10-CM | POA: Diagnosis not present

## 2021-06-22 DIAGNOSIS — C44212 Basal cell carcinoma of skin of right ear and external auricular canal: Secondary | ICD-10-CM | POA: Diagnosis not present

## 2021-07-12 ENCOUNTER — Telehealth: Payer: Medicare Other

## 2021-07-23 DIAGNOSIS — C4491 Basal cell carcinoma of skin, unspecified: Secondary | ICD-10-CM | POA: Diagnosis not present

## 2021-07-23 DIAGNOSIS — C44319 Basal cell carcinoma of skin of other parts of face: Secondary | ICD-10-CM | POA: Diagnosis not present

## 2021-07-23 DIAGNOSIS — Z481 Encounter for planned postprocedural wound closure: Secondary | ICD-10-CM | POA: Diagnosis not present

## 2021-08-05 ENCOUNTER — Telehealth: Payer: Self-pay | Admitting: Internal Medicine

## 2021-08-05 ENCOUNTER — Other Ambulatory Visit: Payer: Self-pay | Admitting: Internal Medicine

## 2021-08-05 DIAGNOSIS — I4819 Other persistent atrial fibrillation: Secondary | ICD-10-CM

## 2021-08-05 DIAGNOSIS — I10 Essential (primary) hypertension: Secondary | ICD-10-CM

## 2021-08-05 MED ORDER — NEBIVOLOL HCL 10 MG PO TABS: 10.0000 mg | ORAL_TABLET | Freq: Every day | ORAL | 5 refills | Status: DC

## 2021-08-05 NOTE — Telephone Encounter (Signed)
Patient needs her nebivolol 10 mg. Tablet sent to [facility]  - [address] - [place]

## 2021-08-10 ENCOUNTER — Other Ambulatory Visit: Payer: Self-pay | Admitting: Internal Medicine

## 2021-08-10 DIAGNOSIS — M1A39X0 Chronic gout due to renal impairment, multiple sites, without tophus (tophi): Secondary | ICD-10-CM

## 2021-08-16 DIAGNOSIS — C44212 Basal cell carcinoma of skin of right ear and external auricular canal: Secondary | ICD-10-CM | POA: Diagnosis not present

## 2021-09-08 ENCOUNTER — Telehealth: Payer: Medicare Other

## 2021-09-10 ENCOUNTER — Other Ambulatory Visit: Payer: Self-pay | Admitting: Internal Medicine

## 2021-09-10 DIAGNOSIS — K5909 Other constipation: Secondary | ICD-10-CM

## 2021-09-24 ENCOUNTER — Encounter: Payer: Self-pay | Admitting: Cardiovascular Disease

## 2021-09-24 ENCOUNTER — Ambulatory Visit: Payer: Medicare Other | Admitting: Cardiovascular Disease

## 2021-09-24 VITALS — BP 190/88 | HR 71 | Ht 66.0 in | Wt 170.6 lb

## 2021-09-24 DIAGNOSIS — N189 Chronic kidney disease, unspecified: Secondary | ICD-10-CM | POA: Diagnosis not present

## 2021-09-24 DIAGNOSIS — I10 Essential (primary) hypertension: Secondary | ICD-10-CM | POA: Diagnosis not present

## 2021-09-24 MED ORDER — FAMOTIDINE 40 MG PO TABS: 40.0000 mg | ORAL_TABLET | Freq: Every day | ORAL | 3 refills | Status: DC

## 2021-09-24 MED ORDER — AMLODIPINE BESYLATE 5 MG PO TABS: 5.0000 mg | ORAL_TABLET | Freq: Every day | ORAL | 3 refills | Status: DC

## 2021-09-24 NOTE — Progress Notes (Signed)
Cardiology Office Note:    Date:  09/24/2021   ID:  Danielle Frazier, DOB 04/24/1930, MRN [id number]  PCP:  [name], MD  Cardiologist:  Ashkan Chamberland  Electrophysiologist:  None   Referring MD: [name], MD   No chief complaint on file.   History of Present Illness:    Danielle Frazier is a 88 y.o. female with a hx of HTN and dyspnea.and a new dx of Afib  She was anemic in march and had a transfusion. She was short of breath while she was anemic.    Walks on occasion.   Around the house and to the mailbox.   ECG in A[pril shows Afib. Cannot tell tht her HR is irreg.  Able to do all of her normal activities without any significant problems.  No chest pain.   Had some leg edema sevaral months ago but was started on  Indapamide and the edema has resolved.   No significant leg edema now   No cough, fever.  No PND or orthopena   Dec. 3, 2020:   Danielle Frazier is seen today for a follow-up visit for her atrial fibrillation hypertension.  She also has acute on chronic kidney disease. Still eats salty foods - popcorn.  Has CKD. Is seeing Dr.  Moshe Cipro for nephrology .   she  Discussed diaylsis .   Danielle Frazier does not want to have dialysis .   August 14, 2019:   Danielle Frazier is seen today for follow-up visit regarding her atrial fibrillation and hypertension.  She has acute on chronic kidney disease.  She cannot tell that her HR is irreg.  Walks with a walker for stability   Has pain in her right leg.  Pulses were checked and her distal foot pulses are bounding .    BP [place] up today   May 22, 2020 Seen for her persistent Afib  No longer on anticoagulation due to GI bleeding .  Overall she is doing well for age 82    September 24, 2021: Danielle Frazier seen today for follow-up of her hypertension, chronic kidney disease and atrial fibrillation  BP is 190 / 88 ( Creatinine is 4.4)  Adding amlodipine today    Past Medical History:  Diagnosis Date   Abdominal tenderness, RLQ (right lower quadrant)  04/24/2013   Acute bacterial sinusitis 03/22/2013   Acute bronchitis 03/24/2018   1/20   Anemia associated with acute blood loss 06/04/2018   Bilateral renal cysts 09/07/2011   BRBPR (bright red blood per rectum) 06/04/2018   Cerumen impaction 03/24/2018   1/20 L   CKD (chronic kidney disease) stage 5, GFR less than 15 ml/min (HCC) 08/25/2008   Estimated Creatinine Clearance: 12.7 mL/min (A) (by C-G formula based on SCr of 3.58 mg/dL (H)).   Constipation, chronic 04/24/2013   Diabetes mellitus    type II   DJD of shoulder 05/21/2012   DM (diabetes mellitus), type 2 with renal complications ([place]) 06/20/8117   ELECTROCARDIOGRAM, ABNORMAL 08/25/2008   Qualifier: Diagnosis of  By: Ronnald Ramp MD, Arvid Right.    Essential hypertension 08/25/2008   Gastroesophageal reflux disease without esophagitis 07/06/2015   GI bleeding 06/07/2018   Gout    GOUT 08/25/2008        Greater trochanteric bursitis of right hip 11/07/2017   Injected November 07, 2017 repeat injection Jul 13, 2018 repeat injection September 20, 2018   Hip pain, chronic, right 07/23/2012   Dg Hip Complete Right  07/23/2012   *  RADIOLOGY REPORT*  Clinical Data: Right hip pain  RIGHT HIP - COMPLETE 2+ VIEW  Comparison: None.  Findings: No fracture or dislocation is seen.  Visualized bony pelvis appears intact.  Bilateral hip joint spaces are symmetric.  Degenerative changes of the lower lumbar spine.  IMPRESSION: No acute osseous abnormality is seen.   Original Report Authenticated By:    Hyperlipidemia    Hyperlipidemia with target LDL less than 100 12/30/2009   Hypertension    Insomnia secondary to chronic pain 07/11/2018   LBP (low back pain)    foraminal narrowing at L4-5 and disc bulge at L5- S1 on MRI 6-08   Lumbar radiculopathy 12/11/2017   Morbid obesity ([place]) 03/22/2013   Otitis media 03/24/2018   1/20 R and possibly L   Persistent atrial fibrillation ([place]) 06/07/2018   Renal insufficiency    Right hip pain 10/16/2017   Shoulder injury 05/21/2012   Urinary  incontinence    Uterine fibroid 05/16/2013   UTI (lower urinary tract infection) 04/24/2013    Past Surgical History:  Procedure Laterality Date   COLONOSCOPY WITH PROPOFOL N/A 11/30/2019   Procedure: COLONOSCOPY WITH PROPOFOL;  Surgeon: [name], MD;  Location: [facility];  Service: Endoscopy;  Laterality: N/A;   ESOPHAGOGASTRODUODENOSCOPY (EGD) WITH PROPOFOL N/A 06/08/2018   Procedure: ESOPHAGOGASTRODUODENOSCOPY (EGD) WITH PROPOFOL;  Surgeon: [name], MD;  Location: [facility];  Service: Gastroenterology;  Laterality: N/A;    Current Medications: Current Meds  Medication Sig   acetaminophen (TYLENOL) 500 MG tablet Take 1,000 mg by mouth as needed for mild pain or headache.    allopurinol (ZYLOPRIM) 100 MG tablet TAKE 1 TABLET BY MOUTH EVERY DAY   famotidine (PEPCID) 40 MG tablet Take 40 mg by mouth daily.   fluticasone (FLONASE) 50 MCG/ACT nasal spray Place 2 sprays into both nostrils as needed for allergies.   furosemide (LASIX) 40 MG tablet Take 40 mg by mouth daily.   LINZESS 145 MCG CAPS capsule TAKE 1 CAPSULE BY MOUTH EVERY DAY   Multiple Vitamins-Minerals (CENTRUM SILVER PO) Take 1 tablet by mouth daily.   nebivolol (BYSTOLIC) 10 MG tablet Take 1 tablet (10 mg total) by mouth daily.     Allergies:   Metformin and Crestor [rosuvastatin]   Social History   Socioeconomic History   Marital status: Married    Spouse name: Not on file   Number of children: Not on file   Years of education: Not on file   Highest education level: Not on file  Occupational History   Not on file  Tobacco Use   Smoking status: Never   Smokeless tobacco: Never  Vaping Use   Vaping Use: Never used  Substance and Sexual Activity   Alcohol use: No   Drug use: No   Sexual activity: Not Currently  Other Topics Concern   Not on file  Social History Narrative   Not on file   Social Determinants of Health   Financial Resource Strain: Low Risk  (01/20/2020)   Overall  Financial Resource Strain (CARDIA)    Difficulty of Paying Living Expenses: Not hard at all  Food Insecurity: No Food Insecurity (01/20/2020)   Hunger Vital Sign    Worried About Running Out of Food in the Last Year: Never true    [place] in the Last Year: Never true  Transportation Needs: No Transportation Needs (01/20/2020)   PRAPARE - Transportation    Lack of Transportation (Medical): No    Lack of  Transportation (Non-Medical): No  Physical Activity: Inactive (01/20/2020)   Exercise Vital Sign    Days of Exercise per Week: 0 days    Minutes of Exercise per Session: 0 min  Stress: No Stress Concern Present (01/20/2020)   [place]    Feeling of Stress : Not at all  Social Connections: Not on file     Family History: The patient's family history includes Diabetes in an other family member; Hypertension in an other family member.  ROS:   Please see the history of present illness.     All other systems reviewed and are negative.  EKGs/Labs/Other Studies Reviewed:    The following studies were reviewed today:   Recent Labs: 01/21/2021: BUN 47; Creatinine 4.4; Hemoglobin 11.7; Potassium 4.2; Sodium 141  Recent Lipid Panel    Component Value Date/Time   CHOL 154 11/12/2018 0939   TRIG 123.0 11/12/2018 0939   HDL 52.20 11/12/2018 0939   CHOLHDL 3 11/12/2018 0939   VLDL 24.6 11/12/2018 0939   LDLCALC 78 11/12/2018 0939   LDLDIRECT 90.0 07/06/2015 0957   Physical Exam: Blood pressure (!) 190/88, pulse 71, height '5\' 6"'$  (1.676 m), weight 170 lb 9.6 oz (77.4 kg), SpO2 98 %.  GEN:  Well nourished, well developed in no acute distress HEENT: Normal NECK: No JVD; No carotid bruits LYMPHATICS: No lymphadenopathy CARDIAC: RRR , no murmurs, rubs, gallops RESPIRATORY:  Clear to auscultation without rales, wheezing or rhonchi  ABDOMEN: Soft, non-tender, non-distended MUSCULOSKELETAL:  No edema; No deformity   SKIN: Warm and dry NEUROLOGIC:  Alert and oriented x 3   ECG: September 24, 2021: Atrial fibrillation with occasional premature ventricular contractions or aberrantly conducted beats.  Heart rate is 71.  Nonspecific ST and T wave changes.  ASSESSMENT:    No diagnosis found.  PLAN:      Atrial fibrillation:  CHADS23VASC is 55   ( female, age, HTN)  Danielle Frazier has persistent atrial fibrillation.  She has had 2 significant life-threatening GI bleeds.  She is not on anticoagulation because of these GI bleeds.   2.  Acute on chronic kidney disease: Last creatinine is 4.4.  This is being managed by her primary medical doctor.  3.  Hypertension:  BP is elevated.  I suspect this is mostly because of her chronic kidney disease.  Her creatinine is 4.4.  Not sure if she is a candidate for dialysis so it may be difficult to manage her hypertension.  We will add amlodipine 5 mg a day and see if this helps.  I have cautioned her about eating any processed foods or salty foods.    Medication Adjustments/Labs and Tests Ordered: Current medicines are reviewed at length with the patient today.  Concerns regarding medicines are outlined above.  No orders of the defined types were placed in this encounter.  No orders of the defined types were placed in this encounter.   There are no Patient Instructions on file for this visit.   Signed, Mertie Moores, MD  09/24/2021 9:53 AM    [place]

## 2021-09-24 NOTE — Patient Instructions (Signed)
Medication Instructions:  START Amlodipine '5mg'$  daily  REFILLED Pepcid  *If you need a refill on your cardiac medications before your next appointment, please call your pharmacy*   Lab Work: NONE If you have labs (blood work) drawn today and your tests are completely normal, you will receive your results only by: [place] (if you have MyChart) OR A paper copy in the mail If you have any lab test that is abnormal or we need to change your treatment, we will call you to review the results.   Testing/Procedures: NONE   Follow-Up: At [facility], you and your health needs are our priority.  As part of our continuing mission to provide you with exceptional heart care, we have created designated Provider Care Teams.  These Care Teams include your primary Cardiologist (physician) and Advanced Practice Providers (APPs -  Physician Assistants and Nurse Practitioners) who all work together to provide you with the care you need, when you need it.  We recommend signing up for the patient portal called "MyChart".  Sign up information is provided on this After Visit Summary.  MyChart is used to connect with patients for Virtual Visits (Telemedicine).  Patients are able to view lab/test results, encounter notes, upcoming appointments, etc.  Non-urgent messages can be sent to your provider as well.   To learn more about what you can do with MyChart, go to [email or web address].    Your next appointment:   6 month(s)  The format for your next appointment:   In Person  Provider:   [place] or Kathlen Mody {     Important Information About Sugar

## 2022-02-17 ENCOUNTER — Encounter: Payer: Self-pay | Admitting: Internal Medicine

## 2022-02-17 ENCOUNTER — Other Ambulatory Visit: Payer: Self-pay | Admitting: Internal Medicine

## 2022-02-17 ENCOUNTER — Ambulatory Visit (INDEPENDENT_AMBULATORY_CARE_PROVIDER_SITE_OTHER): Payer: Medicare Other | Admitting: Internal Medicine

## 2022-02-17 VITALS — BP 132/84 | HR 85 | Temp 97.9°F | Ht 66.0 in | Wt 170.0 lb

## 2022-02-17 DIAGNOSIS — M1A39X0 Chronic gout due to renal impairment, multiple sites, without tophus (tophi): Secondary | ICD-10-CM

## 2022-02-17 DIAGNOSIS — Z23 Encounter for immunization: Secondary | ICD-10-CM | POA: Diagnosis not present

## 2022-02-17 DIAGNOSIS — N185 Chronic kidney disease, stage 5: Secondary | ICD-10-CM | POA: Diagnosis not present

## 2022-02-17 DIAGNOSIS — I4819 Other persistent atrial fibrillation: Secondary | ICD-10-CM

## 2022-02-17 DIAGNOSIS — N281 Cyst of kidney, acquired: Secondary | ICD-10-CM | POA: Diagnosis not present

## 2022-02-17 DIAGNOSIS — M1A9XX0 Chronic gout, unspecified, without tophus (tophi): Secondary | ICD-10-CM

## 2022-02-17 DIAGNOSIS — D631 Anemia in chronic kidney disease: Secondary | ICD-10-CM | POA: Diagnosis not present

## 2022-02-17 DIAGNOSIS — Z0000 Encounter for general adult medical examination without abnormal findings: Secondary | ICD-10-CM

## 2022-02-17 DIAGNOSIS — K219 Gastro-esophageal reflux disease without esophagitis: Secondary | ICD-10-CM

## 2022-02-17 DIAGNOSIS — I10 Essential (primary) hypertension: Secondary | ICD-10-CM

## 2022-02-17 LAB — BASIC METABOLIC PANEL
BUN: 45 mg/dL — ABNORMAL HIGH (ref 6–23)
CO2: 23 mEq/L (ref 19–32)
Calcium: 10.1 mg/dL (ref 8.4–10.5)
Chloride: 108 mEq/L (ref 96–112)
Creatinine, Ser: 4.33 mg/dL — ABNORMAL HIGH (ref 0.40–1.20)
GFR: 8.64 mL/min — CL (ref >60.00)
Glucose, Bld: 97 mg/dL (ref 70–99)
Potassium: 4 mEq/L (ref 3.5–5.1)
Sodium: 142 mEq/L (ref 135–145)

## 2022-02-17 LAB — HEPATIC FUNCTION PANEL
ALT: 8 U/L (ref 0–35)
AST: 16 U/L (ref 0–37)
Albumin: 3.9 g/dL (ref 3.5–5.2)
Alkaline Phosphatase: 229 U/L — ABNORMAL HIGH (ref 39–117)
Bilirubin, Direct: 0.1 mg/dL (ref 0.0–0.3)
Total Bilirubin: 0.5 mg/dL (ref 0.2–1.2)
Total Protein: 6.8 g/dL (ref 6.0–8.3)

## 2022-02-17 LAB — CBC WITH DIFFERENTIAL/PLATELET
Basophils Absolute: 0 10*3/uL (ref 0.0–0.1)
Basophils Relative: 0.6 % (ref 0.0–3.0)
Eosinophils Absolute: 0.2 10*3/uL (ref 0.0–0.7)
Eosinophils Relative: 2.7 % (ref 0.0–5.0)
HCT: 35.3 % — ABNORMAL LOW (ref 36.0–46.0)
Hemoglobin: 11.1 g/dL — ABNORMAL LOW (ref 12.0–15.0)
Lymphocytes Relative: 26.2 % (ref 12.0–46.0)
Lymphs Abs: 1.8 10*3/uL (ref 0.7–4.0)
MCHC: 31.4 g/dL (ref 30.0–36.0)
MCV: 101.4 fl — ABNORMAL HIGH (ref 78.0–100.0)
Monocytes Absolute: 0.6 10*3/uL (ref 0.1–1.0)
Monocytes Relative: 9 % (ref 3.0–12.0)
Neutro Abs: 4.2 10*3/uL (ref 1.4–7.7)
Neutrophils Relative %: 61.5 % (ref 43.0–77.0)
Platelets: 222 10*3/uL (ref 150.0–400.0)
RBC: 3.48 Mil/uL — ABNORMAL LOW (ref 3.87–5.11)
RDW: 15 % (ref 11.5–15.5)
WBC: 6.9 10*3/uL (ref 4.0–10.5)

## 2022-02-17 LAB — URINALYSIS, ROUTINE W REFLEX MICROSCOPIC
Bilirubin Urine: NEGATIVE
Ketones, ur: NEGATIVE
Leukocytes,Ua: NEGATIVE
Nitrite: NEGATIVE
Specific Gravity, Urine: 1.02 (ref 1.000–1.030)
Total Protein, Urine: >=300 — AB
Urine Glucose: NEGATIVE
Urobilinogen, UA: 0.2 (ref 0.0–1.0)
pH: 6 (ref 5.0–8.0)

## 2022-02-17 LAB — TSH: TSH: 3.33 u[IU]/mL (ref 0.35–5.50)

## 2022-02-17 LAB — BRAIN NATRIURETIC PEPTIDE: Pro B Natriuretic peptide (BNP): 1155 pg/mL — ABNORMAL HIGH (ref 0.0–100.0)

## 2022-02-17 LAB — URIC ACID: Uric Acid, Serum: 5.6 mg/dL (ref 2.4–7.0)

## 2022-02-17 MED ORDER — NEBIVOLOL HCL 10 MG PO TABS: 10.0000 mg | ORAL_TABLET | Freq: Every day | ORAL | 1 refills | Status: DC

## 2022-02-17 MED ORDER — ALLOPURINOL 100 MG PO TABS: 100.0000 mg | ORAL_TABLET | Freq: Every day | ORAL | 1 refills | Status: DC

## 2022-02-17 NOTE — Progress Notes (Signed)
Subjective:  Patient ID: Danielle Frazier, female    DOB: March 30, 1930  Age: 88 y.o. MRN: [id number]  CC: Annual Exam   HPI [name] presents for a CPX and f/up -  She has her baseline shortness of breath and lower extremity edema.  She denies chest pain, palpitations, or near syncope.  She is not taking Lasix or Pepcid because she thought it was contributing to intermittent bright red blood per rectum.  Outpatient Medications Prior to Visit  Medication Sig Dispense Refill   acetaminophen (TYLENOL) 500 MG tablet Take 1,000 mg by mouth as needed for mild pain or headache.      amLODipine (NORVASC) 5 MG tablet Take 1 tablet (5 mg total) by mouth daily. 90 tablet 3   famotidine (PEPCID) 40 MG tablet Take 1 tablet (40 mg total) by mouth daily. 90 tablet 3   fluticasone (FLONASE) 50 MCG/ACT nasal spray Place 2 sprays into both nostrils as needed for allergies. 48 g 3   furosemide (LASIX) 40 MG tablet Take 40 mg by mouth daily.     LINZESS 145 MCG CAPS capsule TAKE 1 CAPSULE BY MOUTH EVERY DAY 90 capsule 1   Multiple Vitamins-Minerals (CENTRUM SILVER PO) Take 1 tablet by mouth daily.     allopurinol (ZYLOPRIM) 100 MG tablet TAKE 1 TABLET BY MOUTH EVERY DAY 90 tablet 1   nebivolol (BYSTOLIC) 10 MG tablet Take 1 tablet (10 mg total) by mouth daily. 30 tablet 5   No facility-administered medications prior to visit.    ROS Review of Systems  Constitutional:  Positive for fatigue. Negative for chills, diaphoresis and unexpected weight change.  HENT: Negative.    Eyes: Negative.   Respiratory:  Positive for shortness of breath. Negative for cough, chest tightness and wheezing.   Cardiovascular:  Positive for leg swelling. Negative for chest pain and palpitations.  Gastrointestinal:  Positive for anal bleeding. Negative for abdominal pain, blood in stool, diarrhea, nausea and vomiting.  Endocrine: Negative.   Genitourinary: Negative.   Musculoskeletal:  Positive for arthralgias and gait  problem. Negative for myalgias.  Neurological:  Negative for dizziness, weakness and light-headedness.  Hematological:  Negative for adenopathy. Does not bruise/bleed easily.  Psychiatric/Behavioral: Negative.      Objective:  BP 132/84 (BP Location: Right Arm, Patient Position: Sitting, Cuff Size: Large)   Pulse 85   Temp 97.9 F (36.6 C) (Oral)   Ht '5\' 6"'$  (1.676 m)   Wt 170 lb (77.1 kg)   SpO2 95%   BMI 27.44 kg/m   BP Readings from Last 3 Encounters:  02/17/22 132/84  09/24/21 (!) 190/88  02/22/21 (!) 146/76    Wt Readings from Last 3 Encounters:  02/17/22 170 lb (77.1 kg)  09/24/21 170 lb 9.6 oz (77.4 kg)  02/22/21 164 lb (74.4 kg)    Physical Exam Vitals reviewed.  HENT:     Nose: Nose normal.     Mouth/Throat:     Mouth: Mucous membranes are moist.  Eyes:     General: No scleral icterus.    Conjunctiva/sclera: Conjunctivae normal.  Cardiovascular:     Rate and Rhythm: Normal rate. Rhythm irregularly irregular.     Heart sounds: Normal heart sounds, S1 normal and S2 normal. No murmur heard.    No friction rub. No gallop.     Comments: EKG-- A fib with PVC, 72 bpm Minimal LVH Anterior infarct pattern is old unchanged Pulmonary:     Breath sounds: No stridor. No wheezing,  rhonchi or rales.  Abdominal:     General: Abdomen is flat.     Palpations: There is no mass.     Tenderness: There is no abdominal tenderness. There is no guarding.     Hernia: No hernia is present.  Musculoskeletal:     Cervical back: Neck supple.     Right lower leg: 1+ Pitting Edema present.     Left lower leg: 1+ Pitting Edema present.  Skin:    General: Skin is warm and dry.     Findings: No lesion.  Neurological:     General: No focal deficit present.     Mental Status: She is alert.  Psychiatric:        Mood and Affect: Mood normal.        Behavior: Behavior normal.     Lab Results  Component Value Date   WBC 6.9 02/17/2022   HGB 11.1 (L) 02/17/2022   HCT 35.3 (L)  02/17/2022   PLT 222.0 02/17/2022   GLUCOSE 97 02/17/2022   CHOL 154 11/12/2018   TRIG 123.0 11/12/2018   HDL 52.20 11/12/2018   LDLDIRECT 90.0 07/06/2015   LDLCALC 78 11/12/2018   ALT 8 02/17/2022   AST 16 02/17/2022   NA 142 02/17/2022   K 4.0 02/17/2022   CL 108 02/17/2022   CREATININE 4.33 (H) 02/17/2022   BUN 45 (H) 02/17/2022   CO2 23 02/17/2022   TSH 3.33 02/17/2022   INR 1.2 11/28/2019   HGBA1C 5.5 06/11/2019   MICROALBUR 133.0 (H) 06/11/2019    No results found.  Assessment & Plan:   [name] was seen today for annual exam.  Diagnoses and all orders for this visit:  Essential hypertension- Her blood pressure is adequately well-controlled. -     EKG 12-Lead -     TSH; Future -     Urinalysis, Routine w reflex microscopic; Future -     Hepatic function panel; Future -     CBC with Differential/Platelet; Future -     Basic metabolic panel; Future -     Brain natriuretic peptide; Future -     Brain natriuretic peptide -     Basic metabolic panel -     CBC with Differential/Platelet -     Hepatic function panel -     Urinalysis, Routine w reflex microscopic -     TSH -     nebivolol (BYSTOLIC) 10 MG tablet; Take 1 tablet (10 mg total) by mouth daily.  Persistent atrial fibrillation ([place])- She has good rate control. -     TSH; Future -     Hepatic function panel; Future -     Brain natriuretic peptide; Future -     Brain natriuretic peptide -     Hepatic function panel -     TSH -     nebivolol (BYSTOLIC) 10 MG tablet; Take 1 tablet (10 mg total) by mouth daily.  CKD (chronic kidney disease) stage 5, GFR less than 15 ml/min (HCC)- Renal function is stable. -     Urinalysis, Routine w reflex microscopic; Future -     Hepatic function panel; Future -     Basic metabolic panel; Future -     Basic metabolic panel -     Hepatic function panel -     Urinalysis, Routine w reflex microscopic  Routine general medical examination at a health care facility- Exam  completed, labs reviewed, vaccines reviewed and updated, no cancer screenings indicated,  patient education was given.  Anemia due to stage 5 chronic kidney disease ([place])- H&H are stable. -     Hepatic function panel; Future -     Hepatic function panel  Chronic gout without tophus, unspecified cause, unspecified site -     Uric acid; Future -     Uric acid -     allopurinol (ZYLOPRIM) 100 MG tablet; Take 1 tablet (100 mg total) by mouth daily.  Bilateral renal cysts -     Urinalysis, Routine w reflex microscopic; Future -     Urinalysis, Routine w reflex microscopic  Gastroesophageal reflux disease without esophagitis  Flu vaccine need -     Flu Vaccine QUAD High Dose(Fluad)  Other orders -     Pneumococcal polysaccharide vaccine 23-valent greater than or equal to 2yo subcutaneous/IM   I have changed Zhara C. [place]elstadt's allopurinol. I am also having her maintain her Multiple Vitamins-Minerals (CENTRUM SILVER PO), furosemide, acetaminophen, fluticasone, Linzess, famotidine, amLODipine, and nebivolol.  Meds ordered this encounter  Medications   allopurinol (ZYLOPRIM) 100 MG tablet    Sig: Take 1 tablet (100 mg total) by mouth daily.    Dispense:  90 tablet    Refill:  1   nebivolol (BYSTOLIC) 10 MG tablet    Sig: Take 1 tablet (10 mg total) by mouth daily.    Dispense:  90 tablet    Refill:  1     Follow-up: Return in about 6 months (around 08/19/2022).  [name], MD

## 2022-02-17 NOTE — Patient Instructions (Signed)

## 2022-02-18 DIAGNOSIS — Z23 Encounter for immunization: Secondary | ICD-10-CM | POA: Insufficient documentation

## 2022-04-19 ENCOUNTER — Encounter: Payer: Self-pay | Admitting: Internal Medicine

## 2022-04-19 NOTE — Progress Notes (Unsigned)
    Subjective:    Patient ID: Danielle Frazier, female    DOB: 1930-09-19, 89 y.o.   MRN: [id number]      HPI Danielle Frazier is here for No chief complaint on file.     Leg swelling x 1 week-no pain   She did have some swelling last year and at that time was started on indapamide and per cardiology when he saw her in July the swelling had improved.  Persistent afibrillation-not on anticoagulation due to history of GI bleed  CKD stage V-follows with nephrology.  Does not want hemodialysis.  Medications and allergies reviewed with patient and updated if appropriate.  Current Outpatient Medications on File Prior to Visit  Medication Sig Dispense Refill   acetaminophen (TYLENOL) 500 MG tablet Take 1,000 mg by mouth as needed for mild pain or headache.      allopurinol (ZYLOPRIM) 100 MG tablet Take 1 tablet (100 mg total) by mouth daily. 90 tablet 1   amLODipine (NORVASC) 5 MG tablet Take 1 tablet (5 mg total) by mouth daily. 90 tablet 3   famotidine (PEPCID) 40 MG tablet Take 1 tablet (40 mg total) by mouth daily. 90 tablet 3   fluticasone (FLONASE) 50 MCG/ACT nasal spray Place 2 sprays into both nostrils as needed for allergies. 48 g 3   furosemide (LASIX) 40 MG tablet Take 40 mg by mouth daily.     LINZESS 145 MCG CAPS capsule TAKE 1 CAPSULE BY MOUTH EVERY DAY 90 capsule 1   Multiple Vitamins-Minerals (CENTRUM SILVER PO) Take 1 tablet by mouth daily.     nebivolol (BYSTOLIC) 10 MG tablet Take 1 tablet (10 mg total) by mouth daily. 90 tablet 1   No current facility-administered medications on file prior to visit.    Review of Systems     Objective:  There were no vitals filed for this visit. BP Readings from Last 3 Encounters:  02/17/22 132/84  09/24/21 (!) 190/88  02/22/21 (!) 146/76   Wt Readings from Last 3 Encounters:  02/17/22 170 lb (77.1 kg)  09/24/21 170 lb 9.6 oz (77.4 kg)  02/22/21 164 lb (74.4 kg)   There is no height or weight on file to calculate BMI.     Physical Exam         Assessment & Plan:    See Problem List for Assessment and Plan of chronic medical problems.

## 2022-04-19 NOTE — Patient Instructions (Signed)
      Blood work was ordered.   The lab is on the first floor.    Medications changes include :       A referral was ordered for XXX.     Someone will call you to schedule an appointment.    No follow-ups on file.

## 2022-04-20 ENCOUNTER — Ambulatory Visit (HOSPITAL_COMMUNITY)
Admission: RE | Admit: 2022-04-20 | Discharge: 2022-04-20 | Disposition: A | Discharge status: Home / Self Care | Payer: Medicare Other | Source: Ambulatory Visit | Attending: Internal Medicine | Admitting: Internal Medicine

## 2022-04-20 ENCOUNTER — Ambulatory Visit (INDEPENDENT_AMBULATORY_CARE_PROVIDER_SITE_OTHER): Payer: Medicare Other | Admitting: Internal Medicine

## 2022-04-20 VITALS — BP 140/86 | HR 72 | Temp 97.9°F | Ht 66.0 in | Wt 173.0 lb

## 2022-04-20 DIAGNOSIS — N185 Chronic kidney disease, stage 5: Secondary | ICD-10-CM | POA: Diagnosis not present

## 2022-04-20 DIAGNOSIS — M7989 Other specified soft tissue disorders: Secondary | ICD-10-CM | POA: Insufficient documentation

## 2022-06-21 ENCOUNTER — Telehealth: Payer: Self-pay | Admitting: Internal Medicine

## 2022-06-21 MED ORDER — FUROSEMIDE 40 MG PO TABS: 40.0000 mg | ORAL_TABLET | Freq: Every day | ORAL | 1 refills | Status: DC

## 2022-06-21 NOTE — Telephone Encounter (Signed)
Prescription Request  06/21/2022  LOV: 02/17/2022  What is the name of the medication or equipment? furosemide  Have you contacted your pharmacy to request a refill? Yes   Which pharmacy would you like this sent to?  [id number] [place], [place] - [address] [place] [address] Phone: [phone] Fax: [phone]    Patient notified that their request is being sent to the clinical staff for review and that they should receive a response within 2 business days.   Please advise at Mobile [phone] (mobile)

## 2022-08-06 ENCOUNTER — Other Ambulatory Visit: Payer: Self-pay | Admitting: Cardiovascular Disease

## 2022-08-06 DIAGNOSIS — N189 Chronic kidney disease, unspecified: Secondary | ICD-10-CM

## 2022-08-06 DIAGNOSIS — I10 Essential (primary) hypertension: Secondary | ICD-10-CM

## 2022-09-05 ENCOUNTER — Other Ambulatory Visit: Payer: Self-pay | Admitting: Internal Medicine

## 2022-10-10 ENCOUNTER — Other Ambulatory Visit: Payer: Self-pay | Admitting: Internal Medicine

## 2022-10-10 DIAGNOSIS — I10 Essential (primary) hypertension: Secondary | ICD-10-CM

## 2022-10-10 DIAGNOSIS — I4819 Other persistent atrial fibrillation: Secondary | ICD-10-CM

## 2022-10-21 ENCOUNTER — Ambulatory Visit: Payer: Medicare Other

## 2022-10-21 VITALS — Ht 66.0 in | Wt 157.0 lb

## 2022-10-21 DIAGNOSIS — M1A9XX0 Chronic gout, unspecified, without tophus (tophi): Secondary | ICD-10-CM

## 2022-10-21 DIAGNOSIS — Z0000 Encounter for general adult medical examination without abnormal findings: Secondary | ICD-10-CM | POA: Diagnosis not present

## 2022-10-21 MED ORDER — FUROSEMIDE 40 MG PO TABS: 40.0000 mg | ORAL_TABLET | Freq: Every day | ORAL | 1 refills | Status: DC

## 2022-10-21 MED ORDER — ALLOPURINOL 100 MG PO TABS: 100.0000 mg | ORAL_TABLET | Freq: Every day | ORAL | 1 refills | Status: DC

## 2022-10-21 NOTE — Patient Instructions (Signed)
Danielle Frazier , Thank you for taking time to come for your Medicare Wellness Visit. I appreciate your ongoing commitment to your health goals. Please review the following plan we discussed and let me know if I can assist you in the future.   Referrals/Orders/Follow-Ups/Clinician Recommendations: I have put in a refill request for medications needed.  Remember you soon will need your Flu vaccine for this season.  It was nice talking to you and keep up the good work.  Each day, aim for 6 glasses of water, plenty of protein in your diet and try to get up and walk/ stretch every hour for 5-10 minutes at a time.    This is a list of the screening recommended for you and due dates:  Health Maintenance  Topic Date Due   Zoster (Shingles) Vaccine (1 of 2) Never done   Complete foot exam   07/11/2019   Hemoglobin A1C  12/12/2019   Flu Shot  10/13/2022   Eye exam for diabetics  05/11/2023   Medicare Annual Wellness Visit  10/21/2023   DTaP/Tdap/Td vaccine (3 - Td or Tdap) 11/11/2028   Pneumonia Vaccine  Completed   DEXA scan (bone density measurement)  Completed   HPV Vaccine  Aged Out   COVID-19 Vaccine  Discontinued    Advanced directives: (Copy Requested) Please bring a copy of your health care power of attorney and living will to the office to be added to your chart at your convenience.  Next Medicare Annual Wellness Visit scheduled for next year: No  Preventive Care 40 Years and Older, Female Preventive care refers to lifestyle choices and visits with your health care provider that can promote health and wellness. What does preventive care include? A yearly physical exam. This is also called an annual well check. Dental exams once or twice a year. Routine eye exams. Ask your health care provider how often you should have your eyes checked. Personal lifestyle choices, including: Daily care of your teeth and gums. Regular physical activity. Eating a healthy diet. Avoiding tobacco and drug  use. Limiting alcohol use. Practicing safe sex. Taking low-dose aspirin every day. Taking vitamin and mineral supplements as recommended by your health care provider. What happens during an annual well check? The services and screenings done by your health care provider during your annual well check will depend on your age, overall health, lifestyle risk factors, and family history of disease. Counseling  Your health care provider may ask you questions about your: Alcohol use. Tobacco use. Drug use. Emotional well-being. Home and relationship well-being. Sexual activity. Eating habits. History of falls. Memory and ability to understand (cognition). Work and work Astronomer. Reproductive health. Screening  You may have the following tests or measurements: Height, weight, and BMI. Blood pressure. Lipid and cholesterol levels. These may be checked every 5 years, or more frequently if you are over 1 years old. Skin check. Lung cancer screening. You may have this screening every year starting at age 64 if you have a 30-pack-year history of smoking and currently smoke or have quit within the past 15 years. Fecal occult blood test (FOBT) of the stool. You may have this test every year starting at age 14. Flexible sigmoidoscopy or colonoscopy. You may have a sigmoidoscopy every 5 years or a colonoscopy every 10 years starting at age 53. Hepatitis C blood test. Hepatitis B blood test. Sexually transmitted disease (STD) testing. Diabetes screening. This is done by checking your blood sugar (glucose) after you have not eaten  for a while (fasting). You may have this done every 1-3 years. Bone density scan. This is done to screen for osteoporosis. You may have this done starting at age 25. Mammogram. This may be done every 1-2 years. Talk to your health care provider about how often you should have regular mammograms. Talk with your health care provider about your test results, treatment  options, and if necessary, the need for more tests. Vaccines  Your health care provider may recommend certain vaccines, such as: Influenza vaccine. This is recommended every year. Tetanus, diphtheria, and acellular pertussis (Tdap, Td) vaccine. You may need a Td booster every 10 years. Zoster vaccine. You may need this after age 38. Pneumococcal 13-valent conjugate (PCV13) vaccine. One dose is recommended after age 74. Pneumococcal polysaccharide (PPSV23) vaccine. One dose is recommended after age 31. Talk to your health care provider about which screenings and vaccines you need and how often you need them. This information is not intended to replace advice given to you by your health care provider. Make sure you discuss any questions you have with your health care provider. Document Released: 03/27/2015 Document Revised: 11/18/2015 Document Reviewed: 12/30/2014 Elsevier Interactive Patient Education  2017 [employer].  Fall Prevention in the Home Falls can cause injuries. They can happen to people of all ages. There are many things you can do to make your home safe and to help prevent falls. What can I do on the outside of my home? Regularly fix the edges of walkways and driveways and fix any cracks. Remove anything that might make you trip as you walk through a door, such as a raised step or threshold. Trim any bushes or trees on the path to your home. Use bright outdoor lighting. Clear any walking paths of anything that might make someone trip, such as rocks or tools. Regularly check to see if handrails are loose or broken. Make sure that both sides of any steps have handrails. Any raised decks and porches should have guardrails on the edges. Have any leaves, snow, or ice cleared regularly. Use sand or salt on walking paths during winter. Clean up any spills in your garage right away. This includes oil or grease spills. What can I do in the bathroom? Use night lights. Install grab  bars by the toilet and in the tub and shower. Do not use towel bars as grab bars. Use non-skid mats or decals in the tub or shower. If you need to sit down in the shower, use a plastic, non-slip stool. Keep the floor dry. Clean up any water that spills on the floor as soon as it happens. Remove soap buildup in the tub or shower regularly. Attach bath mats securely with double-sided non-slip rug tape. Do not have throw rugs and other things on the floor that can make you trip. What can I do in the bedroom? Use night lights. Make sure that you have a light by your bed that is easy to reach. Do not use any sheets or blankets that are too big for your bed. They should not hang down onto the floor. Have a firm chair that has side arms. You can use this for support while you get dressed. Do not have throw rugs and other things on the floor that can make you trip. What can I do in the kitchen? Clean up any spills right away. Avoid walking on wet floors. Keep items that you use a lot in easy-to-reach places. If you need to reach something  above you, use a strong step stool that has a grab bar. Keep electrical cords out of the way. Do not use floor polish or wax that makes floors slippery. If you must use wax, use non-skid floor wax. Do not have throw rugs and other things on the floor that can make you trip. What can I do with my stairs? Do not leave any items on the stairs. Make sure that there are handrails on both sides of the stairs and use them. Fix handrails that are broken or loose. Make sure that handrails are as long as the stairways. Check any carpeting to make sure that it is firmly attached to the stairs. Fix any carpet that is loose or worn. Avoid having throw rugs at the top or bottom of the stairs. If you do have throw rugs, attach them to the floor with carpet tape. Make sure that you have a light switch at the top of the stairs and the bottom of the stairs. If you do not have them,  ask someone to add them for you. What else can I do to help prevent falls? Wear shoes that: Do not have high heels. Have rubber bottoms. Are comfortable and fit you well. Are closed at the toe. Do not wear sandals. If you use a stepladder: Make sure that it is fully opened. Do not climb a closed stepladder. Make sure that both sides of the stepladder are locked into place. Ask someone to hold it for you, if possible. Clearly mark and make sure that you can see: Any grab bars or handrails. First and last steps. Where the edge of each step is. Use tools that help you move around (mobility aids) if they are needed. These include: Canes. Walkers. Scooters. Crutches. Turn on the lights when you go into a dark area. Replace any light bulbs as soon as they burn out. Set up your furniture so you have a clear path. Avoid moving your furniture around. If any of your floors are uneven, fix them. If there are any pets around you, be aware of where they are. Review your medicines with your doctor. Some medicines can make you feel dizzy. This can increase your chance of falling. Ask your doctor what other things that you can do to help prevent falls. This information is not intended to replace advice given to you by your health care provider. Make sure you discuss any questions you have with your health care provider. Document Released: 12/25/2008 Document Revised: 08/06/2015 Document Reviewed: 04/04/2014 Elsevier Interactive Patient Education  2017 [employer].

## 2022-10-21 NOTE — Progress Notes (Signed)
Subjective:   Danielle Frazier is a 89 y.o. female who presents for Medicare Annual (Subsequent) preventive examination.  Visit Complete: Virtual  I connected with  Danielle Frazier on 10/21/22 by a audio enabled telemedicine application and verified that I am speaking with the correct person using two identifiers.  Patient Location: Home  Provider Location: Home Office  I discussed the limitations of evaluation and management by telemedicine. The patient expressed understanding and agreed to proceed.  Vital Signs: Vital signs are patient reported.   Review of Systems    Cardiac Risk Factors include: advanced age (>63men, >60 women);dyslipidemia;hypertension;Other (see comment), Risk factor comments: CKD     Objective:    Today's Vitals   10/21/22 1307  Weight: 157 lb (71.2 kg)  Height: 5\' 6"  (1.676 m)   Body mass index is 25.34 kg/m.     10/21/2022    1:13 PM 01/20/2020   11:46 AM 11/28/2019    2:28 PM 11/28/2019    5:04 AM 06/07/2018    1:28 PM 06/07/2018   12:07 PM 07/06/2015    4:53 PM  Advanced Directives  Does Patient Have a Medical Advance Directive? Yes No  No No No Yes  Type of Estate agent of [place];Living will      Healthcare Power of [place];Living will  Does patient want to make changes to medical advance directive?       No - Patient declined  Copy of Healthcare Power of Attorney in Chart? No - copy requested      Yes  Would patient like information on creating a medical advance directive?  No - Patient declined No - Patient declined  No - Patient declined      Current Medications (verified) Outpatient Encounter Medications as of 10/21/2022  Medication Sig   acetaminophen (TYLENOL) 500 MG tablet Take 1,000 mg by mouth as needed for mild pain or headache.    allopurinol (ZYLOPRIM) 100 MG tablet Take 1 tablet (100 mg total) by mouth daily.   amLODipine (NORVASC) 5 MG tablet Take 1 tablet (5 mg total) by mouth daily.   famotidine (PEPCID) 40  MG tablet Take 1 tablet (40 mg total) by mouth daily.   fluticasone (FLONASE) 50 MCG/ACT nasal spray Place 2 sprays into both nostrils as needed for allergies.   furosemide (LASIX) 40 MG tablet Take 1 tablet (40 mg total) by mouth daily.   LINZESS 145 MCG CAPS capsule TAKE 1 CAPSULE BY MOUTH EVERY DAY   Multiple Vitamins-Minerals (CENTRUM SILVER PO) Take 1 tablet by mouth daily.   nebivolol (BYSTOLIC) 10 MG tablet Take 1 tablet (10 mg total) by mouth daily.   No facility-administered encounter medications on file as of 10/21/2022.    Allergies (verified) Metformin and Crestor [rosuvastatin]   History: Past Medical History:  Diagnosis Date   Abdominal tenderness, RLQ (right lower quadrant) 04/24/2013   Acute bacterial sinusitis 03/22/2013   Acute bronchitis 03/24/2018   1/20   Anemia associated with acute blood loss 06/04/2018   Bilateral renal cysts 09/07/2011   BRBPR (bright red blood per rectum) 06/04/2018   Cerumen impaction 03/24/2018   1/20 L   CKD (chronic kidney disease) stage 5, GFR less than 15 ml/min (HCC) 08/25/2008   Estimated Creatinine Clearance: 12.7 mL/min (A) (by C-G formula based on SCr of 3.58 mg/dL (H)).   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    Essential hypertension 08/25/2008   Gastroesophageal reflux disease without esophagitis 07/06/2015   GI bleeding 06/07/2018   Gout    GOUT 08/25/2008        Greater trochanteric bursitis of right hip 11/07/2017   Injected November 07, 2017 repeat injection Jul 13, 2018 repeat injection September 20, 2018   Hip pain, chronic, right 07/23/2012   Dg Hip Complete Right  07/23/2012   *RADIOLOGY REPORT*  Clinical Data: Right hip pain  RIGHT HIP - COMPLETE 2+ VIEW  Comparison: None.  Findings: No fracture or dislocation is seen.   Visualized bony pelvis appears intact.  Bilateral hip joint spaces are symmetric.  Degenerative changes of the lower lumbar spine.  IMPRESSION: No acute osseous abnormality is seen.   Original Report Authenticated By:    Hyperlipidemia    Hyperlipidemia with target LDL less than 100 12/30/2009   Hypertension    Insomnia secondary to chronic pain 07/11/2018   LBP (low back pain)    foraminal narrowing at L4-5 and disc bulge at L5- S1 on MRI 6-08   Lumbar radiculopathy 12/11/2017   Morbid obesity (HCC) 03/22/2013   Otitis media 03/24/2018   1/20 R and possibly L   Persistent atrial fibrillation (HCC) 06/07/2018   Renal insufficiency    Right hip pain 10/16/2017   Shoulder injury 05/21/2012   Urinary incontinence    Uterine fibroid 05/16/2013   UTI (lower urinary tract infection) 04/24/2013   Past Surgical History:  Procedure Laterality Date   COLONOSCOPY WITH PROPOFOL N/A 11/30/2019   Procedure: COLONOSCOPY WITH PROPOFOL;  Surgeon: Napoleon Form, MD;  Location: [facility];  Service: Endoscopy;  Laterality: N/A;   ESOPHAGOGASTRODUODENOSCOPY (EGD) WITH PROPOFOL N/A 06/08/2018   Procedure: ESOPHAGOGASTRODUODENOSCOPY (EGD) WITH PROPOFOL;  Surgeon: Tressia Danas, MD;  Location: [facility];  Service: Gastroenterology;  Laterality: N/A;   Family History  Problem Relation Age of Onset   Diabetes Other    Hypertension Other    Social History   Socioeconomic History   Marital status: Married    Spouse name: [name]   Number of children: 0   Years of education: Not on file   Highest education level: Not on file  Occupational History   Not on file  Tobacco Use   Smoking status: Never   Smokeless tobacco: Never  Vaping Use   Vaping status: Never Used  Substance and Sexual Activity   Alcohol use: No   Drug use: No   Sexual activity: Not Currently  Other Topics Concern   Not on file  Social History Narrative   Not on file   Social Determinants of Health   Financial Resource  Strain: Low Risk  (01/20/2020)   Overall Financial Resource Strain (CARDIA)    Difficulty of Paying Living Expenses: Not hard at all  Food Insecurity: No Food Insecurity (01/20/2020)   Hunger Vital Sign    Worried About Running Out of Food in the Last Year: Never true    Ran Out of Food in the Last Year: Never true  Transportation Needs: No Transportation Needs (01/20/2020)   PRAPARE - Administrator, Civil Service (Medical): No    Lack of Transportation (Non-Medical): No  Physical Activity: Inactive (01/20/2020)   Exercise Vital Sign    Days of Exercise per Week: 0 days    Minutes of Exercise per Session:  0 min  Stress: No Stress Concern Present (01/20/2020)   [employer] of Occupational Health - Occupational Stress Questionnaire    Feeling of Stress : Not at all  Social Connections: Not on file    Tobacco Counseling Counseling given: Not Answered   Clinical Intake:  Pre-visit preparation completed: Yes  Pain : No/denies pain     BMI - recorded: 25.34 Nutritional Status: BMI 25 -29 Overweight Nutritional Risks: None Diabetes: No  How often do you need to have someone help you when you read instructions, pamphlets, or other written materials from your doctor or pharmacy?: 1 - Never  Interpreter Needed?: No  Information entered by ::  , RMA   Activities of Daily Living    10/21/2022    1:09 PM  In your present state of health, do you have any difficulty performing the following activities:  Hearing? 0  Vision? 0  Difficulty concentrating or making decisions? 0  Walking or climbing stairs? 0  Dressing or bathing? 1  Doing errands, shopping? 1  Comment Her husband drives her.  Preparing Food and eating ? N  Using the Toilet? N  In the past six months, have you accidently leaked urine? Y  Do you have problems with loss of bowel control? N  Managing your Medications? N  Managing your Finances? N  Housekeeping or managing your  Housekeeping? N    Patient Care Team: Etta Grandchild, MD as PCP - General Nahser, Deloris Ping, MD as PCP - Cardiology (Cardiology) Kathyrn Sheriff, [facility] (Inactive) as Pharmacist (Pharmacist)  Indicate any recent Medical Services you may have received from other than [facility] providers in the past year (date may be approximate).     Assessment:   This is a routine wellness examination for Danielle Frazier.  Hearing/Vision screen Hearing Screening - Comments:: Denies hearing difficulties   Vision Screening - Comments:: Wears eyeglasses  Dietary issues and exercise activities discussed:     Goals Addressed             This Visit's Progress    Exercise 150 minutes per week (moderate activity)   Not on track    To do short, interval walks To continue to do resistance exercise with stretch bands as directed by PT.       Depression Screen    04/20/2022    9:45 AM 02/22/2021    1:22 PM 01/20/2020   11:34 AM 12/06/2019   10:04 AM 11/12/2018    9:07 AM 07/05/2017    9:36 AM 07/05/2016    9:16 AM  PHQ 2/9 Scores  PHQ - 2 Score 0 0 0 0 0 0 0    Fall Risk    10/21/2022    1:14 PM 04/20/2022    9:44 AM 02/22/2021    1:23 PM 01/20/2020   11:46 AM 09/11/2019    9:56 AM  Fall Risk   Falls in the past year? 1 0 0 1 1  Comment lost balance      Number falls in past yr:  0  0 1  Injury with Fall? 0 0  1 1  Risk for fall due to : No Fall Risks No Fall Risks  Impaired balance/gait;Orthopedic patient Impaired mobility;Impaired vision  Follow up Falls evaluation completed;Falls prevention discussed Falls evaluation completed  Falls evaluation completed Falls evaluation completed    MEDICARE RISK AT HOME:  Medicare Risk at Home - 10/21/22 1314     Any stairs in or around the home? Yes  outside steps   If so, are there any without handrails? Yes    Home free of loose throw rugs in walkways, pet beds, electrical cords, etc? Yes    Adequate lighting in your home to reduce risk of falls? Yes     Life alert? No    Use of a cane, walker or w/c? Yes   walker   Grab bars in the bathroom? No    Shower chair or bench in shower? Yes    Elevated toilet seat or a handicapped toilet? Yes             TIMED UP AND GO:  Was the test performed?  No    Cognitive Function:    07/06/2015   10:05 AM  MMSE - Mini Mental State Exam  Not completed: --        10/21/2022    1:15 PM 01/20/2020   11:47 AM  6CIT Screen  What Year? 0 points 0 points  What month? 0 points 0 points  What time?  3 points  Count back from 20  0 points  Months in reverse  0 points  Repeat phrase  0 points  Total Score  3 points    Immunizations Immunization History  Administered Date(s) Administered   Fluad Quad(high Dose 65+) 12/12/2018, 12/06/2019, 02/17/2022   Influenza Split 01/04/2012   Influenza, High Dose Seasonal PF 01/01/2015, 01/05/2016, 01/04/2017, 01/08/2018   Influenza,inj,Quad PF,6+ Mos 12/31/2013   Influenza-Unspecified 12/12/2012, 01/12/2021   Moderna Sars-Covid-2 Vaccination 10/07/2019, 11/04/2019   Pneumococcal Conjugate-13 08/20/2013   Pneumococcal Polysaccharide-23 08/25/2008, 07/06/2015, 02/17/2022   Td 08/25/2008   Tdap 11/12/2018    TDAP status: Up to date  Flu Vaccine status: Up to date  Pneumococcal vaccine status: Up to date  Covid-19 vaccine status: Completed vaccines  Qualifies for Shingles Vaccine? Yes   Zostavax completed Yes   Shingrix Completed?: Yes  Screening Tests Health Maintenance  Topic Date Due   Zoster Vaccines- Shingrix (1 of 2) Never done   OPHTHALMOLOGY EXAM  03/23/2019   FOOT EXAM  07/11/2019   HEMOGLOBIN A1C  12/12/2019   COVID-19 Vaccine (3 - 2023-24 season) 11/12/2021   INFLUENZA VACCINE  10/13/2022   Medicare Annual Wellness (AWV)  10/21/2023   DTaP/Tdap/Td (3 - Td or Tdap) 11/11/2028   Pneumonia Vaccine 31+ Years old  Completed   DEXA SCAN  Completed   HPV VACCINES  Aged Out    Health Maintenance  Health Maintenance Due  Topic  Date Due   Zoster Vaccines- Shingrix (1 of 2) Never done   OPHTHALMOLOGY EXAM  03/23/2019   FOOT EXAM  07/11/2019   HEMOGLOBIN A1C  12/12/2019   COVID-19 Vaccine (3 - 2023-24 season) 11/12/2021   INFLUENZA VACCINE  10/13/2022    Colorectal cancer screening: No longer required.   Mammogram status: No longer required due to age.   Lung Cancer Screening: (Low Dose CT Chest recommended if Age 63-80 years, 20 pack-year currently smoking OR have quit w/in 15years.) does not qualify.   Lung Cancer Screening Referral: N/A  Additional Screening:  Hepatitis C Screening: does not qualify;   Vision Screening: Recommended annual ophthalmology exams for early detection of glaucoma and other disorders of the eye. Is the patient up to date with their annual eye exam?  Yes  Who is the provider or what is the name of the office in which the patient attends annual eye exams? [facility] In [place] If pt is not established with a provider,  would they like to be referred to a provider to establish care? No .   Dental Screening: Recommended annual dental exams for proper oral hygiene    Community Resource Referral / Chronic Care Management: CRR required this visit?  No   CCM required this visit?  No     Plan:     I have personally reviewed and noted the following in the patient's chart:   Medical and social history Use of alcohol, tobacco or illicit drugs  Current medications and supplements including opioid prescriptions. Patient is not currently taking opioid prescriptions. Functional ability and status Nutritional status Physical activity Advanced directives List of other physicians Hospitalizations, surgeries, and ER visits in previous 12 months Vitals Screenings to include cognitive, depression, and falls Referrals and appointments  In addition, I have reviewed and discussed with patient certain preventive protocols, quality metrics, and best practice recommendations. A written  personalized care plan for preventive services as well as general preventive health recommendations were provided to patient.      L , CMA   10/21/2022   After Visit Summary: (Mail) Due to this being a telephonic visit, the after visit summary with patients personalized plan was offered to patient via mail   Nurse Notes: Patient is due for a DEXA but she declines the referral today.  She is requesting for refills of Furosemide 40 mg and Amlodipine 5 mg.  Patient states that she has been out x2 weeks.  She will call the office to schedule her next year's Medicare AWV or schedule at her next office visit.

## 2022-12-13 ENCOUNTER — Other Ambulatory Visit: Payer: Self-pay | Admitting: Internal Medicine

## 2022-12-13 DIAGNOSIS — I4819 Other persistent atrial fibrillation: Secondary | ICD-10-CM

## 2022-12-13 DIAGNOSIS — I10 Essential (primary) hypertension: Secondary | ICD-10-CM

## 2022-12-24 ENCOUNTER — Other Ambulatory Visit: Payer: Self-pay | Admitting: Cardiovascular Disease

## 2022-12-24 DIAGNOSIS — N189 Chronic kidney disease, unspecified: Secondary | ICD-10-CM

## 2022-12-24 DIAGNOSIS — I10 Essential (primary) hypertension: Secondary | ICD-10-CM

## 2023-01-09 ENCOUNTER — Encounter: Payer: Self-pay | Admitting: Internal Medicine

## 2023-01-09 ENCOUNTER — Ambulatory Visit (INDEPENDENT_AMBULATORY_CARE_PROVIDER_SITE_OTHER): Payer: Medicare Other | Admitting: Internal Medicine

## 2023-01-09 ENCOUNTER — Ambulatory Visit (INDEPENDENT_AMBULATORY_CARE_PROVIDER_SITE_OTHER): Payer: Medicare Other

## 2023-01-09 VITALS — BP 190/110 | HR 101 | Temp 97.9°F | Resp 16 | Ht 66.0 in | Wt 161.4 lb

## 2023-01-09 DIAGNOSIS — I10 Essential (primary) hypertension: Secondary | ICD-10-CM | POA: Diagnosis not present

## 2023-01-09 DIAGNOSIS — Z23 Encounter for immunization: Secondary | ICD-10-CM | POA: Diagnosis not present

## 2023-01-09 DIAGNOSIS — M1A9XX0 Chronic gout, unspecified, without tophus (tophi): Secondary | ICD-10-CM | POA: Diagnosis not present

## 2023-01-09 DIAGNOSIS — I4819 Other persistent atrial fibrillation: Secondary | ICD-10-CM

## 2023-01-09 DIAGNOSIS — E785 Hyperlipidemia, unspecified: Secondary | ICD-10-CM

## 2023-01-09 DIAGNOSIS — N185 Chronic kidney disease, stage 5: Secondary | ICD-10-CM

## 2023-01-09 DIAGNOSIS — R052 Subacute cough: Secondary | ICD-10-CM

## 2023-01-09 DIAGNOSIS — I517 Cardiomegaly: Secondary | ICD-10-CM | POA: Diagnosis not present

## 2023-01-09 DIAGNOSIS — D631 Anemia in chronic kidney disease: Secondary | ICD-10-CM | POA: Diagnosis not present

## 2023-01-09 DIAGNOSIS — R059 Cough, unspecified: Secondary | ICD-10-CM | POA: Diagnosis not present

## 2023-01-09 LAB — TSH: TSH: 1.7 u[IU]/mL (ref 0.35–5.50)

## 2023-01-09 LAB — CBC WITH DIFFERENTIAL/PLATELET
Basophils Absolute: 0 10*3/uL (ref 0.0–0.1)
Basophils Relative: 0.6 % (ref 0.0–3.0)
Eosinophils Absolute: 0.3 10*3/uL (ref 0.0–0.7)
Eosinophils Relative: 3.9 % (ref 0.0–5.0)
HCT: 37.9 % (ref 36.0–46.0)
Hemoglobin: 11.8 g/dL — ABNORMAL LOW (ref 12.0–15.0)
Lymphocytes Relative: 27.7 % (ref 12.0–46.0)
Lymphs Abs: 2.1 10*3/uL (ref 0.7–4.0)
MCHC: 31.2 g/dL (ref 30.0–36.0)
MCV: 101 fL — ABNORMAL HIGH (ref 78.0–100.0)
Monocytes Absolute: 0.7 10*3/uL (ref 0.1–1.0)
Monocytes Relative: 9.3 % (ref 3.0–12.0)
Neutro Abs: 4.4 10*3/uL (ref 1.4–7.7)
Neutrophils Relative %: 58.5 % (ref 43.0–77.0)
Platelets: 233 10*3/uL (ref 150.0–400.0)
RBC: 3.76 Mil/uL — ABNORMAL LOW (ref 3.87–5.11)
RDW: 14.2 % (ref 11.5–15.5)
WBC: 7.5 10*3/uL (ref 4.0–10.5)

## 2023-01-09 NOTE — Progress Notes (Unsigned)
Comments: EKG- A fib with PVCs Incomplete LBBB NS ST abnormality No LVH Pulmonary:     Effort: Pulmonary effort is normal.     Breath sounds: No stridor. No wheezing, rhonchi or rales.  Abdominal:     General: Abdomen is flat.     Palpations: There is no mass.     Tenderness: There is no abdominal tenderness. There is no guarding.     Hernia: No hernia is present.  Musculoskeletal:        General: Normal range of motion.     Cervical back: Neck supple.     Right lower leg: No edema.     Left lower leg: No edema.  Skin:    General: Skin is warm and dry.  Neurological:     General: No focal deficit present.     Mental Status: Mental status is at baseline.  Psychiatric:        Mood and Affect:  Mood normal.        Behavior: Behavior normal.     Lab Results  Component Value Date   WBC 6.9 02/17/2022   HGB 11.1 (L) 02/17/2022   HCT 35.3 (L) 02/17/2022   PLT 222.0 02/17/2022   GLUCOSE 97 02/17/2022   CHOL 154 11/12/2018   TRIG 123.0 11/12/2018   HDL 52.20 11/12/2018   LDLDIRECT 90.0 07/06/2015   LDLCALC 78 11/12/2018   ALT 8 02/17/2022   AST 16 02/17/2022   NA 142 02/17/2022   K 4.0 02/17/2022   CL 108 02/17/2022   CREATININE 4.33 (H) 02/17/2022   BUN 45 (H) 02/17/2022   CO2 23 02/17/2022   TSH 3.33 02/17/2022   INR 1.2 11/28/2019   HGBA1C 5.5 06/11/2019   MICROALBUR 133.0 (H) 06/11/2019    VAS [place] LOWER EXTREMITY VENOUS (DVT)  Result Date: 04/20/2022  Lower Venous DVT Study Patient Name:  Danielle Frazier  Date of Exam:   04/20/2022 Medical Rec #: [id number]      Accession #:    [id number] Date of Birth: 1930/10/02     Patient Gender: F Patient Age:   89 years Exam Location:  [facility] Procedure:      VAS [place] LOWER EXTREMITY VENOUS (DVT) Referring Phys: STACY BURNS --------------------------------------------------------------------------------  Indications: Left leg swelling x 2 months. Patient denies chest pain but is ahving increasing SOB.  Risk Factors: Of note, patient has stage 5 chronic kidney disease and persisent atrial fibrilliation. Comparison Study: Prior left leg venous duplex exam on 06/18/2019 showed no                   evidence of DVT. Performing Technologist: Carlos American RVT, RDCS (AE), RDMS  Examination Guidelines: A complete evaluation includes B-mode imaging, spectral Doppler, color Doppler, and power Doppler as needed of all accessible portions of each vessel. Bilateral testing is considered an integral part of a complete examination. Limited examinations for reoccurring indications may be performed as noted. The reflux portion of the exam is performed with the patient in reverse Trendelenburg.   +-----+---------------+---------+-----------+----------+--------------+ RIGHTCompressibilityPhasicitySpontaneityPropertiesThrombus Aging +-----+---------------+---------+-----------+----------+--------------+ CFV  Full           Yes      Yes                                 +-----+---------------+---------+-----------+----------+--------------+   +---------+---------------+---------+-----------+----------+--------------+ LEFT     CompressibilityPhasicitySpontaneityPropertiesThrombus Aging +---------+---------------+---------+-----------+----------+--------------+ CFV      Full  Comments: EKG- A fib with PVCs Incomplete LBBB NS ST abnormality No LVH Pulmonary:     Effort: Pulmonary effort is normal.     Breath sounds: No stridor. No wheezing, rhonchi or rales.  Abdominal:     General: Abdomen is flat.     Palpations: There is no mass.     Tenderness: There is no abdominal tenderness. There is no guarding.     Hernia: No hernia is present.  Musculoskeletal:        General: Normal range of motion.     Cervical back: Neck supple.     Right lower leg: No edema.     Left lower leg: No edema.  Skin:    General: Skin is warm and dry.  Neurological:     General: No focal deficit present.     Mental Status: Mental status is at baseline.  Psychiatric:        Mood and Affect:  Mood normal.        Behavior: Behavior normal.     Lab Results  Component Value Date   WBC 6.9 02/17/2022   HGB 11.1 (L) 02/17/2022   HCT 35.3 (L) 02/17/2022   PLT 222.0 02/17/2022   GLUCOSE 97 02/17/2022   CHOL 154 11/12/2018   TRIG 123.0 11/12/2018   HDL 52.20 11/12/2018   LDLDIRECT 90.0 07/06/2015   LDLCALC 78 11/12/2018   ALT 8 02/17/2022   AST 16 02/17/2022   NA 142 02/17/2022   K 4.0 02/17/2022   CL 108 02/17/2022   CREATININE 4.33 (H) 02/17/2022   BUN 45 (H) 02/17/2022   CO2 23 02/17/2022   TSH 3.33 02/17/2022   INR 1.2 11/28/2019   HGBA1C 5.5 06/11/2019   MICROALBUR 133.0 (H) 06/11/2019    VAS [place] LOWER EXTREMITY VENOUS (DVT)  Result Date: 04/20/2022  Lower Venous DVT Study Patient Name:  Danielle Frazier  Date of Exam:   04/20/2022 Medical Rec #: [id number]      Accession #:    [id number] Date of Birth: 1930/10/02     Patient Gender: F Patient Age:   89 years Exam Location:  [facility] Procedure:      VAS [place] LOWER EXTREMITY VENOUS (DVT) Referring Phys: STACY BURNS --------------------------------------------------------------------------------  Indications: Left leg swelling x 2 months. Patient denies chest pain but is ahving increasing SOB.  Risk Factors: Of note, patient has stage 5 chronic kidney disease and persisent atrial fibrilliation. Comparison Study: Prior left leg venous duplex exam on 06/18/2019 showed no                   evidence of DVT. Performing Technologist: Carlos American RVT, RDCS (AE), RDMS  Examination Guidelines: A complete evaluation includes B-mode imaging, spectral Doppler, color Doppler, and power Doppler as needed of all accessible portions of each vessel. Bilateral testing is considered an integral part of a complete examination. Limited examinations for reoccurring indications may be performed as noted. The reflux portion of the exam is performed with the patient in reverse Trendelenburg.   +-----+---------------+---------+-----------+----------+--------------+ RIGHTCompressibilityPhasicitySpontaneityPropertiesThrombus Aging +-----+---------------+---------+-----------+----------+--------------+ CFV  Full           Yes      Yes                                 +-----+---------------+---------+-----------+----------+--------------+   +---------+---------------+---------+-----------+----------+--------------+ LEFT     CompressibilityPhasicitySpontaneityPropertiesThrombus Aging +---------+---------------+---------+-----------+----------+--------------+ CFV      Full  Subjective:  Patient ID: Danielle Frazier, female    DOB: March 03, 1931  Age: 89 y.o. MRN: [id number]  CC: Hypertension and Atrial Fibrillation   HPI PURITY BENET presents for f/up ----  Outpatient Medications Prior to Visit  Medication Sig Dispense Refill   acetaminophen (TYLENOL) 500 MG tablet Take 1,000 mg by mouth as needed for mild pain or headache.      allopurinol (ZYLOPRIM) 100 MG tablet Take 1 tablet (100 mg total) by mouth daily. 90 tablet 1   amLODipine (NORVASC) 5 MG tablet TAKE 1 TABLET (5 MG TOTAL) BY MOUTH DAILY. 30 tablet 0   famotidine (PEPCID) 40 MG tablet TAKE 1 TABLET BY MOUTH EVERY DAY 30 tablet 0   furosemide (LASIX) 40 MG tablet Take 1 tablet (40 mg total) by mouth daily. 30 tablet 1   LINZESS 145 MCG CAPS capsule TAKE 1 CAPSULE BY MOUTH EVERY DAY 90 capsule 1   Multiple Vitamins-Minerals (CENTRUM SILVER PO) Take 1 tablet by mouth daily.     nebivolol (BYSTOLIC) 10 MG tablet Take 1 tablet (10 mg total) by mouth daily. 90 tablet 1   fluticasone (FLONASE) 50 MCG/ACT nasal spray Place 2 sprays into both nostrils as needed for allergies. 48 g 3   No facility-administered medications prior to visit.    ROS Review of Systems  Constitutional:  Positive for unexpected weight change (wt loss). Negative for appetite change, chills, diaphoresis and fatigue.  HENT: Negative.    Eyes: Negative.  Negative for visual disturbance.  Respiratory:  Positive for cough. Negative for chest tightness, shortness of breath and wheezing.        NP cough for 3 weeks  Cardiovascular:  Negative for chest pain, palpitations and leg swelling.  Gastrointestinal:  Negative for abdominal pain, constipation, diarrhea, nausea and vomiting.  Endocrine: Negative.   Genitourinary: Negative.  Negative for difficulty urinating and dysuria.  Musculoskeletal:  Positive for gait problem. Negative for arthralgias, back pain and myalgias.  Skin:  Negative for color change, pallor and rash.  Neurological:   Negative for dizziness and weakness.  Hematological:  Negative for adenopathy. Does not bruise/bleed easily.  Psychiatric/Behavioral:  Positive for decreased concentration and dysphoric mood.     Objective:  BP (!) 190/110 (BP Location: Left Arm, Patient Position: Sitting, Cuff Size: Normal)   Pulse (!) 101   Temp 97.9 F (36.6 C) (Oral)   Resp 16   Ht 5\' 6"  (1.676 m)   Wt 161 lb 6.4 oz (73.2 kg)   SpO2 97%   BMI 26.05 kg/m   BP Readings from Last 3 Encounters:  01/09/23 (!) 190/110  04/20/22 (!) 140/86  02/17/22 132/84    Wt Readings from Last 3 Encounters:  01/09/23 161 lb 6.4 oz (73.2 kg)  10/21/22 157 lb (71.2 kg)  04/20/22 173 lb (78.5 kg)    Physical Exam Vitals reviewed.  Constitutional:      General: She is not in acute distress.    Appearance: She is ill-appearing. She is not toxic-appearing or diaphoretic.  HENT:     Nose: Nose normal.     Mouth/Throat:     Mouth: Mucous membranes are moist.  Eyes:     General: No scleral icterus.    Conjunctiva/sclera: Conjunctivae normal.  Cardiovascular:     Rate and Rhythm: Normal rate. Rhythm irregularly irregular.     Pulses: Normal pulses.     Heart sounds: No murmur heard.    No friction rub. No gallop.  Comments: EKG- A fib with PVCs Incomplete LBBB NS ST abnormality No LVH Pulmonary:     Effort: Pulmonary effort is normal.     Breath sounds: No stridor. No wheezing, rhonchi or rales.  Abdominal:     General: Abdomen is flat.     Palpations: There is no mass.     Tenderness: There is no abdominal tenderness. There is no guarding.     Hernia: No hernia is present.  Musculoskeletal:        General: Normal range of motion.     Cervical back: Neck supple.     Right lower leg: No edema.     Left lower leg: No edema.  Skin:    General: Skin is warm and dry.  Neurological:     General: No focal deficit present.     Mental Status: Mental status is at baseline.  Psychiatric:        Mood and Affect:  Mood normal.        Behavior: Behavior normal.     Lab Results  Component Value Date   WBC 6.9 02/17/2022   HGB 11.1 (L) 02/17/2022   HCT 35.3 (L) 02/17/2022   PLT 222.0 02/17/2022   GLUCOSE 97 02/17/2022   CHOL 154 11/12/2018   TRIG 123.0 11/12/2018   HDL 52.20 11/12/2018   LDLDIRECT 90.0 07/06/2015   LDLCALC 78 11/12/2018   ALT 8 02/17/2022   AST 16 02/17/2022   NA 142 02/17/2022   K 4.0 02/17/2022   CL 108 02/17/2022   CREATININE 4.33 (H) 02/17/2022   BUN 45 (H) 02/17/2022   CO2 23 02/17/2022   TSH 3.33 02/17/2022   INR 1.2 11/28/2019   HGBA1C 5.5 06/11/2019   MICROALBUR 133.0 (H) 06/11/2019    VAS [place] LOWER EXTREMITY VENOUS (DVT)  Result Date: 04/20/2022  Lower Venous DVT Study Patient Name:  Danielle Frazier  Date of Exam:   04/20/2022 Medical Rec #: [id number]      Accession #:    [id number] Date of Birth: 1930/10/02     Patient Gender: F Patient Age:   89 years Exam Location:  [facility] Procedure:      VAS [place] LOWER EXTREMITY VENOUS (DVT) Referring Phys: STACY BURNS --------------------------------------------------------------------------------  Indications: Left leg swelling x 2 months. Patient denies chest pain but is ahving increasing SOB.  Risk Factors: Of note, patient has stage 5 chronic kidney disease and persisent atrial fibrilliation. Comparison Study: Prior left leg venous duplex exam on 06/18/2019 showed no                   evidence of DVT. Performing Technologist: Carlos American RVT, RDCS (AE), RDMS  Examination Guidelines: A complete evaluation includes B-mode imaging, spectral Doppler, color Doppler, and power Doppler as needed of all accessible portions of each vessel. Bilateral testing is considered an integral part of a complete examination. Limited examinations for reoccurring indications may be performed as noted. The reflux portion of the exam is performed with the patient in reverse Trendelenburg.   +-----+---------------+---------+-----------+----------+--------------+ RIGHTCompressibilityPhasicitySpontaneityPropertiesThrombus Aging +-----+---------------+---------+-----------+----------+--------------+ CFV  Full           Yes      Yes                                 +-----+---------------+---------+-----------+----------+--------------+   +---------+---------------+---------+-----------+----------+--------------+ LEFT     CompressibilityPhasicitySpontaneityPropertiesThrombus Aging +---------+---------------+---------+-----------+----------+--------------+ CFV      Full

## 2023-01-09 NOTE — Patient Instructions (Signed)
Hypertension, Adult High blood pressure (hypertension) is when the force of blood pumping through the arteries is too strong. The arteries are the blood vessels that carry blood from the heart throughout the body. Hypertension forces the heart to work harder to pump blood and may cause arteries to become narrow or stiff. Untreated or uncontrolled hypertension can lead to a heart attack, heart failure, a stroke, kidney disease, and other problems. A blood pressure reading consists of a higher number over a lower number. Ideally, your blood pressure should be below 120/80. The first ("top") number is called the systolic pressure. It is a measure of the pressure in your arteries as your heart beats. The second ("bottom") number is called the diastolic pressure. It is a measure of the pressure in your arteries as the heart relaxes. What are the causes? The exact cause of this condition is not known. There are some conditions that result in high blood pressure. What increases the risk? Certain factors may make you more likely to develop high blood pressure. Some of these risk factors are under your control, including: Smoking. Not getting enough exercise or physical activity. Being overweight. Having too much fat, sugar, calories, or salt (sodium) in your diet. Drinking too much alcohol. Other risk factors include: Having a personal history of heart disease, diabetes, high cholesterol, or kidney disease. Stress. Having a family history of high blood pressure and high cholesterol. Having obstructive sleep apnea. Age. The risk increases with age. What are the signs or symptoms? High blood pressure may not cause symptoms. Very high blood pressure (hypertensive crisis) may cause: Headache. Fast or irregular heartbeats (palpitations). Shortness of breath. Nosebleed. Nausea and vomiting. Vision changes. Severe chest pain, dizziness, and seizures. How is this diagnosed? This condition is diagnosed by  measuring your blood pressure while you are seated, with your arm resting on a flat surface, your legs uncrossed, and your feet flat on the floor. The cuff of the blood pressure monitor will be placed directly against the skin of your upper arm at the level of your heart. Blood pressure should be measured at least twice using the same arm. Certain conditions can cause a difference in blood pressure between your right and left arms. If you have a high blood pressure reading during one visit or you have normal blood pressure with other risk factors, you may be asked to: Return on a different day to have your blood pressure checked again. Monitor your blood pressure at home for 1 week or longer. If you are diagnosed with hypertension, you may have other blood or imaging tests to help your health care provider understand your overall risk for other conditions. How is this treated? This condition is treated by making healthy lifestyle changes, such as eating healthy foods, exercising more, and reducing your alcohol intake. You may be referred for counseling on a healthy diet and physical activity. Your health care provider may prescribe medicine if lifestyle changes are not enough to get your blood pressure under control and if: Your systolic blood pressure is above 130. Your diastolic blood pressure is above 80. Your personal target blood pressure may vary depending on your medical conditions, your age, and other factors. Follow these instructions at home: Eating and drinking  Eat a diet that is high in fiber and potassium, and low in sodium, added sugar, and fat. An example of this eating plan is called the DASH diet. DASH stands for Dietary Approaches to Stop Hypertension. To eat this way: Eat   plenty of fresh fruits and vegetables. Try to fill one half of your plate at each meal with fruits and vegetables. Eat whole grains, such as whole-wheat pasta, brown rice, or whole-grain bread. Fill about one  fourth of your plate with whole grains. Eat or drink low-fat dairy products, such as skim milk or low-fat yogurt. Avoid fatty cuts of meat, processed or cured meats, and poultry with skin. Fill about one fourth of your plate with lean proteins, such as fish, chicken without skin, beans, eggs, or tofu. Avoid pre-made and processed foods. These tend to be higher in sodium, added sugar, and fat. Reduce your daily sodium intake. Many people with hypertension should eat less than 1,500 mg of sodium a day. Do not drink alcohol if: Your health care provider tells you not to drink. You are pregnant, may be pregnant, or are planning to become pregnant. If you drink alcohol: Limit how much you have to: 0-1 drink a day for women. 0-2 drinks a day for men. Know how much alcohol is in your drink. In the [place] one drink equals one 12 oz bottle of beer (355 mL), one 5 oz glass of wine (148 mL), or one 1 oz glass of hard liquor (44 mL). Lifestyle  Work with your health care provider to maintain a healthy body weight or to lose weight. Ask what an ideal weight is for you. Get at least 30 minutes of exercise that causes your heart to beat faster (aerobic exercise) most days of the week. Activities may include walking, swimming, or biking. Include exercise to strengthen your muscles (resistance exercise), such as Pilates or lifting weights, as part of your weekly exercise routine. Try to do these types of exercises for 30 minutes at least 3 days a week. Do not use any products that contain nicotine or tobacco. These products include cigarettes, chewing tobacco, and vaping devices, such as e-cigarettes. If you need help quitting, ask your health care provider. Monitor your blood pressure at home as told by your health care provider. Keep all follow-up visits. This is important. Medicines Take over-the-counter and prescription medicines only as told by your health care provider. Follow directions carefully. Blood  pressure medicines must be taken as prescribed. Do not skip doses of blood pressure medicine. Doing this puts you at risk for problems and can make the medicine less effective. Ask your health care provider about side effects or reactions to medicines that you should watch for. Contact a health care provider if you: Think you are having a reaction to a medicine you are taking. Have headaches that keep coming back (recurring). Feel dizzy. Have swelling in your ankles. Have trouble with your vision. Get help right away if you: Develop a severe headache or confusion. Have unusual weakness or numbness. Feel faint. Have severe pain in your chest or abdomen. Vomit repeatedly. Have trouble breathing. These symptoms may be an emergency. Get help right away. Call 911. Do not wait to see if the symptoms will go away. Do not drive yourself to the hospital. Summary Hypertension is when the force of blood pumping through your arteries is too strong. If this condition is not controlled, it may put you at risk for serious complications. Your personal target blood pressure may vary depending on your medical conditions, your age, and other factors. For most people, a normal blood pressure is less than 120/80. Hypertension is treated with lifestyle changes, medicines, or a combination of both. Lifestyle changes include losing weight, eating a healthy,   low-sodium diet, exercising more, and limiting alcohol. This information is not intended to replace advice given to you by your health care provider. Make sure you discuss any questions you have with your health care provider. Document Revised: 01/05/2021 Document Reviewed: 01/05/2021 Elsevier Patient Education  2024 [employer]  

## 2023-01-10 ENCOUNTER — Other Ambulatory Visit: Payer: Self-pay | Admitting: Internal Medicine

## 2023-01-10 ENCOUNTER — Telehealth: Payer: Self-pay

## 2023-01-10 DIAGNOSIS — I4819 Other persistent atrial fibrillation: Secondary | ICD-10-CM

## 2023-01-10 DIAGNOSIS — I10 Essential (primary) hypertension: Secondary | ICD-10-CM

## 2023-01-10 LAB — BASIC METABOLIC PANEL
BUN: 51 mg/dL — ABNORMAL HIGH (ref 6–23)
CO2: 24 meq/L (ref 19–32)
Calcium: 10.4 mg/dL (ref 8.4–10.5)
Chloride: 107 meq/L (ref 96–112)
Creatinine, Ser: 4.97 mg/dL (ref 0.40–1.20)
GFR: 7.27 mL/min — CL (ref >60.00)
Glucose, Bld: 94 mg/dL (ref 70–99)
Potassium: 4.3 meq/L (ref 3.5–5.1)
Sodium: 142 meq/L (ref 135–145)

## 2023-01-10 LAB — URIC ACID: Uric Acid, Serum: 7 mg/dL (ref 2.4–7.0)

## 2023-01-10 LAB — HEPATIC FUNCTION PANEL
ALT: 7 U/L (ref 0–35)
AST: 15 U/L (ref 0–37)
Albumin: 3.8 g/dL (ref 3.5–5.2)
Alkaline Phosphatase: 232 U/L — ABNORMAL HIGH (ref 39–117)
Bilirubin, Direct: 0.1 mg/dL (ref 0.0–0.3)
Total Bilirubin: 0.5 mg/dL (ref 0.2–1.2)
Total Protein: 7 g/dL (ref 6.0–8.3)

## 2023-01-10 MED ORDER — TERAZOSIN HCL 2 MG PO CAPS
2.0000 mg | ORAL_CAPSULE | Freq: Every day | ORAL | 0 refills | Status: DC
Start: 2023-01-10 — End: 2023-09-08

## 2023-01-10 MED ORDER — NEBIVOLOL HCL 10 MG PO TABS
10.0000 mg | ORAL_TABLET | Freq: Every day | ORAL | 0 refills | Status: DC
Start: 2023-01-10 — End: 2023-04-12

## 2023-01-10 NOTE — Telephone Encounter (Signed)
CRITICAL VALUE STICKER  CRITICAL VALUE: creatine 4.97  GFR 7.27  RECEIVER (on-site recipient of call): Dahlia Client  DATE & TIME NOTIFIED: 01/10/2023 at 10:52 am  MESSENGER (representative from lab): Shawna Orleans  MD NOTIFIED: Dr. Yetta Barre

## 2023-01-20 DIAGNOSIS — C4442 Squamous cell carcinoma of skin of scalp and neck: Secondary | ICD-10-CM | POA: Diagnosis not present

## 2023-02-05 ENCOUNTER — Other Ambulatory Visit: Payer: Self-pay | Admitting: Internal Medicine

## 2023-04-12 ENCOUNTER — Other Ambulatory Visit: Payer: Self-pay | Admitting: Internal Medicine

## 2023-04-12 DIAGNOSIS — I10 Essential (primary) hypertension: Secondary | ICD-10-CM

## 2023-04-12 DIAGNOSIS — I4819 Other persistent atrial fibrillation: Secondary | ICD-10-CM

## 2023-04-26 ENCOUNTER — Other Ambulatory Visit: Payer: Self-pay | Admitting: Internal Medicine

## 2023-04-26 ENCOUNTER — Telehealth: Payer: Self-pay | Admitting: Internal Medicine

## 2023-04-26 DIAGNOSIS — N185 Chronic kidney disease, stage 5: Secondary | ICD-10-CM

## 2023-04-26 DIAGNOSIS — I10 Essential (primary) hypertension: Secondary | ICD-10-CM

## 2023-04-26 MED ORDER — FUROSEMIDE 40 MG PO TABS: 40.0000 mg | ORAL_TABLET | Freq: Every day | ORAL | 1 refills | Status: DC

## 2023-04-26 NOTE — Telephone Encounter (Signed)
Prescription Request  04/26/2023  LOV: 01/09/2023  What is the name of the medication or equipment? furosemide (LASIX) 40 MG tablet   Have you contacted your pharmacy to request a refill? No   Which pharmacy would you like this sent to?  [id number] [place], [place] - [address] [place] [address] Phone: [phone] Fax: [phone]    Patient notified that their request is being sent to the clinical staff for review and that they should receive a response within 2 business days.   Please advise at [facility] [phone]

## 2023-04-27 ENCOUNTER — Other Ambulatory Visit: Payer: Self-pay

## 2023-04-27 DIAGNOSIS — N185 Chronic kidney disease, stage 5: Secondary | ICD-10-CM

## 2023-04-27 DIAGNOSIS — I10 Essential (primary) hypertension: Secondary | ICD-10-CM

## 2023-04-27 MED ORDER — FUROSEMIDE 40 MG PO TABS: 40.0000 mg | ORAL_TABLET | Freq: Every day | ORAL | 1 refills | Status: DC

## 2023-04-27 NOTE — Telephone Encounter (Signed)
Medication has been sent in

## 2023-07-11 ENCOUNTER — Other Ambulatory Visit: Payer: Self-pay | Admitting: Internal Medicine

## 2023-07-11 DIAGNOSIS — I10 Essential (primary) hypertension: Secondary | ICD-10-CM

## 2023-07-11 DIAGNOSIS — I4819 Other persistent atrial fibrillation: Secondary | ICD-10-CM

## 2023-07-25 ENCOUNTER — Other Ambulatory Visit: Payer: Self-pay

## 2023-09-05 ENCOUNTER — Encounter (HOSPITAL_COMMUNITY): Payer: Self-pay | Admitting: Emergency Medicine

## 2023-09-05 ENCOUNTER — Emergency Department (HOSPITAL_COMMUNITY)
Admission: EM | Admit: 2023-09-05 | Discharge: 2023-09-05 | Disposition: A | Discharge status: Home / Self Care | Attending: Emergency Medicine | Admitting: Emergency Medicine

## 2023-09-05 ENCOUNTER — Other Ambulatory Visit: Payer: Self-pay

## 2023-09-05 DIAGNOSIS — R040 Epistaxis: Secondary | ICD-10-CM | POA: Diagnosis not present

## 2023-09-05 DIAGNOSIS — I499 Cardiac arrhythmia, unspecified: Secondary | ICD-10-CM | POA: Diagnosis not present

## 2023-09-05 DIAGNOSIS — Z743 Need for continuous supervision: Secondary | ICD-10-CM | POA: Diagnosis not present

## 2023-09-05 DIAGNOSIS — I4891 Unspecified atrial fibrillation: Secondary | ICD-10-CM | POA: Diagnosis not present

## 2023-09-05 DIAGNOSIS — R6889 Other general symptoms and signs: Secondary | ICD-10-CM | POA: Diagnosis not present

## 2023-09-05 LAB — I-STAT CHEM 8, ED
BUN: 51 mg/dL — ABNORMAL HIGH (ref 8–23)
Calcium, Ion: 1.26 mmol/L (ref 1.15–1.40)
Chloride: 109 mmol/L (ref 98–111)
Creatinine, Ser: 5.8 mg/dL — ABNORMAL HIGH (ref 0.44–1.00)
Glucose, Bld: 118 mg/dL — ABNORMAL HIGH (ref 70–99)
HCT: 31 % — ABNORMAL LOW (ref 36.0–46.0)
Hemoglobin: 10.5 g/dL — ABNORMAL LOW (ref 12.0–15.0)
Potassium: 3.8 mmol/L (ref 3.5–5.1)
Sodium: 142 mmol/L (ref 135–145)
TCO2: 19 mmol/L — ABNORMAL LOW (ref 22–32)

## 2023-09-05 MED ORDER — SALINE SPRAY 0.65 % NA SOLN: 1.0000 | NASAL | 0 refills | Status: AC | PRN

## 2023-09-05 MED ORDER — AMLODIPINE BESYLATE 5 MG PO TABS
5.0000 mg | ORAL_TABLET | Freq: Every day | ORAL | Status: DC
  Administered 2023-09-05: 5 mg via ORAL
  Filled 2023-09-05: qty 1

## 2023-09-05 NOTE — ED Triage Notes (Addendum)
 Pt BIB EMS from home with c/o epistaxis, got up to the bathroom and her nose started bleeding. Took afrin in both nostrils with 30 minutes of direct pressure to get it stop. Takes eliquis , has not been taking her BP meds x 3 days. Bleeding stopped at this time.   194/92 88HR

## 2023-09-05 NOTE — ED Provider Notes (Signed)
 [facility] Emergency Department Provider Note MRN:  [id number]  Arrival date & time: 09/05/23     Chief Complaint   Epistaxis   History of Present Illness   Danielle Frazier is a 89 y.o. year-old female presents to the ED with chief complaint of axis.  She is states that she was awakened with a nosebleed.  She tried using Afrin and pressure to get it to stop.  She states that she has forgotten to take her blood pressure medication for the past few days.  Triage note states that she takes Eliquis , but patient states that she does not.  She denies any new trauma.  She states that the bleeding has been stopped for about 1 hour.  She denies any other associated symptoms.  History provided by patient.   Review of Systems  Pertinent positive and negative review of systems noted in HPI.    Physical Exam   Vitals:   09/05/23 0317 09/05/23 0400  BP: (!) 182/111 (!) 188/87  Pulse: 75 72  Resp: 18   Temp: 97.8 F (36.6 C)   SpO2: 100% 98%    CONSTITUTIONAL:  non toxic-appearing, NAD NEURO:  Alert and oriented x 3, CN 3-12 grossly intact EYES:  eyes equal and reactive ENT/NECK:  Supple, no stridor, dried blood around bilateral nares, no active bleeding, normal oropharynx CARDIO:  normal rate, regular rhythm, appears well-perfused  PULM:  No respiratory distress, CTAB GI/GU:  non-distended,  MSK/SPINE:  No gross deformities, no edema, moves all extremities  SKIN:  no rash, atraumatic   *Additional and/or pertinent findings included in MDM below  Diagnostic and Interventional Summary    EKG Interpretation Date/Time:    Ventricular Rate:    PR Interval:    QRS Duration:    QT Interval:    QTC Calculation:   R Axis:      Text Interpretation:         Labs Reviewed  I-STAT CHEM 8, ED - Abnormal; Notable for the following components:      Result Value   BUN 51 (*)    Creatinine, Ser 5.80 (*)    Glucose, Bld 118 (*)    TCO2 19 (*)    Hemoglobin  10.5 (*)    HCT 31.0 (*)    All other components within normal limits    No orders to display    Medications  amLODipine  (NORVASC ) tablet 5 mg (5 mg Oral Given 09/05/23 0349)     Procedures  /  Critical Care Procedures  ED Course and Medical Decision Making  I have reviewed the triage vital signs, the nursing notes, and pertinent available records from the EMR.  Social Determinants Affecting Complexity of Care: Patient has no clinically significant social determinants affecting this chief complaint..   ED Course:    Medical Decision Making Patient here with epistaxis.  The bleeding has been stopped for about 2 hours now.  Blood counts are not significantly different from remote prior comparison last year.  Patient is not taking Eliquis  anymore.  I gave her a dose of her home blood pressure medication.  Will discharge home with instructions for nosebleed treatment and care.  Rx for nasal saline.  Risk OTC drugs. Prescription drug management.         Consultants: No consultations were needed in caring for this patient.   Treatment and Plan: I considered admission due to patient's initial presentation, but after considering the examination and diagnostic results, patient will  not require admission and can be discharged with outpatient follow-up.    Final Clinical Impressions(s) / ED Diagnoses     ICD-10-CM   1. Epistaxis  R04.0       ED Discharge Orders          Ordered    sodium chloride  (OCEAN) 0.65 % SOLN nasal spray  As needed        09/05/23 0425              Discharge Instructions Discussed with and Provided to Patient:   Discharge Instructions   None      Vicky Charleston, PA-C 09/05/23 [id number]    Jerral Meth, MD 09/05/23 [phone]

## 2023-09-08 ENCOUNTER — Emergency Department (HOSPITAL_COMMUNITY)
Admission: EM | Admit: 2023-09-08 | Discharge: 2023-09-08 | Disposition: A | Discharge status: Home / Self Care | Attending: Emergency Medicine | Admitting: Emergency Medicine

## 2023-09-08 ENCOUNTER — Encounter (HOSPITAL_COMMUNITY): Payer: Self-pay

## 2023-09-08 ENCOUNTER — Other Ambulatory Visit: Payer: Self-pay

## 2023-09-08 DIAGNOSIS — R001 Bradycardia, unspecified: Secondary | ICD-10-CM | POA: Diagnosis not present

## 2023-09-08 DIAGNOSIS — R040 Epistaxis: Secondary | ICD-10-CM | POA: Insufficient documentation

## 2023-09-08 DIAGNOSIS — I499 Cardiac arrhythmia, unspecified: Secondary | ICD-10-CM | POA: Diagnosis not present

## 2023-09-08 DIAGNOSIS — R58 Hemorrhage, not elsewhere classified: Secondary | ICD-10-CM | POA: Diagnosis not present

## 2023-09-08 MED ORDER — OXYMETAZOLINE HCL 0.05 % NA SOLN: 1.0000 | Freq: Once | NASAL | Status: DC

## 2023-09-08 MED ORDER — OXYMETAZOLINE HCL 0.05 % NA SOLN: 1.0000 | Freq: Two times a day (BID) | NASAL | 0 refills | Status: DC | PRN

## 2023-09-08 MED ORDER — NEBIVOLOL HCL 10 MG PO TABS
10.0000 mg | ORAL_TABLET | Freq: Once | ORAL | Status: AC
  Administered 2023-09-08: 10 mg via ORAL
  Filled 2023-09-08: qty 1

## 2023-09-08 NOTE — ED Notes (Signed)
 Spouse at bedside states he is concerned d/t pt having two episodes of nosebleeds. He just want to know the cause and preventions.

## 2023-09-08 NOTE — ED Provider Notes (Signed)
 [place] EMERGENCY DEPARTMENT AT [facility] Provider Note   CSN: [id number] Arrival date & time: 09/08/23  1019     Patient presents with: Epistaxis   Danielle Frazier is a 89 y.o. female.  With a history of atrial fibrillation who presents to the ED for epistaxis.  Patient was seen here on June 24 for nosebleed which resolved in the ED.  She was discharged with prescription for nasal saline.  Her nosebleed started again this morning around 0800.  EMS was called and the bleeding stopped after applying Afrin to right nostril.  No recurrence of bleeding.  No other bleeding episodes.  No recent trauma or falls.  Patient was previously on Eliquis  for A-fib but this medication was discontinued    Epistaxis      Prior to Admission medications   Medication Sig Start Date End Date Taking? Authorizing Provider  acetaminophen  (TYLENOL ) 500 MG tablet Take 1,000 mg by mouth as needed for mild pain or headache.    Yes [provider]  allopurinol  (ZYLOPRIM ) 100 MG tablet Take 1 tablet (100 mg total) by mouth daily. 10/21/22  Yes Joshua Debby CROME, MD  furosemide  (LASIX ) 40 MG tablet Take 1 tablet (40 mg total) by mouth daily. 04/27/23  Yes Joshua Debby CROME, MD  nebivolol  (BYSTOLIC ) 10 MG tablet TAKE 1 TABLET BY MOUTH EVERY DAY 07/25/23  Yes Joshua Debby CROME, MD  oxymetazoline (AFRIN NASAL SPRAY) 0.05 % nasal spray Place 1 spray into both nostrils 2 (two) times daily as needed (Nosebleed). 09/08/23  Yes Pamella Ozell LABOR, DO  sodium chloride  (OCEAN) 0.65 % SOLN nasal spray Place 1 spray into both nostrils as needed for congestion. Patient not taking: Reported on 09/08/2023 09/05/23   Vicky Charleston, PA-C    Allergies: Glucophage [metformin] and Crestor  [rosuvastatin ]    Review of Systems  HENT:  Positive for nosebleeds.     Updated Vital Signs BP 126/82   Pulse 72   Temp 97.8 F (36.6 C) (Oral)   Resp 20   Ht 5' 6 (1.676 m)   Wt 73.2 kg   SpO2 98%   BMI 26.05 kg/m    Physical Exam Vitals and nursing note reviewed.  HENT:     Head: Normocephalic and atraumatic.     Nose:     Comments: Dried blood in bilateral naris No active bleeding   Eyes:     Pupils: Pupils are equal, round, and reactive to light.    Cardiovascular:     Rate and Rhythm: Normal rate and regular rhythm.  Pulmonary:     Effort: Pulmonary effort is normal.     Breath sounds: Normal breath sounds.  Abdominal:     Palpations: Abdomen is soft.     Tenderness: There is no abdominal tenderness.   Skin:    General: Skin is warm and dry.   Neurological:     Mental Status: She is alert.   Psychiatric:        Mood and Affect: Mood normal.     (all labs ordered are listed, but only abnormal results are displayed) Labs Reviewed - No data to display  EKG: None  Radiology: No results found.   Procedures   Medications Ordered in the ED  oxymetazoline (AFRIN) 0.05 % nasal spray 1 spray (0 sprays Each Nare Hold 09/08/23 1030)  nebivolol  (BYSTOLIC ) tablet 10 mg (10 mg Oral Given 09/08/23 1158)    Clinical Course as of 09/08/23 1319  Fri Sep 08, 2023  1312 No recurrence of epistaxis.  Patient mains hemodynamically stable.  Counseled her and her husband on proper epistaxis management at home with Afrin and direct pressure.  Will provide him with a prescription for Afrin and instruct him to come back if nosebleeds recur despite Afrin use and pressure [MP]    Clinical Course User Index [MP] Pamella Ozell LABOR, DO                                 Medical Decision Making 89 year old female returns for another nosebleed.  Nosebleed stopped after EMS applied Afrin.  Patient is hemodynamically stable.  Will continue to monitor for recurrence of nosebleed  Risk OTC drugs. Prescription drug management.        Final diagnoses:  Epistaxis    ED Discharge Orders          Ordered    oxymetazoline [facility] NASAL SPRAY) 0.05 % nasal spray  2 times daily PRN        09/08/23  1318               Pamella Ozell LABOR, DO 09/08/23 1319

## 2023-09-08 NOTE — ED Notes (Signed)
 Pt provided emesis bag and tissue box just in case nose bleed starts again. Pt educated to not blow nose, pick at nose, or irritate nose at this time. Pt verbalized understanding.

## 2023-09-08 NOTE — ED Triage Notes (Signed)
 Pt bib ems from home c/o nose bleed. Pt received  2 sprays of Afrin in right nostril. Bleeding is controlled.   EMS noted new onset Afib 50-84. Pt is on Eliquis  but not sure why. Pt has right sided deficits that have been going on for 6 months but no dx of CVA.  18 Lft AC  BP 179/86 O2 98%

## 2023-09-08 NOTE — ED Notes (Signed)
 NT assisted pt on bedpan

## 2023-09-08 NOTE — Discharge Instructions (Addendum)
 You were seen in the emergency department for nosebleed The nosebleed stopped after EMS applied Afrin and held pressure We have called in a prescription for Afrin for you to pick up and begin using as directed Place 1 spray into both nostrils if you have nosebleeds again and then hold pressure for 15 minutes If this does not work to stop the bleeding come back to the emergency department or call 911

## 2023-09-11 ENCOUNTER — Encounter: Payer: Self-pay | Admitting: Family Medicine

## 2023-09-11 ENCOUNTER — Ambulatory Visit (INDEPENDENT_AMBULATORY_CARE_PROVIDER_SITE_OTHER): Admitting: Family Medicine

## 2023-09-11 VITALS — BP 136/80 | HR 67 | Temp 98.5°F | Resp 16 | Ht 66.0 in | Wt 160.0 lb

## 2023-09-11 DIAGNOSIS — N185 Chronic kidney disease, stage 5: Secondary | ICD-10-CM

## 2023-09-11 DIAGNOSIS — D631 Anemia in chronic kidney disease: Secondary | ICD-10-CM | POA: Diagnosis not present

## 2023-09-11 DIAGNOSIS — R040 Epistaxis: Secondary | ICD-10-CM

## 2023-09-11 LAB — CBC WITH DIFFERENTIAL/PLATELET
Basophils Absolute: 0 10*3/uL (ref 0.0–0.1)
Basophils Relative: 0.7 % (ref 0.0–3.0)
Eosinophils Absolute: 0.1 10*3/uL (ref 0.0–0.7)
Eosinophils Relative: 2.8 % (ref 0.0–5.0)
HCT: 26.5 % — ABNORMAL LOW (ref 36.0–46.0)
Hemoglobin: 8.6 g/dL — ABNORMAL LOW (ref 12.0–15.0)
Lymphocytes Relative: 23.8 % (ref 12.0–46.0)
Lymphs Abs: 1.2 10*3/uL (ref 0.7–4.0)
MCHC: 32.6 g/dL (ref 30.0–36.0)
MCV: 96.9 fl (ref 78.0–100.0)
Monocytes Absolute: 0.4 10*3/uL (ref 0.1–1.0)
Monocytes Relative: 8.5 % (ref 3.0–12.0)
Neutro Abs: 3.1 10*3/uL (ref 1.4–7.7)
Neutrophils Relative %: 64.2 % (ref 43.0–77.0)
Platelets: 198 10*3/uL (ref 150.0–400.0)
RBC: 2.73 Mil/uL — ABNORMAL LOW (ref 3.87–5.11)
RDW: 15.9 % — ABNORMAL HIGH (ref 11.5–15.5)
WBC: 4.9 10*3/uL (ref 4.0–10.5)

## 2023-09-11 NOTE — Progress Notes (Signed)
 Assessment & Plan:  1. Recurrent epistaxis (Primary) Education provided on nosebleeds. Encouraged saline nasal spray daily and Afrin nasal spray during a nose bleed. Proper technique to apply pressure leaning forward reinforced.  - Ambulatory referral to ENT - CBC with Differential/Platelet  2. Anemia due to stage 5 chronic kidney disease (HCC)  Eat foods that have a lot of iron, such as: Liver. Low-fat (lean) beef. Breads and cereals that have iron added to them. Eggs. Dried fruit. Dark green, leafy vegetables. Eat fresh fruits and vegetables that are high in vitamin C. They help your body use iron. Foods with a lot of vitamin C include: Oranges. Peppers. Tomatoes. Mangoes.    Follow up plan: Return if symptoms worsen or fail to improve.  Niki Rung, MSN, APRN, FNP-C  Subjective:  HPI: Danielle Frazier is a 89 y.o. female presenting on 09/11/2023 for Epistaxis (Nose bleeds on and off x 7 days- was seen in the ED twice)  Patient is accompanied by her husband, who she is okay with being present.  Patient had a nosebleed that landed her in the emergency department last Tuesday and Friday.  She had another nosebleed last night and this morning.  She is not taking anticoagulants.  She denies frequent blowing of her nose.   ROS: Negative unless specifically indicated above in HPI.   Relevant past medical history reviewed and updated as indicated.   Allergies and medications reviewed and updated.   Current Outpatient Medications:    acetaminophen  (TYLENOL ) 500 MG tablet, Take 1,000 mg by mouth as needed for mild pain or headache. , Disp: , Rfl:    amLODipine  (NORVASC ) 5 MG tablet, Take 5 mg by mouth daily., Disp: , Rfl:    famotidine  (PEPCID ) 40 MG tablet, Take 40 mg by mouth daily., Disp: , Rfl:    furosemide  (LASIX ) 40 MG tablet, Take 1 tablet (40 mg total) by mouth daily., Disp: 30 tablet, Rfl: 1   linaclotide  (LINZESS ) 145 MCG CAPS capsule, Take 145 mcg by mouth daily  before breakfast., Disp: , Rfl:    nebivolol  (BYSTOLIC ) 10 MG tablet, TAKE 1 TABLET BY MOUTH EVERY DAY, Disp: 90 tablet, Rfl: 0   oxymetazoline (AFRIN NASAL SPRAY) 0.05 % nasal spray, Place 1 spray into both nostrils 2 (two) times daily as needed (Nosebleed)., Disp: 30 mL, Rfl: 0   sodium chloride  (OCEAN) 0.65 % SOLN nasal spray, Place 1 spray into both nostrils as needed for congestion., Disp: 30 mL, Rfl: 0   allopurinol  (ZYLOPRIM ) 100 MG tablet, Take 1 tablet (100 mg total) by mouth daily., Disp: 90 tablet, Rfl: 1  Allergies  Allergen Reactions   Glucophage [Metformin] Other (See Comments)    Renal damage   Crestor  [Rosuvastatin ] Other (See Comments)    Myalgias     Objective:   BP 136/80   Pulse 67   Temp 98.5 F (36.9 C)   Resp 16   Ht 5' 6 (1.676 m)   Wt 160 lb (72.6 kg)   SpO2 94%   BMI 25.82 kg/m    Physical Exam Vitals reviewed.  Constitutional:      General: She is not in acute distress.    Appearance: Normal appearance. She is not ill-appearing, toxic-appearing or diaphoretic.  HENT:     Head: Normocephalic and atraumatic.     Nose: Nose normal.   Eyes:     General: No scleral icterus.       Right eye: No discharge.  Left eye: No discharge.     Conjunctiva/sclera: Conjunctivae normal.    Cardiovascular:     Rate and Rhythm: Normal rate.  Pulmonary:     Effort: Pulmonary effort is normal. No respiratory distress.   Musculoskeletal:        General: Normal range of motion.     Cervical back: Normal range of motion.   Skin:    General: Skin is warm and dry.     Capillary Refill: Capillary refill takes less than 2 seconds.   Neurological:     General: No focal deficit present.     Mental Status: She is alert and oriented to person, place, and time. Mental status is at baseline.   Psychiatric:        Mood and Affect: Mood normal.        Behavior: Behavior normal.        Thought Content: Thought content normal.        Judgment: Judgment  normal.

## 2023-09-11 NOTE — Patient Instructions (Signed)
 Eat foods that have a lot of iron, such as: Liver. Low-fat (lean) beef. Breads and cereals that have iron added to them. Eggs. Dried fruit. Dark green, leafy vegetables. Eat fresh fruits and vegetables that are high in vitamin C. They help your body use iron. Foods with a lot of vitamin C include: Oranges. Peppers. Tomatoes. Mangoes.

## 2023-09-14 ENCOUNTER — Ambulatory Visit: Payer: Self-pay | Admitting: Family Medicine

## 2023-09-18 ENCOUNTER — Telehealth: Payer: Self-pay

## 2023-09-18 NOTE — Telephone Encounter (Signed)
 Copied from CRM [id number]. Topic: Referral - Question >> Sep 18, 2023 11:28 AM Deaijah H wrote: Reason for CRM: Patient husband called in stating patient has been having Terrible nose bleed and would like a referral

## 2023-09-18 NOTE — Telephone Encounter (Signed)
 Disregard previous message - husband called back after realizing that they already have a referral.

## 2023-09-19 ENCOUNTER — Institutional Professional Consult (permissible substitution) (INDEPENDENT_AMBULATORY_CARE_PROVIDER_SITE_OTHER): Admitting: Physician Assistant

## 2023-09-20 ENCOUNTER — Encounter (INDEPENDENT_AMBULATORY_CARE_PROVIDER_SITE_OTHER): Payer: Self-pay | Admitting: Physician Assistant

## 2023-09-20 ENCOUNTER — Ambulatory Visit (INDEPENDENT_AMBULATORY_CARE_PROVIDER_SITE_OTHER): Admitting: Physician Assistant

## 2023-09-20 VITALS — BP 167/83 | HR 75

## 2023-09-20 DIAGNOSIS — R040 Epistaxis: Secondary | ICD-10-CM | POA: Diagnosis not present

## 2023-09-20 MED ORDER — BACITRACIN 500 UNIT/GM EX OINT
1.0000 | TOPICAL_OINTMENT | Freq: Two times a day (BID) | CUTANEOUS | 0 refills | Status: AC
Start: 2023-09-20 — End: ?

## 2023-09-20 NOTE — Progress Notes (Signed)
 Dear Dr. Merlynn, Here is my assessment for our mutual patient, Danielle Frazier. Thank you for allowing me the opportunity to care for your patient. Please do not hesitate to contact me should you have any other questions. Sincerely, Chyrl Cohen PA-C  Otolaryngology Clinic Note Referring provider: Dr. Merlynn HPI:  Danielle Frazier is a 89 y.o. female kindly referred by Dr. Merlynn   The patient is a 89 year old female seen in our office for evaluation of epistaxis.  The patient notes that she has no history of the same, she notes that approximately 2 weeks ago she started having episodes of epistaxis.  She notes she has had 5 episodes, she reports it is both sides of her nose.  She notes it comes out of the front.  She had to go to the emergency room twice at that time the bleeding had stopped.  She notes she has not had any episodes in the last week.  She does note nasal trauma including nasal picking with some irritation at the anterior naris.  She does note she is also been having hypertension not very well-controlled with her daily antihypertensives, but does note that she has not been consistent with taking the medication.  She denies any pain in the nose, no other facial trauma.  She does note a history of right sided skin lesion that was removed surgically on the right nose that required a graft.  Review shows this was basal cell carcinoma.  She is not on any antiplatelets or anticoagulants.  She does note a history of minimal seasonal allergies.   Independent Review of Additional Tests or Records:  ER visit note on 09/08/2023, ER visit 09/05/2023   PMH/Meds/All/SocHx/FamHx/ROS:   Past Medical History:  Diagnosis Date   Abdominal tenderness, RLQ (right lower quadrant) 04/24/2013   Acute bacterial sinusitis 03/22/2013   Acute bronchitis 03/24/2018   1/20   Anemia associated with acute blood loss 06/04/2018   Bilateral renal cysts 09/07/2011   BRBPR (bright red blood per rectum) 06/04/2018   Cerumen  impaction 03/24/2018   1/20 L   CKD (chronic kidney disease) stage 5, GFR less than 15 ml/min (HCC) 08/25/2008   Estimated Creatinine Clearance: 12.7 mL/min (A) (by C-G formula based on SCr of 3.58 mg/dL (H)).   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    Essential hypertension 08/25/2008   Gastroesophageal reflux disease without esophagitis 07/06/2015   GI bleeding 06/07/2018   Gout    GOUT 08/25/2008        Greater trochanteric bursitis of right hip 11/07/2017   Injected November 07, 2017 repeat injection Jul 13, 2018 repeat injection September 20, 2018   Hip pain, chronic, right 07/23/2012   Dg Hip Complete Right  07/23/2012   *RADIOLOGY REPORT*  Clinical Data: Right hip pain  RIGHT HIP - COMPLETE 2+ VIEW  Comparison: None.  Findings: No fracture or dislocation is seen.  Visualized bony pelvis appears intact.  Bilateral hip joint spaces are symmetric.  Degenerative changes of the lower lumbar spine.  IMPRESSION: No acute osseous abnormality is seen.   Original Report Authenticated By:    Hyperlipidemia    Hyperlipidemia with target LDL less than 100 12/30/2009   Hypertension    Insomnia secondary to chronic pain 07/11/2018  LBP (low back pain)    foraminal narrowing at L4-5 and disc bulge at L5- S1 on MRI 6-08   Lumbar radiculopathy 12/11/2017   Morbid obesity (HCC) 03/22/2013   Otitis media 03/24/2018   1/20 R and possibly L   Persistent atrial fibrillation (HCC) 06/07/2018   Renal insufficiency    Right hip pain 10/16/2017   Shoulder injury 05/21/2012   Urinary incontinence    Uterine fibroid 05/16/2013   UTI (lower urinary tract infection) 04/24/2013     Past Surgical History:  Procedure Laterality Date   COLONOSCOPY WITH PROPOFOL  N/A 11/30/2019   Procedure: COLONOSCOPY WITH PROPOFOL ;   Surgeon: Shila Gustav GAILS, MD;  Location: [facility];  Service: Endoscopy;  Laterality: N/A;   ESOPHAGOGASTRODUODENOSCOPY (EGD) WITH PROPOFOL  N/A 06/08/2018   Procedure: ESOPHAGOGASTRODUODENOSCOPY (EGD) WITH PROPOFOL ;  Surgeon: Eda Iha, MD;  Location: [facility] ENDOSCOPY;  Service: Gastroenterology;  Laterality: N/A;    Family History  Problem Relation Age of Onset   Diabetes Other    Hypertension Other      Social Connections: Socially Isolated (10/21/2022)   Social Connection and Isolation Panel    Frequency of Communication with Friends and Family: Once a week    Frequency of Social Gatherings with Friends and Family: Never    Attends Religious Services: Never    Database administrator or Organizations: No    Attends Engineer, structural: Never    Marital Status: Married      Current Outpatient Medications:    acetaminophen  (TYLENOL ) 500 MG tablet, Take 1,000 mg by mouth as needed for mild pain or headache. , Disp: , Rfl:    allopurinol  (ZYLOPRIM ) 100 MG tablet, Take 1 tablet (100 mg total) by mouth daily., Disp: 90 tablet, Rfl: 1   amLODipine  (NORVASC ) 5 MG tablet, Take 5 mg by mouth daily., Disp: , Rfl:    famotidine  (PEPCID ) 40 MG tablet, Take 40 mg by mouth daily., Disp: , Rfl:    furosemide  (LASIX ) 40 MG tablet, Take 1 tablet (40 mg total) by mouth daily., Disp: 30 tablet, Rfl: 1   linaclotide  (LINZESS ) 145 MCG CAPS capsule, Take 145 mcg by mouth daily before breakfast., Disp: , Rfl:    nebivolol  (BYSTOLIC ) 10 MG tablet, TAKE 1 TABLET BY MOUTH EVERY DAY, Disp: 90 tablet, Rfl: 0   oxymetazoline  (AFRIN NASAL SPRAY) 0.05 % nasal spray, Place 1 spray into both nostrils 2 (two) times daily as needed (Nosebleed)., Disp: 30 mL, Rfl: 0   sodium chloride  (OCEAN) 0.65 % SOLN nasal spray, Place 1 spray into both nostrils as needed for congestion., Disp: 30 mL, Rfl: 0   Physical Exam:   BP (!) 167/83   Pulse 75   SpO2 97%   Pertinent Findings  CN II-XII  intact Bilateral EAC clear and TM intact with well pneumatized middle ear spaces Anterior rhinoscopy: Septum left deviation; bilateral inferior turbinates with minimal hypertrophy, excoriation and scabbing noted to the bilateral anterior naris along the floor of the nose, no active bleeding, no other lesions or superficial vessels No lesions of oral cavity/oropharynx; dentition within normal limits No obviously palpable neck masses/lymphadenopathy/thyromegaly No respiratory distress or stridor  Seprately Identifiable Procedures:  None  Impression & Plans:  Danielle Frazier is a 89 y.o. female with the following   Epistaxis-  89 year old female seen in our office for evaluation of epistaxis.  She does have excoriation along the bilateral nares with some scabbing.  I suspect this is likely the source of her bleeding  as she does admit to digital trauma.  I see no other obvious source of bleeding.  She has not had any bleeds in the last week.  Advised her to use bacitracin  on this followed by Vaseline after 7 days.  If she has rerecurrent bleeds I would like to perform nasal endoscopy to assure her nose posterior lesions.  I have also recommended she reach out to her primary care provider as she has been hypertensive both in our office in the ER this is likely contributing to her bleeding.  This is likely contributing to her bleeding.  Both the patient and her husband verbalized understanding and agreement to today's plan had no further questions or concerns.   - f/u PRN    Thank you for allowing me the opportunity to care for your patient. Please do not hesitate to contact me should you have any other questions.  Sincerely, Chyrl Cohen PA-C [facility] Phone: [phone] Fax: [phone]  09/20/2023, 1:22 PM

## 2023-10-13 ENCOUNTER — Other Ambulatory Visit: Payer: Self-pay | Admitting: Internal Medicine

## 2023-10-13 ENCOUNTER — Ambulatory Visit

## 2023-10-13 VITALS — Ht 66.0 in | Wt 160.0 lb

## 2023-10-13 DIAGNOSIS — Z0000 Encounter for general adult medical examination without abnormal findings: Secondary | ICD-10-CM

## 2023-10-13 DIAGNOSIS — M1A9XX0 Chronic gout, unspecified, without tophus (tophi): Secondary | ICD-10-CM

## 2023-10-13 NOTE — Progress Notes (Addendum)
 Subjective:  Please attest and cosign this visit due to patients primary care provider not being in the office at the time the visit was completed.  (Pt of Dr Debby Molt)   Danielle Frazier is a 89 y.o. who presents for a Medicare Wellness preventive visit.  As a reminder, Annual Wellness Visits don't include a physical exam, and some assessments may be limited, especially if this visit is performed virtually. We may recommend an in-person follow-up visit with your provider if needed.  Visit Complete: Virtual I connected with  Danielle Frazier on 10/13/23 by a audio enabled telemedicine application and verified that I am speaking with the correct person using two identifiers.  Patient Location: Home  Provider Location: Office/Clinic  I discussed the limitations of evaluation and management by telemedicine. The patient expressed understanding and agreed to proceed.  Vital Signs: Because this visit was a virtual/telehealth visit, some criteria may be missing or patient reported. Any vitals not documented were not able to be obtained and vitals that have been documented are patient reported.  VideoDeclined- This patient declined Librarian, academic. Therefore the visit was completed with audio only.  Persons Participating in Visit: Patient.  AWV Questionnaire: No: Patient Medicare AWV questionnaire was not completed prior to this visit.  Cardiac Risk Factors include: advanced age (>64men, >63 women);dyslipidemia;hypertension     Objective:    Today's Vitals   10/13/23 1504  Weight: 160 lb (72.6 kg)  Height: 5' 6 (1.676 m)   Body mass index is 25.82 kg/m.     10/13/2023    3:03 PM 09/08/2023   10:25 AM 10/21/2022    1:13 PM 01/20/2020   11:46 AM 11/28/2019    2:28 PM 11/28/2019    5:04 AM 06/07/2018    1:28 PM  Advanced Directives  Does Patient Have a Medical Advance Directive? No No Yes No  No No   Type of Advance Directive   Out of facility DNR (pink  MOST or yellow form)      Copy of Healthcare Power of Attorney in Chart?   No - copy requested      Would patient like information on creating a medical advance directive? No - Patient declined No - Patient declined  No - Patient declined No - Patient declined  No - Patient declined      Data saved with a previous flowsheet row definition    Current Medications (verified) Outpatient Encounter Medications as of 10/13/2023  Medication Sig   acetaminophen  (TYLENOL ) 500 MG tablet Take 1,000 mg by mouth as needed for mild pain or headache.    allopurinol  (ZYLOPRIM ) 100 MG tablet Take 1 tablet (100 mg total) by mouth daily.   amLODipine  (NORVASC ) 5 MG tablet Take 5 mg by mouth daily.   bacitracin  500 UNIT/GM ointment Apply 1 Application topically 2 (two) times daily.   famotidine  (PEPCID ) 40 MG tablet Take 40 mg by mouth daily.   furosemide  (LASIX ) 40 MG tablet Take 1 tablet (40 mg total) by mouth daily.   linaclotide  (LINZESS ) 145 MCG CAPS capsule Take 145 mcg by mouth daily before breakfast.   nebivolol  (BYSTOLIC ) 10 MG tablet TAKE 1 TABLET BY MOUTH EVERY DAY   oxymetazoline  (AFRIN NASAL SPRAY) 0.05 % nasal spray Place 1 spray into both nostrils 2 (two) times daily as needed (Nosebleed).   sodium chloride  (OCEAN) 0.65 % SOLN nasal spray Place 1 spray into both nostrils as needed for congestion.   No facility-administered encounter  medications on file as of 10/13/2023.    Allergies (verified) Glucophage [metformin] and Crestor  [rosuvastatin ]   History: Past Medical History:  Diagnosis Date   Abdominal tenderness, RLQ (right lower quadrant) 04/24/2013   Acute bacterial sinusitis 03/22/2013   Acute bronchitis 03/24/2018   1/20   Anemia associated with acute blood loss 06/04/2018   Bilateral renal cysts 09/07/2011   BRBPR (bright red blood per rectum) 06/04/2018   Cerumen impaction 03/24/2018   1/20 L   CKD (chronic kidney disease) stage 5, GFR less than 15 ml/min (HCC) 08/25/2008   Estimated  Creatinine Clearance: 12.7 mL/min (A) (by C-G formula based on SCr of 3.58 mg/dL (H)).   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    Essential hypertension 08/25/2008   Gastroesophageal reflux disease without esophagitis 07/06/2015   GI bleeding 06/07/2018   Gout    GOUT 08/25/2008        Greater trochanteric bursitis of right hip 11/07/2017   Injected November 07, 2017 repeat injection Jul 13, 2018 repeat injection September 20, 2018   Hip pain, chronic, right 07/23/2012   Dg Hip Complete Right  07/23/2012   *RADIOLOGY REPORT*  Clinical Data: Right hip pain  RIGHT HIP - COMPLETE 2+ VIEW  Comparison: None.  Findings: No fracture or dislocation is seen.  Visualized bony pelvis appears intact.  Bilateral hip joint spaces are symmetric.  Degenerative changes of the lower lumbar spine.  IMPRESSION: No acute osseous abnormality is seen.   Original Report Authenticated By:    Hyperlipidemia    Hyperlipidemia with target LDL less than 100 12/30/2009   Hypertension    Insomnia secondary to chronic pain 07/11/2018   LBP (low back pain)    foraminal narrowing at L4-5 and disc bulge at L5- S1 on MRI 6-08   Lumbar radiculopathy 12/11/2017   Morbid obesity (HCC) 03/22/2013   Otitis media 03/24/2018   1/20 R and possibly L   Persistent atrial fibrillation (HCC) 06/07/2018   Renal insufficiency    Right hip pain 10/16/2017   Shoulder injury 05/21/2012   Urinary incontinence    Uterine fibroid 05/16/2013   UTI (lower urinary tract infection) 04/24/2013   Past Surgical History:  Procedure Laterality Date   COLONOSCOPY WITH PROPOFOL  N/A 11/30/2019   Procedure: COLONOSCOPY WITH PROPOFOL ;  Surgeon: Shila Gustav GAILS, MD;  Location: [facility];  Service: Endoscopy;  Laterality: N/A;   ESOPHAGOGASTRODUODENOSCOPY  (EGD) WITH PROPOFOL  N/A 06/08/2018   Procedure: ESOPHAGOGASTRODUODENOSCOPY (EGD) WITH PROPOFOL ;  Surgeon: Eda Iha, MD;  Location: [facility];  Service: Gastroenterology;  Laterality: N/A;   Family History  Problem Relation Age of Onset   Diabetes Other    Hypertension Other    Social History   Socioeconomic History   Marital status: Married    Spouse name: [name]   Number of children: 0   Years of education: Not on file   Highest education level: Not on file  Occupational History   Not on file  Tobacco Use   Smoking status: Never   Smokeless tobacco: Never  Vaping Use   Vaping status: Never Used  Substance and Sexual Activity   Alcohol use: No   Drug use: No   Sexual activity: Not Currently  Other Topics Concern   Not on  file  Social History Narrative   Married   Social Drivers of Corporate investment banker Strain: Low Risk  (10/13/2023)   Overall Financial Resource Strain (CARDIA)    Difficulty of Paying Living Expenses: Not hard at all  Food Insecurity: No Food Insecurity (10/13/2023)   Hunger Vital Sign    Worried About Running Out of Food in the Last Year: Never true    Ran Out of Food in the Last Year: Never true  Transportation Needs: No Transportation Needs (10/13/2023)   PRAPARE - Administrator, Civil Service (Medical): No    Lack of Transportation (Non-Medical): No  Physical Activity: Inactive (10/13/2023)   Exercise Vital Sign    Days of Exercise per Week: 0 days    Minutes of Exercise per Session: 0 min  Stress: No Stress Concern Present (10/13/2023)   [employer] of Occupational Health - Occupational Stress Questionnaire    Feeling of Stress: Not at all  Social Connections: Moderately Isolated (10/13/2023)   Social Connection and Isolation Panel    Frequency of Communication with Friends and Family: Three times a week    Frequency of Social Gatherings with Friends and Family: Never    Attends Religious Services: Never    Automotive engineer or Organizations: No    Attends Engineer, structural: Never    Marital Status: Married    Tobacco Counseling Counseling given: No    Clinical Intake:  Pre-visit preparation completed: Yes  Pain : No/denies pain     BMI - recorded: 25.82 Nutritional Status: BMI 25 -29 Overweight Nutritional Risks: None Diabetes: No  Lab Results  Component Value Date   HGBA1C 5.5 06/11/2019   HGBA1C 6.0 06/04/2018   HGBA1C 6.1 01/08/2018     How often do you need to have someone help you when you read instructions, pamphlets, or other written materials from your doctor or pharmacy?: 1 - Never  Interpreter Needed?: No  Information entered by :: Danielle Frazier, CMA   Activities of Daily Living     10/13/2023    3:06 PM 10/21/2022    1:09 PM  In your present state of health, do you have any difficulty performing the following activities:  Hearing? 0 0  Vision? 0 0  Difficulty concentrating or making decisions? 0 0  Walking or climbing stairs? 0 0  Dressing or bathing? 0 1  Doing errands, shopping? 0 1  Comment  Her husband drives her.  Preparing Food and eating ? N N  Using the Toilet? N N  In the past six months, have you accidently leaked urine? Danielle Frazier Danielle Frazier  Comment wears a pad/pantyliner   Do you have problems with loss of bowel control? N N  Managing your Medications? N N  Managing your Finances? N N  Housekeeping or managing your Housekeeping? N N    Patient Care Team: Joshua Debby CROME, MD as PCP - General Nahser, Aleene PARAS, MD (Inactive) as PCP - Cardiology (Cardiology) Fate Morna SAILOR, [facility] (Inactive) as Pharmacist (Pharmacist)  I have updated your Care Teams any recent Medical Services you may have received from other providers in the past year.     Assessment:   This is a routine wellness examination for Danielle Frazier.  Hearing/Vision screen Hearing Screening - Comments:: Denies hearing difficulties   Vision Screening - Comments:: Wears rx glasses -  pt plans to schedule an appt with [facility]   Goals Addressed  This Visit's Progress     Patient Stated (pt-stated)        Patient stated she plans to walk a little more with her walker       Depression Screen     10/13/2023    3:08 PM 09/11/2023    2:34 PM 01/09/2023    3:38 PM 10/21/2022    1:19 PM 04/20/2022    9:45 AM 02/22/2021    1:22 PM 01/20/2020   11:34 AM  PHQ 2/9 Scores  PHQ - 2 Score 0 0 0 0 0 0 0  PHQ- 9 Score 0 0 2 1       Fall Risk     10/13/2023    3:07 PM 09/11/2023    2:33 PM 01/09/2023    3:37 PM 10/21/2022    1:14 PM 04/20/2022    9:44 AM  Fall Risk   Falls in the past year? 0 1 0 1 0  Comment    lost balance   Number falls in past yr: 0 1 0  0  Injury with Fall? 0 0 0 0 0  Risk for fall due to : No Fall Risks Impaired balance/gait No Fall Risks No Fall Risks No Fall Risks  Follow up Falls evaluation completed;Falls prevention discussed Falls evaluation completed Falls evaluation completed Falls evaluation completed;Falls prevention discussed Falls evaluation completed    MEDICARE RISK AT HOME:  Medicare Risk at Home Any stairs in or around the home?: No If so, are there any without handrails?: No Home free of loose throw rugs in walkways, pet beds, electrical cords, etc?: Yes Adequate lighting in your home to reduce risk of falls?: Yes Life alert?: No Use of a cane, walker or w/c?: Yes (walker) Grab bars in the bathroom?: Yes Shower chair or bench in shower?: Yes Elevated toilet seat or a handicapped toilet?: Yes  TIMED UP AND GO:  Was the test performed?  No  Cognitive Function: 6CIT completed    07/06/2015   10:05 AM  MMSE - Mini Mental State Exam  Not completed: --        10/13/2023    3:19 PM 10/21/2022    1:15 PM 01/20/2020   11:47 AM  6CIT Screen  What Year? 0 points 0 points 0 points  What month? 0 points 0 points 0 points  What time? 0 points 0 points 3 points  Count back from 20 0 points 0 points 0 points  Months in  reverse 4 points 0 points 0 points  Repeat phrase 6 points 0 points 0 points  Total Score 10 points 0 points 3 points    Immunizations Immunization History  Administered Date(s) Administered   Fluad Quad(high Dose 65+) 12/12/2018, 12/06/2019, 02/17/2022   Fluad Trivalent(High Dose 65+) 01/09/2023   Influenza Split 01/04/2012   Influenza, High Dose Seasonal PF 01/01/2015, 01/05/2016, 01/04/2017, 01/08/2018   Influenza,inj,Quad PF,6+ Mos 12/31/2013   Influenza-Unspecified 12/12/2012, 01/12/2021   Moderna Sars-Covid-2 Vaccination 10/07/2019, 11/04/2019   Pneumococcal Conjugate-13 08/20/2013   Pneumococcal Polysaccharide-23 08/25/2008, 07/06/2015, 02/17/2022   Td 08/25/2008   Tdap 11/12/2018    Screening Tests Health Maintenance  Topic Date Due   HEMOGLOBIN A1C  12/12/2019   FOOT EXAM  04/21/2023   OPHTHALMOLOGY EXAM  05/11/2023   INFLUENZA VACCINE  10/13/2023   Medicare Annual Wellness (AWV)  10/12/2024   DTaP/Tdap/Td (3 - Td or Tdap) 11/11/2028   Pneumococcal Vaccine: 50+ Years  Completed   DEXA SCAN  Completed   Hepatitis B  Vaccines  Aged Out   HPV VACCINES  Aged Out   Meningococcal B Vaccine  Aged Out   COVID-19 Vaccine  Discontinued   Zoster Vaccines- Shingrix  Discontinued    Health Maintenance  Health Maintenance Due  Topic Date Due   HEMOGLOBIN A1C  12/12/2019   FOOT EXAM  04/21/2023   OPHTHALMOLOGY EXAM  05/11/2023   INFLUENZA VACCINE  10/13/2023   Health Maintenance Items Addressed: 10/13/2023  Additional Screening:  Vision Screening: Recommended annual ophthalmology exams for early detection of glaucoma and other disorders of the eye. Would you like a referral to an eye doctor? No  Patient plans to schedule an appt w/[facility] in [place], [place] for an eye exam in 2025.  Dental Screening: Recommended annual dental exams for proper oral hygiene  Community Resource Referral / Chronic Care Management: CRR required this visit?  No   CCM required this visit?   No   Plan:    I have personally reviewed and noted the following in the patient's chart:   Medical and social history Use of alcohol, tobacco or illicit drugs  Current medications and supplements including opioid prescriptions. Patient is not currently taking opioid prescriptions. Functional ability and status Nutritional status Physical activity Advanced directives List of other physicians Hospitalizations, surgeries, and ER visits in previous 12 months Vitals Screenings to include cognitive, depression, and falls Referrals and appointments  In addition, I have reviewed and discussed with patient certain preventive protocols, quality metrics, and best practice recommendations. A written personalized care plan for preventive services as well as general preventive health recommendations were provided to patient.   Danielle CHRISTELLA Frazier, CMA   10/13/2023   After Visit Summary: (Declined) Due to this being a telephonic visit, with patients personalized plan was offered to patient but patient Declined AVS at this time   Notes: See Routing Comments. Cognitive Score: 10pts. Scheduled a 1-yr f/u appt w/PCP for 01/10/2024.   Medical screening examination/treatment/procedure(s) were performed by non-physician practitioner and as supervising physician I was immediately available for consultation/collaboration.  I agree with above. Karlynn Noel, MD

## 2023-10-13 NOTE — Patient Instructions (Addendum)
 Danielle Frazier , Thank you for taking time out of your busy schedule to complete your Annual Wellness Visit with me. I enjoyed our conversation and look forward to speaking with you again next year. I, as well as your care team,  appreciate your ongoing commitment to your health goals. Please review the following plan we discussed and let me know if I can assist you in the future. Your Game plan/ To Do List    Referrals: If you haven't heard from the office you've been referred to, please reach out to them at the phone provided.   Follow up Visits: We will see or speak with you next year for your Next Medicare AWV with our clinical staff Have you seen your provider in the last 6 months (3 months if uncontrolled diabetes)? No  Clinician Recommendations:  Aim for 30 minutes of exercise or brisk walking, 6-8 glasses of water, and 5 servings of fruits and vegetables each day.       This is a list of the screenings recommended for you:  Health Maintenance  Topic Date Due   Hemoglobin A1C  12/12/2019   Complete foot exam   04/21/2023   Eye exam for diabetics  05/11/2023   Flu Shot  10/13/2023   Medicare Annual Wellness Visit  10/12/2024   DTaP/Tdap/Td vaccine (3 - Td or Tdap) 11/11/2028   Pneumococcal Vaccine for age over 38  Completed   DEXA scan (bone density measurement)  Completed   Hepatitis B Vaccine  Aged Out   HPV Vaccine  Aged Out   Meningitis B Vaccine  Aged Out   COVID-19 Vaccine  Discontinued   Zoster (Shingles) Vaccine  Discontinued    Advanced directives: (Declined) Advance directive discussed with you today. Even though you declined this today, please call our office should you change your mind, and we can give you the proper paperwork for you to fill out. Advance Care Planning is important because it:  [x]  Makes sure you receive the medical care that is consistent with your values, goals, and preferences  [x]  It provides guidance to your family and loved ones and reduces  their decisional burden about whether or not they are making the right decisions based on your wishes.  Follow the link provided in your after visit summary or read over the paperwork we have mailed to you to help you started getting your Advance Directives in place. If you need assistance in completing these, please reach out to us  so that we can help you!

## 2023-10-17 NOTE — Telephone Encounter (Signed)
 Last OV 09/11/23 Next OV 01/10/24  Last refill 10/21/22 Qty #90/1

## 2023-11-09 ENCOUNTER — Other Ambulatory Visit: Payer: Self-pay | Admitting: Internal Medicine

## 2023-11-09 DIAGNOSIS — I4819 Other persistent atrial fibrillation: Secondary | ICD-10-CM

## 2023-11-09 DIAGNOSIS — I10 Essential (primary) hypertension: Secondary | ICD-10-CM

## 2023-11-09 DIAGNOSIS — N185 Chronic kidney disease, stage 5: Secondary | ICD-10-CM

## 2023-11-09 MED ORDER — FUROSEMIDE 40 MG PO TABS: 40.0000 mg | ORAL_TABLET | Freq: Every day | ORAL | 1 refills | Status: DC

## 2023-11-09 MED ORDER — NEBIVOLOL HCL 10 MG PO TABS: 10.0000 mg | ORAL_TABLET | Freq: Every day | ORAL | 0 refills | Status: AC

## 2024-01-10 ENCOUNTER — Telehealth: Payer: Self-pay

## 2024-01-10 ENCOUNTER — Encounter: Payer: Self-pay | Admitting: Internal Medicine

## 2024-01-10 ENCOUNTER — Ambulatory Visit: Admitting: Internal Medicine

## 2024-01-10 VITALS — BP 156/76 | HR 66 | Temp 97.9°F | Resp 16 | Ht 66.0 in | Wt 148.2 lb

## 2024-01-10 DIAGNOSIS — R739 Hyperglycemia, unspecified: Secondary | ICD-10-CM | POA: Diagnosis not present

## 2024-01-10 DIAGNOSIS — Z0000 Encounter for general adult medical examination without abnormal findings: Secondary | ICD-10-CM | POA: Diagnosis not present

## 2024-01-10 DIAGNOSIS — I10 Essential (primary) hypertension: Secondary | ICD-10-CM

## 2024-01-10 DIAGNOSIS — M1A9XX0 Chronic gout, unspecified, without tophus (tophi): Secondary | ICD-10-CM | POA: Diagnosis not present

## 2024-01-10 DIAGNOSIS — Z23 Encounter for immunization: Secondary | ICD-10-CM

## 2024-01-10 DIAGNOSIS — D631 Anemia in chronic kidney disease: Secondary | ICD-10-CM | POA: Diagnosis not present

## 2024-01-10 DIAGNOSIS — Z0001 Encounter for general adult medical examination with abnormal findings: Secondary | ICD-10-CM | POA: Insufficient documentation

## 2024-01-10 DIAGNOSIS — N185 Chronic kidney disease, stage 5: Secondary | ICD-10-CM

## 2024-01-10 LAB — CBC WITH DIFFERENTIAL/PLATELET
Basophils Absolute: 0 K/uL (ref 0.0–0.1)
Basophils Relative: 0.5 % (ref 0.0–3.0)
Eosinophils Absolute: 0.1 K/uL (ref 0.0–0.7)
Eosinophils Relative: 1.9 % (ref 0.0–5.0)
HCT: 27.8 % — ABNORMAL LOW (ref 36.0–46.0)
Hemoglobin: 8.6 g/dL — ABNORMAL LOW (ref 12.0–15.0)
Lymphocytes Relative: 18.8 % (ref 12.0–46.0)
Lymphs Abs: 1 K/uL (ref 0.7–4.0)
MCHC: 31 g/dL (ref 30.0–36.0)
MCV: 95.7 fl (ref 78.0–100.0)
Monocytes Absolute: 0.4 K/uL (ref 0.1–1.0)
Monocytes Relative: 8.1 % (ref 3.0–12.0)
Neutro Abs: 3.8 K/uL (ref 1.4–7.7)
Neutrophils Relative %: 70.7 % (ref 43.0–77.0)
Platelets: 181 K/uL (ref 150.0–400.0)
RBC: 2.91 Mil/uL — ABNORMAL LOW (ref 3.87–5.11)
RDW: 17.9 % — ABNORMAL HIGH (ref 11.5–15.5)
WBC: 5.4 K/uL (ref 4.0–10.5)

## 2024-01-10 LAB — HEPATIC FUNCTION PANEL
ALT: 5 U/L (ref 0–35)
AST: 11 U/L (ref 0–37)
Albumin: 3.5 g/dL (ref 3.5–5.2)
Alkaline Phosphatase: 127 U/L — ABNORMAL HIGH (ref 39–117)
Bilirubin, Direct: 0.2 mg/dL (ref 0.0–0.3)
Total Bilirubin: 0.5 mg/dL (ref 0.2–1.2)
Total Protein: 6.3 g/dL (ref 6.0–8.3)

## 2024-01-10 LAB — BASIC METABOLIC PANEL WITH GFR
BUN: 58 mg/dL — ABNORMAL HIGH (ref 6–23)
CO2: 22 meq/L (ref 19–32)
Calcium: 9.3 mg/dL (ref 8.4–10.5)
Chloride: 110 meq/L (ref 96–112)
Creatinine, Ser: 5.54 mg/dL (ref 0.40–1.20)
GFR: 6.34 mL/min — CL (ref >60.00)
Glucose, Bld: 90 mg/dL (ref 70–99)
Potassium: 3.9 meq/L (ref 3.5–5.1)
Sodium: 143 meq/L (ref 135–145)

## 2024-01-10 LAB — HEMOGLOBIN A1C: Hgb A1c MFr Bld: 5.6 % (ref 4.6–6.5)

## 2024-01-10 LAB — URIC ACID: Uric Acid, Serum: 6.8 mg/dL (ref 2.4–7.0)

## 2024-01-10 LAB — TSH: TSH: 7.62 u[IU]/mL — ABNORMAL HIGH (ref 0.35–5.50)

## 2024-01-10 MED ORDER — FUROSEMIDE 40 MG PO TABS: 40.0000 mg | ORAL_TABLET | Freq: Two times a day (BID) | ORAL | 1 refills | Status: AC

## 2024-01-10 MED ORDER — AMLODIPINE BESYLATE 5 MG PO TABS: 5.0000 mg | ORAL_TABLET | Freq: Every day | ORAL | 1 refills | Status: AC

## 2024-01-10 NOTE — Telephone Encounter (Signed)
 CRITICAL VALUE STICKER  CRITICAL VALUE: Creatine 5.54 GFR 6.34  RECEIVER (on-site recipient of call): Chadrick Sprinkle Stubbs-Barnette  DATE & TIME NOTIFIED: 01/10/24 at 1:03  MESSENGER (representative from lab): Saa  MD NOTIFIED: Isabelle Molt  TIME OF NOTIFICATION: 1:05  RESPONSE:  Due to patients health status this is a normal result for her

## 2024-01-10 NOTE — Progress Notes (Signed)
 Subjective:  Patient ID: Danielle Frazier, female    DOB: Dec 23, 1930  Age: 89 y.o. MRN: [id number]  CC: Anemia, Hypertension, Diabetes, and Annual Exam   HPI Danielle Frazier presents for a CPX and f/up ---  Discussed the use of AI scribe software for clinical note transcription with the patient, who gave verbal consent to proceed.  History of Present Illness Danielle Frazier is a 89 year old female who presents with medication refill and evaluation of chronic cough and leg swelling.  She has a chronic dry cough that occurs primarily in the morning and has been present for a long time. There is no hemoptysis or sputum production. The cough is not a new symptom for her.  She ran out of her medications, including her diuretic, a day or two ago. Her legs have been swollen for a couple of days, which she attributes to the lack of medication. She was unaware of the swelling until recently.  She reports no recent weight loss despite trying. Her appetite is poor, and she describes her eating habits as 'just like a bird.'  She denies any stomach pain and states that nothing hurts as long as she is sitting still. She mentions that her mobility is limited, as her 'old bones' do not work well when she starts walking.  She is unsure about her vaccination status, believing she had received a flu shot but acknowledging she might not have.     Outpatient Medications Prior to Visit  Medication Sig Dispense Refill   acetaminophen  (TYLENOL ) 500 MG tablet Take 1,000 mg by mouth as needed for mild pain or headache.      allopurinol  (ZYLOPRIM ) 100 MG tablet TAKE 1 TABLET BY MOUTH EVERY DAY 90 tablet 1   bacitracin  500 UNIT/GM ointment Apply 1 Application topically 2 (two) times daily. 15 g 0   famotidine  (PEPCID ) 40 MG tablet Take 40 mg by mouth daily.     linaclotide  (LINZESS ) 145 MCG CAPS capsule Take 145 mcg by mouth daily before breakfast.     nebivolol  (BYSTOLIC ) 10 MG tablet Take 1 tablet (10 mg total)  by mouth daily. 90 tablet 0   sodium chloride  (OCEAN) 0.65 % SOLN nasal spray Place 1 spray into both nostrils as needed for congestion. 30 mL 0   amLODipine  (NORVASC ) 5 MG tablet Take 5 mg by mouth daily.     furosemide  (LASIX ) 40 MG tablet Take 1 tablet (40 mg total) by mouth daily. 30 tablet 1   No facility-administered medications prior to visit.    ROS Review of Systems  Constitutional:  Positive for fatigue and unexpected weight change (wt loss). Negative for appetite change, chills, diaphoresis and fever.  HENT: Negative.  Negative for sore throat and trouble swallowing.   Eyes: Negative.   Respiratory:  Positive for cough.   Cardiovascular:  Positive for leg swelling.  Gastrointestinal:  Negative for abdominal pain, constipation, diarrhea, nausea and vomiting.  Endocrine: Negative.   Genitourinary: Negative.  Negative for difficulty urinating and dysuria.  Musculoskeletal:  Positive for gait problem. Negative for arthralgias and myalgias.  Neurological:  Negative for dizziness, weakness and light-headedness.  Hematological:  Negative for adenopathy. Does not bruise/bleed easily.  Psychiatric/Behavioral: Negative.      Objective:  BP (!) 156/76 (BP Location: Left Arm, Patient Position: Sitting, Cuff Size: Normal)   Pulse 66   Temp 97.9 F (36.6 C) (Oral)   Resp 16   Ht 5' 6 (1.676 m)   Wt  148 lb 3.2 oz (67.2 kg)   SpO2 94%   BMI 23.92 kg/m   BP Readings from Last 3 Encounters:  01/10/24 (!) 156/76  09/20/23 (!) 167/83  09/11/23 136/80    Wt Readings from Last 3 Encounters:  01/10/24 148 lb 3.2 oz (67.2 kg)  10/13/23 160 lb (72.6 kg)  09/11/23 160 lb (72.6 kg)    Physical Exam Vitals reviewed.  Constitutional:      General: She is not in acute distress.    Appearance: She is underweight. She is ill-appearing (in a wheelchair). She is not toxic-appearing or diaphoretic.  HENT:     Nose: Nose normal.     Mouth/Throat:     Mouth: Mucous membranes are moist.   Eyes:     General: No scleral icterus.    Conjunctiva/sclera: Conjunctivae normal.  Cardiovascular:     Rate and Rhythm: Normal rate and regular rhythm. Occasional Extrasystoles are present.    Pulses: Normal pulses.     Heart sounds: No murmur heard.    No friction rub. No gallop.  Pulmonary:     Effort: Pulmonary effort is normal.     Breath sounds: No stridor. No decreased breath sounds, wheezing, rhonchi or rales.  Abdominal:     General: Abdomen is flat.     Palpations: There is no mass.     Tenderness: There is no abdominal tenderness. There is no guarding.     Hernia: No hernia is present.  Musculoskeletal:        General: No swelling.     Cervical back: Neck supple.     Right lower leg: 3+ Edema present.     Left lower leg: 3+ Edema present.  Skin:    General: Skin is warm and dry.  Neurological:     Mental Status: She is alert. Mental status is at baseline.     Lab Results  Component Value Date   WBC 5.4 01/10/2024   HGB 8.6 Repeated and verified X2. (L) 01/10/2024   HCT 27.8 (L) 01/10/2024   PLT 181.0 01/10/2024   GLUCOSE 90 01/10/2024   CHOL 154 11/12/2018   TRIG 123.0 11/12/2018   HDL 52.20 11/12/2018   LDLDIRECT 90.0 07/06/2015   LDLCALC 78 11/12/2018   ALT 5 01/10/2024   AST 11 01/10/2024   NA 143 01/10/2024   K 3.9 01/10/2024   CL 110 01/10/2024   CREATININE 5.54 (HH) 01/10/2024   BUN 58 (H) 01/10/2024   CO2 22 01/10/2024   TSH 7.62 (H) 01/10/2024   INR 1.2 11/28/2019   HGBA1C 5.6 01/10/2024    No results found.  Assessment & Plan:   Need for immunization against influenza -     Flu vaccine HIGH DOSE PF(Fluzone Trivalent)  CKD (chronic kidney disease) stage 5, GFR less than 15 ml/min (HCC)- Renal function has declined. She is not willing to start HD. -     Basic metabolic panel with GFR; Future -     CBC with Differential/Platelet; Future -     Hepatic function panel; Future -     Furosemide ; Take 1 tablet (40 mg total) by mouth 2 (two)  times daily.  Dispense: 180 tablet; Refill: 1  Essential hypertension- BP is adequately well controlled. -     Basic metabolic panel with GFR; Future -     TSH; Future -     Hepatic function panel; Future -     amLODIPine  Besylate; Take 1 tablet (5 mg total) by mouth  daily.  Dispense: 90 tablet; Refill: 1 -     Furosemide ; Take 1 tablet (40 mg total) by mouth 2 (two) times daily.  Dispense: 180 tablet; Refill: 1  Chronic gout without tophus, unspecified cause, unspecified site -     Basic metabolic panel with GFR; Future -     Uric acid; Future  Anemia due to stage 5 chronic kidney disease (HCC)- H/H are stable. -     CBC with Differential/Platelet; Future  Chronic hyperglycemia -     Hemoglobin A1c; Future  Encounter for general adult medical examination with abnormal findings- Exam completed, labs reviewed, vaccines reviewed and updated, no cancer screenings indicated, pt ed material was given.      Follow-up: Return if symptoms worsen or fail to improve.  Debby Molt, MD

## 2024-01-10 NOTE — Patient Instructions (Signed)
 Chronic Kidney Disease in Adults: What to Know Chronic kidney disease (CKD) is when lasting damage happens to the kidneys slowly over time. The kidneys are two organs that do many important things in the body. These include: Taking waste and extra fluid out of the blood to make pee (urine). Making hormones. Keeping the right amount of fluids and chemicals in the body. A small amount of kidney damage may not cause problems. You must take steps to help keep the kidney damage from getting worse. A lot of damage may cause kidney failure. Kidney failure means the kidneys can no longer work right. What are the causes? Diabetes. High blood pressure. Diseases that affect the heart and blood vessels. Other kidney diseases. Diseases that affect the body's defense system (immune system). A problem with the flow of pee. This may be caused by: Kidney stones. Cancer. An enlarged prostate, in males. A kidney infection or urinary tract infection (UTI) that keeps coming back. What increases the risk? Getting older. The chances of having CKD increase with age. A family history of kidney disease or kidney failure. Having a disease caused by genes. Taking medicines that can harm the kidneys. Being near or having contact with harmful substances. Being very overweight. Using tobacco now or in the past. What are the signs or symptoms? Common symptoms of CKD include: Feeling very tired and having less energy. Swelling of the face, legs, ankles, or feet. Throwing up or feeling like you may throw up. Not wanting to eat as much as normal. Being confused or not able to focus. Twitches and cramps in the leg muscles or other muscles. Dry, itchy skin. Other symptoms may include: Shortness of breath. Trouble sleeping. Making less pee, or making more pee, especially at night. A taste of metal in your mouth. You may also become anemic. Anemia means there's not enough red blood cells in your blood. You may get  symptoms slowly. You may not notice them until the kidney damage gets very bad. How is this diagnosed? CKD may be diagnosed based on: Tests on your blood or pee. Imaging tests, like an ultrasound or a CT scan. A kidney biopsy. For this test, a sample of kidney tissue is removed to be looked at under a microscope. These tests will help to find out how serious the CKD is. How is this treated? Often, there's no cure for CKD. Treatment can help with symptoms and help keep the disease from getting worse. Treatment may include: Treating other problems that are causing your CKD or making it worse. Diet changes. You may need to: Avoid alcohol. Avoid foods that are high in salt, potassium, phosphorous, and protein. Taking medicines for symptoms and to help control other conditions. Dialysis. This treatment gets harmful waste out of your body. It may be needed if you have kidney failure. Follow these instructions at home: Medicines Take your medicines only as told. The amount of some medicines you take may need to be changed. Do not take any new medicines, vitamins, or supplements unless your health care provider says it's okay. These may make kidney damage worse. Lifestyle Do not smoke, vape, or use nicotine or tobacco. If you drink alcohol: Limit how much you have to: 0-1 drink a day if you're female. 0-2 drinks a day if you're female. Know how much alcohol is in your drink. In the [place] one drink is one 12 oz bottle of beer (355 mL), one 5 oz glass of wine (148 mL), or one 1 oz  glass of hard liquor (44 mL). Stay at a healthy weight. If you need help, ask your provider. General instructions  Eat and drink as told. Track your blood pressure at home. Tell your provider about any changes. If you have diabetes, track your blood sugar as told. Exercise at least 30 minutes a day, 5 days a week. Keep your shots (vaccinations) up to date. Keep all follow-up visits. Your provider may need to change  your treatments over time. Where to find support American Kidney Fund: [place].com.[employer] Kidney School: kidneyschool.org American Association of Kidney Patients: https://www.miller-montoya.com/ Where to find more information National Kidney Foundation: kidney.org Centers for Disease Control and Prevention. To learn more: Go to DiningCalendar.de. Click "Search". Type "chronic kidney disease" in the search box. Contact a health care provider if: You have new symptoms. You get symptoms of end-stage kidney disease. These include: Headaches. Numbness in your hands or feet. Leg cramps. Easy bruising. Get help right away if: You have a fever. You make less pee than usual. You have pain or bleeding when you pee or poop. You have chest pain. You have shortness of breath. These symptoms may be an emergency. Call 911 right away. Do not wait to see if the symptoms will go away. Do not drive yourself to the hospital. This information is not intended to replace advice given to you by your health care provider. Make sure you discuss any questions you have with your health care provider. Document Revised: 01/10/2023 Document Reviewed: 09/02/2022 Elsevier Patient Education  2024 [employer].

## 2024-02-19 ENCOUNTER — Ambulatory Visit: Payer: Self-pay

## 2024-02-19 NOTE — Telephone Encounter (Signed)
 FYI Only or Action Required?: FYI only for provider: appointment scheduled on 02/20/2024.  Patient was last seen in primary care on 01/10/2024 by Joshua Debby CROME, MD.  Called Nurse Triage reporting Wound Check.  Symptoms began about a month ago.  Symptoms are: unchanged.  Triage Disposition: See PCP When Office is Open (Within 3 Days)  Patient/caregiver understands and will follow disposition?: Yes      Reason for Disposition  [1] After 14 days AND [2] wound isn't healed  Answer Assessment - Initial Assessment Questions This RN spoke with pt's husband, Winford. Pt fell a while ago and now has a wound on both legs above pt's heel. About the size of a silver dollar.  Color: red spots; pt's husband concern is that it hasn't healed   Pt husband denies signs of infection Swelling in legs intermittent, chronic issue  Fall occurrence: over a month Cause of fall: fell going down steps Denies fever, pain  Protocols used: Skin Injury-A-AH

## 2024-02-19 NOTE — Telephone Encounter (Signed)
 Copied from CRM [id number]. Topic: Clinical - Red Word Triage >> Feb 19, 2024  9:37 AM Danielle Frazier wrote: Red Word that prompted transfer to Nurse Triage: Husband Danielle Frazier calling to say wife Danielle Frazier has a wound that doesn't look good, needs to be seen as soon as possible ([facility]). Clemens a while ago and had medicine to put on wound, now there is a thick scab, concerned for infection. #[id number]

## 2024-02-20 ENCOUNTER — Ambulatory Visit: Admitting: Internal Medicine

## 2024-02-20 ENCOUNTER — Encounter: Payer: Self-pay | Admitting: Internal Medicine

## 2024-02-20 NOTE — Progress Notes (Unsigned)
   Subjective:   Patient ID: Danielle Frazier, female    DOB: June 23, 1930, 89 y.o.   MRN: [id number]  Discussed the use of AI scribe software for clinical note transcription with the patient, who gave verbal consent to proceed.  History of Present Illness Danielle Frazier is a 89 year old female with kidney disease who presents with non-healing wounds on her legs. She is accompanied by her caregiver, Edsel.  She has non-healing wounds on the backside of her legs that have persisted for over a month. Initially, the wounds healed but then reopened. They are described as hard and occasionally exuding a yellowish scab. The wounds are not painful unless weight is applied, and they have begun to itch slightly. No recent drainage, fever, or chills are reported.  The wounds originated after she fell down the steps, despite having a ramp and lift available at home. She prefers to use the steps, which led to the fall. Initially, the wounds exuded a juice-like substance, which was thought to be part of the healing process. Recently, the wounds appeared greenish when cleaned, which was concerning to her caregiver.  Her medical history is significant for kidney disease, diagnosed approximately four to five years ago. She declined dialysis due to her age. She also has a history of circulation problems. Additionally, she has arthritis in her hip, necessitating the use of a wheelchair for mobility.  She lives with her caregiver, Edsel, and she has been managing her care together. She has a ramp and lift installed at home to aid with mobility, but she has been using the steps, which led to her fall. She has been sleeping more since her hospital stay, which may contribute to pressure on the wounds.  Review of Systems  Objective:  Physical Exam  Vitals:   02/20/24 1513  BP: 120/70  Pulse: 68  Temp: 97.7 F (36.5 C)  TempSrc: Oral  SpO2: 92%  Height: 5' 6 (1.676 m)    Assessment and Plan Assessment &  Plan Chronic non-healing leg ulcers   The ulcers are likely due to poor circulation and end stage renal disease. They are painful with weight application and occasionally itchy, but show no signs of infection. The greenish color indicates new cell growth, though circulation issues are delaying healing. Apply Vaseline and wrap ulcers with gauze. Change dressings daily or every other day. Consider referral to a wound center for advanced care.  End stage renal disease   This condition contributes to poor circulation and delayed wound healing. She declined dialysis. Hospice care was discussed as an option for additional support.  Osteoarthritis of hip   This condition contributes to mobility issues. She uses a wheelchair and requires assistance due to arthritis and leg ulcers. Continue current mobility aids and assistance as needed.

## 2024-02-20 NOTE — Patient Instructions (Addendum)
 The wounds look okay and likely the kidney disease is causing this to not heal well and may not heal at all.  Let us  know if you are interested in hospice for a resource if things change.2

## 2024-02-22 DIAGNOSIS — L97919 Non-pressure chronic ulcer of unspecified part of right lower leg with unspecified severity: Secondary | ICD-10-CM | POA: Insufficient documentation

## 2024-02-22 NOTE — Assessment & Plan Note (Signed)
 They prefer to continue supportive care without aggressive intervention. Her husband is having a harder time caring for her recently and I discussed hospice as an option she would qualify for. They wished to think about this.

## 2024-10-16 ENCOUNTER — Ambulatory Visit
